# Patient Record
Sex: Male | Born: 1955 | Race: White | Hispanic: No
Health system: Southern US, Community
[De-identification: ages and names within clinical notes are randomized; demographics above are authoritative.]

## PROBLEM LIST (undated history)

## (undated) DIAGNOSIS — N189 Chronic kidney disease, unspecified: Secondary | ICD-10-CM

## (undated) DIAGNOSIS — I10 Essential (primary) hypertension: Secondary | ICD-10-CM

## (undated) DIAGNOSIS — I8390 Asymptomatic varicose veins of unspecified lower extremity: Secondary | ICD-10-CM

## (undated) DIAGNOSIS — N529 Male erectile dysfunction, unspecified: Secondary | ICD-10-CM

## (undated) DIAGNOSIS — I2510 Atherosclerotic heart disease of native coronary artery without angina pectoris: Secondary | ICD-10-CM

## (undated) DIAGNOSIS — I219 Acute myocardial infarction, unspecified: Secondary | ICD-10-CM

## (undated) DIAGNOSIS — I214 Non-ST elevation (NSTEMI) myocardial infarction: Secondary | ICD-10-CM

## (undated) DIAGNOSIS — I5022 Chronic systolic (congestive) heart failure: Secondary | ICD-10-CM

## (undated) DIAGNOSIS — J40 Bronchitis, not specified as acute or chronic: Secondary | ICD-10-CM

## (undated) DIAGNOSIS — H9190 Unspecified hearing loss, unspecified ear: Secondary | ICD-10-CM

## (undated) DIAGNOSIS — I499 Cardiac arrhythmia, unspecified: Secondary | ICD-10-CM

## (undated) DIAGNOSIS — E7800 Pure hypercholesterolemia, unspecified: Secondary | ICD-10-CM

## (undated) DIAGNOSIS — Z87442 Personal history of urinary calculi: Secondary | ICD-10-CM

## (undated) DIAGNOSIS — Z720 Tobacco use: Secondary | ICD-10-CM

## (undated) DIAGNOSIS — G4730 Sleep apnea, unspecified: Secondary | ICD-10-CM

## (undated) DIAGNOSIS — I255 Ischemic cardiomyopathy: Secondary | ICD-10-CM

## (undated) DIAGNOSIS — N200 Calculus of kidney: Secondary | ICD-10-CM

## (undated) HISTORY — PX: CORONARY STENT PLACEMENT: SHX1402

## (undated) HISTORY — PX: CORONARY ANGIOPLASTY: SHX604

---

## 2004-05-15 ENCOUNTER — Emergency Department: Payer: Self-pay | Admitting: Emergency Medicine

## 2004-05-16 ENCOUNTER — Emergency Department: Payer: Self-pay | Admitting: Emergency Medicine

## 2004-12-04 ENCOUNTER — Ambulatory Visit: Payer: Self-pay

## 2007-03-01 ENCOUNTER — Ambulatory Visit: Payer: Self-pay | Admitting: Internal Medicine

## 2007-03-02 ENCOUNTER — Emergency Department: Payer: Self-pay | Admitting: Emergency Medicine

## 2007-07-03 ENCOUNTER — Ambulatory Visit: Payer: Self-pay | Admitting: Family Medicine

## 2008-06-12 ENCOUNTER — Ambulatory Visit: Payer: Self-pay | Admitting: Family Medicine

## 2009-08-05 ENCOUNTER — Ambulatory Visit: Payer: Self-pay | Admitting: Internal Medicine

## 2011-07-02 DIAGNOSIS — I2510 Atherosclerotic heart disease of native coronary artery without angina pectoris: Secondary | ICD-10-CM

## 2011-07-02 HISTORY — DX: Atherosclerotic heart disease of native coronary artery without angina pectoris: I25.10

## 2012-06-19 ENCOUNTER — Encounter (HOSPITAL_COMMUNITY): Payer: Self-pay | Admitting: Pulmonary Disease

## 2012-06-19 ENCOUNTER — Emergency Department (HOSPITAL_COMMUNITY): Payer: BC Managed Care – PPO

## 2012-06-19 ENCOUNTER — Encounter (HOSPITAL_COMMUNITY)
Admission: EM | Disposition: A | Discharge status: Home / Self Care | Payer: Self-pay | Source: Home / Self Care | Attending: Pulmonary Disease

## 2012-06-19 ENCOUNTER — Inpatient Hospital Stay (HOSPITAL_COMMUNITY)
Admission: EM | Admit: 2012-06-19 | Discharge: 2012-06-28 | DRG: 549 | Disposition: A | Discharge status: Home / Self Care | Payer: BC Managed Care – PPO | Attending: Interventional Cardiology | Admitting: Interventional Cardiology

## 2012-06-19 DIAGNOSIS — I8390 Asymptomatic varicose veins of unspecified lower extremity: Secondary | ICD-10-CM | POA: Diagnosis present

## 2012-06-19 DIAGNOSIS — Z720 Tobacco use: Secondary | ICD-10-CM

## 2012-06-19 DIAGNOSIS — F17200 Nicotine dependence, unspecified, uncomplicated: Secondary | ICD-10-CM

## 2012-06-19 DIAGNOSIS — I469 Cardiac arrest, cause unspecified: Secondary | ICD-10-CM | POA: Diagnosis present

## 2012-06-19 DIAGNOSIS — I252 Old myocardial infarction: Secondary | ICD-10-CM | POA: Diagnosis present

## 2012-06-19 DIAGNOSIS — I5021 Acute systolic (congestive) heart failure: Secondary | ICD-10-CM

## 2012-06-19 DIAGNOSIS — I255 Ischemic cardiomyopathy: Secondary | ICD-10-CM

## 2012-06-19 DIAGNOSIS — J9600 Acute respiratory failure, unspecified whether with hypoxia or hypercapnia: Secondary | ICD-10-CM | POA: Diagnosis present

## 2012-06-19 DIAGNOSIS — Z955 Presence of coronary angioplasty implant and graft: Secondary | ICD-10-CM

## 2012-06-19 DIAGNOSIS — I5022 Chronic systolic (congestive) heart failure: Secondary | ICD-10-CM | POA: Diagnosis present

## 2012-06-19 DIAGNOSIS — I219 Acute myocardial infarction, unspecified: Secondary | ICD-10-CM

## 2012-06-19 DIAGNOSIS — R570 Cardiogenic shock: Secondary | ICD-10-CM

## 2012-06-19 DIAGNOSIS — I4901 Ventricular fibrillation: Secondary | ICD-10-CM

## 2012-06-19 DIAGNOSIS — D72829 Elevated white blood cell count, unspecified: Secondary | ICD-10-CM

## 2012-06-19 DIAGNOSIS — I2589 Other forms of chronic ischemic heart disease: Secondary | ICD-10-CM | POA: Diagnosis present

## 2012-06-19 DIAGNOSIS — G931 Anoxic brain damage, not elsewhere classified: Secondary | ICD-10-CM | POA: Diagnosis present

## 2012-06-19 DIAGNOSIS — I420 Dilated cardiomyopathy: Secondary | ICD-10-CM

## 2012-06-19 DIAGNOSIS — I213 ST elevation (STEMI) myocardial infarction of unspecified site: Secondary | ICD-10-CM

## 2012-06-19 DIAGNOSIS — G9340 Encephalopathy, unspecified: Secondary | ICD-10-CM

## 2012-06-19 DIAGNOSIS — J209 Acute bronchitis, unspecified: Secondary | ICD-10-CM | POA: Diagnosis present

## 2012-06-19 DIAGNOSIS — D696 Thrombocytopenia, unspecified: Secondary | ICD-10-CM | POA: Diagnosis present

## 2012-06-19 DIAGNOSIS — E876 Hypokalemia: Secondary | ICD-10-CM | POA: Diagnosis not present

## 2012-06-19 DIAGNOSIS — I2109 ST elevation (STEMI) myocardial infarction involving other coronary artery of anterior wall: Secondary | ICD-10-CM

## 2012-06-19 HISTORY — DX: Acute myocardial infarction, unspecified: I21.9

## 2012-06-19 HISTORY — PX: LEFT HEART CATHETERIZATION WITH CORONARY ANGIOGRAM: SHX5451

## 2012-06-19 HISTORY — PX: PERCUTANEOUS CORONARY STENT INTERVENTION (PCI-S): SHX5485

## 2012-06-19 HISTORY — DX: Asymptomatic varicose veins of unspecified lower extremity: I83.90

## 2012-06-19 LAB — BASIC METABOLIC PANEL
BUN: 18 mg/dL (ref 6–23)
BUN: 16 mg/dL (ref 6–23)
BUN: 16 mg/dL (ref 6–23)
BUN: 17 mg/dL (ref 6–23)
BUN: 17 mg/dL (ref 6–23)
BUN: 19 mg/dL (ref 6–23)
BUN: 19 mg/dL (ref 6–23)
CO2: 22 mEq/L (ref 19–32)
CO2: 22 mEq/L (ref 19–32)
CO2: 22 mEq/L (ref 19–32)
CO2: 19 mEq/L (ref 19–32)
CO2: 21 mEq/L (ref 19–32)
CO2: 22 mEq/L (ref 19–32)
CO2: 22 mEq/L (ref 19–32)
Calcium: 8.4 mg/dL (ref 8.4–10.5)
Calcium: 8.4 mg/dL (ref 8.4–10.5)
Calcium: 8.5 mg/dL (ref 8.4–10.5)
Calcium: 8.4 mg/dL (ref 8.4–10.5)
Calcium: 8.2 mg/dL — ABNORMAL LOW (ref 8.4–10.5)
Calcium: 8.8 mg/dL (ref 8.4–10.5)
Calcium: 8.6 mg/dL (ref 8.4–10.5)
Chloride: 104 mEq/L (ref 96–112)
Chloride: 106 mEq/L (ref 96–112)
Chloride: 107 mEq/L (ref 96–112)
Chloride: 106 mEq/L (ref 96–112)
Chloride: 105 mEq/L (ref 96–112)
Chloride: 106 mEq/L (ref 96–112)
Chloride: 108 mEq/L (ref 96–112)
Creatinine, Ser: 1.01 mg/dL (ref 0.50–1.35)
Creatinine, Ser: 0.85 mg/dL (ref 0.50–1.35)
Creatinine, Ser: 0.81 mg/dL (ref 0.50–1.35)
Creatinine, Ser: 0.87 mg/dL (ref 0.50–1.35)
Creatinine, Ser: 0.89 mg/dL (ref 0.50–1.35)
Creatinine, Ser: 0.99 mg/dL (ref 0.50–1.35)
Creatinine, Ser: 0.97 mg/dL (ref 0.50–1.35)
GFR calc Af Amer: >90 mL/min (ref >90)
GFR calc Af Amer: >90 mL/min (ref >90)
GFR calc Af Amer: >90 mL/min (ref >90)
GFR calc Af Amer: >90 mL/min (ref >90)
GFR calc Af Amer: >90 mL/min (ref >90)
GFR calc Af Amer: >90 mL/min (ref >90)
GFR calc Af Amer: >90 mL/min (ref >90)
GFR calc non Af Amer: 82 mL/min — ABNORMAL LOW (ref >90)
GFR calc non Af Amer: >90 mL/min (ref >90)
GFR calc non Af Amer: >90 mL/min (ref >90)
GFR calc non Af Amer: >90 mL/min (ref >90)
GFR calc non Af Amer: >90 mL/min (ref >90)
GFR calc non Af Amer: >90 mL/min (ref >90)
GFR calc non Af Amer: >90 mL/min (ref >90)
Glucose, Bld: 221 mg/dL — ABNORMAL HIGH (ref 70–99)
Glucose, Bld: 169 mg/dL — ABNORMAL HIGH (ref 70–99)
Glucose, Bld: 165 mg/dL — ABNORMAL HIGH (ref 70–99)
Glucose, Bld: 163 mg/dL — ABNORMAL HIGH (ref 70–99)
Glucose, Bld: 203 mg/dL — ABNORMAL HIGH (ref 70–99)
Glucose, Bld: 193 mg/dL — ABNORMAL HIGH (ref 70–99)
Glucose, Bld: 168 mg/dL — ABNORMAL HIGH (ref 70–99)
Potassium: 3.3 mEq/L — ABNORMAL LOW (ref 3.5–5.1)
Potassium: 3.7 mEq/L (ref 3.5–5.1)
Potassium: 4.1 mEq/L (ref 3.5–5.1)
Potassium: 5.1 mEq/L (ref 3.5–5.1)
Potassium: 3.7 mEq/L (ref 3.5–5.1)
Potassium: 3.5 mEq/L (ref 3.5–5.1)
Potassium: 3.4 mEq/L — ABNORMAL LOW (ref 3.5–5.1)
Sodium: 141 mEq/L (ref 135–145)
Sodium: 142 mEq/L (ref 135–145)
Sodium: 141 mEq/L (ref 135–145)
Sodium: 139 mEq/L (ref 135–145)
Sodium: 141 mEq/L (ref 135–145)
Sodium: 140 mEq/L (ref 135–145)
Sodium: 142 mEq/L (ref 135–145)

## 2012-06-19 LAB — COMPREHENSIVE METABOLIC PANEL
ALT: 81 U/L — ABNORMAL HIGH (ref 0–53)
AST: 94 U/L — ABNORMAL HIGH (ref 0–37)
Albumin: 3.9 g/dL (ref 3.5–5.2)
Alkaline Phosphatase: 82 U/L (ref 39–117)
BUN: 18 mg/dL (ref 6–23)
CO2: 18 mEq/L — ABNORMAL LOW (ref 19–32)
Calcium: 9.6 mg/dL (ref 8.4–10.5)
Chloride: 99 mEq/L (ref 96–112)
Creatinine, Ser: 1.09 mg/dL (ref 0.50–1.35)
GFR calc Af Amer: 87 mL/min — ABNORMAL LOW (ref >90)
GFR calc non Af Amer: 75 mL/min — ABNORMAL LOW (ref >90)
Glucose, Bld: 225 mg/dL — ABNORMAL HIGH (ref 70–99)
Potassium: 2.1 mEq/L — CL (ref 3.5–5.1)
Sodium: 141 mEq/L (ref 135–145)
Total Bilirubin: 0.3 mg/dL (ref 0.3–1.2)
Total Protein: 6.8 g/dL (ref 6.0–8.3)

## 2012-06-19 LAB — POCT I-STAT 3, ART BLOOD GAS (G3+)
Acid-base deficit: 12 mmol/L — ABNORMAL HIGH (ref 0.0–2.0)
Bicarbonate: 18 mEq/L — ABNORMAL LOW (ref 20.0–24.0)
O2 Saturation: 97 %
TCO2: 20 mmol/L (ref 0–100)
pCO2 arterial: 53.5 mmHg — ABNORMAL HIGH (ref 35.0–45.0)
pH, Arterial: 7.134 — CL (ref 7.350–7.450)
pO2, Arterial: 117 mmHg — ABNORMAL HIGH (ref 80.0–100.0)

## 2012-06-19 LAB — GLUCOSE, CAPILLARY
Glucose-Capillary: 155 mg/dL — ABNORMAL HIGH (ref 70–99)
Glucose-Capillary: 157 mg/dL — ABNORMAL HIGH (ref 70–99)
Glucose-Capillary: 158 mg/dL — ABNORMAL HIGH (ref 70–99)
Glucose-Capillary: 159 mg/dL — ABNORMAL HIGH (ref 70–99)
Glucose-Capillary: 163 mg/dL — ABNORMAL HIGH (ref 70–99)
Glucose-Capillary: 165 mg/dL — ABNORMAL HIGH (ref 70–99)
Glucose-Capillary: 169 mg/dL — ABNORMAL HIGH (ref 70–99)
Glucose-Capillary: 177 mg/dL — ABNORMAL HIGH (ref 70–99)
Glucose-Capillary: 179 mg/dL — ABNORMAL HIGH (ref 70–99)
Glucose-Capillary: 184 mg/dL — ABNORMAL HIGH (ref 70–99)
Glucose-Capillary: 189 mg/dL — ABNORMAL HIGH (ref 70–99)
Glucose-Capillary: 201 mg/dL — ABNORMAL HIGH (ref 70–99)
Glucose-Capillary: 204 mg/dL — ABNORMAL HIGH (ref 70–99)

## 2012-06-19 LAB — CBC WITH DIFFERENTIAL/PLATELET
Basophils Absolute: 0.2 10*3/uL — ABNORMAL HIGH (ref 0.0–0.1)
Basophils Relative: 1 % (ref 0–1)
Eosinophils Absolute: 0.2 10*3/uL (ref 0.0–0.7)
Eosinophils Relative: 1 % (ref 0–5)
HCT: 44.5 % (ref 39.0–52.0)
Hemoglobin: 15.2 g/dL (ref 13.0–17.0)
Lymphocytes Relative: 28 % (ref 12–46)
Lymphs Abs: 6.7 10*3/uL — ABNORMAL HIGH (ref 0.7–4.0)
MCH: 32.1 pg (ref 26.0–34.0)
MCHC: 34.2 g/dL (ref 30.0–36.0)
MCV: 93.9 fL (ref 78.0–100.0)
Monocytes Absolute: 1 10*3/uL (ref 0.1–1.0)
Monocytes Relative: 4 % (ref 3–12)
Neutro Abs: 15.9 10*3/uL — ABNORMAL HIGH (ref 1.7–7.7)
Neutrophils Relative %: 66 % (ref 43–77)
Platelets: 231 10*3/uL (ref 150–400)
RBC: 4.74 MIL/uL (ref 4.22–5.81)
RDW: 13.3 % (ref 11.5–15.5)
WBC: 24 10*3/uL — ABNORMAL HIGH (ref 4.0–10.5)

## 2012-06-19 LAB — CBC
HCT: 45.6 % (ref 39.0–52.0)
Hemoglobin: 16 g/dL (ref 13.0–17.0)
MCH: 32.9 pg (ref 26.0–34.0)
MCHC: 35.1 g/dL (ref 30.0–36.0)
MCV: 93.6 fL (ref 78.0–100.0)
Platelets: 225 10*3/uL (ref 150–400)
RBC: 4.87 MIL/uL (ref 4.22–5.81)
RDW: 13.9 % (ref 11.5–15.5)
WBC: 23.8 10*3/uL — ABNORMAL HIGH (ref 4.0–10.5)

## 2012-06-19 LAB — PROTIME-INR
INR: 0.99 (ref 0.00–1.49)
INR: 1.79 — ABNORMAL HIGH (ref 0.00–1.49)
INR: 1.06 (ref 0.00–1.49)
Prothrombin Time: 13 seconds (ref 11.6–15.2)
Prothrombin Time: 20.2 seconds — ABNORMAL HIGH (ref 11.6–15.2)
Prothrombin Time: 13.7 seconds (ref 11.6–15.2)

## 2012-06-19 LAB — POCT I-STAT, CHEM 8
BUN: 23 mg/dL (ref 6–23)
BUN: 19 mg/dL (ref 6–23)
Calcium, Ion: 1.16 mmol/L (ref 1.12–1.23)
Calcium, Ion: 1.08 mmol/L — ABNORMAL LOW (ref 1.12–1.23)
Chloride: 106 mEq/L (ref 96–112)
Chloride: 90 mEq/L — ABNORMAL LOW (ref 96–112)
Creatinine, Ser: 1.1 mg/dL (ref 0.50–1.35)
Creatinine, Ser: 0.7 mg/dL (ref 0.50–1.35)
Glucose, Bld: 204 mg/dL — ABNORMAL HIGH (ref 70–99)
Glucose, Bld: 194 mg/dL — ABNORMAL HIGH (ref 70–99)
HCT: 47 % (ref 39.0–52.0)
HCT: 38 % — ABNORMAL LOW (ref 39.0–52.0)
Hemoglobin: 16 g/dL (ref 13.0–17.0)
Hemoglobin: 12.9 g/dL — ABNORMAL LOW (ref 13.0–17.0)
Potassium: 2.6 mEq/L — CL (ref 3.5–5.1)
Potassium: 2.3 mEq/L — CL (ref 3.5–5.1)
Sodium: 143 mEq/L (ref 135–145)
Sodium: 126 mEq/L — ABNORMAL LOW (ref 135–145)
TCO2: 21 mmol/L (ref 0–100)
TCO2: 18 mmol/L (ref 0–100)

## 2012-06-19 LAB — HEPARIN LEVEL (UNFRACTIONATED)
Heparin Unfractionated: <0.1 IU/mL — ABNORMAL LOW (ref 0.30–0.70)
Heparin Unfractionated: 0.16 IU/mL — ABNORMAL LOW (ref 0.30–0.70)

## 2012-06-19 LAB — APTT
aPTT: 34 seconds (ref 24–37)
aPTT: 106 seconds — ABNORMAL HIGH (ref 24–37)
aPTT: 58 seconds — ABNORMAL HIGH (ref 24–37)

## 2012-06-19 LAB — CK TOTAL AND CKMB (NOT AT ARMC)
CK, MB: 32.3 ng/mL (ref 0.3–4.0)
Relative Index: 7.8 — ABNORMAL HIGH (ref 0.0–2.5)
Total CK: 416 U/L — ABNORMAL HIGH (ref 7–232)

## 2012-06-19 LAB — INFLUENZA PANEL BY PCR (TYPE A & B)
H1N1 flu by pcr: NOT DETECTED
Influenza A By PCR: NEGATIVE
Influenza B By PCR: NEGATIVE

## 2012-06-19 LAB — POCT I-STAT TROPONIN I: Troponin i, poc: 1.02 ng/mL (ref 0.00–0.08)

## 2012-06-19 LAB — PLATELET INHIBITION P2Y12: Platelet Function  P2Y12: 204 [PRU] (ref 194–418)

## 2012-06-19 LAB — MRSA PCR SCREENING: MRSA by PCR: NEGATIVE

## 2012-06-19 LAB — PATHOLOGIST SMEAR REVIEW

## 2012-06-19 LAB — POCT ACTIVATED CLOTTING TIME: Activated Clotting Time: 540 seconds

## 2012-06-19 LAB — TROPONIN I: Troponin I: 3.15 ng/mL (ref <0.30)

## 2012-06-19 SURGERY — LEFT HEART CATHETERIZATION WITH CORONARY ANGIOGRAM
Anesthesia: LOCAL | Site: Groin | Laterality: Right

## 2012-06-19 MED ORDER — ONDANSETRON HCL 4 MG/2ML IJ SOLN: 4.0000 mg | Freq: Four times a day (QID) | INTRAMUSCULAR | Status: DC | PRN

## 2012-06-19 MED ORDER — LIDOCAINE HCL (PF) 1 % IJ SOLN
INTRAMUSCULAR | Status: AC
  Filled 2012-06-19: qty 30

## 2012-06-19 MED ORDER — HEPARIN (PORCINE) IN NACL 2-0.9 UNIT/ML-% IJ SOLN
INTRAMUSCULAR | Status: AC
  Filled 2012-06-19: qty 500

## 2012-06-19 MED ORDER — FENTANYL BOLUS VIA INFUSION
50.0000 ug | INTRAVENOUS | Status: DC | PRN
  Administered 2012-06-19: 50 ug via INTRAVENOUS
  Administered 2012-06-19: 100 ug via INTRAVENOUS
  Filled 2012-06-19: qty 50

## 2012-06-19 MED ORDER — SODIUM CHLORIDE 0.9 % IV SOLN: 2000.0000 mL | Freq: Once | INTRAVENOUS | Status: DC

## 2012-06-19 MED ORDER — FENTANYL BOLUS VIA INFUSION
50.0000 ug | INTRAVENOUS | Status: DC | PRN
  Filled 2012-06-19: qty 50

## 2012-06-19 MED ORDER — SODIUM BICARBONATE 8.4 % IV SOLN
INTRAVENOUS | Status: AC
  Filled 2012-06-19: qty 50

## 2012-06-19 MED ORDER — INSULIN REGULAR HUMAN 100 UNIT/ML IJ SOLN
INTRAMUSCULAR | Status: DC
  Filled 2012-06-19: qty 1

## 2012-06-19 MED ORDER — ASPIRIN 81 MG PO CHEW
CHEWABLE_TABLET | ORAL | Status: AC
  Filled 2012-06-19: qty 4

## 2012-06-19 MED ORDER — SODIUM CHLORIDE 0.9 % IV SOLN
1.0000 mg/h | INTRAVENOUS | Status: DC
  Administered 2012-06-19: 2 mg/h via INTRAVENOUS
  Filled 2012-06-19: qty 10

## 2012-06-19 MED ORDER — LIDOCAINE HCL (CARDIAC) 20 MG/ML IV SOLN
INTRAVENOUS | Status: AC
  Filled 2012-06-19: qty 5

## 2012-06-19 MED ORDER — FENTANYL CITRATE 0.05 MG/ML IJ SOLN
100.0000 ug | Freq: Once | INTRAMUSCULAR | Status: AC
  Administered 2012-06-19: 100 ug via INTRAVENOUS
  Filled 2012-06-19: qty 2

## 2012-06-19 MED ORDER — HEPARIN SODIUM (PORCINE) 5000 UNIT/ML IJ SOLN
4000.0000 [IU] | Freq: Once | INTRAMUSCULAR | Status: AC
  Administered 2012-06-19: 4000 [IU] via INTRAVENOUS
  Filled 2012-06-19: qty 1

## 2012-06-19 MED ORDER — POTASSIUM CHLORIDE 10 MEQ/50ML IV SOLN
10.0000 meq | INTRAVENOUS | Status: AC
  Administered 2012-06-19 (×2): 10 meq via INTRAVENOUS
  Filled 2012-06-19: qty 100
  Filled 2012-06-19 (×3): qty 50

## 2012-06-19 MED ORDER — SUCCINYLCHOLINE CHLORIDE 20 MG/ML IJ SOLN
INTRAMUSCULAR | Status: AC
  Filled 2012-06-19: qty 1

## 2012-06-19 MED ORDER — NITROGLYCERIN 0.2 MG/ML ON CALL CATH LAB
INTRAVENOUS | Status: AC
  Filled 2012-06-19: qty 1

## 2012-06-19 MED ORDER — AMIODARONE HCL 150 MG/3ML IV SOLN
150.0000 mg | Freq: Once | INTRAVENOUS | Status: AC
  Administered 2012-06-19: 150 mg via INTRAVENOUS
  Filled 2012-06-19: qty 3

## 2012-06-19 MED ORDER — ETOMIDATE 2 MG/ML IV SOLN
INTRAVENOUS | Status: AC | PRN
  Administered 2012-06-19: 20 mg via INTRAVENOUS

## 2012-06-19 MED ORDER — INSULIN ASPART 100 UNIT/ML ~~LOC~~ SOLN
2.0000 [IU] | SUBCUTANEOUS | Status: DC
  Administered 2012-06-19 (×4): 4 [IU] via SUBCUTANEOUS
  Administered 2012-06-20 – 2012-06-22 (×4): 2 [IU] via SUBCUTANEOUS

## 2012-06-19 MED ORDER — BIVALIRUDIN 250 MG IV SOLR
INTRAVENOUS | Status: AC
  Filled 2012-06-19: qty 250

## 2012-06-19 MED ORDER — CISATRACURIUM BOLUS VIA INFUSION
0.0500 mg/kg | Freq: Once | INTRAVENOUS | Status: AC | PRN
  Filled 2012-06-19: qty 4

## 2012-06-19 MED ORDER — ARTIFICIAL TEARS OP OINT
1.0000 "application " | TOPICAL_OINTMENT | Freq: Three times a day (TID) | OPHTHALMIC | Status: DC
  Filled 2012-06-19: qty 3.5

## 2012-06-19 MED ORDER — NOREPINEPHRINE BITARTRATE 1 MG/ML IJ SOLN: 2.0000 ug/min | INTRAVENOUS | Status: DC

## 2012-06-19 MED ORDER — MIDAZOLAM BOLUS VIA INFUSION
2.0000 mg | INTRAVENOUS | Status: DC | PRN
  Filled 2012-06-19: qty 2

## 2012-06-19 MED ORDER — METOPROLOL TARTRATE 12.5 MG HALF TABLET
12.5000 mg | ORAL_TABLET | Freq: Two times a day (BID) | ORAL | Status: DC
  Administered 2012-06-19: 12.5 mg via NASOGASTRIC
  Filled 2012-06-19 (×4): qty 1

## 2012-06-19 MED ORDER — FENTANYL CITRATE 0.05 MG/ML IJ SOLN: 100.0000 ug | INTRAMUSCULAR | Status: DC | PRN

## 2012-06-19 MED ORDER — POTASSIUM CHLORIDE 10 MEQ/50ML IV SOLN
10.0000 meq | INTRAVENOUS | Status: AC
  Administered 2012-06-19 – 2012-06-20 (×3): 10 meq via INTRAVENOUS
  Filled 2012-06-19: qty 100

## 2012-06-19 MED ORDER — EPTIFIBATIDE BOLUS VIA INFUSION
180.0000 ug/kg | Freq: Once | INTRAVENOUS | Status: AC
  Administered 2012-06-19: 16100 ug via INTRAVENOUS
  Filled 2012-06-19: qty 22

## 2012-06-19 MED ORDER — CLOPIDOGREL BISULFATE 75 MG PO TABS
75.0000 mg | ORAL_TABLET | Freq: Every day | ORAL | Status: DC
  Administered 2012-06-20 – 2012-06-28 (×9): 75 mg via ORAL
  Filled 2012-06-19 (×12): qty 1

## 2012-06-19 MED ORDER — CHLORHEXIDINE GLUCONATE 0.12 % MT SOLN
15.0000 mL | Freq: Two times a day (BID) | OROMUCOSAL | Status: DC
  Administered 2012-06-19 – 2012-06-22 (×8): 15 mL via OROMUCOSAL
  Filled 2012-06-19 (×7): qty 15

## 2012-06-19 MED ORDER — MIDAZOLAM HCL 5 MG/ML IJ SOLN: 2.0000 mg | Freq: Once | INTRAMUSCULAR | Status: DC

## 2012-06-19 MED ORDER — SODIUM CHLORIDE 0.9 % IV SOLN
0.2500 mg/kg/h | INTRAVENOUS | Status: AC
  Administered 2012-06-19: 0.25 mg/kg/h via INTRAVENOUS
  Filled 2012-06-19 (×2): qty 250

## 2012-06-19 MED ORDER — FENTANYL CITRATE 0.05 MG/ML IJ SOLN: 100.0000 ug | Freq: Once | INTRAMUSCULAR | Status: DC

## 2012-06-19 MED ORDER — IPRATROPIUM-ALBUTEROL 18-103 MCG/ACT IN AERO
6.0000 | INHALATION_SPRAY | RESPIRATORY_TRACT | Status: DC
  Administered 2012-06-19 – 2012-06-23 (×22): 6 via RESPIRATORY_TRACT
  Filled 2012-06-19: qty 14.7

## 2012-06-19 MED ORDER — EPTIFIBATIDE 75 MG/100ML IV SOLN
2.0000 ug/kg/min | INTRAVENOUS | Status: DC
  Administered 2012-06-19: 2 ug/kg/min via INTRAVENOUS
  Filled 2012-06-19: qty 100

## 2012-06-19 MED ORDER — SODIUM CHLORIDE 0.9 % IV SOLN
INTRAVENOUS | Status: AC
  Administered 2012-06-19: 23:00:00 via INTRAVENOUS

## 2012-06-19 MED ORDER — DOPAMINE-DEXTROSE 3.2-5 MG/ML-% IV SOLN
5.0000 ug/kg/min | INTRAVENOUS | Status: DC
  Administered 2012-06-19: 2 ug/kg/min via INTRAVENOUS
  Administered 2012-06-19: 4 ug/kg/min via INTRAVENOUS
  Administered 2012-06-19 – 2012-06-20 (×3): 2 ug/kg/min via INTRAVENOUS

## 2012-06-19 MED ORDER — SODIUM CHLORIDE 0.9 % IV SOLN
1.0000 ug/kg/min | INTRAVENOUS | Status: DC
  Administered 2012-06-19: 0.5 ug/kg/min via INTRAVENOUS
  Filled 2012-06-19 (×2): qty 20

## 2012-06-19 MED ORDER — ROCURONIUM BROMIDE 50 MG/5ML IV SOLN
INTRAVENOUS | Status: AC
  Filled 2012-06-19: qty 2

## 2012-06-19 MED ORDER — MIDAZOLAM HCL 5 MG/ML IJ SOLN: 2.0000 mg | Freq: Once | INTRAMUSCULAR | Status: DC | PRN

## 2012-06-19 MED ORDER — NOREPINEPHRINE BITARTRATE 1 MG/ML IJ SOLN
0.5000 ug/min | INTRAVENOUS | Status: DC
  Administered 2012-06-19: 30 ug/min via INTRAVENOUS
  Administered 2012-06-19: 26 ug/min via INTRAVENOUS
  Administered 2012-06-20 – 2012-06-21 (×4): 30 ug/min via INTRAVENOUS
  Administered 2012-06-21: 25 ug/min via INTRAVENOUS
  Administered 2012-06-21: 34 ug/min via INTRAVENOUS
  Administered 2012-06-22: 18 ug/min via INTRAVENOUS
  Filled 2012-06-19 (×10): qty 16

## 2012-06-19 MED ORDER — ETOMIDATE 2 MG/ML IV SOLN
INTRAVENOUS | Status: AC
  Filled 2012-06-19: qty 20

## 2012-06-19 MED ORDER — EPINEPHRINE HCL 0.1 MG/ML IJ SOLN
INTRAMUSCULAR | Status: AC | PRN
  Administered 2012-06-19 (×2): 1 via INTRAVENOUS

## 2012-06-19 MED ORDER — CISATRACURIUM BOLUS VIA INFUSION
0.1000 mg/kg | Freq: Once | INTRAVENOUS | Status: DC
  Filled 2012-06-19: qty 8

## 2012-06-19 MED ORDER — CISATRACURIUM BOLUS VIA INFUSION: 0.1000 mg/kg | Freq: Once | INTRAVENOUS | Status: DC

## 2012-06-19 MED ORDER — SODIUM CHLORIDE 0.9 % IV SOLN
25.0000 ug/h | INTRAVENOUS | Status: DC
  Filled 2012-06-19: qty 50

## 2012-06-19 MED ORDER — SODIUM CHLORIDE 0.9 % IV SOLN
1.0000 mg/h | INTRAVENOUS | Status: DC
  Administered 2012-06-19: 6 mg/h via INTRAVENOUS
  Administered 2012-06-19: 4 mg/h via INTRAVENOUS
  Administered 2012-06-19 (×2): 6 mg/h via INTRAVENOUS
  Filled 2012-06-19 (×7): qty 10

## 2012-06-19 MED ORDER — MIDAZOLAM HCL 2 MG/2ML IJ SOLN
2.0000 mg | Freq: Once | INTRAMUSCULAR | Status: AC
  Administered 2012-06-19: 2 mg via INTRAVENOUS

## 2012-06-19 MED ORDER — SUCCINYLCHOLINE CHLORIDE 20 MG/ML IJ SOLN
INTRAMUSCULAR | Status: AC | PRN
  Administered 2012-06-19: 120 mg via INTRAVENOUS

## 2012-06-19 MED ORDER — POTASSIUM CHLORIDE 10 MEQ/50ML IV SOLN
10.0000 meq | INTRAVENOUS | Status: AC
  Administered 2012-06-19: 10 meq via INTRAVENOUS
  Filled 2012-06-19 (×2): qty 50

## 2012-06-19 MED ORDER — DOPAMINE-DEXTROSE 3.2-5 MG/ML-% IV SOLN
INTRAVENOUS | Status: AC
  Filled 2012-06-19: qty 250

## 2012-06-19 MED ORDER — ASPIRIN EC 325 MG PO TBEC
325.0000 mg | DELAYED_RELEASE_TABLET | Freq: Every day | ORAL | Status: DC
  Administered 2012-06-20 – 2012-06-22 (×3): 325 mg via ORAL
  Filled 2012-06-19 (×4): qty 1

## 2012-06-19 MED ORDER — SODIUM CHLORIDE 0.9 % IV SOLN
2000.0000 mL | Freq: Once | INTRAVENOUS | Status: AC
  Administered 2012-06-19: 2000 mL via INTRAVENOUS

## 2012-06-19 MED ORDER — ASPIRIN 300 MG RE SUPP: 300.0000 mg | RECTAL | Status: AC

## 2012-06-19 MED ORDER — ACETAMINOPHEN 325 MG PO TABS
650.0000 mg | ORAL_TABLET | ORAL | Status: DC | PRN
  Administered 2012-06-21 (×2): 650 mg via ORAL
  Filled 2012-06-19 (×2): qty 2

## 2012-06-19 MED ORDER — BIOTENE DRY MOUTH MT LIQD
15.0000 mL | Freq: Four times a day (QID) | OROMUCOSAL | Status: DC
  Administered 2012-06-19 – 2012-06-24 (×19): 15 mL via OROMUCOSAL

## 2012-06-19 MED ORDER — MIDAZOLAM BOLUS VIA INFUSION
2.0000 mg | INTRAVENOUS | Status: DC | PRN
  Administered 2012-06-19 (×3): 2 mg via INTRAVENOUS
  Filled 2012-06-19 (×2): qty 2

## 2012-06-19 MED ORDER — ASPIRIN 300 MG RE SUPP: 300.0000 mg | RECTAL | Status: DC

## 2012-06-19 MED ORDER — FAMOTIDINE IN NACL 20-0.9 MG/50ML-% IV SOLN
20.0000 mg | Freq: Two times a day (BID) | INTRAVENOUS | Status: DC
  Administered 2012-06-19 – 2012-06-24 (×11): 20 mg via INTRAVENOUS
  Filled 2012-06-19 (×13): qty 50

## 2012-06-19 MED ORDER — HEPARIN (PORCINE) IN NACL 2-0.9 UNIT/ML-% IJ SOLN
INTRAMUSCULAR | Status: AC
  Filled 2012-06-19: qty 1000

## 2012-06-19 MED ORDER — AMIODARONE HCL 150 MG/3ML IV SOLN
INTRAVENOUS | Status: AC
  Filled 2012-06-19: qty 3

## 2012-06-19 MED ORDER — IPRATROPIUM-ALBUTEROL 18-103 MCG/ACT IN AERO: 6.0000 | INHALATION_SPRAY | RESPIRATORY_TRACT | Status: DC | PRN

## 2012-06-19 MED ORDER — CLOPIDOGREL BISULFATE 300 MG PO TABS
ORAL_TABLET | ORAL | Status: AC
  Filled 2012-06-19: qty 2

## 2012-06-19 MED ORDER — CISATRACURIUM BOLUS VIA INFUSION: 0.0500 mg/kg | Freq: Once | INTRAVENOUS | Status: DC | PRN

## 2012-06-19 MED ORDER — MIDAZOLAM HCL 2 MG/2ML IJ SOLN
INTRAMUSCULAR | Status: AC
  Filled 2012-06-19: qty 2

## 2012-06-19 MED ORDER — DEXTROSE 10 % IV SOLN: INTRAVENOUS | Status: DC | PRN

## 2012-06-19 MED ORDER — FENTANYL CITRATE 0.05 MG/ML IJ SOLN: 100.0000 ug | Freq: Once | INTRAMUSCULAR | Status: DC | PRN

## 2012-06-19 MED ORDER — SODIUM CHLORIDE 0.9 % IV SOLN
25.0000 ug/h | INTRAVENOUS | Status: DC
  Administered 2012-06-19: 50 ug/h via INTRAVENOUS
  Administered 2012-06-20 (×2): 200 ug/h via INTRAVENOUS
  Filled 2012-06-19 (×4): qty 50

## 2012-06-19 MED ORDER — ARTIFICIAL TEARS OP OINT
TOPICAL_OINTMENT | Freq: Three times a day (TID) | OPHTHALMIC | Status: DC
  Administered 2012-06-19: 13:00:00 via OPHTHALMIC
  Administered 2012-06-19: 1 via OPHTHALMIC
  Administered 2012-06-20 (×3): via OPHTHALMIC
  Filled 2012-06-19: qty 3.5

## 2012-06-19 MED ORDER — MIDAZOLAM HCL 2 MG/2ML IJ SOLN
INTRAMUSCULAR | Status: AC
  Administered 2012-06-19: 2 mg via INTRAVENOUS
  Filled 2012-06-19: qty 4

## 2012-06-19 MED ORDER — HEPARIN (PORCINE) IN NACL 100-0.45 UNIT/ML-% IJ SOLN
1100.0000 [IU]/h | INTRAMUSCULAR | Status: DC
  Administered 2012-06-19: 1000 [IU]/h via INTRAVENOUS
  Administered 2012-06-19: 800 [IU]/h via INTRAVENOUS
  Administered 2012-06-20: 1100 [IU]/h via INTRAVENOUS
  Filled 2012-06-19 (×4): qty 250

## 2012-06-19 MED ORDER — MIDAZOLAM HCL 2 MG/2ML IJ SOLN
2.0000 mg | Freq: Once | INTRAMUSCULAR | Status: AC | PRN
  Administered 2012-06-19: 2 mg via INTRAVENOUS

## 2012-06-19 MED ORDER — POTASSIUM CHLORIDE 20 MEQ/15ML (10%) PO LIQD
40.0000 meq | Freq: Once | ORAL | Status: AC
  Administered 2012-06-19: 40 meq
  Filled 2012-06-19: qty 30

## 2012-06-19 MED ORDER — SODIUM CHLORIDE 0.9 % IV SOLN: 1.0000 ug/kg/min | INTRAVENOUS | Status: DC

## 2012-06-19 NOTE — Progress Notes (Signed)
Pt unresponsive, no family available to obtain medical history at this time

## 2012-06-19 NOTE — Progress Notes (Signed)
ANTICOAGULATION CONSULT NOTE - Follow Up Consult  Pharmacy Consult for heparin Indication: STEMI/IABP  Labs:  Basename 06/19/12 2159 06/19/12 1825 06/19/12 1425 06/19/12 1200 06/19/12 0645 06/19/12 0505 06/19/12 0345 06/19/12 0339 06/19/12 0338  HGB 16.0 -- -- -- -- 12.9* -- -- --  HCT 45.6 -- -- -- -- 38.0* 47.0 -- --  PLT 225 -- -- -- -- -- -- -- 231  APTT -- -- -- 58* 106* -- -- -- 34  LABPROT -- -- -- 13.7 20.2* -- -- -- 13.0  INR -- -- -- 1.06 1.79* -- -- -- 0.99  HEPARINUNFRC 0.16* -- <0.10* -- -- -- -- -- --  CREATININE -- 0.99 0.89 0.87 -- -- -- -- --  CKTOTAL -- -- -- -- -- -- -- 416* --  CKMB -- -- -- -- -- -- -- 32.3* --  TROPONINI -- -- -- -- -- -- -- 3.15* --     Assessment: 56yo male remains subtherapeutic on heparin after rate increase with low goal due to IABP; noted that pt was bleeding steadily from groin which has resolved since Integrilin has been off; Hgb back up to baseline 16.  Goal of Therapy:  Heparin level 0.2-0.5 units/ml   Plan:  Will increase heparin gtt by 1 unit/kg/hr to 1100 units/hr and check level with am labs.  Colleen Can PharmD BCPS 06/19/2012,10:56 PM

## 2012-06-19 NOTE — Progress Notes (Signed)
ANTICOAGULATION CONSULT NOTE - Initial Consult  Pharmacy Consult for heparin Indication: STEMI s/p balloon pump insertion  No Known Allergies  Patient Measurements: Weight: 209 lb 7 oz (95 kg)  Vital Signs: Temp: 97.7 F (36.5 C) (12/20 0415) BP: 88/63 mmHg (12/20 0415) Pulse Rate: 104  (12/20 0415)  Labs:  Basename 06/19/12 0345 06/19/12 0339 06/19/12 0338  HGB 16.0 -- 15.2  HCT 47.0 -- 44.5  PLT -- -- 231  APTT -- -- 34  LABPROT -- -- 13.0  INR -- -- 0.99  HEPARINUNFRC -- -- --  CREATININE 1.10 -- 1.09  CKTOTAL -- 416* --  CKMB -- 32.3* --  TROPONINI -- 3.15* --    Medical History: Past Medical History  Diagnosis Date  . Varicose veins     Assessment: 56yo male c/o CP x1d, called EMS overnight, then had witnessed Vfib arrest with EMS present requiring shocks and CPR, STEMI seen prior to arrest, sent to cath lab after stabilized and started on hypothermia protocol in ED, balloon pump placed, to begin heparin after Angiomax gtt completed.  Goal of Therapy:  Heparin level 0.2-0.5 units/ml Monitor platelets by anticoagulation protocol: Yes   Plan:  Will begin heparin gtt at 800 units/hr and monitor heparin levels and CBC.  Colleen Can PharmD BCPS 06/19/2012,6:17 AM

## 2012-06-19 NOTE — Progress Notes (Signed)
ANTICOAGULATION CONSULT NOTE - Follow Up Consult  Pharmacy Consult for Heparin / Integrilin  Indication: STEMI / hypothermia / balloon pump  No Known Allergies  Patient Measurements: Height: 5\' 9"  (175.3 cm) Weight: 196 lb 13.9 oz (89.3 kg) IBW/kg (Calculated) : 70.7    Vital Signs: Temp: 92.3 F (33.5 C) (12/20 1500) BP: 97/72 mmHg (12/20 1500) Pulse Rate: 69  (12/20 1500)  Labs:  Basename 06/19/12 1200 06/19/12 1019 06/19/12 0841 06/19/12 0645 06/19/12 0345 06/19/12 0339 06/19/12 0338  HGB -- -- -- -- 16.0 -- 15.2  HCT -- -- -- -- 47.0 -- 44.5  PLT -- -- -- -- -- -- 231  APTT 58* -- -- 106* -- -- 34  LABPROT 13.7 -- -- 20.2* -- -- 13.0  INR 1.06 -- -- 1.79* -- -- 0.99  HEPARINUNFRC -- -- -- -- -- -- --  CREATININE 0.87 0.81 0.85 -- -- -- --  CKTOTAL -- -- -- -- -- 416* --  CKMB -- -- -- -- -- 32.3* --  TROPONINI -- -- -- -- -- 3.15* --    Estimated Creatinine Clearance: 106 ml/min (by C-G formula based on Cr of 0.87).   Assessment: 56yom with 1 day hx CP EMS called then had witnessed VT arrest - now hypothermia protocol - to begin rewarming 12/21 at 1100.  STEMI had thrombus extraction and  BMS in LAD. Concern for clot burden so will begin Integrilin bolus and drip.   Heparin drip to prevent clotting with balloon pump placed in cath lab.   Heparin drip 800 uts/hr, heparin level < 0.1 less than goal, no bleeding noted, last cbc stable. Goal of Therapy:  Heparin level 0.3-0.5 units/ml while on Integrilin Monitor platelets by anticoagulation protocol: Yes   Plan:  Increase heparin drip 1000 uts/hr  check 6hr heparin level and daily Begin Integrilin single bolus 117mcg/kg iv x1 then  48mcg/kg/min  Check cbc 6hr after start and daily  Marcelino Scot 06/19/2012,3:42 PM

## 2012-06-19 NOTE — ED Provider Notes (Signed)
History     CSN: [id number]  Arrival date & time 06/19/12  6213   First MD Initiated Contact with Patient 06/19/12 0407      Chief Complaint  Patient presents with  . Code STEMI  . Cardiac Arrest    (Consider location/radiation/quality/duration/timing/severity/associated sxs/prior treatment) HPI Comments: Mr. Tomasello presents via EMS for evaluation after a witnessed cardiac arrest.  At about 0230 he walked into the bedroom appearing pale and diaphoretic.  He complained of chest pain and then collapsed on the bed.  He was awake and still able to speak but appeared distressed.  EMS arrived he was able to speak to them initially.  They obtained an EKG that demonstrated anterior ST elevations with reciprocal inferior changes.  Prior to loading him into the ambulance, he collapsed and became pulseless.  CPR was initiated.  He is reported to have alternated between an irregular sinus rhythm with pulses, PEA, and ventricular fibrillation.  He was defibrillated 4 times and a king airway was placed.  He has no history of cardiac issues per his fiancee and family.  The history is provided by a relative and the EMS personnel. The history is limited by the condition of the patient (pt obtunded initially).    No past medical history on file.  No past surgical history on file.  No family history on file.  History  Substance Use Topics  . Smoking status: Not on file  . Smokeless tobacco: Not on file  . Alcohol Use: Not on file      Review of Systems  Unable to perform ROS: Intubated    Allergies  Review of patient's allergies indicates no known allergies.  Home Medications  No current outpatient prescriptions on file.  Wt 170 lb (77.111 kg)  Physical Exam  Nursing note and vitals reviewed. Constitutional: He appears well-developed and well-nourished. He appears toxic. He appears distressed. He is intubated.       King airway in place.  Breast delivered via bagging.  HENT:  Head:  Normocephalic and atraumatic.  Right Ear: External ear normal.  Left Ear: External ear normal.  Nose: Nose normal.  Mouth/Throat: Oropharynx is clear and moist. No oropharyngeal exudate.  Eyes: Conjunctivae normal are normal. Right eye exhibits no discharge. Left eye exhibits no discharge. No scleral icterus.       pupils unreactive.  right pupil dilated to 4 mm, left pupil 2mm  Neck: Normal range of motion. No JVD present. No tracheal deviation present. No thyromegaly present.  Cardiovascular:       Initially pt is pulseless.  Note good pulse with chest compressions via the automatic EMS device.  None noted when device is paused.  After return of pulses note irregular tachycardia with distant hear sounds.  Pulmonary/Chest: No accessory muscle usage or stridor. Apnea noted. Not tachypneic. He is intubated. No respiratory distress. He has decreased breath sounds in the right lower field and the left lower field. He has no wheezes. He has rhonchi. He has no rales. He exhibits no mass, no tenderness, no laceration, no crepitus, no edema, no deformity and no retraction.       No noted spontaneous respiratory effort initially.  After return of pulses, noted agonal respirations.  Abdominal: Soft. He exhibits distension (mild). He exhibits no pulsatile liver, no fluid wave, no abdominal bruit, no ascites, no pulsatile midline mass and no mass. Bowel sounds are decreased. There is no tenderness. There is no rigidity, no rebound, no guarding, no CVA tenderness,  no tenderness at McBurney's point and negative Murphy's sign. No hernia.  Musculoskeletal: Normal range of motion. He exhibits no edema and no tenderness.  Lymphadenopathy:    He has no cervical adenopathy.  Neurological: He is unresponsive.  Skin: Skin is warm and dry. No rash noted. No erythema. There is pallor.    ED Course  INTUBATION Date/Time: 06/19/2012 4:26 AM Performed by: Dana Allan T Authorized by: Dana Allan T Consent:  Verbal consent not obtained. The procedure was performed in an emergent situation. Indications: respiratory failure and airway protection Intubation method: direct Patient status: paralyzed (RSI) Preoxygenation: BVM Pretreatment medications: none Sedatives: etomidate Paralytic: succinylcholine Laryngoscope size: Mac 4 Tube size: 8.0 mm Tube type: cuffed Number of attempts: 1 Cricoid pressure: yes Cords visualized: yes Post-procedure assessment: chest rise and ETCO2 monitor Breath sounds: equal Cuff inflated: yes ETT to lip: 22 cm ETT to teeth: 21 cm Tube secured with: ETT holder Chest x-ray interpreted by me and radiologist. Chest x-ray findings: endotracheal tube in appropriate position Patient tolerance: Patient tolerated the procedure well with no immediate complications.  OG placement Date/Time: 06/19/2012 4:27 AM Performed by: Dana Allan T Authorized by: Dana Allan T Consent: The procedure was performed in an emergent situation. Required items: required blood products, implants, devices, and special equipment available Local anesthesia used: no Patient sedated: no Patient tolerance: Patient tolerated the procedure well with no immediate complications. Comments: Tube placed immediately after placement of the ETT.  Placement confirmed by return of gastric contents and by ascultation over the LUQ with delivered air.   (including critical care time)  Labs Reviewed  CBC WITH DIFFERENTIAL - Abnormal; Notable for the following:    WBC 24.0 (*)     All other components within normal limits  POCT I-STAT, CHEM 8 - Abnormal; Notable for the following:    Potassium 2.6 (*)     Glucose, Bld 204 (*)     All other components within normal limits  COMPREHENSIVE METABOLIC PANEL  TROPONIN I  CK TOTAL AND CKMB  PROTIME-INR  APTT  BLOOD GAS, ARTERIAL   No results found.   No diagnosis found.   Date: 06/19/2012 @0341   Rate: 140 bpm  Rhythm: atrial fibrillation  QRS  Axis: indeterminate  Intervals: QT prolonged  ST/T Wave abnormalities: nonspecific ST changes  Conduction Disutrbances:right bundle branch block  Narrative Interpretation:   Old EKG Reviewed: changes noted (p[rehospital EKG demonstrated a sinus rhythm with ST elevations in the anterior leads and inferior depressions.   Date: 06/19/2012 @03463   Rate: 137 bpm  Rhythm: atrial fibrillation  QRS Axis: indeterminate  Intervals:    ST/T Wave abnormalities: ST elevations anteriorly,  Inferior depression  Conduction Disutrbances:nonspecific intraventricular conduction delay  Narrative Interpretation:   Old EKG Reviewed: changes noted        MDM  Pt presents via EMS after a witnessed cardiac arrest.  CPR was continued on arrival and he was defibrillated once. The [facility] airway was removed and a ETT was placed.  After about 10 minutes of CPR and 2 doses of epi, note return of pulses.  He is noted to breath over the ventilator and grimace on placement of the OG tube.  He also grimaced during placement of the central line.  Both the prehospital and EKG obtained after return of pulses demonstrate changes consitent with a STEMI.   He has been intubated and saline, amiodarone, and heparin were administered.  Consult placed with the on-call interventional cardiologist and intensivist.  The intensivist  placed a left IJ TLC.  The cooling (hypothermia) protocol has been initiated.  I discussed his evaluation and condition at length with his family and the intensivist and cardiologist also performed introductions prior to him being taken emergently to the cath lab.   CRITICAL CARE Performed by: Dana Allan T   Total critical care time: 40  Critical care time was exclusive of separately billable procedures and treating other patients.  Critical care was necessary to treat or prevent imminent or life-threatening deterioration.  Critical care was time spent personally by me on the following activities:  development of treatment plan with patient and/or surrogate as well as nursing, discussions with consultants, evaluation of patient's response to treatment, examination of patient, obtaining history from patient or surrogate, ordering and performing treatments and interventions, ordering and review of laboratory studies, ordering and review of radiographic studies, pulse oximetry and re-evaluation of patient's condition.        Tobin Chad, MD 06/19/12 [phone]

## 2012-06-19 NOTE — Progress Notes (Signed)
Left groin with steady blood trickle and saturated dressing noted. Dressing changed. Blood tinged urine noted in foley tubing. Dr. Mayford Knife notified. Order obtained. Integralin off. Will monitor.

## 2012-06-19 NOTE — Procedures (Signed)
Central Venous Catheter Insertion Procedure Note [name] [name] [id number] 1955/08/30  Procedure: Insertion of Central Venous Catheter Indications: Assessment of intravascular volume and Drug and/or fluid administration  Procedure Details Consent: Unable to obtain consent because of emergent medical necessity. Time Out: Verified patient identification, verified procedure, site/side was marked, verified correct patient position, special equipment/implants available, medications/allergies/relevent history reviewed, required imaging and test results available.  Performed  Maximum sterile technique was used including antiseptics, cap, gloves, gown, hand hygiene, mask and sheet. Skin prep: Chlorhexidine; local anesthetic administered A antimicrobial bonded/coated triple lumen catheter was placed in the left internal jugular vein using the Seldinger technique. (Attemped L subclavian but could not cannulate vein.)  Ultrasound was used to verify the patency of the vein and for real time needle guidance.  Evaluation Blood flow good Complications: No apparent complications Patient did tolerate procedure well. Chest X-ray ordered to verify placement.  CXR: normal.  MCQUAID, DOUGLAS 06/19/2012, 4:31 AM

## 2012-06-19 NOTE — H&P (Addendum)
The patient is a 56 year old gentleman who began having chest pain early this morning. EMS was called and upon their arrival the patient had a witnessed ventricular fibrillation arrest. He has been previously healthy. He smokes cigarettes. There are no chronic medical illnesses. He underwent 30 minutes or greater CPR. The initial EKGs revealed hyperacute T wave changes in the precordial leads with reciprocal changes. I spoke to his family and we obtained emergency consent to proceed.  We just found that his troponins are elevated, his potassium is 2.1, hemoglobin is normal.    Drakkar Medeiros is a 56 y.o. male  Admit date: 06/19/2012 Referring Physician: [facility] ED Primary Cardiologist:: HWBSmith, III, MD Chief complaint / reason for admission:  Cardiac arrest in setting of anterior STEMI and post arrest Shock  HPI: The patient is 56 years of age and has had an upper respiratory illness. He developed chest pain at some point last evening or early this morning. According to his girlfriend he came into the bedroom complaining and then collapsed on the bed. She said that he was sweaty and appeared pale. They called 911 and upon arrival EKGs demonstrated an anterior MI. In attempting to transport the patient to the vehicle he suffered a ventricular fibrillation cardiac arrest and CPR was started. In the emergency room he had additional CPR with total CPR time approximately 30 minutes. After obtaining a pulse and stable rhythm the patient was "shocky" with systolic blood pressures less than 80 mmHg. He was intubated and we felt it appropriate to bring the patient to the catheterization laboratory for revascularization and place him on the hypothermia protocol.    PMH:    Past Medical History  Diagnosis Date  . Varicose veins     PSH:   No past surgical history on file.  ALLERGIES:   Review of patient's allergies indicates no known allergies.  Prior to Admit Meds:   No prescriptions prior to admission    Family HX:   No family history on file. Social HX:    History   Social History  . Marital Status: Single    Spouse Name: N/A    Number of Children: N/A  . Years of Education: N/A   Occupational History  . Not on file.   Social History Main Topics  . Smoking status: Current Every Day Smoker    Types: Cigarettes  . Smokeless tobacco: Not on file  . Alcohol Use: No  . Drug Use: No  . Sexually Active: Not on file   Other Topics Concern  . Not on file   Social History Narrative  . No narrative on file     ROS: Upper respiratory illness  Physical Exam: Blood pressure 88/63, pulse 104, temperature 97.7 F (36.5 C), resp. rate 21, weight 95 kg (209 lb 7 oz), SpO2 98.00%.    The patient is intubated. Skin color is relatively good under the circumstances. No obvious limb ischemia.  Chest exam reveals rhonchi and there is obvious pulmonary edema fluid from the endotracheal tube.  Cardiac exam reveals a gallop rhythm. No obvious murmur.  Abdomen is soft without obvious organomegaly.  Extremities reveal no edema and the femorals are 2+ and symmetric. Posterior tibial pulses are trace to 1+ bilaterally.  The neurological exam reveals a patient who is comatose following cardiac arrest and receiving sedation Labs:   Lab Results  Component Value Date   WBC 24.0* 06/19/2012   HGB 16.0 06/19/2012   HCT 47.0 06/19/2012   MCV 93.9  06/19/2012   PLT 231 06/19/2012    Lab 06/19/12 0345 06/19/12 0338  NA 143 --  K 2.6* --  CL 106 --  CO2 -- 18*  BUN 23 --  CREATININE 1.10 --  CALCIUM -- 9.6  PROT -- 6.8  BILITOT -- 0.3  ALKPHOS -- 82  ALT -- 81*  AST -- 94*  GLUCOSE 204* --   Lab Results  Component Value Date   CKTOTAL 416* 06/19/2012   CKMB 32.3* 06/19/2012   TROPONINI 3.15* 06/19/2012      Radiology: PORTABLE CHEST - 1 VIEW  Comparison: None.  Findings: Endotracheal tube tip projects 4.5 cm proximal to the  carina. Left IJ catheter tip projects over the  left  brachiocephalic vein in the SVC confluence. Heart size upper  normal to mildly enlarged. Interstitial prompts / peribronchial  thickening. NG tube descends into the abdomen, tip just below the  GE junction. Side port is just above the GE junction. No pleural  effusion or pneumothorax. No acute osseous finding. Nondisplaced  rib fractures related to resuscitation not excluded.  IMPRESSION:  Endotracheal tube tip 4.5 cm proximal to the carina.  NG tube side port is above the GE junction, therefore advancement  recommend.  Left IJ catheter tip projects over the left brachiocephalic vein  near the SVC confluence. No pneumothorax.  Interstitial prominence and peribronchial thickening may reflect  acute edema or bronchitis versus chronic bronchitic change.  Original Report Authenticated By: Jearld Lesch, M.D.    EKG:  Initial EKG revealed hyperacute T waves V2 through V6 with reciprocal change. Subsequent EKG post arrest demonstrated a right bundle branch block with sinus rhythm and PVCs.  ASSESSMENT:   1. Acute anterior myocardial infarction complicated by ventricular fibrillation cardiac arrest  2. Cardiogenic shock post cardiac arrest  3. Cardiac arrest do to ventricular fibrillation requiring CPR, total time approximately 30 minutes  Plan:  1. Emergency catheterization for the purpose of intra-aortic balloon, pulse sedation and recanalization of the infarct related vessel. Prognosis is poor but the patient is an and potentially salvageable. The high risk of the procedure and his poor prognosis was discussed with the patient's daughter and his fiance. Emergency consent was obtained. Lesleigh Noe 06/19/2012 6:10 AM

## 2012-06-19 NOTE — Progress Notes (Signed)
PULMONARY  / CRITICAL CARE MEDICINE  Name: [name] [name] MRN: [id number] DOB: 1956-06-22    LOS: 0  REFERRING MD :  [facility]  CHIEF COMPLAINT:  Cardiac arrest  BRIEF PATIENT DESCRIPTION: 56 y/o male with no known past medical history presented on 06/19/2012 to the [facility] ED in cardiac arrest in the setting of a STEMI.  Underwent 30 minutes of CPR and taken to the cath lab.  LINES / TUBES: 12/20 ETT >> 12/20 L IJ CVL >>  CULTURES: 12/20 rapid flu >>  ANTIBIOTICS:  SIGNIFICANT EVENTS:   LEVEL OF CARE:  ICU PRIMARY SERVICE:  Cardiology CONSULTANTS:  PCCM CODE STATUS DIET:  NPO DVT Px:  heparin GI Px:  pepcid  HISTORY OF PRESENT ILLNESS:  56 y/o male with no known past medical history presented on 06/19/2012 to the [facility] ED in cardiac arrest in the setting of a STEMI.  Underwent 30 minutes of CPR and taken to the cath lab.  His girlfriend says that he was in his usual state of health on 12/19 and came home from work around 0230 on 12/20 (normal time) but was short of breath and complaining of chest pain.  She called 911 not long after he came home.  He was still conscious when EMS arrived but lost consciousness on the way out of his house so CPR was initiated.  Apparently he stopped on the way home to by Nyquil.    VITAL SIGNS: Temp:  [95.9 F (35.5 C)-97.7 F (36.5 C)] 95.9 F (35.5 C) (12/20 0640) Pulse Rate:  [73-118] 73  (12/20 1000) Resp:  [0-22] 16  (12/20 1000) BP: (84-163)/(63-97) 131/95 mmHg (12/20 0900) SpO2:  [92 %-100 %] 97 % (12/20 1000) Arterial Line BP: (119-150)/(77-84) 119/78 mmHg (12/20 1000) FiO2 (%):  [40 %-100 %] 40 % (12/20 0640) Weight:  [77.111 kg (170 lb)-95 kg (209 lb 7 oz)] 89.3 kg (196 lb 13.9 oz) (12/20 0900) HEMODYNAMICS: CVP:  [8 mmHg] 8 mmHg VENTILATOR SETTINGS: Vent Mode:  [-] PRVC FiO2 (%):  [40 %-100 %] 40 % Set Rate:  [16 bmp-18 bmp] 16 bmp Vt Set:  [450 mL-500 mL] 450 mL PEEP:  [5 cmH20] 5 cmH20 Plateau Pressure:  [14 cmH20-15 cmH20] 15  cmH20 INTAKE / OUTPUT: Intake/Output      12/19 0701 - 12/20 0700 12/20 0701 - 12/21 0700   I.V. (mL/kg) 61.8 (0.7) 118.2 (1.3)   NG/GT 75    IV Piggyback 50    Total Intake(mL/kg) 186.8 (2) 118.2 (1.3)   Urine (mL/kg/hr) 1550 (0.7) 1700   Total Output 1550 1700   Net -1363.3 -1581.8         PHYSICAL EXAMINATION: Gen: cyanotic, on vent HEENT: NCAT, ETT in place PULM: Rhonchi bilaterally CV: Tachy, regular, no mgr AB: BS infrequent, soft, nontender Ext: cool, no edema Neuro: Vigorous cough, no purposeful movements, extensor response to pain; GCS 4  LABS: Cbc  Lab 06/19/12 0345 06/19/12 0338  WBC -- 24.0*  HGB 16.0 15.2  HCT 47.0 44.5  PLT -- 231   Chemistry  Lab 06/19/12 0841 06/19/12 0645 06/19/12 0345 06/19/12 0338  NA 142 141 143 --  K 3.7 3.3* 2.6* --  CL 106 104 106 --  CO2 22 22 -- 18*  BUN 16 18 23  --  CREATININE 0.85 1.01 1.10 --  CALCIUM 8.4 8.4 -- 9.6  MG -- -- -- --  PHOS -- -- -- --  GLUCOSE 169* 221* 204* --   Liver fxn  Lab 06/19/12 0338  AST 94*  ALT 81*  ALKPHOS 82  BILITOT 0.3  PROT 6.8  ALBUMIN 3.9   coags  Lab 06/19/12 0645 06/19/12 0338  APTT 106* 34  INR 1.79* 0.99   Sepsis markers No results found for this basename: LATICACIDVEN:3,PROCALCITON:3 in the last 168 hours Cardiac markers  Lab 06/19/12 0339  CKTOTAL 416*  CKMB 32.3*  TROPONINI 3.15*   BNP No results found for this basename: PROBNP:3 in the last 168 hours ABG  Lab 06/19/12 0345  PHART --  PCO2ART --  PO2ART --  HCO3 --  TCO2 21   CBG trend  Lab 06/19/12 0647  GLUCAP 204*   IMAGING: 12/20 ETT 4.5 cm above carina, CVL in place, possible bronchitis  ECG: Sinus tach, ST elevation in Lateral precordial leads  DIAGNOSES: Principal Problem:  *STEMI (ST elevation myocardial infarction) Active Problems:  Cardiac arrest  Acute respiratory failure  Ventricular fibrillation  Acute encephalopathy  Leukocytosis  Tobacco abuse  Cardiogenic  shock  ASSESSMENT / PLAN:  PULMONARY  ASSESSMENT: 1) Acute respiratory failure due to cardiac arrest 2) Likely bronchitis, secretions from tube, apparently purchased OTC cold drugs just prior to cardiac arrest;  PLAN:   -Full vent support. -ABG/CXR daily. -SBT/WUA after rewarming. -Check rapid flu given bronchitis, pending.  CARDIOVASCULAR  ASSESSMENT:  1) STEMI; major known risk factor for CAD is tobacco abuse 2) Cardiac arrest PLAN:  -Cath lab now. -Induced hypothermia. -If survives, counsel to quit tobacco.  RENAL  ASSESSMENT:   1) Hypokalemia PLAN:   -Replete IV and PO now. -Repeat BMET.  GASTROINTESTINAL  ASSESSMENT:   1) No acute issues PLAN:   -Stress ulcer prophylaxis. -OG tube. -Start TF once warmed.  HEMATOLOGIC  ASSESSMENT:   1) No acute issues PLAN:  -Monitor for bleeding.  INFECTIOUS  ASSESSMENT:   1) Acute bronchitis PLAN:   -Check rapid flu.  ENDOCRINE  ASSESSMENT:   1) no acute issues  PLAN:   -Monitor glucose  NEUROLOGIC  ASSESSMENT:   1) Acute encephalopathy/comatose from cardiac arrest, high risk for anoxic brain injury PLAN:   -Induced hypothermia protocol.  CLINICAL SUMMARY:   I have personally obtained a history, examined the patient, evaluated laboratory and imaging results, formulated the assessment and plan and placed orders.  CRITICAL CARE: The patient is critically ill with multiple organ systems failure and requires high complexity decision making for assessment and support, frequent evaluation and titration of therapies, application of advanced monitoring technologies and extensive interpretation of multiple databases. Critical Care Time devoted to patient care services described in this note is 45 minutes.   Koren Bound, MD Pulmonary and Critical Care Medicine [facility] Pager: [phone]  06/19/2012, 10:37 AM

## 2012-06-19 NOTE — Progress Notes (Signed)
  Echocardiogram 2D Echocardiogram has been performed.  [place]ian Co 06/19/2012, 3:28 PM

## 2012-06-19 NOTE — Care Management Note (Addendum)
    Page 1 of 2   06/26/2012     5:27:24 PM   CARE MANAGEMENT NOTE 06/26/2012  Patient:  [name],[name]   Account Number:  [id number]  Date Initiated:  06/19/2012  Documentation initiated by:  [name] [name]  Subjective/Objective Assessment:   adm w mi     Action/Plan:   lives alone   Anticipated DC Date:  07-22-2012   Anticipated DC Plan:  HOME/SELF CARE      DC Planning Services  CM consult      Choice offered to / List presented to:             Status of service:  In process, will continue to follow Medicare Important Message given?   (If response is "NO", the following Medicare IM given date fields will be blank) Date Medicare IM given:   Date Additional Medicare IM given:    Discharge Disposition:    Per UR Regulation:  Reviewed for med. necessity/level of care/duration of stay  If discussed at Long Length of Stay Meetings, dates discussed:    Comments:  06/26/12 [name] [name] [phone] PT S/P CARDIAC  ARREST WITH ANOXIC BRAIN INJURY; ADM ON 06/19/12.  PTA, PT INDEPENDENT, LIVES WITH GIRLFRIEND, [name] [name].  MET WITH [name], OT [name] [name], AND DR [name] [name] TO DISCUSS PT'S PROGRESS AND CONFIRM SUPPORT AT HOME.  GF IS DISABLED AND STATES CAN BE WITH PT 24/7 IF NEED BE.  SHE ALSO STATES PT'S SON, [name] [name] WILL BE MOVING IN TO ASSIST PT AT DISCHARGE.  RECOMMENDATION IS FOR OUTPT OT, PT, ST, AND NEUROPSYCH EVAL, AS PT STILL HAS EXTENSIVE COGNITIVE DEFICITS.  GF STATES SHE CAN TAKE PT TO APPTS. NEURO REHAB REFERRAL FORM SIGNED BY MD, COMPLETED AND FAXED TO [facility].  [name] SEEMS CAPABLE, YET SOMEWHAT OVERWHELMED REGARDING PT'S CURRENT STATE.  WILL CONT TO FOLLOW PROGRESS/ASSESS FOR NEEDS.  12/23 [name] dowell rn,bsn remains on vent  12/20 1023 [name] dowell rn,bsn [phone]

## 2012-06-19 NOTE — Code Documentation (Signed)
No pulse, chest compressions resumed.

## 2012-06-19 NOTE — Progress Notes (Signed)
The patient is critically ill post cardiac arrest, prolonged CPR, and s/p LAD BMS. He has IABP and is on Levo. His is post Bivalirudin and currently on IV heparin/plavix/IV Integrilin.  Plan: 1. IV integrilin until hypothermia D/C.           2. Wean and D/C IABP Sunday after hypothermia D/C.           3. D/C IV heparin 1-2 hours prior to balloon pull on Sunday           4. Continue NGT ASA and Plavix

## 2012-06-19 NOTE — ED Notes (Signed)
Cardiology, Garnette Scheuermann, at bedside

## 2012-06-19 NOTE — ED Notes (Signed)
Dr. Kendrick Fries at bedside, to insert central line

## 2012-06-19 NOTE — Consult Note (Signed)
PULMONARY  / CRITICAL CARE MEDICINE  Name: [name] [name] MRN: [id number] DOB: 02/14/1956    LOS: 0  REFERRING MD :  [facility]  CHIEF COMPLAINT:  Cardiac arrest  BRIEF PATIENT DESCRIPTION: 56 y/o male with no known past medical history presented on 06/19/2012 to the [facility] ED in cardiac arrest in the setting of a STEMI.  Underwent 30 minutes of CPR and taken to the cath lab.  LINES / TUBES: 12/20 ETT >> 12/20 L IJ CVL >>  CULTURES: 12/20 rapid flu >>  ANTIBIOTICS:   SIGNIFICANT EVENTS:    LEVEL OF CARE:  ICU PRIMARY SERVICE:  Cardiology CONSULTANTS:  PCCM CODE STATUS DIET:  NPO DVT Px:  heparin GI Px:  pepcid  HISTORY OF PRESENT ILLNESS:  56 y/o male with no known past medical history presented on 06/19/2012 to the [facility] ED in cardiac arrest in the setting of a STEMI.  Underwent 30 minutes of CPR and taken to the cath lab.  His girlfriend says that he was in his usual state of health on 12/19 and came home from work around 0230 on 12/20 (normal time) but was short of breath and complaining of chest pain.  She called 911 not long after he came home.  He was still conscious when EMS arrived but lost consciousness on the way out of his house so CPR was initiated.  Apparently he stopped on the way home to by Nyquil.    PAST MEDICAL HISTORY :  Past Medical History  Diagnosis Date  . Varicose veins    No past surgical history on file. Prior to Admission medications   Not on File   No Known Allergies  FAMILY HISTORY:  No family history on file. SOCIAL HISTORY:  reports that he has been smoking Cigarettes.  He does not have any smokeless tobacco history on file. He reports that he does not drink alcohol or use illicit drugs.  REVIEW OF SYSTEMS:  Cannot obtain due to intubation   INTERVAL HISTORY:   VITAL SIGNS: Temp:  [97.7 F (36.5 C)] 97.7 F (36.5 C) (12/20 0415) Pulse Rate:  [100-118] 104  (12/20 0415) Resp:  [18-22] 21  (12/20 0415) BP: (84-145)/(63-97) 88/63 mmHg  (12/20 0415) SpO2:  [95 %-100 %] 98 % (12/20 0415) FiO2 (%):  [100 %] 100 % (12/20 0400) Weight:  [77.111 kg (170 lb)] 77.111 kg (170 lb) (12/20 0300) HEMODYNAMICS:   VENTILATOR SETTINGS: Vent Mode:  [-] PRVC FiO2 (%):  [100 %] 100 % Set Rate:  [18 bmp] 18 bmp Vt Set:  [500 mL] 500 mL PEEP:  [5 cmH20] 5 cmH20 Plateau Pressure:  [14 cmH20] 14 cmH20 INTAKE / OUTPUT: Intake/Output    None     PHYSICAL EXAMINATION: Gen: cyanotic, on vent HEENT: NCAT, ETT in place PULM: Rhonchi bilaterally CV: Tachy, regular, no mgr AB: BS infrequent, soft, nontender Ext: cool, no edema Neuro: Vigorous cough, no purposeful movements, extensor response to pain; GCS 4   LABS: Cbc  Lab 06/19/12 0345 06/19/12 0338  WBC -- 24.0*  HGB 16.0 15.2  HCT 47.0 44.5  PLT -- 231    Chemistry   Lab 06/19/12 0345 06/19/12 0338  NA 143 141  K 2.6* 2.1*  CL 106 99  CO2 -- 18*  BUN 23 18  CREATININE 1.10 1.09  CALCIUM -- 9.6  MG -- --  PHOS -- --  GLUCOSE 204* 225*    Liver fxn  Lab 06/19/12 0338  AST 94*  ALT 81*  ALKPHOS 82  BILITOT 0.3  PROT 6.8  ALBUMIN 3.9   coags  Lab 06/19/12 0338  APTT 34  INR 0.99   Sepsis markers No results found for this basename: LATICACIDVEN:3,PROCALCITON:3 in the last 168 hours Cardiac markers  Lab 06/19/12 0339  CKTOTAL 416*  CKMB 32.3*  TROPONINI 3.15*   BNP No results found for this basename: PROBNP:3 in the last 168 hours ABG  Lab 06/19/12 0345  PHART --  PCO2ART --  PO2ART --  HCO3 --  TCO2 21    CBG trend No results found for this basename: GLUCAP:5 in the last 168 hours  IMAGING: 12/20 ETT 4.5 cm above carina, CVL in place, possible bronchitis  ECG: Sinus tach, ST elevation in Lateral precordial leads  DIAGNOSES: Principal Problem:  *Cardiac arrest Active Problems:  Acute respiratory failure  STEMI (ST elevation myocardial infarction)  Ventricular fibrillation  Acute encephalopathy  Leukocytosis   ASSESSMENT /  PLAN:  PULMONARY  ASSESSMENT: 1) Acute respiratory failure due to cardiac arrest 2) Likely bronchitis, secretions from tube, apparently purchased OTC cold drugs just prior to cardiac arrest;  PLAN:   -Full vent support -abg/cxr daily -SBT/WUA after rewarming -check rapid flu given bronchitis  CARDIOVASCULAR  ASSESSMENT:  1) STEMI; major known risk factor for CAD is tobacco abuse 2) Cardiac arrest PLAN:  -Cath lab now -induced hypothermia -if survives, counsel to quit tobacco  RENAL  ASSESSMENT:   1) Hypokalemia PLAN:   -replete IV and PO now -repeat BMET  GASTROINTESTINAL  ASSESSMENT:   1) No acute issues PLAN:   -stress ulcer prophylaxis -OG tube  HEMATOLOGIC  ASSESSMENT:   1) No acute issues PLAN:  -monitor for bleeding  INFECTIOUS  ASSESSMENT:   1) Acute bronchitis PLAN:   -check rapid flu  ENDOCRINE  ASSESSMENT:   1) no acute issues  PLAN:   -monitor glucose  NEUROLOGIC  ASSESSMENT:   1) Acute encephalopathy/comatose from cardiac arrest, high risk for anoxic brain injury PLAN:   -induced hypothermia protocol  CLINICAL SUMMARY:   I have personally obtained a history, examined the patient, evaluated laboratory and imaging results, formulated the assessment and plan and placed orders. CRITICAL CARE: The patient is critically ill with multiple organ systems failure and requires high complexity decision making for assessment and support, frequent evaluation and titration of therapies, application of advanced monitoring technologies and extensive interpretation of multiple databases. Critical Care Time devoted to patient care services described in this note is 45 minutes.   Max Fickle, MD Pulmonary and Critical Care Medicine [facility] Pager: [phone]  06/19/2012, 4:32 AM

## 2012-06-19 NOTE — Code Documentation (Signed)
Pt has a pulse, sinus tachycardia on the monitor.

## 2012-06-19 NOTE — ED Notes (Signed)
Pt arrived to cath lab, report given to cath lab staff.  [employer] transported with pt, pads were not opened or placed on patient.

## 2012-06-19 NOTE — Procedures (Signed)
Arterial Catheter Insertion Procedure Note [name] [name] [id number] 12/18/1955  Procedure: Insertion of Arterial Catheter  Indications: Blood pressure monitoring  Procedure Details Consent: Unable to obtain consent because of emergent medical necessity. Time Out: Verified patient identification, verified procedure, site/side was marked, verified correct patient position, special equipment/implants available, medications/allergies/relevent history reviewed, required imaging and test results available.  Performed  Maximum sterile technique was used including antiseptics, cap, gloves, gown, hand hygiene, mask and sheet. Skin prep: Chlorhexidine; local anesthetic administered 20 gauge catheter was inserted into right radial artery using the Seldinger technique.  Evaluation Blood flow good; BP tracing good. Complications: No apparent complications.   Joylene John 06/19/2012

## 2012-06-19 NOTE — Progress Notes (Addendum)
INITIAL NUTRITION ASSESSMENT  DOCUMENTATION CODES Per approved criteria  -Not Applicable   INTERVENTION: 1. Once rewarmed, recommend initiation of enteral nutrition if pt to remain intubated and within goals of care. If enteral nutrition desired, recommend initiation of Osmolite 1.2 at 25 ml/hr, advance by 10 ml/hr every 4 hours or as tolerated, to goal of 55 ml/hr. 30 ml Prostat via tube TID. Goal rate will provide: 1884 kcal, 119 grams protein, 1082 ml free water.  2. RD to continue to follow nutrition care plan  NUTRITION DIAGNOSIS: Inadequate oral intake related to inability to eat as evidenced by NPO status.   Goal: Initiate nutrition support if pt to remain intubated >24-48 hours and s/p rewarmed. Intake to meet at least 90% of estimated nutrition needs.  Monitor:  weight trends, lab trends, I/O's, extubation, vent settings and temperature, initiation of nutrition support  Reason for Assessment: VDRF  56 y.o. male  Admitting Dx: STEMI (ST elevation myocardial infarction)  ASSESSMENT: Per chart, pt with chest pain earlier this morning. No past medical hx, does smoke cigarettes. S/p cardiac arrest. Underwent emergency catheterization and placed on hypothermia protocol. Noted pt with poor prognosis per cardiology. Plan to start TF once re-warmed per CCM.  Patient is currently intubated on ventilator support.  MV: 7.2 Temp:Temp (24hrs), Avg:94.5 F (34.7 C), Min:90.7 F (32.6 C), Max:99.3 F (37.4 C)  Height: Ht Readings from Last 1 Encounters:  06/19/12 5\' 9"  (1.753 m)   Weight: Wt Readings from Last 1 Encounters:  06/19/12 196 lb 13.9 oz (89.3 kg)   Ideal Body Weight: 160 lb/72.7 kg  % Ideal Body Weight: 123%  Wt Readings from Last 10 Encounters:  06/19/12 196 lb 13.9 oz (89.3 kg)  06/19/12 196 lb 13.9 oz (89.3 kg)   Usual Body Weight: unable to obtain  % Usual Body Weight: n/a  BMI:  Body mass index is 29.07 kg/(m^2). Pt is overweight.  Estimated  Nutritional Needs: Kcal: 1850 kcal Protein: 110 - 140 grams Fluid: 1.9 - 2.2 liters daily  Skin: intact  Diet Order:    EDUCATION NEEDS: -No education needs identified at this time   Intake/Output Summary (Last 24 hours) at 06/19/12 1323 Last data filed at 06/19/12 1300  Gross per 24 hour  Intake 633.15 ml  Output   3326 ml  Net -2692.85 ml    Last BM: PTA  Labs:   Lab 06/19/12 1200 06/19/12 1019 06/19/12 0841  NA 139 141 142  K 5.1 4.1 3.7  CL 106 107 106  CO2 19 22 22   BUN 17 16 16   CREATININE 0.87 0.81 0.85  CALCIUM 8.4 8.5 8.4  MG -- -- --  PHOS -- -- --  GLUCOSE 163* 165* 169*    CBG (last 3)   Basename 06/19/12 1108 06/19/12 1012 06/19/12 0905  GLUCAP 157* 158* 165*    Scheduled Meds:    . sodium chloride  2,000 mL Intravenous Once  . albuterol-ipratropium  6 puff Inhalation Q4H  . antiseptic oral rinse  15 mL Mouth Rinse QID  . artificial tears   Both Eyes Q8H  . aspirin  300 mg Rectal NOW  . chlorhexidine  15 mL Mouth/Throat BID  . cisatracurium  0.1 mg/kg Intravenous Once  . famotidine (PEPCID) IV  20 mg Intravenous Q12H  . fentaNYL  100 mcg Intravenous Once  . insulin aspart  2-6 Units Subcutaneous Q4H  . lidocaine (cardiac) 100 mg/44ml      . metoprolol tartrate  12.5 mg Per  NG tube BID  . midazolam      . midazolam  2 mg Intravenous Once  . rocuronium      . succinylcholine        Continuous Infusions:    . cisatracurium (NIMBEX) infusion 1 mcg/kg/min (06/19/12 0700)  . dextrose    . DOPamine 4 mcg/kg/min (06/19/12 1313)  . fentaNYL infusion INTRAVENOUS 200 mcg/hr (06/19/12 0734)  . heparin 800 Units/hr (06/19/12 0845)  . insulin (NOVOLIN-R) infusion    . midazolam (VERSED) infusion 6 mg/hr (06/19/12 1315)  . norepinephrine (LEVOPHED) Adult infusion 30 mcg/min (06/19/12 1222)    Past Medical History  Diagnosis Date  . Varicose veins     No past surgical history on file.  Jarold Motto MS, RD, LDN Pager:  [phone] After-hours pager: [phone]

## 2012-06-19 NOTE — ED Notes (Signed)
Per EMS: were called out for CP, diaphoresis, left shoulder pain, and nausea.  Pt does not have any medical history, does not take any medications.  Pt took ASA PTA, STEMI seen before arrest.  As they were taking the pt down the stairs, pt had witnessed arrest.  Slumped over and stopped talking.  V-fib on the monitor.  Began chest compressions and inserted King airway.  Delivered 4 shocks PTA.  Pt was flipping between v-fib and sinus arrythmia for EMS.   Upon arrival to ED: pt was receiving chest compressions, compressions were held and pt was found to have a pulse.

## 2012-06-19 NOTE — CV Procedure (Signed)
     Diagnostic Cardiac Catheterization Report  [name] [name]  56 y.o.  male Jan 19, 1956  Procedure Date:06/19/2012 Referring Physician: Tressie Ellis ED Primary Cardiologist: HWBSmith, III   PROCEDURE:  Left heart catheterization with selective coronary angiography, left ventriculogram.  INDICATIONS:  Cardiogenic shock following prolonged CPR in the setting of anterior ST elevation myocardial infarction.  The risks, benefits, and details of the procedure were explained to the patients family.  Marland Kitchen emergency consent was obtained.  PROCEDURE TECHNIQUE:  After Xylocaine anesthesia a 7.5 French sheath was placed in the  left femoral artery and a 6 French sheath was placed in the right femoral both with a single anterior needle wall sticks.  An IV bolus followed by an infusion of bivalirudin was started. We then advanced an intra-aortic balloon pump into the descending aorta. We positioned the pump fluoroscopically. One-to-one pumping was begun. Coronary angiography was done using a 6 [place] A2 multipurpose catheter.  Left ventriculography was not performed.   An XB LAD 6 [place] guide catheter was then positioned in the left main and a BMW wire was advanced across the total occlusion in the proximal LAD. We use an expressway thrombectomy catheter which reestablished flow in the LAD. The patient had a segmental 95% stenosis after thrombus extraction. A 20 mm long by 3.5 mm diameter Veriflex bare-metal stent was positioned and deployed to 12 atmospheres. Post dilatation with a 15 mm long by 3.75 [place] balloon was performed to 14 atmospheres.  600 mg of Plavix was crushed and administered per NG tube. 324 mgs of aspirin was crushed and administered per NG tube.  Angio-Seal was performed with good hemostasis in the right femoral cath site.   CONTRAST:  Total of 200 cc.  COMPLICATIONS:  None.    HEMODYNAMICS:  Aortic pressure was 75/60 mmHg with an augmented pressure of 90 mm mercury; LV pressure was not  recorded.  ANGIOGRAPHIC DATA:   The left main coronary artery is widely patent..  The left anterior descending artery is totally occluded after the origin of the first diagonal in the proximal segment..  The left circumflex artery is dominant giving origin to 3 obtuse marginal branches and the PDA. Luminal irregularities are noted but no high-grade obstruction is seen..  The right coronary artery is nondominant.  LEFT VENTRICULOGRAM:   not performed  PCI RESULTS:   As noted above the LAD was totally occluded proximally. After bare-metal stenting 0% stenosis was noted in the treatment area post dilated to 3.75 mm in diameter. The segment distal to the stent contains eccentric 40-50% narrowing that I did not believe was flow limiting.  IMPRESSIONS:  1. Acute anterior wall STEMI resulting in ventricular fibrillation cardiac arrest and prolonged CPR.  2. Successful recanalization of the totally occluded proximal LAD 0% stenosis with TIMI grade 3 flow using a bare-metal stent.  3. Widely patent dominant circumflex and nondominant right coronary.  4. Left ventriculography was not performed.  5. Intra-aortic balloon counterpulsation   RECOMMENDATION:  1. Decreased bivalirudin to .25 mg per kilogram per minute.  2. Daily aspirin and Plavix per NG tube  3. Check P2 Y12 level  4. Continue one-to-one intra-aortic balloon pumping for 24 hours.  5. 2-D echocardiogram to assess LV function  6. Hypothermia protocol

## 2012-06-19 NOTE — Progress Notes (Signed)
Chaplain Note:  Chaplain responded immediately to Code STEMI page received at 03:44.  When chaplain arrived in ED, pt was being treated in trauma bay.  Chaplain met with pt's family in family conference room, providing spiritual comfort, support, and prayer for pt and family while pt was in ED and during the cath procedure.  Chaplain supported Cath Lab staff by acting as liaison between pt's family and lab.  After the pt's cath procedure, chaplain escorted family to CICU waiting area and informed CICU staff of family's whereabouts and cell phone numbers.  Pt's family expressed appreciation for chaplain support.  Chaplain will refer this case to chaplain normally assigned to CICU so that spiritual support may be continued.  06/19/12 0600  Clinical Encounter Type  Visited With Patient;Family  Visit Type Spiritual support  Referral From Other (Comment) (Code STEMI page)  Consult/Referral To Chaplain  Recommendations Continue spiritual support  Spiritual Encounters  Spiritual Needs Emotional;Prayer  Stress Factors  Patient Stress Factors Major life changes;Loss of control;Health changes  Family Stress Factors Major life changes;Lack of knowledge   Verdie Shire, Chaplain [phone]

## 2012-06-20 ENCOUNTER — Inpatient Hospital Stay (HOSPITAL_COMMUNITY): Payer: BC Managed Care – PPO

## 2012-06-20 LAB — POCT I-STAT, CHEM 8
BUN: 32 mg/dL — ABNORMAL HIGH (ref 6–23)
BUN: 20 mg/dL (ref 6–23)
Calcium, Ion: 1.2 mmol/L (ref 1.12–1.23)
Calcium, Ion: 1.21 mmol/L (ref 1.12–1.23)
Chloride: 108 mEq/L (ref 96–112)
Chloride: 110 mEq/L (ref 96–112)
Creatinine, Ser: 1 mg/dL (ref 0.50–1.35)
Creatinine, Ser: 0.9 mg/dL (ref 0.50–1.35)
Glucose, Bld: 134 mg/dL — ABNORMAL HIGH (ref 70–99)
Glucose, Bld: 120 mg/dL — ABNORMAL HIGH (ref 70–99)
HCT: 48 % (ref 39.0–52.0)
HCT: 46 % (ref 39.0–52.0)
Hemoglobin: 16.3 g/dL (ref 13.0–17.0)
Hemoglobin: 15.6 g/dL (ref 13.0–17.0)
Potassium: 5.7 mEq/L — ABNORMAL HIGH (ref 3.5–5.1)
Potassium: 4.2 mEq/L (ref 3.5–5.1)
Sodium: 143 mEq/L (ref 135–145)
Sodium: 143 mEq/L (ref 135–145)
TCO2: 24 mmol/L (ref 0–100)
TCO2: 22 mmol/L (ref 0–100)

## 2012-06-20 LAB — HEPARIN LEVEL (UNFRACTIONATED)
Heparin Unfractionated: 0.22 IU/mL — ABNORMAL LOW (ref 0.30–0.70)
Heparin Unfractionated: 0.25 [IU]/mL — ABNORMAL LOW (ref 0.30–0.70)
Heparin Unfractionated: 0.15 IU/mL — ABNORMAL LOW (ref 0.30–0.70)
Heparin Unfractionated: 0.14 IU/mL — ABNORMAL LOW (ref 0.30–0.70)

## 2012-06-20 LAB — CBC
HCT: 46.4 % (ref 39.0–52.0)
Hemoglobin: 15.9 g/dL (ref 13.0–17.0)
MCH: 32.4 pg (ref 26.0–34.0)
MCHC: 34.3 g/dL (ref 30.0–36.0)
MCV: 94.7 fL (ref 78.0–100.0)
Platelets: 222 10*3/uL (ref 150–400)
RBC: 4.9 MIL/uL (ref 4.22–5.81)
RDW: 13.9 % (ref 11.5–15.5)
WBC: 24.5 10*3/uL — ABNORMAL HIGH (ref 4.0–10.5)

## 2012-06-20 LAB — BASIC METABOLIC PANEL
BUN: 20 mg/dL (ref 6–23)
BUN: 20 mg/dL (ref 6–23)
BUN: 20 mg/dL (ref 6–23)
BUN: 18 mg/dL (ref 6–23)
CO2: 22 mEq/L (ref 19–32)
CO2: 21 mEq/L (ref 19–32)
CO2: 21 mEq/L (ref 19–32)
CO2: 20 mEq/L (ref 19–32)
Calcium: 8.4 mg/dL (ref 8.4–10.5)
Calcium: 8.4 mg/dL (ref 8.4–10.5)
Calcium: 8 mg/dL — ABNORMAL LOW (ref 8.4–10.5)
Calcium: 8.4 mg/dL (ref 8.4–10.5)
Chloride: 107 mEq/L (ref 96–112)
Chloride: 106 mEq/L (ref 96–112)
Chloride: 106 mEq/L (ref 96–112)
Chloride: 108 mEq/L (ref 96–112)
Creatinine, Ser: 0.97 mg/dL (ref 0.50–1.35)
Creatinine, Ser: 0.97 mg/dL (ref 0.50–1.35)
Creatinine, Ser: 0.87 mg/dL (ref 0.50–1.35)
Creatinine, Ser: 0.83 mg/dL (ref 0.50–1.35)
GFR calc Af Amer: >90 mL/min (ref >90)
GFR calc Af Amer: >90 mL/min (ref >90)
GFR calc Af Amer: >90 mL/min (ref >90)
GFR calc Af Amer: >90 mL/min (ref >90)
GFR calc non Af Amer: >90 mL/min (ref >90)
GFR calc non Af Amer: >90 mL/min (ref >90)
GFR calc non Af Amer: >90 mL/min (ref >90)
GFR calc non Af Amer: >90 mL/min (ref >90)
Glucose, Bld: 150 mg/dL — ABNORMAL HIGH (ref 70–99)
Glucose, Bld: 138 mg/dL — ABNORMAL HIGH (ref 70–99)
Glucose, Bld: 134 mg/dL — ABNORMAL HIGH (ref 70–99)
Glucose, Bld: 121 mg/dL — ABNORMAL HIGH (ref 70–99)
Potassium: 4.3 mEq/L (ref 3.5–5.1)
Potassium: 4.5 mEq/L (ref 3.5–5.1)
Potassium: >7.5 mEq/L (ref 3.5–5.1)
Potassium: 4 mEq/L (ref 3.5–5.1)
Sodium: 141 mEq/L (ref 135–145)
Sodium: 138 mEq/L (ref 135–145)
Sodium: 136 mEq/L (ref 135–145)
Sodium: 140 mEq/L (ref 135–145)

## 2012-06-20 LAB — POCT I-STAT 3, ART BLOOD GAS (G3+)
Acid-base deficit: 6 mmol/L — ABNORMAL HIGH (ref 0.0–2.0)
Acid-base deficit: 6 mmol/L — ABNORMAL HIGH (ref 0.0–2.0)
Acid-base deficit: 6 mmol/L — ABNORMAL HIGH (ref 0.0–2.0)
Bicarbonate: 23.3 mEq/L (ref 20.0–24.0)
Bicarbonate: 23.1 mEq/L (ref 20.0–24.0)
Bicarbonate: 21.5 mEq/L (ref 20.0–24.0)
O2 Saturation: 94 %
O2 Saturation: 92 %
O2 Saturation: 87 %
Patient temperature: 91.8
Patient temperature: 34
Patient temperature: 34.6
TCO2: 25 mmol/L (ref 0–100)
TCO2: 25 mmol/L (ref 0–100)
TCO2: 23 mmol/L (ref 0–100)
pCO2 arterial: 51.7 mmHg — ABNORMAL HIGH (ref 35.0–45.0)
pCO2 arterial: 51.1 mmHg — ABNORMAL HIGH (ref 35.0–45.0)
pCO2 arterial: 43.9 mmHg (ref 35.0–45.0)
pH, Arterial: 7.24 — ABNORMAL LOW (ref 7.350–7.450)
pH, Arterial: 7.246 — ABNORMAL LOW (ref 7.350–7.450)
pH, Arterial: 7.286 — ABNORMAL LOW (ref 7.350–7.450)
pO2, Arterial: 70 mmHg — ABNORMAL LOW (ref 80.0–100.0)
pO2, Arterial: 65 mmHg — ABNORMAL LOW (ref 80.0–100.0)
pO2, Arterial: 52 mmHg — ABNORMAL LOW (ref 80.0–100.0)

## 2012-06-20 LAB — BASIC METABOLIC PANEL WITH GFR
BUN: 20 mg/dL (ref 6–23)
CO2: 21 meq/L (ref 19–32)
Calcium: 8.2 mg/dL — ABNORMAL LOW (ref 8.4–10.5)
Chloride: 108 meq/L (ref 96–112)
Creatinine, Ser: 0.97 mg/dL (ref 0.50–1.35)
GFR calc Af Amer: >90 mL/min
GFR calc non Af Amer: >90 mL/min
Glucose, Bld: 141 mg/dL — ABNORMAL HIGH (ref 70–99)
Potassium: 4.6 meq/L (ref 3.5–5.1)
Sodium: 140 meq/L (ref 135–145)

## 2012-06-20 LAB — GLUCOSE, CAPILLARY
Glucose-Capillary: 106 mg/dL — ABNORMAL HIGH (ref 70–99)
Glucose-Capillary: 117 mg/dL — ABNORMAL HIGH (ref 70–99)
Glucose-Capillary: 118 mg/dL — ABNORMAL HIGH (ref 70–99)
Glucose-Capillary: 130 mg/dL — ABNORMAL HIGH (ref 70–99)
Glucose-Capillary: 138 mg/dL — ABNORMAL HIGH (ref 70–99)
Glucose-Capillary: 143 mg/dL — ABNORMAL HIGH (ref 70–99)
Glucose-Capillary: 94 mg/dL (ref 70–99)

## 2012-06-20 MED ORDER — HEPARIN (PORCINE) IN NACL 100-0.45 UNIT/ML-% IJ SOLN
1300.0000 [IU]/h | INTRAMUSCULAR | Status: DC
  Filled 2012-06-20: qty 250

## 2012-06-20 MED ORDER — HEPARIN (PORCINE) IN NACL 100-0.45 UNIT/ML-% IJ SOLN
1800.0000 [IU]/h | INTRAMUSCULAR | Status: DC
  Administered 2012-06-21: 1800 [IU]/h via INTRAVENOUS
  Filled 2012-06-20 (×3): qty 250

## 2012-06-20 MED ORDER — SODIUM CHLORIDE 0.9 % IV SOLN: INTRAVENOUS | Status: DC

## 2012-06-20 MED ORDER — SODIUM CHLORIDE 0.9 % IV SOLN
1.0000 ug/kg/min | INTRAVENOUS | Status: DC
  Filled 2012-06-20: qty 20

## 2012-06-20 MED ORDER — SODIUM CHLORIDE 0.9 % IV SOLN
3.0000 g | Freq: Four times a day (QID) | INTRAVENOUS | Status: DC
  Administered 2012-06-20 – 2012-06-21 (×4): 3 g via INTRAVENOUS
  Filled 2012-06-20 (×6): qty 3

## 2012-06-20 NOTE — Progress Notes (Signed)
COAGULATION - Follow Up CONSULT NOTE  Pharmacy Consult for Heparin Indication:  Anticoagulation with IABP in place  No Known Allergies  Patient Measurements: Height: 5\' 9"  (175.3 cm) Weight: 196 lb 13.9 oz (89.3 kg) IBW/kg (Calculated) : 70.7  Heparin Dosing Weight: 88.6 kg  Vital Signs: Temp: 96.3 F (35.7 C) (12/21 2100) Temp src: Temporal (12/21 2100) BP: 109/71 mmHg (12/21 1720) Pulse Rate: 95  (12/21 2100)  Labs:  Basename 06/20/12 2100 06/20/12 1815 06/20/12 1500 06/20/12 1240 06/20/12 1215 06/20/12 1000 06/20/12 0506 06/19/12 2159 06/19/12 1200 06/19/12 0645 06/19/12 0339 06/19/12 0338  HGB -- 15.6 -- -- 16.3 -- -- -- -- -- -- --  HCT -- 46.0 -- -- 48.0 -- 46.4 -- -- -- -- --  PLT -- -- -- -- -- -- 222 225 -- -- -- 231  APTT -- -- -- -- -- -- -- -- 58* 106* -- 34  LABPROT -- -- -- -- -- -- -- -- 13.7 20.2* -- 13.0  INR -- -- -- -- -- -- -- -- 1.06 1.79* -- 0.99  HEPARINUNFRC 0.14* -- 0.15* -- -- 0.25* -- -- -- -- -- --  CREATININE -- 0.90 -- 0.97 1.00 -- -- -- -- -- -- --  CKTOTAL -- -- -- -- -- -- -- -- -- -- 416* --  CKMB -- -- -- -- -- -- -- -- -- -- 32.3* --  TROPONINI -- -- -- -- -- -- -- -- -- -- 3.15* --    Estimated Creatinine Clearance: 102.4 ml/min (by C-G formula based on Cr of 0.9).   Medications:  Infusions:     . [EXPIRED] sodium chloride Stopped (06/20/12 0643)  . sodium chloride 20 mL/hr at 06/20/12 2000  . cisatracurium (NIMBEX) infusion 1.2 mcg/kg/min (06/20/12 2100)  . DOPamine 5 mcg/kg/min (06/20/12 2032)  . fentaNYL infusion INTRAVENOUS 200 mcg/hr (06/20/12 1549)  . heparin 1,300 Units/hr (06/20/12 1650)  . midazolam (VERSED) infusion 6 mg/hr (06/19/12 2143)  . norepinephrine (LEVOPHED) Adult infusion 30 mcg/min (06/20/12 1213)  . [DISCONTINUED] cisatracurium (NIMBEX) infusion 1.383 mcg/kg/min (06/20/12 2004)  . [DISCONTINUED] heparin 1,100 Units/hr (06/20/12 1059)    Assessment: 55 y.o. M who presented to the [facility] on 12/20, had  witness VFib arrest, STEMI, emergent cath with BMS placed in the LAD along with IABP and stabilized on the hypothermia protocol.  The patient also continues on heparin for anticoagulation with the IABP still in place. Heparin level is below goal after an increase to 1300 units/hr during rewarming phase (HL 0.14, goal of 0.2-0.5).   Goal of Therapy:  Heparin Level 0.2-0.5 units/ml (with IABP in place) Monitor platelets by anticoagulation protocol: Yes   Plan: HL=0.14 -Increase heparin to 1500 units/hr -Heparin level in 4 hours (with next BMET)  Harland German, Pharm D 06/20/2012 9:43 PM

## 2012-06-20 NOTE — Progress Notes (Addendum)
PULMONARY  / CRITICAL CARE MEDICINE  Name: [name] Walker MRN: [id number] DOB: 10/12/1955    LOS: 1  REFERRING MD :  [facility] ED[facility]  CHIEF COMPLAINT:  Cardiac arrest  BRIEF PATIENT DESCRIPTION: 56 y/o male smoker with no known past medical history presented on 06/19/2012 to the [facility] ED in cardiac arrest in the setting of AW- STEMI.  Underwent 30 minutes of CPR and s/p  successful PCI of proximal occluded LAD.  Cardiogenic shock requiring pressors and IABP    LINES / TUBES: 12/20 ETT >> 12/20 L IJ CVL >>  CULTURES: 12/20 rapid flu >>neg  ANTIBIOTICS:  SIGNIFICANT EVENTS:   LEVEL OF CARE:  ICU PRIMARY SERVICE:  Cardiology CONSULTANTS:  PCCM CODE STATUS DIET:  NPO DVT Px:  heparin GI Px:  pepcid   VITAL SIGNS: Temp:  [90.5 F (32.5 C)-93 F (33.9 C)] 91.8 F (33.2 C) (12/21 0900) Pulse Rate:  [53-85] 70  (12/21 0903) Resp:  [0-16] 16  (12/21 0903) BP: (78-138)/(50-101) 138/83 mmHg (12/21 0700) SpO2:  [94 %-100 %] 100 % (12/21 0903) Arterial Line BP: (84-138)/(54-92) 109/67 mmHg (12/21 0900) FiO2 (%):  [40 %] 40 % (12/21 0903) Weight:  [89.3 kg (196 lb 13.9 oz)] 89.3 kg (196 lb 13.9 oz) (12/20 2000) HEMODYNAMICS: CVP:  [10 mmHg-15 mmHg] 14 mmHg VENTILATOR SETTINGS: Vent Mode:  [-] PRVC FiO2 (%):  [40 %] 40 % Set Rate:  [16 bmp] 16 bmp Vt Set:  [450 mL] 450 mL PEEP:  [5 cmH20] 5 cmH20 Plateau Pressure:  [15 cmH20-16 cmH20] 16 cmH20 INTAKE / OUTPUT: Intake/Output      12/20 0701 - 12/21 0700 12/21 0701 - 12/22 0700   I.V. (mL/kg) 2844.3 (31.9) 178.9 (2)   NG/GT  15   IV Piggyback 250    Total Intake(mL/kg) 3094.3 (34.7) 193.9 (2.2)   Urine (mL/kg/hr) 2269 (1.1) 39   Emesis/NG output 250 0   Total Output 2519 39   Net +575.3 +154.9         PHYSICAL EXAMINATION: Gen: sedated , paralysed, appears older than age, , on vent HEENT: NCAT, ETT in place PULM: BS bilaterally equal CV: Tachy, regular, no mgr AB: BS infrequent, soft, nontender Ext: cool, no  edema Neuro: sedated , paralysed,  LABS: Cbc  Lab 06/20/12 0506 06/19/12 2159 06/19/12 0505 06/19/12 0338  WBC 24.5* -- -- --  HGB 15.9 16.0 12.9* --  HCT 46.4 45.6 38.0* --  PLT 222 225 -- 231   Chemistry  Lab 06/20/12 0506 06/20/12 0210 06/19/12 2159  NA 138 141 142  K 4.5 4.3 3.4*  CL 106 107 108  CO2 21 22 22   BUN 20 20 19   CREATININE 0.97 0.97 0.97  CALCIUM 8.4 8.4 8.6  MG -- -- --  PHOS -- -- --  GLUCOSE 138* 150* 168*   Liver fxn  Lab 06/19/12 0338  AST 94*  ALT 81*  ALKPHOS 82  BILITOT 0.3  PROT 6.8  ALBUMIN 3.9   coags  Lab 06/19/12 1200 06/19/12 0645 06/19/12 0338  APTT 58* 106* 34  INR 1.06 1.79* 0.99   Sepsis markers No results found for this basename: LATICACIDVEN:3,PROCALCITON:3 in the last 168 hours Cardiac markers  Lab 06/19/12 0339  CKTOTAL 416*  CKMB 32.3*  TROPONINI 3.15*   BNP No results found for this basename: PROBNP:3 in the last 168 hours ABG  Lab 06/19/12 0516 06/19/12 0505 06/19/12 0345  PHART 7.134* -- --  PCO2ART 53.5* -- --  PO2ART 117.0* -- --  HCO3 18.0* -- --  TCO2 20 18 21    CBG trend  Lab 06/20/12 0755 06/20/12 0601 06/20/12 0357 06/20/12 0149 06/19/12 2348  GLUCAP 130* 118* 117* 138* 155*   IMAGING: 12/20 ETT 4.5 cm above carina, CVL in place, possible bronchitis  ECG: Sinus tach, ST elevation in Lateral precordial leads  DIAGNOSES: Principal Problem:  *STEMI (ST elevation myocardial infarction) Active Problems:  Cardiac arrest  Acute respiratory failure  Ventricular fibrillation  Acute encephalopathy  Leukocytosis  Tobacco abuse  Cardiogenic shock  ASSESSMENT / PLAN:  PULMONARY  ASSESSMENT: 1) Acute respiratory failure due to cardiac arrest 2) Likely bronchitis, secretions from tube, apparently purchased OTC cold drugs just prior to cardiac arrest;  PLAN:   -Full vent support. -ABG/CXR daily. -SBT/WUA after rewarming.   CARDIOVASCULAR  ASSESSMENT:  1) STEMI; major known risk factor  for CAD is tobacco abuse 2) Cardiac arrest 3) cardiogenic shock on IABP & pressors PLAN:  -Induced hypothermia. -wean IABP once on lower pressors - await echo   RENAL  ASSESSMENT:  Oliguria 1) Hypokalemia PLAN:   -Repeat BMET.  GASTROINTESTINAL  ASSESSMENT:   1) No acute issues PLAN:   -Stress ulcer prophylaxis. -OG tube. -Start TF once warmed.  HEMATOLOGIC  ASSESSMENT:   1) No acute issues PLAN:  -Monitor for bleeding.  INFECTIOUS  ASSESSMENT:   1) Acute bronchitis PLAN:   Empiric unasyn - leucocytosis, low threshold to stop  ENDOCRINE  ASSESSMENT:   1) no acute issues  PLAN:   -Monitor glucose  NEUROLOGIC  ASSESSMENT:   1) Acute encephalopathy/comatose from cardiac arrest, high risk for anoxic brain injury PLAN:   -Induced hypothermia protocol - rewarm by 11 pm tonight  CLINICAL SUMMARY:   I have personally obtained a history, examined the patient, evaluated laboratory and imaging results, formulated the assessment and plan and placed orders.  CRITICAL CARE: The patient is critically ill with multiple organ systems failure and requires high complexity decision making for assessment and support, frequent evaluation and titration of therapies, application of advanced monitoring technologies and extensive interpretation of multiple databases. Critical Care Time devoted to patient care services described in this note is 40 minutes.   Oretha Milch., MD Pulmonary and Critical Care Medicine [facility] Pager: [phone]  06/20/2012, 9:09 AM

## 2012-06-20 NOTE — Progress Notes (Signed)
COAGULATION - Follow Up CONSULT NOTE  Pharmacy Consult for Heparin Indication:  Anticoagulation with IABP in place  No Known Allergies  Patient Measurements: Height: 5\' 9"  (175.3 cm) Weight: 196 lb 13.9 oz (89.3 kg) IBW/kg (Calculated) : 70.7  Heparin Dosing Weight: 88.6 kg  Vital Signs: Temp: 93.2 F (34 C) (12/21 1604) Temp src: Core (Comment) (12/21 0600) BP: 111/67 mmHg (12/21 1252) Pulse Rate: 86  (12/21 1552)  Labs:  Basename 06/20/12 1500 06/20/12 1240 06/20/12 1215 06/20/12 1105 06/20/12 1000 06/20/12 0506 06/19/12 2159 06/19/12 1200 06/19/12 0645 06/19/12 0339 06/19/12 0338  HGB -- -- 16.3 -- -- 15.9 -- -- -- -- --  HCT -- -- 48.0 -- -- 46.4 45.6 -- -- -- --  PLT -- -- -- -- -- 222 225 -- -- -- 231  APTT -- -- -- -- -- -- -- 58* 106* -- 34  LABPROT -- -- -- -- -- -- -- 13.7 20.2* -- 13.0  INR -- -- -- -- -- -- -- 1.06 1.79* -- 0.99  HEPARINUNFRC 0.15* -- -- -- 0.25* 0.22* -- -- -- -- --  CREATININE -- 0.97 1.00 0.87 -- -- -- -- -- -- --  CKTOTAL -- -- -- -- -- -- -- -- -- 416* --  CKMB -- -- -- -- -- -- -- -- -- 32.3* --  TROPONINI -- -- -- -- -- -- -- -- -- 3.15* --    Estimated Creatinine Clearance: 95.1 ml/min (by C-G formula based on Cr of 0.97).   Medications:  Infusions:     . [EXPIRED] sodium chloride Stopped (06/20/12 0643)  . sodium chloride 30 mL/hr at 06/20/12 0700  . cisatracurium (NIMBEX) infusion 1.167 mcg/kg/min (06/20/12 0800)  . DOPamine 3 mcg/kg/min (06/20/12 1525)  . fentaNYL infusion INTRAVENOUS 200 mcg/hr (06/20/12 1549)  . heparin 1,100 Units/hr (06/20/12 1059)  . midazolam (VERSED) infusion 6 mg/hr (06/19/12 2143)  . norepinephrine (LEVOPHED) Adult infusion 30 mcg/min (06/20/12 1213)  . [DISCONTINUED] eptifibatide Stopped (06/19/12 1943)    Assessment: 56 y.o. M who presented to the [facility] on 12/20, had witness VFib arrest, STEMI, emergent cath with BMS placed in the LAD along with IABP and stabilized on the hypothermia  protocol.  The patient also continues on heparin for anticoagulation with the IABP still in place. Heparin level is below goal during rewarming phase (HL 0.15, goal of 0.2-0.5).   Goal of Therapy:  Heparin Level 0.2-0.5 units/ml (with IABP in place) Monitor platelets by anticoagulation protocol: Yes   Plan:  -Increase heparin to 1300 units/hr -Heparin level in 4 hours  Harland German, Pharm D 06/20/2012 4:44 PM

## 2012-06-20 NOTE — Progress Notes (Signed)
SUBJECTIVE:  Intubated and sedated on hypothermia protocol  OBJECTIVE:   Vitals:   Filed Vitals:   06/20/12 0500 06/20/12 0600 06/20/12 0643 06/20/12 0700  BP: 138/97   138/83  Pulse: 71 63  72  Temp: 90.9 F (32.7 C) 90.5 F (32.5 C)  91.2 F (32.9 C)  TempSrc: Core (Comment) Core (Comment)    Resp: 11 0 6 10  Height:      Weight:      SpO2: 100% 100%  100%   I&O's:   Intake/Output Summary (Last 24 hours) at 06/20/12 0802 Last data filed at 06/20/12 0700  Gross per 24 hour  Intake 3027.42 ml  Output   1269 ml  Net 1758.42 ml   TELEMETRY: Reviewed telemetry pt in NSR:  PHYSICAL EXAM General: Well developed, well nourished, in no acute distress Head: Eyes PERRLA, No xanthomas.   Normal cephalic and atramatic  Lungs:   Clear bilaterally to auscultation and percussion. Heart:   HRRR S1 S2 Pulses are 2+ & equal. Abdomen: Bowel sounds are positive, abdomen soft and non-tender without masses Extremities:   No clubbing, cyanosis or edema.  DP +1   LABS: Basic Metabolic Panel:  Basename 06/20/12 0506 06/20/12 0210  NA 138 141  K 4.5 4.3  CL 106 107  CO2 21 22  GLUCOSE 138* 150*  BUN 20 20  CREATININE 0.97 0.97  CALCIUM 8.4 8.4  MG -- --  PHOS -- --   Liver Function Tests:  [facility] 06/19/12 0338  AST 94*  ALT 81*  ALKPHOS 82  BILITOT 0.3  PROT 6.8  ALBUMIN 3.9   No results found for this basename: LIPASE:2,AMYLASE:2 in the last 72 hours CBC:  Basename 06/20/12 0506 06/19/12 2159 06/19/12 0338  WBC 24.5* 23.8* --  NEUTROABS -- -- 15.9*  HGB 15.9 16.0 --  HCT 46.4 45.6 --  MCV 94.7 93.6 --  PLT 222 225 --   Cardiac Enzymes:  Basename 06/19/12 0339  CKTOTAL 416*  CKMB 32.3*  CKMBINDEX --  TROPONINI 3.15*    Coag Panel:   Lab Results  Component Value Date   INR 1.06 06/19/2012   INR 1.79* 06/19/2012   INR 0.99 06/19/2012    RADIOLOGY: Dg Chest Portable 1 View  06/19/2012  *RADIOLOGY REPORT*  Clinical Data: Post CPR, evaluate support  devices  PORTABLE CHEST - 1 VIEW  Comparison: None.  Findings: Endotracheal tube tip projects 4.5 cm proximal to the carina.  Left IJ catheter tip projects over the left brachiocephalic vein in the SVC confluence.  Heart size upper normal to mildly enlarged.  Interstitial prompts / peribronchial thickening.  NG tube descends into the abdomen, tip just below the GE junction.  Side port is just above the GE junction.  No pleural effusion or pneumothorax.  No acute osseous finding. Nondisplaced rib fractures related to resuscitation not excluded.  IMPRESSION: Endotracheal tube tip 4.5 cm proximal to the carina.  NG tube side port is above the GE junction, therefore advancement recommend.  Left IJ catheter tip projects over the left brachiocephalic vein near the SVC confluence.  No pneumothorax.  Interstitial prominence and peribronchial thickening may reflect acute edema or bronchitis versus chronic bronchitic change.   Original Report Authenticated By: Jearld Lesch, M.D.       ASSESSMENT:  1.  Anterior STEMI s/p cath with successful PCI of proximal occluded LAD 2.  Cardiogenic shock currently on pressors and IABP 3.  Cardiac arrest secondary to #1 now on hypothermia  protocol 4.  Tobacco abuse  PLAN:   1.  Rewarming to begin today 2.  Wean pressors as BP tolerates 3.  Wean IABP to off tomorrow after rewarming complete 4.  Continue IV Heparin gtt/ASA/Plavix and beta blocker  Quintella Reichert, MD  06/20/2012  8:02 AM

## 2012-06-20 NOTE — Progress Notes (Signed)
Name: Juanita Devincent MRN: [id number] DOB: 07/05/1955  ELECTRONIC ICU PHYSICIAN NOTE  Problem:  Abn abgs  Pt hypercarbic but not air trapping and no ards   Intervention:  Increase rate to 22 and VT to 550 and recheck abg's one hour later  Sandrea Hughs 06/20/2012, 3:29 PM

## 2012-06-20 NOTE — Progress Notes (Signed)
ANTIBIOTIC - Initial & COAGULATION - Follow Up CONSULT NOTE  Pharmacy Consult for Unasyn + Heparin Indication: Empiric bronchitis/asp PNA coverage + Anticoagulation with IABP in place  No Known Allergies  Patient Measurements: Height: 5\' 9"  (175.3 cm) Weight: 196 lb 13.9 oz (89.3 kg) IBW/kg (Calculated) : 70.7  Heparin Dosing Weight: 88.6 kg  Vital Signs: Temp: 91.8 F (33.2 C) (12/21 0900) Temp src: Core (Comment) (12/21 0600) BP: 138/83 mmHg (12/21 0700) Pulse Rate: 70  (12/21 0903)  Labs:  Basename 06/20/12 0506 06/20/12 0210 06/19/12 2159 06/19/12 1425 06/19/12 1200 06/19/12 0645 06/19/12 0505 06/19/12 0339 06/19/12 0338  HGB 15.9 -- 16.0 -- -- -- -- -- --  HCT 46.4 -- 45.6 -- -- -- 38.0* -- --  PLT 222 -- 225 -- -- -- -- -- 231  APTT -- -- -- -- 58* 106* -- -- 34  LABPROT -- -- -- -- 13.7 20.2* -- -- 13.0  INR -- -- -- -- 1.06 1.79* -- -- 0.99  HEPARINUNFRC 0.22* -- 0.16* <0.10* -- -- -- -- --  CREATININE 0.97 0.97 0.97 -- -- -- -- -- --  CKTOTAL -- -- -- -- -- -- -- 416* --  CKMB -- -- -- -- -- -- -- 32.3* --  TROPONINI -- -- -- -- -- -- -- 3.15* --    Estimated Creatinine Clearance: 95.1 ml/min (by C-G formula based on Cr of 0.97).   Medications:  Infusions:    . [EXPIRED] sodium chloride Stopped (06/20/12 0643)  . sodium chloride 30 mL/hr at 06/20/12 0700  . cisatracurium (NIMBEX) infusion 1.167 mcg/kg/min (06/20/12 0800)  . DOPamine 2 mcg/kg/min (06/20/12 0845)  . fentaNYL infusion INTRAVENOUS 200 mcg/hr (06/20/12 0306)  . heparin 1,100 Units/hr (06/19/12 2256)  . midazolam (VERSED) infusion 6 mg/hr (06/19/12 2143)  . norepinephrine (LEVOPHED) Adult infusion 30 mcg/min (06/20/12 0219)  . [DISCONTINUED] dextrose    . [DISCONTINUED] eptifibatide Stopped (06/19/12 1943)  . [DISCONTINUED] insulin (NOVOLIN-R) infusion    . [DISCONTINUED] norepinephrine (LEVOPHED) Adult infusion 5 mcg/min (06/19/12 7846)   Anti-infectives     Start     Dose/Rate Route  Frequency Ordered Stop   06/20/12 1100   Ampicillin-Sulbactam (UNASYN) 3 g in sodium chloride 0.9 % 100 mL IVPB        3 g 100 mL/hr over 60 Minutes Intravenous 4 times per day 06/20/12 9629            Assessment: 56 y.o. M who presented to the [facility] on 12/20, had witness VFib arrest, STEMI, emergent cath with BMS placed in the LAD along with IABP and stabilized on the hypothermia protocol.  Pharmacy was consulted to start Unasyn for empiric bronchitis/aspiration PNA coverage this morning in the setting of leukocytosis. SCr 0.97, CrCl~85-95 ml/min  The patient also continues on heparin for anticoagulation with the IABP still in place. Integrilin was started yesterday afternoon due to high clot burden however stopped in the evening due to blood saturating the L-groin dressing and blood-tinged urine in foley. Heparin level this morning prior to rewarming is therapeutic (HL 0.25, goal of 0.2-0.5). Hgb/Hct/Plt stable. Will check another HL around ~4 hours after rewarming initiated   Goal of Therapy:  Heparin Level 0.2-0.5 units/ml (with IABP in place) Monitor platelets by anticoagulation protocol: Yes Proper antibiotics for infection/cultures adjusted for renal/hepatic function    Plan:  1. Start Unasyn 3g IV every 6 hours 2. Continue heparin at current rate of 1100 units/hr (11 ml/hr) 3. Will recheck another heparin level  at 1500 today (~4 hours after rewarming initiated) 4. Will continue to follow renal function, culture results, LOT, and antibiotic de-escalation plans   Georgina Pillion, PharmD, BCPS Clinical Pharmacist Pager: [phone] 06/20/2012 9:46 AM

## 2012-06-20 NOTE — Progress Notes (Signed)
ANTICOAGULATION CONSULT NOTE - Follow Up Consult  Pharmacy Consult for heparin Indication: STEMI/IABP  Labs:  Basename 06/20/12 0506 06/20/12 0210 06/19/12 2159 06/19/12 1425 06/19/12 1200 06/19/12 0645 06/19/12 0505 06/19/12 0339 06/19/12 0338  HGB 15.9 -- 16.0 -- -- -- -- -- --  HCT 46.4 -- 45.6 -- -- -- 38.0* -- --  PLT 222 -- 225 -- -- -- -- -- 231  APTT -- -- -- -- 58* 106* -- -- 34  LABPROT -- -- -- -- 13.7 20.2* -- -- 13.0  INR -- -- -- -- 1.06 1.79* -- -- 0.99  HEPARINUNFRC 0.22* -- 0.16* <0.10* -- -- -- -- --  CREATININE 0.97 0.97 0.97 -- -- -- -- -- --  CKTOTAL -- -- -- -- -- -- -- 416* --  CKMB -- -- -- -- -- -- -- 32.3* --  TROPONINI -- -- -- -- -- -- -- 3.15* --     Assessment/Plan: 56yo male now therapeutic on heparin after rate increases with low goal due to IABP; noted that pt was bleeding steadily from groin which has resolved since Integrilin has been off.  Scheduled to begin rewarming this am at 1100.  Will continue gtt at current rate for now and obtain additional level prior ro rewarm to assess whether to begin adjusting more aggressively.   Colleen Can PharmD BCPS 06/20/2012,7:02 AM

## 2012-06-21 ENCOUNTER — Inpatient Hospital Stay (HOSPITAL_COMMUNITY): Payer: BC Managed Care – PPO

## 2012-06-21 LAB — URINE MICROSCOPIC-ADD ON

## 2012-06-21 LAB — BASIC METABOLIC PANEL
BUN: 18 mg/dL (ref 6–23)
BUN: 19 mg/dL (ref 6–23)
CO2: 20 mEq/L (ref 19–32)
CO2: 21 mEq/L (ref 19–32)
Calcium: 8.3 mg/dL — ABNORMAL LOW (ref 8.4–10.5)
Calcium: 8.3 mg/dL — ABNORMAL LOW (ref 8.4–10.5)
Chloride: 107 mEq/L (ref 96–112)
Chloride: 106 mEq/L (ref 96–112)
Creatinine, Ser: 0.9 mg/dL (ref 0.50–1.35)
Creatinine, Ser: 1.14 mg/dL (ref 0.50–1.35)
GFR calc Af Amer: >90 mL/min (ref >90)
GFR calc Af Amer: 82 mL/min — ABNORMAL LOW (ref >90)
GFR calc non Af Amer: >90 mL/min (ref >90)
GFR calc non Af Amer: 71 mL/min — ABNORMAL LOW (ref >90)
Glucose, Bld: 91 mg/dL (ref 70–99)
Glucose, Bld: 123 mg/dL — ABNORMAL HIGH (ref 70–99)
Potassium: 5.1 mEq/L (ref 3.5–5.1)
Potassium: 4 mEq/L (ref 3.5–5.1)
Sodium: 139 mEq/L (ref 135–145)
Sodium: 139 mEq/L (ref 135–145)

## 2012-06-21 LAB — BLOOD GAS, ARTERIAL
Acid-base deficit: 3.3 mmol/L — ABNORMAL HIGH (ref 0.0–2.0)
Bicarbonate: 21.2 mEq/L (ref 20.0–24.0)
Drawn by: 100061
FIO2: 0.4 %
MECHVT: 550 mL
O2 Saturation: 98.4 %
PEEP: 5 cmH2O
Patient temperature: 102.3
RATE: 22 resp/min
TCO2: 22.4 mmol/L (ref 0–100)
pCO2 arterial: 42.8 mmHg (ref 35.0–45.0)
pH, Arterial: 7.328 — ABNORMAL LOW (ref 7.350–7.450)
pO2, Arterial: 110 mmHg — ABNORMAL HIGH (ref 80.0–100.0)

## 2012-06-21 LAB — URINALYSIS, ROUTINE W REFLEX MICROSCOPIC
Glucose, UA: NEGATIVE mg/dL
Ketones, ur: 15 mg/dL — AB
Leukocytes, UA: NEGATIVE
Nitrite: NEGATIVE
Protein, ur: 100 mg/dL — AB
Specific Gravity, Urine: 1.028 (ref 1.005–1.030)
Urobilinogen, UA: 1 mg/dL (ref 0.0–1.0)
pH: 5.5 (ref 5.0–8.0)

## 2012-06-21 LAB — HEPARIN LEVEL (UNFRACTIONATED)
Heparin Unfractionated: 0.14 IU/mL — ABNORMAL LOW (ref 0.30–0.70)
Heparin Unfractionated: 0.27 IU/mL — ABNORMAL LOW (ref 0.30–0.70)
Heparin Unfractionated: 0.49 IU/mL (ref 0.30–0.70)

## 2012-06-21 LAB — PROCALCITONIN: Procalcitonin: 2.69 ng/mL

## 2012-06-21 LAB — GLUCOSE, CAPILLARY
Glucose-Capillary: 112 mg/dL — ABNORMAL HIGH (ref 70–99)
Glucose-Capillary: 115 mg/dL — ABNORMAL HIGH (ref 70–99)
Glucose-Capillary: 118 mg/dL — ABNORMAL HIGH (ref 70–99)
Glucose-Capillary: 127 mg/dL — ABNORMAL HIGH (ref 70–99)
Glucose-Capillary: 85 mg/dL (ref 70–99)

## 2012-06-21 LAB — CBC
HCT: 43.4 % (ref 39.0–52.0)
Hemoglobin: 15 g/dL (ref 13.0–17.0)
MCH: 32.4 pg (ref 26.0–34.0)
MCHC: 34.6 g/dL (ref 30.0–36.0)
MCV: 93.7 fL (ref 78.0–100.0)
Platelets: 160 10*3/uL (ref 150–400)
RBC: 4.63 MIL/uL (ref 4.22–5.81)
RDW: 14.1 % (ref 11.5–15.5)
WBC: 19.4 10*3/uL — ABNORMAL HIGH (ref 4.0–10.5)

## 2012-06-21 MED ORDER — JEVITY 1.2 CAL PO LIQD
1000.0000 mL | Freq: Every day | ORAL | Status: DC
Start: 2012-06-21 — End: 2012-06-23
  Administered 2012-06-21: 20 mL/h
  Administered 2012-06-22: 1000 mL
  Filled 2012-06-21 (×5): qty 1000

## 2012-06-21 MED ORDER — ACETAMINOPHEN 325 MG PO TABS: 650.0000 mg | ORAL_TABLET | ORAL | Status: DC | PRN

## 2012-06-21 MED ORDER — FENTANYL CITRATE 0.05 MG/ML IJ SOLN
25.0000 ug | INTRAMUSCULAR | Status: DC | PRN
  Administered 2012-06-22 – 2012-06-23 (×7): 50 ug via INTRAVENOUS
  Filled 2012-06-21 (×7): qty 2

## 2012-06-21 MED ORDER — MIDAZOLAM HCL 2 MG/2ML IJ SOLN
2.0000 mg | INTRAMUSCULAR | Status: DC | PRN
  Administered 2012-06-21 – 2012-06-22 (×4): 2 mg via INTRAVENOUS
  Administered 2012-06-22 (×2): 4 mg via INTRAVENOUS
  Administered 2012-06-22 – 2012-06-23 (×4): 2 mg via INTRAVENOUS
  Filled 2012-06-21 (×7): qty 2
  Filled 2012-06-21: qty 4
  Filled 2012-06-21: qty 2
  Filled 2012-06-21: qty 4

## 2012-06-21 MED ORDER — ACETAMINOPHEN 160 MG/5ML PO SOLN
650.0000 mg | ORAL | Status: DC | PRN
  Administered 2012-06-21: 650 mg via ORAL
  Filled 2012-06-21: qty 20.3

## 2012-06-21 MED ORDER — PIPERACILLIN-TAZOBACTAM 3.375 G IVPB
3.3750 g | Freq: Three times a day (TID) | INTRAVENOUS | Status: DC
  Administered 2012-06-21 – 2012-06-24 (×10): 3.375 g via INTRAVENOUS
  Filled 2012-06-21 (×12): qty 50

## 2012-06-21 MED ORDER — HEPARIN (PORCINE) IN NACL 100-0.45 UNIT/ML-% IJ SOLN
1700.0000 [IU]/h | INTRAMUSCULAR | Status: DC
  Administered 2012-06-21 – 2012-06-22 (×2): 1700 [IU]/h via INTRAVENOUS
  Filled 2012-06-21 (×3): qty 250

## 2012-06-21 MED ORDER — VANCOMYCIN HCL 10 G IV SOLR
1250.0000 mg | Freq: Two times a day (BID) | INTRAVENOUS | Status: DC
  Administered 2012-06-21 – 2012-06-23 (×4): 1250 mg via INTRAVENOUS
  Filled 2012-06-21 (×6): qty 1250

## 2012-06-21 MED ORDER — VANCOMYCIN HCL IN DEXTROSE 1-5 GM/200ML-% IV SOLN
1000.0000 mg | Freq: Three times a day (TID) | INTRAVENOUS | Status: DC
  Administered 2012-06-21: 1000 mg via INTRAVENOUS
  Filled 2012-06-21 (×3): qty 200

## 2012-06-21 NOTE — Progress Notes (Signed)
PULMONARY  / CRITICAL CARE MEDICINE  Name: [name] Walker MRN: [id number] DOB: [birth year]    LOS: 2  REFERRING MD :  [facility]  CHIEF COMPLAINT:  Cardiac arrest  BRIEF PATIENT DESCRIPTION: 56 y/o male smoker with no known past medical history presented on 06/19/2012 to the [facility] ED in cardiac arrest in the setting of AW- STEMI.  Underwent 30 minutes of CPR and s/p  successful PCI of proximal occluded LAD.  Cardiogenic shock requiring pressors and IABP    LINES / TUBES: 12/20 ETT >> 12/20 L IJ CVL >>  CULTURES: 12/20 rapid flu >>neg  ANTIBIOTICS: Zosyn 12/21 (hcap) vanco 12/21 (hcap)  SIGNIFICANT EVENTS:  Remains on vent.  Not weanable. On vasopressors.  LEVEL OF CARE:  ICU PRIMARY SERVICE:  Cardiology CONSULTANTS:  PCCM CODE STATUS DIET:  NPO DVT Px:  heparin GI Px:  pepcid   VITAL SIGNS: Temp:  [90.9 F (32.7 C)-102.4 F (39.1 C)] 101.3 F (38.5 C) (12/22 1000) Pulse Rate:  [28-126] 113  (12/22 1000) Resp:  [0-27] 21  (12/22 1000) BP: (109-111)/(67-71) 109/71 mmHg (12/21 1720) SpO2:  [96 %-100 %] 98 % (12/22 1000) Arterial Line BP: (79-140)/(51-101) 119/68 mmHg (12/22 1000) FiO2 (%):  [40 %-98 %] 40 % (12/22 0731) Weight:  [92.6 kg (204 lb 2.3 oz)] 92.6 kg (204 lb 2.3 oz) (12/22 0500) HEMODYNAMICS: CVP:  [11 mmHg-14 mmHg] 14 mmHg VENTILATOR SETTINGS: Vent Mode:  [-] PRVC FiO2 (%):  [40 %-98 %] 40 % Set Rate:  [20 bmp-22 bmp] 22 bmp Vt Set:  [450 mL-550 mL] 550 mL PEEP:  [5 cmH20] 5 cmH20 Plateau Pressure:  [15 cmH20-24 cmH20] 16 cmH20 Trapping air on vent.  Itime reduced from 0.9 to 0.75   RR reduced to 14 from 22 and Vee then reduces from 8 to 0-1 INTAKE / OUTPUT: Intake/Output      12/21 0701 - 12/22 0700 12/22 0701 - 12/23 0700   I.V. (mL/kg) 2006.4 (21.7) 158.2 (1.7)   NG/GT 20    IV Piggyback 725 12.5   Total Intake(mL/kg) 2751.4 (29.7) 170.7 (1.8)   Urine (mL/kg/hr) 1058 (0.5) 200   Emesis/NG output 65    Total Output 1123 200   Net +1628.4  -29.3         PHYSICAL EXAMINATION: Gen:off NMBs, appears older than age,  on vent HEENT: NCAT, ETT in place PULM: BS bilaterally equal CV: Tachy, regular, no mgr AB: BS infrequent, soft, nontender Ext: cool, no edema Neuro: sedated , paralysed,  LABS: Cbc  Lab 06/21/12 0211 06/20/12 1815 06/20/12 1215 06/20/12 0506 06/19/12 2159  WBC 19.4* -- -- -- --  HGB 15.0 15.6 16.3 -- --  HCT 43.4 46.0 48.0 -- --  PLT 160 -- -- 222 225   Chemistry  Lab 06/21/12 0920 06/21/12 0147 06/20/12 2100  NA 139 139 140  K 4.0 5.1 4.0  CL 106 107 108  CO2 21 20 20   BUN 19 18 18   CREATININE 1.14 0.90 0.83  CALCIUM 8.3* 8.3* 8.4  MG -- -- --  PHOS -- -- --  GLUCOSE 123* 91 121*   Liver fxn  Lab 06/19/12 0338  AST 94*  ALT 81*  ALKPHOS 82  BILITOT 0.3  PROT 6.8  ALBUMIN 3.9   coags  Lab 06/19/12 1200 06/19/12 0645 06/19/12 0338  APTT 58* 106* 34  INR 1.06 1.79* 0.99   Sepsis markers  Lab 06/21/12 0342  LATICACIDVEN --  PROCALCITON 2.69   Cardiac markers  Lab 06/19/12 0339  CKTOTAL 416*  CKMB 32.3*  TROPONINI 3.15*   BNP No results found for this basename: PROBNP:3 in the last 168 hours ABG  Lab 06/21/12 0405 06/20/12 1815 06/20/12 1718 06/20/12 1511  PHART 7.328* -- 7.286* 7.246*  PCO2ART 42.8 -- 43.9 51.1*  PO2ART 110.0* -- 52.0* 65.0*  HCO3 21.2 -- 21.5 23.1  TCO2 22.4 22 23  --   CBG trend  Lab 06/21/12 0752 06/21/12 0358 06/20/12 2340 06/20/12 1940 06/20/12 1602  GLUCAP 115* 85 94 106* 112*   IMAGING: 12/20 ETT 4.5 cm above carina, CVL in place, possible bronchitis  ECG: Sinus tach, ST elevation in Lateral precordial leads  DIAGNOSES: Principal Problem:  *STEMI (ST elevation myocardial infarction) Active Problems:  Cardiac arrest  Acute respiratory failure  Ventricular fibrillation  Acute encephalopathy  Leukocytosis  Tobacco abuse  Cardiogenic shock  ASSESSMENT / PLAN:  PULMONARY  ASSESSMENT: 1) Acute respiratory failure due to cardiac  arrest 2) Likely bronchitis, secretions from tube, apparently purchased OTC cold drugs just prior to cardiac arrest;  3)airtrapping on vent with subsequent autopeep contributing to shock state  PLAN:   -Full vent support. -reduce rate and Itime and change to - trigger sensitivity to reduce autopeep effect -ABG/CXR daily. -SBT/WUA daily   CARDIOVASCULAR  ASSESSMENT:  1) STEMI; major known risk factor for CAD is tobacco abuse 2) Cardiac arrest 3) cardiogenic shock on IABP & pressors Now off hypothermia PLAN:   -per cardiology -echo not read yet from 11/21   RENAL  ASSESSMENT:  Oliguria improved 1) Hypokalemia resolved  PLAN:   .bmet daily  GASTROINTESTINAL  ASSESSMENT:   1) No acute issues PLAN:   -Stress ulcer prophylaxis. -OG tube. -Start TF 12/22 today  HEMATOLOGIC  ASSESSMENT:   1) No acute issues PLAN:  -Monitor for bleeding.  INFECTIOUS  ASSESSMENT:   1) Acute bronchitis 2) fever  PLAN:   Expanded to zosyn/vanco and unasyn d/c d/t fever after hypothermia resolved Give IV tylenol   ENDOCRINE  ASSESSMENT:   1) no acute issues  PLAN:   -Monitor glucose  NEUROLOGIC  ASSESSMENT:   1) Acute encephalopathy/comatose from cardiac arrest, high risk for anoxic brain injury -had down time.  Still unresponsive after rewarming. Anoxic encephalopathy PLAN:   -monitor neuro -prn sedation   CLINICAL SUMMARY:   I have personally obtained a history, examined the patient, evaluated laboratory and imaging results, formulated the assessment and plan and placed orders.  CRITICAL CARE: The patient is critically ill with multiple organ systems failure and requires high complexity decision making for assessment and support, frequent evaluation and titration of therapies, application of advanced monitoring technologies and extensive interpretation of multiple databases. Critical Care Time devoted to patient care services described in this note is 40  minutes.   Shan Levans, MD Pulmonary and Critical Care Medicine [facility]  [phone]  Cell  [phone]  If no response or cell goes to voicemail, call beeper [phone]    06/21/2012, 10:31 AM

## 2012-06-21 NOTE — Progress Notes (Addendum)
ANTIBIOTIC - Initial & COAGULATION - Follow Up CONSULT NOTE  Pharmacy Consult for Heparin  Indication: Anticoagulation with IABP in place  No Known Allergies  Patient Measurements: Height: 5\' 9"  (175.3 cm) Weight: 204 lb 2.3 oz (92.6 kg) IBW/kg (Calculated) : 70.7  Heparin Dosing Weight: 88.6 kg  Vital Signs: Temp: 99.9 F (37.7 C) (12/22 1629) Temp src: Core (Comment) (12/22 0700) Pulse Rate: 83  (12/22 1629)  Labs:  Basename 06/21/12 1540 06/21/12 0920 06/21/12 0211 06/21/12 0147 06/21/12 0145 06/20/12 2100 06/20/12 1815 06/20/12 1215 06/20/12 0506 06/19/12 2159 06/19/12 1200 06/19/12 0645 06/19/12 0339 06/19/12 0338  HGB -- -- 15.0 -- -- -- 15.6 -- -- -- -- -- -- --  HCT -- -- 43.4 -- -- -- 46.0 48.0 -- -- -- -- -- --  PLT -- -- 160 -- -- -- -- -- 222 225 -- -- -- --  APTT -- -- -- -- -- -- -- -- -- -- 58* 106* -- 34  LABPROT -- -- -- -- -- -- -- -- -- -- 13.7 20.2* -- 13.0  INR -- -- -- -- -- -- -- -- -- -- 1.06 1.79* -- 0.99  HEPARINUNFRC 0.49 0.27* -- -- 0.14* -- -- -- -- -- -- -- -- --  CREATININE -- 1.14 -- 0.90 -- 0.83 -- -- -- -- -- -- -- --  CKTOTAL -- -- -- -- -- -- -- -- -- -- -- -- 416* --  CKMB -- -- -- -- -- -- -- -- -- -- -- -- 32.3* --  TROPONINI -- -- -- -- -- -- -- -- -- -- -- -- 3.15* --    Estimated Creatinine Clearance: 82.3 ml/min (by C-G formula based on Cr of 1.14).   Medications:  Infusions:     . sodium chloride 20 mL/hr at 06/20/12 2000  . DOPamine Stopped (06/21/12 0000)  . heparin 1,800 Units/hr (06/21/12 0513)  . norepinephrine (LEVOPHED) Adult infusion 34 mcg/min (06/21/12 1412)  . [DISCONTINUED] cisatracurium (NIMBEX) infusion 1.383 mcg/kg/min (06/20/12 2004)  . [DISCONTINUED] cisatracurium (NIMBEX) infusion Stopped (06/20/12 2200)  . [DISCONTINUED] fentaNYL infusion INTRAVENOUS Stopped (06/21/12 0000)  . [DISCONTINUED] heparin 1,300 Units/hr (06/20/12 1650)  . [DISCONTINUED] midazolam (VERSED) infusion Stopped (06/21/12 0000)      Assessment: 56 y.o. M who presented to the [facility] on 12/20, had witness VFib arrest, STEMI, emergent cath with BMS placed in the LAD along with IABP and stabilized on the hypothermia protocol.  The patient continues on heparin for anticoagulation with the IABP still in place. The patient is noted to have completed rewarming around 2300 on 12/22. Heparin level this morning is therapeutic (HL 0.49, goal of 0.2-0.5).    Goal of Therapy:  Heparin Level 0.2-0.5 units/ml (with IABP in place) Monitor platelets by anticoagulation protocol: Yes   Plan:   -Decrease heparin slightly to 1700 units/hr (17 ml/hr) -Heparin level in am  Harland German, Pharm D 06/21/2012 4:52 PM

## 2012-06-21 NOTE — Progress Notes (Signed)
ANTIBIOTIC CONSULT NOTE - INITIAL  Pharmacy Consult for vancomycin and zosyn  Indication:   No Known Allergies  Patient Measurements: Height: 5\' 9"  (175.3 cm) Weight: 196 lb 13.9 oz (89.3 kg) IBW/kg (Calculated) : 70.7  Adjusted Body Weight:   Vital Signs: Temp: 102.2 F (39 C) (12/22 0300) Temp src: Core (Comment) (12/22 0300) BP: 109/71 mmHg (12/21 1720) Pulse Rate: 28  (12/22 0200) Intake/Output from previous day: 12/21 0701 - 12/22 0700 In: 2142.4 [I.V.:1722.4; NG/GT:20; IV Piggyback:400] Out: 1063 [Urine:998; Emesis/NG output:65] Intake/Output from this shift: Total I/O In: 899.2 [I.V.:699.2; IV Piggyback:200] Out: 535 [Urine:535]  Labs:  [facility] 06/21/12 0211 06/21/12 0147 06/20/12 2100 06/20/12 1815 06/20/12 1215 06/20/12 0506 06/19/12 2159  WBC 19.4* -- -- -- -- 24.5* 23.8*  HGB 15.0 -- -- 15.6 16.3 -- --  PLT 160 -- -- -- -- 222 225  LABCREA -- -- -- -- -- -- --  CREATININE -- 0.90 0.83 0.90 -- -- --   Estimated Creatinine Clearance: 102.4 ml/min (by C-G formula based on Cr of 0.9). No results found for this basename: VANCOTROUGH:2,VANCOPEAK:2,VANCORANDOM:2,GENTTROUGH:2,GENTPEAK:2,GENTRANDOM:2,TOBRATROUGH:2,TOBRAPEAK:2,TOBRARND:2,AMIKACINPEAK:2,AMIKACINTROU:2,AMIKACIN:2, in the last 72 hours   Microbiology: Recent Results (from the past 720 hour(s))  MRSA PCR SCREENING     Status: Normal   Collection Time   06/19/12  6:50 AM      Component Value Range Status Comment   MRSA by PCR NEGATIVE  NEGATIVE Final     Medical History: Past Medical History  Diagnosis Date  . Varicose veins   . No pertinent past medical history     Medications:  Prescriptions prior to admission  Medication Sig Dispense Refill  . caffeine (NO DOZ MAXIMUM STRENGTH) 200 MG TABS Take 200 mg by mouth every 4 (four) hours as needed. To stay awake and alert.      Marland Kitchen dextromethorphan-guaiFENesin (MUCINEX DM) 30-600 MG per 12 hr tablet Take 1 tablet by mouth every 12 (twelve) hours.  For congestion.       Assessment: Elevated WBC and fever. resp and blood cx's pending . vanc and zosyn for empiric coverage.   Goal of Therapy:  Vancomycin trough level 15-20 mcg/ml  Plan:  Zosyn 3.375gm iv q8h  Vancomycin 1gm q8h  Trough before 4th dose  Janice Coffin 06/21/2012,3:43 AM

## 2012-06-21 NOTE — Progress Notes (Signed)
ANTICOAGULATION CONSULT NOTE - Follow Up Consult  Pharmacy Consult for heparin Indication: STEMI/IABP  Labs:  Basename 06/21/12 0211 06/21/12 0145 06/20/12 2100 06/20/12 1815 06/20/12 1500 06/20/12 1240 06/20/12 1215 06/20/12 0506 06/19/12 2159 06/19/12 1200 06/19/12 0645 06/19/12 0339 06/19/12 0338  HGB 15.0 -- -- 15.6 -- -- -- -- -- -- -- -- --  HCT 43.4 -- -- 46.0 -- -- 48.0 -- -- -- -- -- --  PLT 160 -- -- -- -- -- -- 222 225 -- -- -- --  APTT -- -- -- -- -- -- -- -- -- 58* 106* -- 34  LABPROT -- -- -- -- -- -- -- -- -- 13.7 20.2* -- 13.0  INR -- -- -- -- -- -- -- -- -- 1.06 1.79* -- 0.99  HEPARINUNFRC -- 0.14* 0.14* -- 0.15* -- -- -- -- -- -- -- --  CREATININE -- -- 0.83 0.90 -- 0.97 -- -- -- -- -- -- --  CKTOTAL -- -- -- -- -- -- -- -- -- -- -- 416* --  CKMB -- -- -- -- -- -- -- -- -- -- -- 32.3* --  TROPONINI -- -- -- -- -- -- -- -- -- -- -- 3.15* --     Assessment: 57yo male remains subtherapeutic on heparin after rate increase with low goal due to IABP, pt now fully rewarmed and requiring higher heparin rate.  Goal of Therapy:  Heparin level 0.2-0.5 units/ml   Plan:  Will increase heparin gtt by 3-4 unit/kg/hr to 1800 units/hr and check level in 6hr.  Colleen Can PharmD BCPS 06/21/2012,2:31 AM

## 2012-06-21 NOTE — Progress Notes (Signed)
50cc of versed wasted from 50ccbag of versed. 125cc wasted from a 250cc bag of fentanyl. Witnessed by two RN's Herminio Heads, RN Clancy Gourd, RN

## 2012-06-21 NOTE — Progress Notes (Signed)
eLink Physician-Brief Progress Note Patient Name: [name] Walker DOB: October 08, 1955 MRN: [id number]  Date of Service  06/21/2012   HPI/Events of Note   Fever, leukocytosis, airspace disease (? aspiration)  eICU Interventions   Cultures, UA, PCT, Vanc / Zosyn per pharm   Intervention Category Intermediate Interventions: Other:  Lonia Farber 06/21/2012, 3:42 AM

## 2012-06-21 NOTE — Progress Notes (Addendum)
SUBJECTIVE:  Still intubated.  Remains on the IABP on pressors.  Spiked temp ? Aspiration PNA  OBJECTIVE:   Vitals:   Filed Vitals:   06/21/12 0614 06/21/12 0700 06/21/12 0726 06/21/12 0727  BP:      Pulse:  102  102  Temp: 101.7 F (38.7 C) 101.8 F (38.8 C)  101.8 F (38.8 C)  TempSrc:  Core (Comment)    Resp: 22 22  22   Height:      Weight:      SpO2:  99% 99% 98%   I&O's:   Intake/Output Summary (Last 24 hours) at 06/21/12 0805 Last data filed at 06/21/12 0700  Gross per 24 hour  Intake 2645.89 ml  Output   1098 ml  Net 1547.89 ml   TELEMETRY: Reviewed telemetry pt in NSR:     PHYSICAL EXAM  Lungs:   Clear bilaterally to auscultation and percussion. Heart:   HRRR S1 S2 Pulses are 2+ & equal. Abdomen: Bowel sounds are positive, abdomen soft and non-tender without masses Extremities:   No clubbing, cyanosis or edema.  DP +1 Neuro: sedated  LABS: Basic Metabolic Panel:  Basename 06/21/12 0147 06/20/12 2100  NA 139 140  K 5.1 4.0  CL 107 108  CO2 20 20  GLUCOSE 91 121*  BUN 18 18  CREATININE 0.90 0.83  CALCIUM 8.3* 8.4  MG -- --  PHOS -- --   Liver Function Tests:  [facility] 06/19/12 0338  AST 94*  ALT 81*  ALKPHOS 82  BILITOT 0.3  PROT 6.8  ALBUMIN 3.9   No results found for this basename: LIPASE:2,AMYLASE:2 in the last 72 hours CBC:  Basename 06/21/12 0211 06/20/12 1815 06/20/12 0506 06/19/12 0338  WBC 19.4* -- 24.5* --  NEUTROABS -- -- -- 15.9*  HGB 15.0 15.6 -- --  HCT 43.4 46.0 -- --  MCV 93.7 -- 94.7 --  PLT 160 -- 222 --   Cardiac Enzymes:  Basename 06/19/12 0339  CKTOTAL 416*  CKMB 32.3*  CKMBINDEX --  TROPONINI 3.15*   Coag Panel:   Lab Results  Component Value Date   INR 1.06 06/19/2012   INR 1.79* 06/19/2012   INR 0.99 06/19/2012    RADIOLOGY: Dg Chest Port 1 View  06/21/2012  *RADIOLOGY REPORT*  Clinical Data: Endotracheal tube positioning  PORTABLE CHEST - 1 VIEW  Comparison: 06/20/2012; 06/19/2012  Findings:   Grossly unchanged cardiac silhouette and mediastinal contours. Stable positioning of support apparatus including tip of intra- aortic balloon pump approximate 1.5 cm from the superior aspect of the aortic arch.  External pacing pads overlie the left heart border.  No pneumothorax.  Pulmonary vasculature remains indistinct.  Grossly unchanged perihilar and medial basilar heterogeneous opacities do not represent atelectasis.  No definite pleural effusion.  Unchanged bones.  IMPRESSION: 1.  Stable positioning of support apparatus.  No pneumothorax. 2.  Grossly unchanged findings of pulmonary edema and bibasilar atelectasis.   Original Report Authenticated By: Tacey Ruiz, MD    Dg Chest Port 1 View  06/20/2012  *RADIOLOGY REPORT*  Clinical Data: New endotracheal tube placement  PORTABLE CHEST - 1 VIEW  Comparison: Portable chest x-ray of 06/19/2012  Findings: The tip of the endotracheal tube is approximately 3.3 cm above the carina.  Aeration has improved.  However, there is new opacity medially at the left lung base suspicious for atelectasis and/or pneumonia and there may be a small left effusion present. Cardiomegaly is stable.  An NG tube is noted into  the stomach.  IMPRESSION:  1.  Tip of endotracheal tube approximately 3.3 cm above the carina. 2.  Increased opacity medially at the left lung base suspicious for atelectasis, pneumonia, and possibly left effusion.   Original Report Authenticated By: Dwyane Dee, M.D.    Dg Chest Portable 1 View  06/19/2012  *RADIOLOGY REPORT*  Clinical Data: Post CPR, evaluate support devices  PORTABLE CHEST - 1 VIEW  Comparison: None.  Findings: Endotracheal tube tip projects 4.5 cm proximal to the carina.  Left IJ catheter tip projects over the left brachiocephalic vein in the SVC confluence.  Heart size upper normal to mildly enlarged.  Interstitial prompts / peribronchial thickening.  NG tube descends into the abdomen, tip just below the GE junction.  Side port is just  above the GE junction.  No pleural effusion or pneumothorax.  No acute osseous finding. Nondisplaced rib fractures related to resuscitation not excluded.  IMPRESSION: Endotracheal tube tip 4.5 cm proximal to the carina.  NG tube side port is above the GE junction, therefore advancement recommend.  Left IJ catheter tip projects over the left brachiocephalic vein near the SVC confluence.  No pneumothorax.  Interstitial prominence and peribronchial thickening may reflect acute edema or bronchitis versus chronic bronchitic change.   Original Report Authenticated By: Jearld Lesch, M.D.    ASSESSMENT:  1. Anterior STEMI s/p cath with successful PCI of proximal occluded LAD  2. Cardiogenic shock still requiring significant pressors and IABP to maintain pressure 3. Cardiac arrest secondary to #1  - off hypothermia protocol 4. Tobacco abuse  PLAN:  1. Wean pressors as BP tolerates  2. Wean IABP off as pressure tolerates  3. Continue IV Heparin gtt/ASA/Plavix  4. Beta blocker stopped due to low BP     Quintella Reichert, MD  06/21/2012  8:05 AM

## 2012-06-21 NOTE — Progress Notes (Signed)
ANTIBIOTIC - Initial & COAGULATION - Follow Up CONSULT NOTE  Pharmacy Consult for Heparin & Vanc/Zosyn Indication: Anticoagulation with IABP in place & Empiric coverage in setting of fevers after rewarming completed  No Known Allergies  Patient Measurements: Height: 5\' 9"  (175.3 cm) Weight: 204 lb 2.3 oz (92.6 kg) IBW/kg (Calculated) : 70.7  Heparin Dosing Weight: 88.6 kg  Vital Signs: Temp: 101.3 F (38.5 C) (12/22 1000) Temp src: Core (Comment) (12/22 0700) Pulse Rate: 113  (12/22 1000)  Labs:  Basename 06/21/12 0920 06/21/12 0211 06/21/12 0147 06/21/12 0145 06/20/12 2100 06/20/12 1815 06/20/12 1215 06/20/12 0506 06/19/12 2159 06/19/12 1200 06/19/12 0645 06/19/12 0339 06/19/12 0338  HGB -- 15.0 -- -- -- 15.6 -- -- -- -- -- -- --  HCT -- 43.4 -- -- -- 46.0 48.0 -- -- -- -- -- --  PLT -- 160 -- -- -- -- -- 222 225 -- -- -- --  APTT -- -- -- -- -- -- -- -- -- 58* 106* -- 34  LABPROT -- -- -- -- -- -- -- -- -- 13.7 20.2* -- 13.0  INR -- -- -- -- -- -- -- -- -- 1.06 1.79* -- 0.99  HEPARINUNFRC 0.27* -- -- 0.14* 0.14* -- -- -- -- -- -- -- --  CREATININE 1.14 -- 0.90 -- 0.83 -- -- -- -- -- -- -- --  CKTOTAL -- -- -- -- -- -- -- -- -- -- -- 416* --  CKMB -- -- -- -- -- -- -- -- -- -- -- 32.3* --  TROPONINI -- -- -- -- -- -- -- -- -- -- -- 3.15* --    Estimated Creatinine Clearance: 82.3 ml/min (by C-G formula based on Cr of 1.14).   Medications:  Infusions:     . sodium chloride 20 mL/hr at 06/20/12 2000  . cisatracurium (NIMBEX) infusion Stopped (06/20/12 2200)  . DOPamine Stopped (06/21/12 0000)  . fentaNYL infusion INTRAVENOUS Stopped (06/21/12 0000)  . heparin 1,800 Units/hr (06/21/12 0513)  . midazolam (VERSED) infusion Stopped (06/21/12 0000)  . norepinephrine (LEVOPHED) Adult infusion 38 mcg/min (06/21/12 1033)  . [DISCONTINUED] cisatracurium (NIMBEX) infusion 1.383 mcg/kg/min (06/20/12 2004)  . [DISCONTINUED] heparin 1,100 Units/hr (06/20/12 1059)  .  [DISCONTINUED] heparin 1,300 Units/hr (06/20/12 1650)   Anti-infectives     Start     Dose/Rate Route Frequency Ordered Stop   06/21/12 0400   vancomycin (VANCOCIN) IVPB 1000 mg/200 mL premix        1,000 mg 200 mL/hr over 60 Minutes Intravenous Every 8 hours 06/21/12 0346     06/21/12 0400  piperacillin-tazobactam (ZOSYN) IVPB 3.375 g       3.375 g 12.5 mL/hr over 240 Minutes Intravenous 3 times per day 06/21/12 0347     06/20/12 1100   Ampicillin-Sulbactam (UNASYN) 3 g in sodium chloride 0.9 % 100 mL IVPB  Status:  Discontinued        3 g 100 mL/hr over 60 Minutes Intravenous 4 times per day 06/20/12 0937 06/21/12 1031          Assessment: 56 y.o. M who presented to the [facility] on 12/20, had witness VFib arrest, STEMI, emergent cath with BMS placed in the LAD along with IABP and stabilized on the hypothermia protocol.  The patient continues on heparin for anticoagulation with the IABP still in place. The patient is noted to have completed rewarming around 2300 on 12/22. Heparin level this morning is therapeutic (HL 0.27, goal of 0.2-0.5).  Hgb/Hct/Plt stable. No s/sx of  bleeding noted.   The patient was also started on Vancomycin + Zosyn this morning for empiric coverage of fevers s/p rewarming from hypothermia protocol. Blood, respiratory, and urine cultures have been drawn. Renal function has worsened slightly this morning, SCr 1.14, CrCl~75-80 ml/min -- will adjust Vancomycin dose. Zosyn dose remains stable.   Goal of Therapy:  Heparin Level 0.2-0.5 units/ml (with IABP in place) Monitor platelets by anticoagulation protocol: Yes Vancomycin trough of 15-20 mcg/ml Proper antibiotics for infection/cultures adjusted for renal/hepatic function    Plan:  1. Continue heparin at current rate of 1800 units/hr (18 ml/hr) 2. Adjust Vancomycin to 1250 mg IV every 12 hours 3. Continue Zosyn 3.375g IV every 8 hours 4. Will continue to monitor for any signs/symptoms of bleeding and will  follow up with heparin level in 6 hours to confirm therapeutic 5. Will continue to follow renal function, culture results, LOT, and antibiotic de-escalation plans   Georgina Pillion, PharmD, BCPS Clinical Pharmacist Pager: [phone] 06/21/2012 10:33 AM

## 2012-06-22 ENCOUNTER — Inpatient Hospital Stay (HOSPITAL_COMMUNITY): Payer: BC Managed Care – PPO

## 2012-06-22 LAB — BLOOD GAS, ARTERIAL
Acid-Base Excess: 0.6 mmol/L (ref 0.0–2.0)
Bicarbonate: 24.4 mEq/L — ABNORMAL HIGH (ref 20.0–24.0)
Drawn by: 10006
FIO2: 0.4 %
MECHVT: 550 mL
O2 Saturation: 98.6 %
PEEP: 5 cmH2O
Patient temperature: 100.3
RATE: 14 resp/min
TCO2: 25.5 mmol/L (ref 0–100)
pCO2 arterial: 38.5 mmHg (ref 35.0–45.0)
pH, Arterial: 7.423 (ref 7.350–7.450)
pO2, Arterial: 97.5 mmHg (ref 80.0–100.0)

## 2012-06-22 LAB — GLUCOSE, CAPILLARY
Glucose-Capillary: 100 mg/dL — ABNORMAL HIGH (ref 70–99)
Glucose-Capillary: 120 mg/dL — ABNORMAL HIGH (ref 70–99)
Glucose-Capillary: 114 mg/dL — ABNORMAL HIGH (ref 70–99)
Glucose-Capillary: 111 mg/dL — ABNORMAL HIGH (ref 70–99)
Glucose-Capillary: 123 mg/dL — ABNORMAL HIGH (ref 70–99)
Glucose-Capillary: 119 mg/dL — ABNORMAL HIGH (ref 70–99)
Glucose-Capillary: 99 mg/dL (ref 70–99)

## 2012-06-22 LAB — CBC
HCT: 33.5 % — ABNORMAL LOW (ref 39.0–52.0)
Hemoglobin: 11.5 g/dL — ABNORMAL LOW (ref 13.0–17.0)
MCH: 31.9 pg (ref 26.0–34.0)
MCHC: 34.3 g/dL (ref 30.0–36.0)
MCV: 93.1 fL (ref 78.0–100.0)
Platelets: 123 10*3/uL — ABNORMAL LOW (ref 150–400)
RBC: 3.6 MIL/uL — ABNORMAL LOW (ref 4.22–5.81)
RDW: 14.2 % (ref 11.5–15.5)
WBC: 14.8 10*3/uL — ABNORMAL HIGH (ref 4.0–10.5)

## 2012-06-22 LAB — BASIC METABOLIC PANEL
BUN: 17 mg/dL (ref 6–23)
CO2: 23 mEq/L (ref 19–32)
Calcium: 8.5 mg/dL (ref 8.4–10.5)
Chloride: 105 mEq/L (ref 96–112)
Creatinine, Ser: 0.95 mg/dL (ref 0.50–1.35)
GFR calc Af Amer: >90 mL/min (ref >90)
GFR calc non Af Amer: >90 mL/min (ref >90)
Glucose, Bld: 124 mg/dL — ABNORMAL HIGH (ref 70–99)
Potassium: 3.4 mEq/L — ABNORMAL LOW (ref 3.5–5.1)
Sodium: 138 mEq/L (ref 135–145)

## 2012-06-22 LAB — POCT ACTIVATED CLOTTING TIME
Activated Clotting Time: 176 seconds
Activated Clotting Time: 165 seconds

## 2012-06-22 LAB — URINE CULTURE
Colony Count: NO GROWTH
Culture: NO GROWTH

## 2012-06-22 LAB — HEPARIN LEVEL (UNFRACTIONATED): Heparin Unfractionated: 0.27 IU/mL — ABNORMAL LOW (ref 0.30–0.70)

## 2012-06-22 LAB — PROCALCITONIN: Procalcitonin: 1.3 ng/mL

## 2012-06-22 MED ORDER — ATROPINE SULFATE 1 MG/ML IJ SOLN
INTRAMUSCULAR | Status: AC
  Filled 2012-06-22: qty 1

## 2012-06-22 MED ORDER — ADULT MULTIVITAMIN LIQUID CH
5.0000 mL | Freq: Every day | ORAL | Status: DC
  Administered 2012-06-22 – 2012-06-23 (×2): 5 mL
  Filled 2012-06-22 (×3): qty 5

## 2012-06-22 MED ORDER — POTASSIUM CHLORIDE CRYS ER 20 MEQ PO TBCR: 20.0000 meq | EXTENDED_RELEASE_TABLET | Freq: Once | ORAL | Status: DC

## 2012-06-22 MED ORDER — POTASSIUM CHLORIDE 20 MEQ/15ML (10%) PO LIQD
20.0000 meq | Freq: Once | ORAL | Status: AC
  Administered 2012-06-22: 20 meq via ORAL
  Filled 2012-06-22: qty 15

## 2012-06-22 MED ORDER — POTASSIUM CHLORIDE 20 MEQ/15ML (10%) PO LIQD
40.0000 meq | Freq: Three times a day (TID) | ORAL | Status: AC
  Administered 2012-06-22 (×2): 40 meq
  Filled 2012-06-22 (×2): qty 30

## 2012-06-22 MED ORDER — PRO-STAT SUGAR FREE PO LIQD
60.0000 mL | Freq: Three times a day (TID) | ORAL | Status: DC
  Administered 2012-06-22 – 2012-06-23 (×3): 60 mL
  Filled 2012-06-22 (×5): qty 60

## 2012-06-22 MED FILL — Dextrose Inj 5%: INTRAVENOUS | Qty: 50 | Status: AC

## 2012-06-22 NOTE — Progress Notes (Signed)
NUTRITION FOLLOW UP  Intervention:   Continuous Jevity 1.2 @ 40 ml/hr, and add ProStat 60 ml TID via OG tube to meet protein goal.  Nutrition Support will provide: 1752 kcal, 143 gr protein and 775 ml water per day which will meet 100% estimated energy and protein needs.  Nutrition Dx: Inadequate oral intake r/t inability to eat AEB NPO status  Goal:   Enteral nutrition to provide 60-70% of estimated calorie needs (22-25 kcals/kg ideal body weight) and >/= 90% of estimated protein needs, based on ASPEN guidelines for permissive underfeeding in critically ill obese individuals.   Monitor:   Monitor tube feeding advancement and tolerance, labs and wt trends  Assessment:   Patient is currently intubated on ventilator support.  MV: 9.6 Temp:Temp (24hrs), Avg:99.7 F (37.6 C), Min:99 F (37.2 C), Max:100.4 F (38 C)   Height: Ht Readings from Last 1 Encounters:  06/19/12 5\' 9"  (1.753 m)    Weight Status:   Wt Readings from Last 1 Encounters:  06/22/12 206 lb 12.7 oz (93.8 kg)    Re-estimated needs:  Kcal: 4782-9562 kcal/day Protein: 145 gr Fluid: 1.6-1.8 L/day  Skin: no issues noted  Diet Order:     Intake/Output Summary (Last 24 hours) at 06/22/12 1211 Last data filed at 06/22/12 1200  Gross per 24 hour  Intake 2530.37 ml  Output   1694 ml  Net 836.37 ml    Last BM: PTA   Labs:   Lab 06/22/12 0450 06/21/12 0920 06/21/12 0147  NA 138 139 139  K 3.4* 4.0 5.1  CL 105 106 107  CO2 23 21 20   BUN 17 19 18   CREATININE 0.95 1.14 0.90  CALCIUM 8.5 8.3* 8.3*  MG -- -- --  PHOS -- -- --  GLUCOSE 124* 123* 91    CBG (last 3)   Basename 06/22/12 0739 06/22/12 0418 06/21/12 2349  GLUCAP 111* 114* 118*    Scheduled Meds:   . albuterol-ipratropium  6 puff Inhalation Q4H  . antiseptic oral rinse  15 mL Mouth Rinse QID  . aspirin EC  325 mg Oral Daily  . chlorhexidine  15 mL Mouth/Throat BID  . clopidogrel  75 mg Oral Q breakfast  . famotidine (PEPCID) IV   20 mg Intravenous Q12H  . feeding supplement (JEVITY 1.2 CAL)  1,000 mL Per Tube Daily  . insulin aspart  2-6 Units Subcutaneous Q4H  . piperacillin-tazobactam (ZOSYN)  IV  3.375 g Intravenous Q8H  . vancomycin  1,250 mg Intravenous Q12H    Continuous Infusions:   . sodium chloride 20 mL/hr at 06/20/12 2000  . DOPamine Stopped (06/21/12 0000)  . heparin 1,700 Units/hr (06/22/12 0922)  . norepinephrine (LEVOPHED) Adult infusion Stopped (06/22/12 1200)    [phone]

## 2012-06-22 NOTE — Progress Notes (Addendum)
SUBJECTIVE:  Waking up some.  Remains intubated on the IABP  OBJECTIVE:   Vitals:   Filed Vitals:   06/22/12 0600 06/22/12 0615 06/22/12 0700 06/22/12 0800  BP:    103/55  Pulse: 96 78 87 93  Temp: 100.4 F (38 C) 100.2 F (37.9 C) 100 F (37.8 C) 99.9 F (37.7 C)  TempSrc:      Resp: 13 14 13 14   Height:      Weight: 93.8 kg (206 lb 12.7 oz)     SpO2: 100% 100% 100% 100%   I&O's:   Intake/Output Summary (Last 24 hours) at 06/22/12 6213 Last data filed at 06/22/12 0800  Gross per 24 hour  Intake 2471.77 ml  Output   1714 ml  Net 757.77 ml   TELEMETRY: Reviewed telemetry pt in NSR:     PHYSICAL EXAM General: Intubated and sedated Lungs:   Rhonchi anteriorly Heart:   HRRR S1 S2 Pulses are 2+ & equal.bruits. Abdomen: Bowel sounds are positive, abdomen soft and non-tender without masses  Extremities:   No clubbing, cyanosis or edema.  DP +1 Neuro: Alert and oriented X 3. Psych:  Good affect, responds appropriately   LABS: Basic Metabolic Panel:  Basename 06/22/12 0450 06/21/12 0920  NA 138 139  K 3.4* 4.0  CL 105 106  CO2 23 21  GLUCOSE 124* 123*  BUN 17 19  CREATININE 0.95 1.14  CALCIUM 8.5 8.3*  MG -- --  PHOS -- --   CBC:  Basename 06/22/12 0450 06/21/12 0211  WBC 14.8* 19.4*  NEUTROABS -- --  HGB 11.5* 15.0  HCT 33.5* 43.4  MCV 93.1 93.7  PLT 123* 160   Coag Panel:   Lab Results  Component Value Date   INR 1.06 06/19/2012   INR 1.79* 06/19/2012   INR 0.99 06/19/2012    RADIOLOGY: Dg Chest Port 1 View  06/22/2012  *RADIOLOGY REPORT*  Clinical Data: Acute MI.  Respiratory failure.  Ventilator.  PORTABLE CHEST - 1 VIEW  Comparison: 06/21/2012  Findings: Endotracheal tube in good position.  Left jugular central venous catheter tip in the SVC.  Aortic balloon pump below the aortic arch, unchanged.  NG in the stomach.  Mild airspace opacity has developed bilaterally and may represent mild edema.  No significant effusion.  Lung volume is normal.   IMPRESSION: Support lines in good position unchanged.  Development of mild bilateral airspace disease, most consistent with mild edema.   Original Report Authenticated By: Janeece Riggers, M.D.    Dg Chest Port 1 View  06/21/2012  *RADIOLOGY REPORT*  Clinical Data: Endotracheal tube positioning  PORTABLE CHEST - 1 VIEW  Comparison: 06/20/2012; 06/19/2012  Findings:  Grossly unchanged cardiac silhouette and mediastinal contours. Stable positioning of support apparatus including tip of intra- aortic balloon pump approximate 1.5 cm from the superior aspect of the aortic arch.  External pacing pads overlie the left heart border.  No pneumothorax.  Pulmonary vasculature remains indistinct.  Grossly unchanged perihilar and medial basilar heterogeneous opacities do not represent atelectasis.  No definite pleural effusion.  Unchanged bones.  IMPRESSION: 1.  Stable positioning of support apparatus.  No pneumothorax. 2.  Grossly unchanged findings of pulmonary edema and bibasilar atelectasis.   Original Report Authenticated By: Tacey Ruiz, MD    Dg Chest Port 1 View  06/20/2012  *RADIOLOGY REPORT*  Clinical Data: New endotracheal tube placement  PORTABLE CHEST - 1 VIEW  Comparison: Portable chest x-ray of 06/19/2012  Findings: The tip of  the endotracheal tube is approximately 3.3 cm above the carina.  Aeration has improved.  However, there is new opacity medially at the left lung base suspicious for atelectasis and/or pneumonia and there may be a small left effusion present. Cardiomegaly is stable.  An NG tube is noted into the stomach.  IMPRESSION:  1.  Tip of endotracheal tube approximately 3.3 cm above the carina. 2.  Increased opacity medially at the left lung base suspicious for atelectasis, pneumonia, and possibly left effusion.   Original Report Authenticated By: Dwyane Dee, M.D.    Dg Chest Portable 1 View  06/19/2012  *RADIOLOGY REPORT*  Clinical Data: Post CPR, evaluate support devices  PORTABLE CHEST - 1  VIEW  Comparison: None.  Findings: Endotracheal tube tip projects 4.5 cm proximal to the carina.  Left IJ catheter tip projects over the left brachiocephalic vein in the SVC confluence.  Heart size upper normal to mildly enlarged.  Interstitial prompts / peribronchial thickening.  NG tube descends into the abdomen, tip just below the GE junction.  Side port is just above the GE junction.  No pleural effusion or pneumothorax.  No acute osseous finding. Nondisplaced rib fractures related to resuscitation not excluded.  IMPRESSION: Endotracheal tube tip 4.5 cm proximal to the carina.  NG tube side port is above the GE junction, therefore advancement recommend.  Left IJ catheter tip projects over the left brachiocephalic vein near the SVC confluence.  No pneumothorax.  Interstitial prominence and peribronchial thickening may reflect acute edema or bronchitis versus chronic bronchitic change.   Original Report Authenticated By: Jearld Lesch, M.D.    ASSESSMENT:  1. Anterior STEMI s/p cath with successful PCI of proximal occluded LAD  2. Cardiogenic shock still requiring IABP and pressor support but pressors have been weaned to Norepi 3. Cardiac arrest secondary to #1 - off hypothermia protocol  4. Tobacco abuse  5.  VDFR with increased secretions from presumed bronchitis 6.  Hypokalemia 7.  Fever and elevated WBC secondary to bronchitis 8.  Ischemic dilated CM - EF 20% on echo with large area of apical and anteroseptal akinesis  PLAN:  1. IABP placed at 2:1 and will try to wean and remove today 2. Wean pressors as BP tolerates after IABP removed 3. Continue IV Heparin gtt/ASA/Plavix  4. Beta blocker stopped due to low BP 5. Replete potassium 6. Consider Neuro eval - per PCCM 7. Antibiotics per PCCM     Quintella Reichert, MD  06/22/2012  8:22 AM

## 2012-06-22 NOTE — Progress Notes (Signed)
ACT for pt was 175 and 167 one hour after heparin drip d/c'd for an hour. Dr Eldridge Dace ordered IABP to be d'c/d at this time. Cath lab called.

## 2012-06-22 NOTE — Progress Notes (Signed)
EEG completed.

## 2012-06-22 NOTE — Progress Notes (Signed)
ANTIBIOTIC - Initial & COAGULATION - Follow Up CONSULT NOTE  Pharmacy Consult for Heparin  Indication: Anticoagulation with IABP in place  No Known Allergies  Patient Measurements: Height: 5\' 9"  (175.3 cm) Weight: 206 lb 12.7 oz (93.8 kg) IBW/kg (Calculated) : 70.7  Heparin Dosing Weight: 88.6 kg  Vital Signs: Temp: 99.5 F (37.5 C) (12/23 1200) BP: 129/79 mmHg (12/23 1200) Pulse Rate: 72  (12/23 1200)  Labs:  Basename 06/22/12 0450 06/21/12 1540 06/21/12 0920 06/21/12 0211 06/21/12 0147 06/20/12 1815 06/20/12 0506  HGB 11.5* -- -- 15.0 -- -- --  HCT 33.5* -- -- 43.4 -- 46.0 --  PLT 123* -- -- 160 -- -- 222  APTT -- -- -- -- -- -- --  LABPROT -- -- -- -- -- -- --  INR -- -- -- -- -- -- --  HEPARINUNFRC 0.27* 0.49 0.27* -- -- -- --  CREATININE 0.95 -- 1.14 -- 0.90 -- --  CKTOTAL -- -- -- -- -- -- --  CKMB -- -- -- -- -- -- --  TROPONINI -- -- -- -- -- -- --    Estimated Creatinine Clearance: 99.3 ml/min (by C-G formula based on Cr of 0.95).   Medications:  Infusions:     . sodium chloride 20 mL/hr at 06/20/12 2000  . DOPamine Stopped (06/21/12 0000)  . heparin 1,700 Units/hr (06/22/12 0922)  . norepinephrine (LEVOPHED) Adult infusion Stopped (06/22/12 1200)  . [DISCONTINUED] heparin 1,800 Units/hr (06/21/12 1610)     Assessment: 56 y.o. Walker who presented to the [facility] on 12/20, had witness VFib arrest, STEMI, emergent cath with BMS placed in the LAD along with IABP and stabilized on the hypothermia protocol. Pharmacy consulted to dose heparin  Events: IABP being weaned, pressors turned off.  Anticoag: IABP, ACS: heparin @ 1700, level at goal, H/H trend down, no bleeding noted.  Platelet trend also down 231 > 123, follow closely  ID:  empiric asp PNA/bronchitis coverage, pt developed fevers post tx hypotherm. Tmax/24h: 37.8 WBC trend down 14.8,  PCT 2.69   Zosyn 12/21 >> Vanc 12/21 >> Unasyn 12/21 >> 12/22  12/22 RCx >> NGTD 12/22 BCx >> NGTD 12/22 UCx  >>NGf MRSA PCR negative Influenza negative  CV: s/p cardiac arrest STEMI IABP. S/p BMS to LAD: ASA, plavix : HR 100-40s, SBP 90-120s  Endo: No hx. CBgs (at goal of <180). On ICU SSI (4 units/24h)  Gl / Nutrition: TF, Pepcid  Neuro: Hypothermia protocol. Now rewarmed, nimbex off. Changed to intermit fent/versed. GCS 10  Nephro: SCr 0.95, CrCl~100 ml/min. K 3.4  Pulmo: VDRF. hx bronchitis PTA. Also with hx tobacco abuse. 98% on fiO2 at 40%  PTA Med Issues: Still pending  Best Practices: MC, H2A (SUP, vent)  Goal of Therapy:  Heparin Level 0.2-0.5 units/ml (with IABP in place) Monitor platelets by anticoagulation protocol: Yes   Plan:   -[place] heparin at 1700 units/hr -Daily Heparin level & CBC -Follow up plan for IABP wean and removal -Follow up SCr, UOP, cultures, clinical course and adjust antibiotics as clinically indicated.    Thank you for allowing pharmacy to be a part of this patients care team.  Lovenia Kim Pharm.D., BCPS Clinical Pharmacist 06/22/2012 12:16 PM Pager: [phone] Phone: [phone]

## 2012-06-22 NOTE — CV Procedure (Signed)
D/c'd Balloon Pump and left femoral balloon pump sheath per MD orders and unit protocols. Held manual pressure for 25 min. Pt. Intubated with involuntary movements but no signs of distress or pain. Applied dry sterile gauze dressing and no signs of bleeding, hematoma, or discoloration. Pedal pulses palpable in left pedal prior, during, and post sheath pull. Vital signs during sheath pull: b/p - 85-110s/60s; HR 90s-100s; pt. Intubated with 02 sat of 100%. 2900 RN at bedside and aware of groin site post sheath pull, Will continue care.

## 2012-06-22 NOTE — Progress Notes (Signed)
PULMONARY  / CRITICAL CARE MEDICINE  Name: [name] Walker MRN: [id number] DOB: [birth year]    LOS: 3  REFERRING MD :  [facility]  CHIEF COMPLAINT:  Cardiac arrest  BRIEF PATIENT DESCRIPTION: 56 y/o male smoker with no known past medical history presented on 06/19/2012 to the [facility] ED in cardiac arrest in the setting of AW- STEMI.  Underwent 30 minutes of CPR and s/p  successful PCI of proximal occluded LAD.  Cardiogenic shock requiring pressors and IABP  LINES / TUBES: 12/20 ETT >> 12/20 L IJ CVL >>  CULTURES: 12/20 rapid flu >>neg  ANTIBIOTICS: Zosyn 12/21 (hcap) vanco 12/21 (hcap)  SIGNIFICANT EVENTS:  Remains on vent.  Not weanable. On vasopressors.  VITAL SIGNS: Temp:  [99 F (37.2 C)-100.4 F (38 C)] 99 F (37.2 C) (12/23 1100) Pulse Rate:  [40-106] 70  (12/23 1100) Resp:  [6-20] 12  (12/23 1100) BP: (98-133)/(55-73) 133/72 mmHg (12/23 0900) SpO2:  [95 %-100 %] 100 % (12/23 1100) Arterial Line BP: (91-129)/(53-82) 98/82 mmHg (12/23 1100) FiO2 (%):  [40 %] 40 % (12/23 1150) Weight:  [93.8 kg (206 lb 12.7 oz)] 93.8 kg (206 lb 12.7 oz) (12/23 0600) HEMODYNAMICS: CVP:  [9 mmHg-11 mmHg] 10 mmHg VENTILATOR SETTINGS: Vent Mode:  [-] PRVC FiO2 (%):  [40 %] 40 % Set Rate:  [14 bmp] 14 bmp Vt Set:  [55 mL-550 mL] 550 mL PEEP:  [5 cmH20] 5 cmH20 Plateau Pressure:  [11 cmH20-14 cmH20] 14 cmH20 Trapping air on vent.  Itime reduced from 0.9 to 0.75   RR reduced to 14 from 22 and Vee then reduces from 8 to 0-1 INTAKE / OUTPUT: Intake/Output      12/22 0701 - 12/23 0700 12/23 0701 - 12/24 0700   I.V. (mL/kg) 1823.4 (19.4) 246.5 (2.6)   NG/GT 590 180   IV Piggyback 12.5 50   Total Intake(mL/kg) 2425.9 (25.9) 476.5 (5.1)   Urine (mL/kg/hr) 1489 (0.7) 275 (0.6)   Emesis/NG output 250    Total Output 1739 275   Net +686.9 +201.5         PHYSICAL EXAMINATION: Gen:off NMBs, appears older than age,  on vent HEENT: NCAT, ETT in place PULM: BS bilaterally equal CV: Tachy,  regular, no mgr AB: BS infrequent, soft, nontender Ext: cool, no edema Neuro: sedated , paralysed,  LABS: Cbc  Lab 06/22/12 0450 06/21/12 0211 06/20/12 1815 06/20/12 0506  WBC 14.8* -- -- --  HGB 11.5* 15.0 15.6 --  HCT 33.5* 43.4 46.0 --  PLT 123* 160 -- 222   Chemistry  Lab 06/22/12 0450 06/21/12 0920 06/21/12 0147  NA 138 139 139  K 3.4* 4.0 5.1  CL 105 106 107  CO2 23 21 20   BUN 17 19 18   CREATININE 0.95 1.14 0.90  CALCIUM 8.5 8.3* 8.3*  MG -- -- --  PHOS -- -- --  GLUCOSE 124* 123* 91   Liver fxn  Lab 06/19/12 0338  AST 94*  ALT 81*  ALKPHOS 82  BILITOT 0.3  PROT 6.8  ALBUMIN 3.9   coags  Lab 06/19/12 1200 06/19/12 0645 06/19/12 0338  APTT 58* 106* 34  INR 1.06 1.79* 0.99   Sepsis markers  Lab 06/22/12 0450 06/21/12 0342  LATICACIDVEN -- --  PROCALCITON 1.30 2.69   Cardiac markers  Lab 06/19/12 0339  CKTOTAL 416*  CKMB 32.3*  TROPONINI 3.15*   BNP No results found for this basename: PROBNP:3 in the last 168 hours ABG  Lab 06/22/12 0430 06/21/12  0405 06/20/12 1815 06/20/12 1718  PHART 7.423 7.328* -- 7.286*  PCO2ART 38.5 42.8 -- 43.9  PO2ART 97.5 110.0* -- 52.0*  HCO3 24.4* 21.2 -- 21.5  TCO2 25.5 22.4 22 --   CBG trend  Lab 06/22/12 0739 06/22/12 0418 06/21/12 2349 06/21/12 2034 06/21/12 1606  GLUCAP 111* 114* 118* 120* 127*   IMAGING: 12/20 ETT 4.5 cm above carina, CVL in place, possible bronchitis  ECG: Sinus tach, ST elevation in Lateral precordial leads  DIAGNOSES: Principal Problem:  *STEMI (ST elevation myocardial infarction) Active Problems:  Cardiac arrest  Acute respiratory failure  Ventricular fibrillation  Acute encephalopathy  Leukocytosis  Tobacco abuse  Cardiogenic shock  ASSESSMENT / PLAN:  PULMONARY  ASSESSMENT: 1) Acute respiratory failure due to cardiac arrest 2) Likely bronchitis, secretions from tube, apparently purchased OTC cold drugs just prior to cardiac arrest;  3)airtrapping on vent with  subsequent autopeep contributing to shock state  PLAN:   -Full vent support. -Continue current vent support. -ABG/CXR daily. -WUA daily, hold SBT until off IABP and hemodynamics are more stable.  CARDIOVASCULAR  ASSESSMENT:  1) STEMI; major known risk factor for CAD is tobacco abuse 2) Cardiac arrest 3) cardiogenic shock on IABP & pressors Now off hypothermia PLAN:  -Per cardiology -Echo from 12/20 with EF of 20% and hypokineses.  RENAL  ASSESSMENT:  Oliguria improved 1) Hypokalemia resolved  PLAN:   - BMET daily. - Replace K as needed.  GASTROINTESTINAL  ASSESSMENT:   1) No acute issues PLAN:   - Stress ulcer prophylaxis. - OG tube. - Continue TF.  HEMATOLOGIC  ASSESSMENT:   1) No acute issues PLAN:  - Monitor for bleeding.  INFECTIOUS  ASSESSMENT:   1) Acute bronchitis 2) fever  PLAN:   - Expanded to zosyn/vanco and unasyn d/c d/t fever after hypothermia resolved. - Give IV tylenol.  ENDOCRINE  ASSESSMENT:   1) no acute issues  PLAN:   - Monitor glucose  NEUROLOGIC  ASSESSMENT:   1) Acute encephalopathy/comatose from cardiac arrest, high risk for anoxic brain injury -had down time.  Still unresponsive after rewarming. Anoxic encephalopathy PLAN:   - Monitor neuro. - PRN sedation. - Portable head CT once off IABP, unsafe to move now but neuro status will need to be addressed for prognostication, will order EEG.  CLINICAL SUMMARY:   I have personally obtained a history, examined the patient, evaluated laboratory and imaging results, formulated the assessment and plan and placed orders.  CRITICAL CARE: The patient is critically ill with multiple organ systems failure and requires high complexity decision making for assessment and support, frequent evaluation and titration of therapies, application of advanced monitoring technologies and extensive interpretation of multiple databases. Critical Care Time devoted to patient care services  described in this note is 35 minutes.   Alyson Reedy, M.D. [facility] Pulmonary/Critical Care Medicine. Pager: [phone]. After hours pager: [phone].

## 2012-06-22 NOTE — Progress Notes (Signed)
Restrains ordered; the patient moving in bed and interfering with medical treatment.

## 2012-06-23 ENCOUNTER — Inpatient Hospital Stay (HOSPITAL_COMMUNITY): Payer: BC Managed Care – PPO

## 2012-06-23 DIAGNOSIS — I255 Ischemic cardiomyopathy: Secondary | ICD-10-CM | POA: Diagnosis not present

## 2012-06-23 LAB — BLOOD GAS, ARTERIAL
Acid-Base Excess: 1.5 mmol/L (ref 0.0–2.0)
Bicarbonate: 24.8 mEq/L — ABNORMAL HIGH (ref 20.0–24.0)
Drawn by: 36527
FIO2: 0.3 %
MECHVT: 550 mL
O2 Saturation: 94.7 %
PEEP: 5 cmH2O
Patient temperature: 98.6
RATE: 14 resp/min
TCO2: 25.9 mmol/L (ref 0–100)
pCO2 arterial: 34.3 mmHg — ABNORMAL LOW (ref 35.0–45.0)
pH, Arterial: 7.473 — ABNORMAL HIGH (ref 7.350–7.450)
pO2, Arterial: 67.7 mmHg — ABNORMAL LOW (ref 80.0–100.0)

## 2012-06-23 LAB — CBC
HCT: 30 % — ABNORMAL LOW (ref 39.0–52.0)
Hemoglobin: 10.4 g/dL — ABNORMAL LOW (ref 13.0–17.0)
MCH: 32.4 pg (ref 26.0–34.0)
MCHC: 34.7 g/dL (ref 30.0–36.0)
MCV: 93.5 fL (ref 78.0–100.0)
Platelets: 131 10*3/uL — ABNORMAL LOW (ref 150–400)
RBC: 3.21 MIL/uL — ABNORMAL LOW (ref 4.22–5.81)
RDW: 14.2 % (ref 11.5–15.5)
WBC: 11 10*3/uL — ABNORMAL HIGH (ref 4.0–10.5)

## 2012-06-23 LAB — BASIC METABOLIC PANEL
BUN: 19 mg/dL (ref 6–23)
CO2: 25 mEq/L (ref 19–32)
Calcium: 8.7 mg/dL (ref 8.4–10.5)
Chloride: 107 mEq/L (ref 96–112)
Creatinine, Ser: 0.83 mg/dL (ref 0.50–1.35)
GFR calc Af Amer: >90 mL/min (ref >90)
GFR calc non Af Amer: >90 mL/min (ref >90)
Glucose, Bld: 97 mg/dL (ref 70–99)
Potassium: 3.8 mEq/L (ref 3.5–5.1)
Sodium: 141 mEq/L (ref 135–145)

## 2012-06-23 LAB — GLUCOSE, CAPILLARY
Glucose-Capillary: 111 mg/dL — ABNORMAL HIGH (ref 70–99)
Glucose-Capillary: 112 mg/dL — ABNORMAL HIGH (ref 70–99)
Glucose-Capillary: 114 mg/dL — ABNORMAL HIGH (ref 70–99)
Glucose-Capillary: 117 mg/dL — ABNORMAL HIGH (ref 70–99)
Glucose-Capillary: 98 mg/dL (ref 70–99)

## 2012-06-23 LAB — PHOSPHORUS: Phosphorus: 1.8 mg/dL — ABNORMAL LOW (ref 2.3–4.6)

## 2012-06-23 LAB — CULTURE, RESPIRATORY: Culture: NO GROWTH

## 2012-06-23 LAB — PROCALCITONIN: Procalcitonin: 0.7 ng/mL

## 2012-06-23 LAB — CULTURE, RESPIRATORY W GRAM STAIN

## 2012-06-23 LAB — MAGNESIUM: Magnesium: 1.7 mg/dL (ref 1.5–2.5)

## 2012-06-23 LAB — VANCOMYCIN, TROUGH: Vancomycin Tr: 10.2 ug/mL (ref 10.0–20.0)

## 2012-06-23 MED ORDER — ALBUTEROL SULFATE (5 MG/ML) 0.5% IN NEBU
INHALATION_SOLUTION | RESPIRATORY_TRACT | Status: AC
  Administered 2012-06-23: 2.5 mg
  Filled 2012-06-23: qty 0.5

## 2012-06-23 MED ORDER — IPRATROPIUM BROMIDE 0.02 % IN SOLN
RESPIRATORY_TRACT | Status: AC
  Administered 2012-06-23: 0.5 mg
  Filled 2012-06-23: qty 2.5

## 2012-06-23 MED ORDER — ALBUTEROL SULFATE (5 MG/ML) 0.5% IN NEBU
2.5000 mg | INHALATION_SOLUTION | RESPIRATORY_TRACT | Status: DC
  Administered 2012-06-23 – 2012-06-24 (×6): 2.5 mg via RESPIRATORY_TRACT
  Filled 2012-06-23 (×4): qty 0.5

## 2012-06-23 MED ORDER — SODIUM CHLORIDE 0.9 % IV SOLN: INTRAVENOUS | Status: DC

## 2012-06-23 MED ORDER — IPRATROPIUM BROMIDE 0.02 % IN SOLN
0.5000 mg | RESPIRATORY_TRACT | Status: DC
  Administered 2012-06-23 – 2012-06-24 (×6): 0.5 mg via RESPIRATORY_TRACT
  Filled 2012-06-23 (×4): qty 2.5

## 2012-06-23 MED ORDER — POTASSIUM PHOSPHATE DIBASIC 3 MMOLE/ML IV SOLN
30.0000 mmol | Freq: Once | INTRAVENOUS | Status: AC
  Administered 2012-06-23: 30 mmol via INTRAVENOUS
  Filled 2012-06-23: qty 10

## 2012-06-23 MED ORDER — RACEPINEPHRINE HCL 2.25 % IN NEBU: 0.5000 mL | INHALATION_SOLUTION | Freq: Once | RESPIRATORY_TRACT | Status: DC

## 2012-06-23 MED ORDER — ASPIRIN 325 MG PO TABS
325.0000 mg | ORAL_TABLET | Freq: Every day | ORAL | Status: DC
  Administered 2012-06-23: 325 mg via ORAL
  Filled 2012-06-23: qty 1

## 2012-06-23 MED ORDER — PANTOPRAZOLE SODIUM 40 MG IV SOLR
40.0000 mg | INTRAVENOUS | Status: DC
  Administered 2012-06-23: 40 mg via INTRAVENOUS
  Filled 2012-06-23 (×2): qty 40

## 2012-06-23 MED ORDER — CHLORHEXIDINE GLUCONATE 0.12 % MT SOLN
15.0000 mL | Freq: Two times a day (BID) | OROMUCOSAL | Status: DC
  Administered 2012-06-23 – 2012-06-24 (×4): 15 mL via OROMUCOSAL
  Filled 2012-06-23 (×4): qty 15

## 2012-06-23 MED ORDER — FUROSEMIDE 10 MG/ML IJ SOLN
40.0000 mg | Freq: Three times a day (TID) | INTRAMUSCULAR | Status: AC
  Administered 2012-06-23 (×2): 40 mg via INTRAVENOUS
  Filled 2012-06-23 (×2): qty 4

## 2012-06-23 MED ORDER — MAGNESIUM SULFATE 40 MG/ML IJ SOLN
2.0000 g | Freq: Once | INTRAMUSCULAR | Status: AC
  Administered 2012-06-23: 2 g via INTRAVENOUS
  Filled 2012-06-23: qty 50

## 2012-06-23 MED ORDER — VANCOMYCIN HCL IN DEXTROSE 1-5 GM/200ML-% IV SOLN
1000.0000 mg | Freq: Three times a day (TID) | INTRAVENOUS | Status: DC
  Administered 2012-06-23 – 2012-06-24 (×3): 1000 mg via INTRAVENOUS
  Filled 2012-06-23 (×4): qty 200

## 2012-06-23 MED ORDER — ASPIRIN 300 MG RE SUPP
300.0000 mg | Freq: Every day | RECTAL | Status: DC
  Filled 2012-06-23: qty 1

## 2012-06-23 NOTE — Procedures (Signed)
Extubation Procedure Note  Patient Details:   Name: [name] Walker DOB: 03-26-1956 MRN: [id number]   Airway Documentation:  Airway 8 mm (Active)  Secured at (cm) 24 cm 06/23/2012  8:26 AM  Measured From Lips 06/23/2012  8:26 AM  Secured Location Center 06/23/2012  8:26 AM  Secured By [employer] 06/23/2012  8:26 AM  Tube Holder Repositioned Yes 06/23/2012  8:26 AM  Cuff Pressure (cm H2O) 26 cm H2O 06/23/2012  3:40 AM  Site Condition Dry 06/23/2012  8:26 AM    Evaluation  O2 sats: stable throughout Complications: No apparent complications Patient did tolerate procedure well. Bilateral Breath Sounds: Rhonchi Suctioning: Airway Yes  [name] Walker 06/23/2012, 12:12 PM

## 2012-06-23 NOTE — Progress Notes (Signed)
PULMONARY  / CRITICAL CARE MEDICINE  Name: [name] Walker MRN: [id number] DOB: 04-07-1956    LOS: 4  REFERRING MD :  [facility]  CHIEF COMPLAINT:  Cardiac arrest  BRIEF PATIENT DESCRIPTION: 56 y/o male smoker with no known past medical history presented on 06/19/2012 to the [facility] ED in cardiac arrest in the setting of AW- STEMI.  Underwent 30 minutes of CPR and s/p  successful PCI of proximal occluded LAD.  Cardiogenic shock requiring pressors and IABP  LINES / TUBES: 12/20 ETT >> 12/20 L IJ CVL >>  CULTURES: 12/20 rapid flu >>neg Blood 12/22>>>NTD Urine 12/22>>>NTD Sputum 12/22>>>NTD  ANTIBIOTICS: Zosyn 12/21 (hcap)>>> vanco 12/21 (hcap)>>>  SIGNIFICANT EVENTS:  Remains on vent.  Not weanable. On vasopressors.  VITAL SIGNS: Temp:  [98.2 F (36.8 C)-100 F (37.8 C)] 99.7 F (37.6 C) (12/24 1200) Pulse Rate:  [80-118] 94  (12/24 1200) Resp:  [11-28] 19  (12/24 1200) BP: (85-122)/(57-84) 112/76 mmHg (12/24 1200) SpO2:  [93 %-100 %] 100 % (12/24 1214) Arterial Line BP: (100-125)/(68-74) 125/74 mmHg (12/23 1500) FiO2 (%):  [30 %-40 %] 40 % (12/24 0826) Weight:  [93.8 kg (206 lb 12.7 oz)] 93.8 kg (206 lb 12.7 oz) (12/24 0300) HEMODYNAMICS: CVP:  [8 mmHg-12 mmHg] 8 mmHg VENTILATOR SETTINGS: Vent Mode:  [-] PRVC FiO2 (%):  [30 %-40 %] 40 % Set Rate:  [14 bmp] 14 bmp Vt Set:  [550 mL] 550 mL PEEP:  [5 cmH20] 5 cmH20 Plateau Pressure:  [15 cmH20-17 cmH20] 15 cmH20 Trapping air on vent.  Itime reduced from 0.9 to 0.75   RR reduced to 14 from 22 and Vee then reduces from 8 to 0-1 INTAKE / OUTPUT: Intake/Output      12/23 0701 - 12/24 0700 12/24 0701 - 12/25 0700   I.V. (mL/kg) 920.5 (9.8) 120 (1.3)   NG/GT 1240 350   IV Piggyback 794.5 75   Total Intake(mL/kg) 2955 (31.5) 545 (5.8)   Urine (mL/kg/hr) 1580 (0.7) 500 (0.9)   Emesis/NG output     Total Output 1580 500   Net +1375 +45         PHYSICAL EXAMINATION: Gen:off NMBs, appears older than age, on vent but tracking  this AM. Neuro: Lethargic but awake and following commands. HEENT: NCAT, ETT in place. PULM: BS bilaterally equal but coarse.   CV: Tachy, regular, no mgr. AB: BS infrequent, soft, nontender. Ext: cool, no edema.  LABS: Cbc  Lab 06/23/12 0440 06/22/12 0450 06/21/12 0211  WBC 11.0* -- --  HGB 10.4* 11.5* 15.0  HCT 30.0* 33.5* 43.4  PLT 131* 123* 160   Chemistry  Lab 06/23/12 0440 06/22/12 0450 06/21/12 0920  NA 141 138 139  K 3.8 3.4* 4.0  CL 107 105 106  CO2 25 23 21   BUN 19 17 19   CREATININE 0.83 0.95 1.14  CALCIUM 8.7 8.5 8.3*  MG 1.7 -- --  PHOS 1.8* -- --  GLUCOSE 97 124* 123*   Liver fxn Lab 06/19/12 0338  AST 94*  ALT 81*  ALKPHOS 82  BILITOT 0.3  PROT 6.8  ALBUMIN 3.9   coags  Lab 06/19/12 1200 06/19/12 0645 06/19/12 0338  APTT 58* 106* 34  INR 1.06 1.79* 0.99   Sepsis markers  Lab 06/23/12 0440 06/22/12 0450 06/21/12 0342  LATICACIDVEN -- -- --  PROCALCITON 0.70 1.30 2.69   Cardiac markers  Lab 06/19/12 0339  CKTOTAL 416*  CKMB 32.3*  TROPONINI 3.15*   BNP No results found for  this basename: PROBNP:3 in the last 168 hours ABG  Lab 06/23/12 0445 06/22/12 0430 06/21/12 0405  PHART 7.473* 7.423 7.328*  PCO2ART 34.3* 38.5 42.8  PO2ART 67.7* 97.5 110.0*  HCO3 24.8* 24.4* 21.2  TCO2 25.9 25.5 22.4   CBG trend  Lab 06/23/12 1208 06/23/12 0821 06/23/12 0442 06/23/12 0039 06/22/12 2010  GLUCAP 112* 117* 98 111* 99   IMAGING: 12/20 ETT 4.5 cm above carina, CVL in place, possible bronchitis  ECG: Sinus tach, ST elevation in Lateral precordial leads  DIAGNOSES: Principal Problem:  *STEMI (ST elevation myocardial infarction) Active Problems:  Cardiac arrest  Acute respiratory failure  Ventricular fibrillation  Acute encephalopathy  Leukocytosis  Tobacco abuse  Cardiogenic shock  Ischemic dilated cardiomyopathy  ASSESSMENT / PLAN:  PULMONARY  ASSESSMENT: 1) Acute respiratory failure due to cardiac arrest 2) Likely bronchitis,  secretions from tube, apparently purchased OTC cold drugs just prior to cardiac arrest;  3) Airtrapping on vent with subsequent autopeep contributing to shock state  PLAN:   - Extubate today. - Titrate O2 for sat of 88-92%. - IS per RT protocol. - Racemic epi as needs be. - Lasix x2 doses.  CARDIOVASCULAR  ASSESSMENT:  1) STEMI; major known risk factor for CAD is tobacco abuse 2) Cardiac arrest 3) Crdiogenic shock on IABP & pressors Now off hypothermia PLAN:  - Per cardiology. - Echo from 12/20 with EF of 20% and hypokineses. - Maintain as dry as possible.  RENAL  ASSESSMENT:  Oliguria improved 1) Hypokalemia resolved  PLAN:   - BMET daily. - Replace K, Mg and Phos needed. - Two doses of lasix as above.  GASTROINTESTINAL  ASSESSMENT:   1) No acute issues PLAN:   - Stress ulcer prophylaxis change to IV. - D/C OGT, no necessary PO meds for now. - Hold TF. - Will need swallow evaluation in AM.  HEMATOLOGIC  ASSESSMENT:   1) No acute issues PLAN:  - Monitor for bleeding.  INFECTIOUS  ASSESSMENT:   1) Acute bronchitis 2) fever  PLAN:   - Maintain on vanc/zosyn with all negative cultures. - Give IV tylenol.  ENDOCRINE  ASSESSMENT:   1) no acute issues  PLAN:   - Monitor glucose  NEUROLOGIC  ASSESSMENT:   1) Acute encephalopathy/comatose from cardiac arrest, high risk for anoxic brain injury -had down time.  Still unresponsive after rewarming. Anoxic encephalopathy PLAN:   - Monitor neuro. - PRN sedation. - Head CT not ordered but mental status is improved, EEG is pending.  CLINICAL SUMMARY:   I have personally obtained a history, examined the patient, evaluated laboratory and imaging results, formulated the assessment and plan and placed orders.  CRITICAL CARE: The patient is critically ill with multiple organ systems failure and requires high complexity decision making for assessment and support, frequent evaluation and titration of  therapies, application of advanced monitoring technologies and extensive interpretation of multiple databases. Critical Care Time devoted to patient care services described in this note is 35 minutes.   Alyson Reedy, M.D. [facility] Pulmonary/Critical Care Medicine. Pager: [phone]. After hours pager: [phone].

## 2012-06-23 NOTE — Progress Notes (Addendum)
ANTIBIOTIC CONSULT NOTE - Follow-up  Pharmacy Consult for vancomycin and zosyn  Indication: empiric PNA/bronchitis   No Known Allergies  Patient Measurements: Height: 5\' 9"  (175.3 cm) Weight: 206 lb 12.7 oz (93.8 kg) IBW/kg (Calculated) : 70.7  Adjusted Body Weight:   Vital Signs: Temp: 99.5 F (37.5 C) (12/24 1400) Temp src: Core (Comment) (12/24 0900) BP: 111/71 mmHg (12/24 1400) Pulse Rate: 95  (12/24 1400) Intake/Output from previous day: 12/23 0701 - 12/24 0700 In: 2955 [I.V.:920.5; NG/GT:1240; IV Piggyback:794.5] Out: 1580 [Urine:1580] Intake/Output from this shift: Total I/O In: 635 [I.V.:160; NG/GT:350; IV Piggyback:125] Out: 850 [Urine:850]  Labs:  [facility] 06/23/12 0440 06/22/12 0450 06/21/12 0920 06/21/12 0211  WBC 11.0* 14.8* -- 19.4*  HGB 10.4* 11.5* -- 15.0  PLT 131* 123* -- 160  LABCREA -- -- -- --  CREATININE 0.83 0.95 1.14 --   Estimated Creatinine Clearance: 113.6 ml/min (by C-G formula based on Cr of 0.83).  Basename 06/23/12 1630  VANCOTROUGH 10.2  VANCOPEAK --  VANCORANDOM --  GENTTROUGH --  GENTPEAK --  GENTRANDOM --  TOBRATROUGH --  TOBRAPEAK --  TOBRARND --  AMIKACINPEAK --  AMIKACINTROU --  AMIKACIN --     Microbiology: Recent Results (from the past 720 hour(s))  MRSA PCR SCREENING     Status: Normal   Collection Time   06/19/12  6:50 AM      Component Value Range Status Comment   MRSA by PCR NEGATIVE  NEGATIVE Final   URINE CULTURE     Status: Normal   Collection Time   06/21/12  3:47 AM      Component Value Range Status Comment   Specimen Description URINE, CATHETERIZED   Final    Special Requests NONE   Final    Culture  Setup Time 06/21/2012 12:53   Final    Colony Count NO GROWTH   Final    Culture NO GROWTH   Final    Report Status 06/22/2012 FINAL   Final   CULTURE, RESPIRATORY     Status: Normal   Collection Time   06/21/12  6:44 AM      Component Value Range Status Comment   Specimen Description TRACHEAL  ASPIRATE   Final    Special Requests NONE   Final    Gram Stain     Final    Value: MODERATE WBC PRESENT, PREDOMINANTLY PMN     NO SQUAMOUS EPITHELIAL CELLS SEEN     NO ORGANISMS SEEN   Culture NO GROWTH 2 DAYS   Final    Report Status 06/23/2012 FINAL   Final   CULTURE, BLOOD (ROUTINE X 2)     Status: Normal (Preliminary result)   Collection Time   06/21/12  8:27 AM      Component Value Range Status Comment   Specimen Description BLOOD RIGHT HAND   Final    Special Requests BOTTLES DRAWN AEROBIC ONLY 8CC   Final    Culture  Setup Time 06/21/2012 18:51   Final    Culture     Final    Value:        BLOOD CULTURE RECEIVED NO GROWTH TO DATE CULTURE WILL BE HELD FOR 5 DAYS BEFORE ISSUING A FINAL NEGATIVE REPORT   Report Status PENDING   Incomplete   CULTURE, BLOOD (ROUTINE X 2)     Status: Normal (Preliminary result)   Collection Time   06/21/12  8:31 AM      Component Value Range Status Comment  Specimen Description BLOOD RIGHT HAND   Final    Special Requests BOTTLES DRAWN AEROBIC ONLY [facility]   Final    Culture  Setup Time 06/21/2012 18:51   Final    Culture     Final    Value:        BLOOD CULTURE RECEIVED NO GROWTH TO DATE CULTURE WILL BE HELD FOR 5 DAYS BEFORE ISSUING A FINAL NEGATIVE REPORT   Report Status PENDING   Incomplete     Medical History: Past Medical History  Diagnosis Date  . Varicose veins   . No pertinent past medical history     Medications:  Prescriptions prior to admission  Medication Sig Dispense Refill  . caffeine (NO DOZ MAXIMUM STRENGTH) 200 MG TABS Take 200 mg by mouth every 4 (four) hours as needed. To stay awake and alert.      Marland Kitchen dextromethorphan-guaiFENesin (MUCINEX DM) 30-600 MG per 12 hr tablet Take 1 tablet by mouth every 12 (twelve) hours. For congestion.      . sildenafil (VIAGRA) 100 MG tablet Take 100 mg by mouth daily as needed. For erectile dysfunction       Assessment: 56 y.o. M who presented to the [facility] on 12/20, had witness VFib arrest,  STEMI, emergent cath with BMS placed in the LAD along with IABP and stabilized on the hypothermia protocol.   Started on Vancomycin + Zosyn for empiric coverage of fevers s/p rewarming from hypothermia protocol.   Trough this afternoon on 1250 mg IV q12h was slightly less than desired goal.    Goal of Therapy:  Vancomycin trough level 15-20 mcg/ml  Plan:  - Continue Zosyn 3.375gm iv q8h  - Increase Vancomycin to 1000 mg IV q8h - Follow up SCr, UOP, cultures, clinical course and adjust as clinically indicated  Sussie Minor L. Illene Bolus, PharmD, BCPS Clinical Pharmacist Pager: [phone] Pharmacy: [phone] 06/23/2012 5:28 PM

## 2012-06-23 NOTE — Progress Notes (Addendum)
Speech Language Pathology  Patient Details Name: [name] Walker MRN: [id number] DOB: 09/24/1955 Today's Date: 06/23/2012 Time:  -    Received order for swallow assessment.  Dr. Percival Spanish note stated swallow eval 12/25, however ST is not here Christmas day, therefore SLP spoke with RN regarding current status.  Pt. extubated approximately one hour ago.  SLP checked in with pt. and family.  Pt. currently very lethargic and unable to keep eyes open.  Not ready for swallow assessment at present.  SLP will be back 12/26 for pt.'s status.      Breck Coons Anthony.Ed [employer] [phone]  06/23/2012

## 2012-06-23 NOTE — Progress Notes (Signed)
SUBJECTIVE:  Remains inutubated.  Does not follow commands but opens eyes  OBJECTIVE:   Vitals:   Filed Vitals:   06/23/12 0340 06/23/12 0400 06/23/12 0500 06/23/12 0600  BP: 104/80 109/70 99/71 113/71  Pulse: 109 109 106 105  Temp:  100 F (37.8 C) 100 F (37.8 C) 100 F (37.8 C)  TempSrc:      Resp: 24 17 12 17   Height:      Weight:      SpO2: 99% 98% 99% 100%   I&O's:   Intake/Output Summary (Last 24 hours) at 06/23/12 0829 Last data filed at 06/23/12 0600  Gross per 24 hour  Intake 2848.6 ml  Output   1505 ml  Net 1343.6 ml   TELEMETRY: Reviewed telemetry pt in NSR to sinus tachycardia     PHYSICAL EXAM General: Well developed, well nourished, in no acute distress Head: Eyes PERRLA, No xanthomas.   Normal cephalic and atramatic  Lungs:   Clear bilaterally to auscultation and percussion. Heart:   HRRR S1 S2 Pulses are 2+ & equal. Abdomen: Bowel sounds are positive, abdomen soft and non-tender without masses  Extremities:   No clubbing, cyanosis or edema.  DP +1 Neuro: Alert and oriented X 3. Psych:  Good affect, responds appropriately   LABS: Basic Metabolic Panel:  Basename 06/23/12 0440 06/22/12 0450  NA 141 138  K 3.8 3.4*  CL 107 105  CO2 25 23  GLUCOSE 97 124*  BUN 19 17  CREATININE 0.83 0.95  CALCIUM 8.7 8.5  MG 1.7 --  PHOS 1.8* --   CBC:  Basename 06/23/12 0440 06/22/12 0450  WBC 11.0* 14.8*  NEUTROABS -- --  HGB 10.4* 11.5*  HCT 30.0* 33.5*  MCV 93.5 93.1  PLT 131* 123*    Coag Panel:   Lab Results  Component Value Date   INR 1.06 06/19/2012   INR 1.79* 06/19/2012   INR 0.99 06/19/2012    RADIOLOGY: Dg Chest Port 1 View  06/23/2012  *RADIOLOGY REPORT*  Clinical Data: Endotracheal tube placement  PORTABLE CHEST - 1 VIEW  Comparison: 06/22/2012; 06/21/2012; 06/20/2012  Findings:  Grossly unchanged borderline enlarged cardiac silhouette. Unchanged mediastinal contours.  Interval removal of intra-aortic balloon pump.  Otherwise,  stable positioning of remaining support apparatus.  Minimal improved aeration of the lungs with persistent right peri and infrahilar opacities and left basilar opacities.  No new focal airspace opacity.  No definite pleural effusion or pneumothorax.  Unchanged bones.  IMPRESSION: 1.  Interval removal of intra-aortic balloon pump (confirmed per discussion with the pt's nurse).  Otherwise, stable position of remaining support apparatus.  No pneumothorax. 2.  Overall findings suggestive of minimally improved pulmonary edema and atelectasis.   Original Report Authenticated By: Tacey Ruiz, MD    Dg Chest Port 1 View  06/22/2012  *RADIOLOGY REPORT*  Clinical Data: Acute MI.  Respiratory failure.  Ventilator.  PORTABLE CHEST - 1 VIEW  Comparison: 06/21/2012  Findings: Endotracheal tube in good position.  Left jugular central venous catheter tip in the SVC.  Aortic balloon pump below the aortic arch, unchanged.  NG in the stomach.  Mild airspace opacity has developed bilaterally and may represent mild edema.  No significant effusion.  Lung volume is normal.  IMPRESSION: Support lines in good position unchanged.  Development of mild bilateral airspace disease, most consistent with mild edema.   Original Report Authenticated By: Janeece Riggers, M.D.    Dg Chest Port 1 View  06/21/2012  *RADIOLOGY  REPORT*  Clinical Data: Endotracheal tube positioning  PORTABLE CHEST - 1 VIEW  Comparison: 06/20/2012; 06/19/2012  Findings:  Grossly unchanged cardiac silhouette and mediastinal contours. Stable positioning of support apparatus including tip of intra- aortic balloon pump approximate 1.5 cm from the superior aspect of the aortic arch.  External pacing pads overlie the left heart border.  No pneumothorax.  Pulmonary vasculature remains indistinct.  Grossly unchanged perihilar and medial basilar heterogeneous opacities do not represent atelectasis.  No definite pleural effusion.  Unchanged bones.  IMPRESSION: 1.  Stable  positioning of support apparatus.  No pneumothorax. 2.  Grossly unchanged findings of pulmonary edema and bibasilar atelectasis.   Original Report Authenticated By: Tacey Ruiz, MD    Dg Chest Port 1 View  06/20/2012  *RADIOLOGY REPORT*  Clinical Data: New endotracheal tube placement  PORTABLE CHEST - 1 VIEW  Comparison: Portable chest x-ray of 06/19/2012  Findings: The tip of the endotracheal tube is approximately 3.3 cm above the carina.  Aeration has improved.  However, there is new opacity medially at the left lung base suspicious for atelectasis and/or pneumonia and there may be a small left effusion present. Cardiomegaly is stable.  An NG tube is noted into the stomach.  IMPRESSION:  1.  Tip of endotracheal tube approximately 3.3 cm above the carina. 2.  Increased opacity medially at the left lung base suspicious for atelectasis, pneumonia, and possibly left effusion.   Original Report Authenticated By: Dwyane Dee, M.D.    Dg Chest Portable 1 View  06/19/2012  *RADIOLOGY REPORT*  Clinical Data: Post CPR, evaluate support devices  PORTABLE CHEST - 1 VIEW  Comparison: None.  Findings: Endotracheal tube tip projects 4.5 cm proximal to the carina.  Left IJ catheter tip projects over the left brachiocephalic vein in the SVC confluence.  Heart size upper normal to mildly enlarged.  Interstitial prompts / peribronchial thickening.  NG tube descends into the abdomen, tip just below the GE junction.  Side port is just above the GE junction.  No pleural effusion or pneumothorax.  No acute osseous finding. Nondisplaced rib fractures related to resuscitation not excluded.  IMPRESSION: Endotracheal tube tip 4.5 cm proximal to the carina.  NG tube side port is above the GE junction, therefore advancement recommend.  Left IJ catheter tip projects over the left brachiocephalic vein near the SVC confluence.  No pneumothorax.  Interstitial prominence and peribronchial thickening may reflect acute edema or bronchitis  versus chronic bronchitic change.   Original Report Authenticated By: Jearld Lesch, M.D.     ASSESSMENT:  1. Anterior STEMI s/p cath with successful PCI of proximal occluded LAD  2. Cardiogenic shock - resolved and now off IABP and pressors 3. Cardiac arrest secondary to #1 - off hypothermia protocol  4. Tobacco abuse  5. VDFR with increased secretions from presumed bronchitis  6. Hypokalemia - resolved 7. Fever and elevated WBC secondary to bronchitis  8. Ischemic dilated CM - EF 20% on echo with large area of apical and anteroseptal akinesis   PLAN:  1. Continue ASA/Plavix  2.  Restart beta blocker as BP tolerates  3. Consider Neuro eval - per PCCM  4. Antibiotics per PCCM    Quintella Reichert, MD  06/23/2012  8:29 AM

## 2012-06-23 NOTE — Procedures (Signed)
EEG NUMBER:  REFERRING PHYSICIAN:  Felipa Evener, MD  HISTORY:  A 56 year old male, status post CPR and hypothermia protocol, now with altered mental status.  MEDICATIONS:  Plavix, Sublimaze, Zofran, Vancocin, NovoLog.  CONDITION OF RECORDING:  This is a 16-channel EEG carried out with the patient in the awake, drowsy, and asleep states.  DESCRIPTION:  The majority of the background rhythm consists of a polymorphic delta rhythm that is poorly organized and seen over both hemispheres.  There are some occasional intermixed theta seen as well. During these periods of slow background activity, there are some occasional sharp transients that resemble, a vertex central sharp transient seen in sleep.  When the patient is stimulated with the tactile stimulus, the patient's background rhythm activates and more theta is prominent.  Some artifact is noted during this time as well. Hyperventilation was not performed.  Intermittent photic stimulation failed to elicit any change in the tracing.  IMPRESSION:  This is an abnormal EEG secondary to general background slowing.  This tracing is reactive though and shows no evidence of epileptiform activity.          ______________________________ Thana Farr, MD    [id number] D:  06/22/2012 17:10:05  T:  06/23/2012 03:54:32  Job #:  [id number]

## 2012-06-24 LAB — CBC
HCT: 35.5 % — ABNORMAL LOW (ref 39.0–52.0)
Hemoglobin: 12.2 g/dL — ABNORMAL LOW (ref 13.0–17.0)
MCH: 31.5 pg (ref 26.0–34.0)
MCHC: 34.4 g/dL (ref 30.0–36.0)
MCV: 91.7 fL (ref 78.0–100.0)
Platelets: 144 10*3/uL — ABNORMAL LOW (ref 150–400)
RBC: 3.87 MIL/uL — ABNORMAL LOW (ref 4.22–5.81)
RDW: 13.6 % (ref 11.5–15.5)
WBC: 10.3 10*3/uL (ref 4.0–10.5)

## 2012-06-24 LAB — BASIC METABOLIC PANEL
BUN: 18 mg/dL (ref 6–23)
CO2: 28 mEq/L (ref 19–32)
Calcium: 9 mg/dL (ref 8.4–10.5)
Chloride: 97 mEq/L (ref 96–112)
Creatinine, Ser: 0.8 mg/dL (ref 0.50–1.35)
GFR calc Af Amer: >90 mL/min (ref >90)
GFR calc non Af Amer: >90 mL/min (ref >90)
Glucose, Bld: 98 mg/dL (ref 70–99)
Potassium: 3.2 mEq/L — ABNORMAL LOW (ref 3.5–5.1)
Sodium: 141 mEq/L (ref 135–145)

## 2012-06-24 LAB — GLUCOSE, CAPILLARY
Glucose-Capillary: 101 mg/dL — ABNORMAL HIGH (ref 70–99)
Glucose-Capillary: 103 mg/dL — ABNORMAL HIGH (ref 70–99)
Glucose-Capillary: 113 mg/dL — ABNORMAL HIGH (ref 70–99)
Glucose-Capillary: 91 mg/dL (ref 70–99)

## 2012-06-24 LAB — MAGNESIUM: Magnesium: 1.9 mg/dL (ref 1.5–2.5)

## 2012-06-24 LAB — PHOSPHORUS: Phosphorus: 3.6 mg/dL (ref 2.3–4.6)

## 2012-06-24 LAB — PRO B NATRIURETIC PEPTIDE: Pro B Natriuretic peptide (BNP): 6692 pg/mL — ABNORMAL HIGH (ref 0–125)

## 2012-06-24 MED ORDER — ALBUTEROL SULFATE (5 MG/ML) 0.5% IN NEBU
2.5000 mg | INHALATION_SOLUTION | RESPIRATORY_TRACT | Status: DC | PRN
  Filled 2012-06-24: qty 0.5

## 2012-06-24 MED ORDER — IPRATROPIUM BROMIDE 0.02 % IN SOLN
0.5000 mg | Freq: Four times a day (QID) | RESPIRATORY_TRACT | Status: DC
  Administered 2012-06-24 – 2012-06-26 (×9): 0.5 mg via RESPIRATORY_TRACT
  Filled 2012-06-24 (×9): qty 2.5

## 2012-06-24 MED ORDER — ASPIRIN 325 MG PO TABS
325.0000 mg | ORAL_TABLET | Freq: Every day | ORAL | Status: DC
  Administered 2012-06-24 – 2012-06-27 (×4): 325 mg via ORAL
  Filled 2012-06-24 (×4): qty 1

## 2012-06-24 MED ORDER — METOPROLOL TARTRATE 12.5 MG HALF TABLET
12.5000 mg | ORAL_TABLET | Freq: Two times a day (BID) | ORAL | Status: DC
  Administered 2012-06-24 – 2012-06-28 (×9): 12.5 mg via ORAL
  Filled 2012-06-24 (×12): qty 1

## 2012-06-24 MED ORDER — POTASSIUM CHLORIDE CRYS ER 20 MEQ PO TBCR
40.0000 meq | EXTENDED_RELEASE_TABLET | Freq: Two times a day (BID) | ORAL | Status: AC
  Administered 2012-06-24 (×2): 40 meq via ORAL
  Filled 2012-06-24 (×2): qty 2

## 2012-06-24 MED ORDER — HEPARIN SODIUM (PORCINE) 5000 UNIT/ML IJ SOLN
5000.0000 [IU] | Freq: Three times a day (TID) | INTRAMUSCULAR | Status: DC
  Administered 2012-06-24 – 2012-06-28 (×12): 5000 [IU] via SUBCUTANEOUS
  Filled 2012-06-24 (×16): qty 1

## 2012-06-24 MED ORDER — ALBUTEROL SULFATE (5 MG/ML) 0.5% IN NEBU
2.5000 mg | INHALATION_SOLUTION | Freq: Four times a day (QID) | RESPIRATORY_TRACT | Status: DC
  Administered 2012-06-24 – 2012-06-26 (×9): 2.5 mg via RESPIRATORY_TRACT
  Filled 2012-06-24 (×8): qty 0.5

## 2012-06-24 MED ORDER — ACETAMINOPHEN 160 MG/5ML PO SOLN
650.0000 mg | Freq: Four times a day (QID) | ORAL | Status: DC | PRN
  Filled 2012-06-24: qty 20.3

## 2012-06-24 MED ORDER — ADULT MULTIVITAMIN LIQUID CH
5.0000 mL | Freq: Every day | ORAL | Status: DC
  Administered 2012-06-25: 5 mL via ORAL
  Filled 2012-06-24: qty 5

## 2012-06-24 MED ORDER — BIOTENE DRY MOUTH MT LIQD
15.0000 mL | Freq: Two times a day (BID) | OROMUCOSAL | Status: DC
  Administered 2012-06-24 (×2): 15 mL via OROMUCOSAL

## 2012-06-24 MED ORDER — LEVOFLOXACIN 500 MG PO TABS
500.0000 mg | ORAL_TABLET | Freq: Every day | ORAL | Status: DC
  Administered 2012-06-24 – 2012-06-28 (×5): 500 mg via ORAL
  Filled 2012-06-24 (×6): qty 1

## 2012-06-24 NOTE — Progress Notes (Signed)
PULMONARY  / CRITICAL CARE MEDICINE  Name: [name] Walker MRN: [id number] DOB: [birth year]    LOS: 5  REFERRING MD :  [facility]ers EDP  CHIEF COMPLAINT:  Cardiac arrest  BRIEF PATIENT DESCRIPTION: 56 y/o male smoker with no known past medical history presented on 06/19/2012 to the [facility] ED in cardiac arrest in the setting of AW- STEMI.  Underwent 30 minutes of CPR and s/p  successful PCI of proximal occluded LAD, cardiogenic shock requiring pressors and IABP  LINES / TUBES: 12/20 ETT >>12/24 12/20 L IJ CVL >>  CULTURES: 12/20 rapid flu >>negatvie Blood 12/22>>> Urine 12/22>>>negative Sputum 12/22>>>negative  ANTIBIOTICS: Zosyn 12/21 >>>12/25 vanco 12/21 >>>12/25 Levaquin 12/25 >>>  SIGNIFICANT EVENTS:  Still has cough with green sputum.  Denies chest pain, wheeze.  VITAL SIGNS: Temp:  [94.5 F (34.7 C)-99.7 F (37.6 C)] 98.2 F (36.8 C) (12/25 0800) Pulse Rate:  [81-98] 97  (12/25 0900) Resp:  [15-23] 19  (12/25 0900) BP: (90-113)/(55-86) 107/81 mmHg (12/25 0900) SpO2:  [92 %-100 %] 97 % (12/25 0925) HEMODYNAMICS: CVP:  [2 mmHg-9 mmHg] 7 mmHg VENTILATOR SETTINGS:   INTAKE / OUTPUT: Intake/Output      12/24 0701 - 12/25 0700 12/25 0701 - 12/26 0700   I.V. (mL/kg) 520 (5.5)    NG/GT 350    IV Piggyback 1147.5    Total Intake(mL/kg) 2017.5 (21.5)    Urine (mL/kg/hr) 8425 (3.7)    Total Output 8425    Net -6407.5         Stool Occurrence 1 x     PHYSICAL EXAMINATION: Gen: No distress Neuro: Alert, follows commands, moves extremities HEENT: No sinus tenderness PULM: basilar rales, no wheeze   CV: s1s2 regular AB: soft, non-tender Ext: no edema  LABS: Cbc  Lab 06/24/12 0530 06/23/12 0440 06/22/12 0450  WBC 10.3 -- --  HGB 12.2* 10.4* 11.5*  HCT 35.5* 30.0* 33.5*  PLT 144* 131* 123*   Chemistry  Lab 06/24/12 0530 06/23/12 0440 06/22/12 0450  NA 141 141 138  K 3.2* 3.8 3.4*  CL 97 107 105  CO2 28 25 23   BUN 18 19 17   CREATININE 0.80 0.83 0.95  CALCIUM 9.0  8.7 8.5  MG 1.9 1.7 --  PHOS 3.6 1.8* --  GLUCOSE 98 97 124*   Liver fxn  Lab 06/19/12 0338  AST 94*  ALT 81*  ALKPHOS 82  BILITOT 0.3  PROT 6.8  ALBUMIN 3.9   coags  Lab 06/19/12 1200 06/19/12 0645 06/19/12 0338  APTT 58* 106* 34  INR 1.06 1.79* 0.99   Sepsis markers  Lab 06/23/12 0440 06/22/12 0450 06/21/12 0342  LATICACIDVEN -- -- --  PROCALCITON 0.70 1.30 2.69   Cardiac markers  Lab 06/19/12 0339  CKTOTAL 416*  CKMB 32.3*  TROPONINI 3.15*   BNP No results found for this basename: PROBNP:3 in the last 168 hours ABG  Lab 06/23/12 0445 06/22/12 0430 06/21/12 0405  PHART 7.473* 7.423 7.328*  PCO2ART 34.3* 38.5 42.8  PO2ART 67.7* 97.5 110.0*  HCO3 24.8* 24.4* 21.2  TCO2 25.9 25.5 22.4   CBG trend  Lab 06/24/12 0908 06/24/12 0440 06/23/12 2359 06/23/12 2049 06/23/12 1638  GLUCAP 113* 101* 91 103* 114*   IMAGING:  Dg Chest Port 1 View  06/23/2012  *RADIOLOGY REPORT*  Clinical Data: Endotracheal tube placement  PORTABLE CHEST - 1 VIEW  Comparison: 06/22/2012; 06/21/2012; 06/20/2012  Findings:  Grossly unchanged borderline enlarged cardiac silhouette. Unchanged mediastinal contours.  Interval removal of intra-aortic  balloon pump.  Otherwise, stable positioning of remaining support apparatus.  Minimal improved aeration of the lungs with persistent right peri and infrahilar opacities and left basilar opacities.  No new focal airspace opacity.  No definite pleural effusion or pneumothorax.  Unchanged bones.  IMPRESSION: 1.  Interval removal of intra-aortic balloon pump (confirmed per discussion with the pt's nurse).  Otherwise, stable position of remaining support apparatus.  No pneumothorax. 2.  Overall findings suggestive of minimally improved pulmonary edema and atelectasis.   Original Report Authenticated By: Tacey Ruiz, MD     DIAGNOSES: Principal Problem:  *STEMI (ST elevation myocardial infarction) Active Problems:  Cardiac arrest  Acute respiratory  failure  Ventricular fibrillation  Acute encephalopathy  Leukocytosis  Tobacco abuse  Cardiogenic shock  Ischemic dilated cardiomyopathy  ASSESSMENT / PLAN:  PULMONARY  ASSESSMENT: Acute respiratory failure 2nd to cardiac arrest and acute bronchitis. Resolved. Probable COPD with hx of smoking. Post-extubation stridor. Resolved. PLAN:   BD's q6h while awake, and q2h prn Adjust oxygen to keep SpO2 > 92%  CARDIOVASCULAR  ASSESSMENT:  STEMI. Cardiac arrest. Completed hypothermia protocol. Cardiogenic shock. Resolved. Acute systolic heart failure. PLAN:  Keep even to negative fluid balance Contineu ASA, plavix, lopressor per cardiology  RENAL  ASSESSMENT:   Oliguria. Resolved. Hypokalemia. PLAN:   Monitor renal fx, urine outpt F/u and replace electrolytes as needed  GASTROINTESTINAL  ASSESSMENT:   Nutrition PLAN:   Start full liquids and advance as tolerated SUP not indicated >> d/c pepcid, protonix  HEMATOLOGIC  ASSESSMENT:   Mild thrombocytopenia. PLAN:  F/u CBC  INFECTIOUS  ASSESSMENT:   Acute bronchitis>>less likely pneumonia.  PLAN:   Change Abx to levaquin 12/25  ENDOCRINE  ASSESSMENT:   1) no acute issues  PLAN:   Monitor glucose on BMET D/c SSI  NEUROLOGIC  ASSESSMENT:   1) Acute encephalopathy 2nd to cardiac arrest. Improved mental status 12/25.  Still has difficulty with short-term memory  PLAN:   - Monitor neuro. - PRN sedation. - Head CT not ordered but mental status is improved, EEG is pending.  Summary: D/c foley, CVL.  OOB to chair.  Advance diet as tolerated.  Transfer to SDU.  Will need sitter with fall precautions.  Coralyn Helling, MD [facility] Pulmonary/Critical Care 06/24/2012, 10:33 AM Pager:  [phone] After 3pm call: [phone]

## 2012-06-24 NOTE — Progress Notes (Signed)
Alert ,good eye contact. PERRLA . MAEW (strong & = bilat). Very slow to respond to simple questions .Answers simple math questions correctly.oriented to person and place .Stated correct  year .Does not know age ,month . Some short term memory loss noted .Getting OOB freq self w/o calling .Placed in chair , but then , Pt found sitting on bed. Dr informed .

## 2012-06-24 NOTE — Progress Notes (Addendum)
SUBJECTIVE:  Fully awake and responds appropriately to commands and questions  OBJECTIVE:   Vitals:   Filed Vitals:   06/24/12 0700 06/24/12 0800 06/24/12 0900 06/24/12 0925  BP: 112/76 100/75 107/81   Pulse: 95 94 97   Temp:  98.2 F (36.8 C)    TempSrc:  Oral    Resp: 17 22 19    Height:      Weight:      SpO2: 94% 94% 95% 97%   I&O's:   Intake/Output Summary (Last 24 hours) at 06/24/12 0934 Last data filed at 06/24/12 1610  Gross per 24 hour  Intake 1812.5 ml  Output   8100 ml  Net -6287.5 ml   TELEMETRY: Reviewed telemetry pt in NSR:     PHYSICAL EXAM General: Well developed, well nourished, in no acute distress Head: Eyes PERRLA, No xanthomas.   Normal cephalic and atramatic  Lungs:   Few crackles at left base Heart:   HRRR S1 S2 Pulses are 2+ & equal. Abdomen: Bowel sounds are positive, abdomen soft and non-tender without masses  Extremities:   No clubbing, cyanosis or edema.  DP +1 Neuro: Alert and oriented X 3. Psych:  Good affect, responds appropriately   LABS: Basic Metabolic Panel:  Basename 06/24/12 0530 06/23/12 0440  NA 141 141  K 3.2* 3.8  CL 97 107  CO2 28 25  GLUCOSE 98 97  BUN 18 19  CREATININE 0.80 0.83  CALCIUM 9.0 8.7  MG 1.9 1.7  PHOS 3.6 1.8*   Liver Function Tests: No results found for this basename: AST:2,ALT:2,ALKPHOS:2,BILITOT:2,PROT:2,ALBUMIN:2 in the last 72 hours No results found for this basename: LIPASE:2,AMYLASE:2 in the last 72 hours CBC:  Basename 06/24/12 0530 06/23/12 0440  WBC 10.3 11.0*  NEUTROABS -- --  HGB 12.2* 10.4*  HCT 35.5* 30.0*  MCV 91.7 93.5  PLT 144* 131*   Cardiac Enzymes: No results found for this basename: CKTOTAL:3,CKMB:3,CKMBINDEX:3,TROPONINI:3 in the last 72 hours BNP: No components found with this basename: POCBNP:3 D-Dimer: No results found for this basename: DDIMER:2 in the last 72 hours Hemoglobin A1C: No results found for this basename: HGBA1C in the last 72 hours Fasting Lipid  Panel: No results found for this basename: CHOL,HDL,LDLCALC,TRIG,CHOLHDL,LDLDIRECT in the last 72 hours Thyroid Function Tests: No results found for this basename: TSH,T4TOTAL,FREET3,T3FREE,THYROIDAB in the last 72 hours Anemia Panel: No results found for this basename: VITAMINB12,FOLATE,FERRITIN,TIBC,IRON,RETICCTPCT in the last 72 hours Coag Panel:   Lab Results  Component Value Date   INR 1.06 06/19/2012   INR 1.79* 06/19/2012   INR 0.99 06/19/2012    RADIOLOGY: Dg Chest Port 1 View  06/23/2012  *RADIOLOGY REPORT*  Clinical Data: Endotracheal tube placement  PORTABLE CHEST - 1 VIEW  Comparison: 06/22/2012; 06/21/2012; 06/20/2012  Findings:  Grossly unchanged borderline enlarged cardiac silhouette. Unchanged mediastinal contours.  Interval removal of intra-aortic balloon pump.  Otherwise, stable positioning of remaining support apparatus.  Minimal improved aeration of the lungs with persistent right peri and infrahilar opacities and left basilar opacities.  No new focal airspace opacity.  No definite pleural effusion or pneumothorax.  Unchanged bones.  IMPRESSION: 1.  Interval removal of intra-aortic balloon pump (confirmed per discussion with the pt's nurse).  Otherwise, stable position of remaining support apparatus.  No pneumothorax. 2.  Overall findings suggestive of minimally improved pulmonary edema and atelectasis.   Original Report Authenticated By: Tacey Ruiz, MD    Dg Chest Port 1 View  06/22/2012  *RADIOLOGY REPORT*  Clinical Data: Acute  MI.  Respiratory failure.  Ventilator.  PORTABLE CHEST - 1 VIEW  Comparison: 06/21/2012  Findings: Endotracheal tube in good position.  Left jugular central venous catheter tip in the SVC.  Aortic balloon pump below the aortic arch, unchanged.  NG in the stomach.  Mild airspace opacity has developed bilaterally and may represent mild edema.  No significant effusion.  Lung volume is normal.  IMPRESSION: Support lines in good position unchanged.   Development of mild bilateral airspace disease, most consistent with mild edema.   Original Report Authenticated By: Janeece Riggers, M.D.    Dg Chest Port 1 View  06/21/2012  *RADIOLOGY REPORT*  Clinical Data: Endotracheal tube positioning  PORTABLE CHEST - 1 VIEW  Comparison: 06/20/2012; 06/19/2012  Findings:  Grossly unchanged cardiac silhouette and mediastinal contours. Stable positioning of support apparatus including tip of intra- aortic balloon pump approximate 1.5 cm from the superior aspect of the aortic arch.  External pacing pads overlie the left heart border.  No pneumothorax.  Pulmonary vasculature remains indistinct.  Grossly unchanged perihilar and medial basilar heterogeneous opacities do not represent atelectasis.  No definite pleural effusion.  Unchanged bones.  IMPRESSION: 1.  Stable positioning of support apparatus.  No pneumothorax. 2.  Grossly unchanged findings of pulmonary edema and bibasilar atelectasis.   Original Report Authenticated By: Tacey Ruiz, MD    Dg Chest Port 1 View  06/20/2012  *RADIOLOGY REPORT*  Clinical Data: New endotracheal tube placement  PORTABLE CHEST - 1 VIEW  Comparison: Portable chest x-ray of 06/19/2012  Findings: The tip of the endotracheal tube is approximately 3.3 cm above the carina.  Aeration has improved.  However, there is new opacity medially at the left lung base suspicious for atelectasis and/or pneumonia and there may be a small left effusion present. Cardiomegaly is stable.  An NG tube is noted into the stomach.  IMPRESSION:  1.  Tip of endotracheal tube approximately 3.3 cm above the carina. 2.  Increased opacity medially at the left lung base suspicious for atelectasis, pneumonia, and possibly left effusion.   Original Report Authenticated By: Dwyane Dee, M.D.    Dg Chest Portable 1 View  06/19/2012  *RADIOLOGY REPORT*  Clinical Data: Post CPR, evaluate support devices  PORTABLE CHEST - 1 VIEW  Comparison: None.  Findings: Endotracheal tube  tip projects 4.5 cm proximal to the carina.  Left IJ catheter tip projects over the left brachiocephalic vein in the SVC confluence.  Heart size upper normal to mildly enlarged.  Interstitial prompts / peribronchial thickening.  NG tube descends into the abdomen, tip just below the GE junction.  Side port is just above the GE junction.  No pleural effusion or pneumothorax.  No acute osseous finding. Nondisplaced rib fractures related to resuscitation not excluded.  IMPRESSION: Endotracheal tube tip 4.5 cm proximal to the carina.  NG tube side port is above the GE junction, therefore advancement recommend.  Left IJ catheter tip projects over the left brachiocephalic vein near the SVC confluence.  No pneumothorax.  Interstitial prominence and peribronchial thickening may reflect acute edema or bronchitis versus chronic bronchitic change.   Original Report Authenticated By: Jearld Lesch, M.D.    ASSESSMENT:  1. Anterior STEMI s/p cath with successful PCI of proximal occluded LAD  2. Cardiogenic shock - resolved and now off IABP and pressors  3. Cardiac arrest secondary to #1 - off hypothermia protocol  4. Tobacco abuse  5. Hypokalemia   6. Fever and elevated WBC secondary to bronchitis  7. Ischemic dilated CM - EF 20% on echo with large area of apical and anteroseptal akinesis  PLAN:  1. Continue ASA/Plavix  2. Start Lopressor 12.5mg  BID as BP tolerates 3. Antibiotics per PCCM  4. Replete potassium 5. Will check a BNP - difficult to say if crackles at base due to edema or secretions       Quintella Reichert, MD  06/24/2012  9:34 AM

## 2012-06-24 NOTE — Progress Notes (Signed)
Drinking po fluids w/o diff.upper lobes remain CTA after po intake .Pt occ has a  Non-prod cough (since initial assessment ) which does not correlate to po intake.

## 2012-06-25 DIAGNOSIS — I2589 Other forms of chronic ischemic heart disease: Secondary | ICD-10-CM

## 2012-06-25 LAB — CBC
HCT: 36.1 % — ABNORMAL LOW (ref 39.0–52.0)
Hemoglobin: 12.2 g/dL — ABNORMAL LOW (ref 13.0–17.0)
MCH: 31.1 pg (ref 26.0–34.0)
MCHC: 33.8 g/dL (ref 30.0–36.0)
MCV: 92.1 fL (ref 78.0–100.0)
Platelets: 159 10*3/uL (ref 150–400)
RBC: 3.92 MIL/uL — ABNORMAL LOW (ref 4.22–5.81)
RDW: 13.4 % (ref 11.5–15.5)
WBC: 9.9 10*3/uL (ref 4.0–10.5)

## 2012-06-25 LAB — BASIC METABOLIC PANEL
BUN: 23 mg/dL (ref 6–23)
CO2: 25 mEq/L (ref 19–32)
Calcium: 9.4 mg/dL (ref 8.4–10.5)
Chloride: 103 mEq/L (ref 96–112)
Creatinine, Ser: 0.78 mg/dL (ref 0.50–1.35)
GFR calc Af Amer: >90 mL/min (ref >90)
GFR calc non Af Amer: >90 mL/min (ref >90)
Glucose, Bld: 100 mg/dL — ABNORMAL HIGH (ref 70–99)
Potassium: 4 mEq/L (ref 3.5–5.1)
Sodium: 142 mEq/L (ref 135–145)

## 2012-06-25 MED ORDER — COUMADIN BOOK
Freq: Once | Status: AC
  Administered 2012-06-25: 09:00:00
  Filled 2012-06-25: qty 1

## 2012-06-25 MED ORDER — ADULT MULTIVITAMIN W/MINERALS CH
1.0000 | ORAL_TABLET | Freq: Every day | ORAL | Status: DC
  Administered 2012-06-26 – 2012-06-28 (×3): 1 via ORAL
  Filled 2012-06-25 (×3): qty 1

## 2012-06-25 MED ORDER — ATORVASTATIN CALCIUM 40 MG PO TABS
40.0000 mg | ORAL_TABLET | Freq: Every day | ORAL | Status: DC
  Filled 2012-06-25: qty 1

## 2012-06-25 MED ORDER — ADULT MULTIVITAMIN W/MINERALS CH
1.0000 | ORAL_TABLET | Freq: Every day | ORAL | Status: DC
  Filled 2012-06-25: qty 1

## 2012-06-25 MED ORDER — WARFARIN VIDEO
Freq: Once | Status: AC
  Administered 2012-06-25: 09:00:00

## 2012-06-25 MED ORDER — POTASSIUM CHLORIDE CRYS ER 20 MEQ PO TBCR
20.0000 meq | EXTENDED_RELEASE_TABLET | Freq: Every day | ORAL | Status: DC
  Administered 2012-06-25 – 2012-06-26 (×2): 20 meq via ORAL
  Filled 2012-06-25 (×2): qty 1

## 2012-06-25 MED ORDER — WARFARIN SODIUM 7.5 MG PO TABS
7.5000 mg | ORAL_TABLET | Freq: Once | ORAL | Status: AC
  Administered 2012-06-25: 7.5 mg via ORAL
  Filled 2012-06-25: qty 1

## 2012-06-25 MED ORDER — WARFARIN - PHARMACIST DOSING INPATIENT: Freq: Every day | Status: DC

## 2012-06-25 MED ORDER — FUROSEMIDE 20 MG PO TABS
20.0000 mg | ORAL_TABLET | Freq: Every day | ORAL | Status: DC
  Administered 2012-06-25 – 2012-06-26 (×2): 20 mg via ORAL
  Filled 2012-06-25 (×3): qty 1

## 2012-06-25 NOTE — Progress Notes (Addendum)
PULMONARY  / CRITICAL CARE MEDICINE  Name: [name] Walker MRN: [id number] DOB: 17-Dec-1955    LOS: 6  REFERRING MD :  Powers EDP  CHIEF COMPLAINT:  Cardiac arrest  BRIEF PATIENT DESCRIPTION: 56 y/o male smoker with no known past medical history presented on 06/19/2012 to the [facility] ED in cardiac arrest in the setting of AW- STEMI.  Underwent 30 minutes of CPR and s/p  successful PCI of proximal occluded LAD, cardiogenic shock requiring pressors and IABP  LINES / TUBES: 12/20 ETT >>12/24 12/20 L IJ CVL >>  CULTURES: 12/20 rapid flu >>negatvie Blood 12/22>>> Urine 12/22>>>negative Sputum 12/22>>>negative  ANTIBIOTICS: Zosyn 12/21 >>>12/25 vanco 12/21 >>>12/25 Levaquin 12/25 >>>  SIGNIFICANT EVENTS:  Still has cough with green sputum.  Denies chest pain, wheeze.  VITAL SIGNS: Temp:  [97.9 F (36.6 C)-98.9 F (37.2 C)] 98.1 F (36.7 C) (12/26 1145) Pulse Rate:  [76-101] 80  (12/26 1145) Resp:  [17-20] 17  (12/26 1145) BP: (91-113)/(60-86) 110/67 mmHg (12/26 1145) SpO2:  [86 %-95 %] 93 % (12/26 1145) HEMODYNAMICS:   VENTILATOR SETTINGS:   INTAKE / OUTPUT: Intake/Output      12/25 0701 - 12/26 0700 12/26 0701 - 12/27 0700   P.O. 680 240   I.V. (mL/kg)     NG/GT     IV Piggyback     Total Intake(mL/kg) 680 (7.2) 240 (2.6)   Urine (mL/kg/hr) 1000 (0.4)    Total Output 1000    Net -320 +240        Urine Occurrence 1 x    Stool Occurrence 1 x     PHYSICAL EXAMINATION: Gen: No distress Neuro: Alert, follows commands, moves extremities to command. HEENT: No sinus tenderness PULM: basilar rales, no wheeze   CV: s1s2 regular AB: soft, non-tender Ext: no edema  LABS: Cbc  Lab 06/25/12 0652 06/24/12 0530 06/23/12 0440  WBC 9.9 -- --  HGB 12.2* 12.2* 10.4*  HCT 36.1* 35.5* 30.0*  PLT 159 144* 131*   Chemistry  Lab 06/25/12 0652 06/24/12 0530 06/23/12 0440  NA 142 141 141  K 4.0 3.2* 3.8  CL 103 97 107  CO2 25 28 25   BUN 23 18 19   CREATININE 0.78 0.80 0.83    CALCIUM 9.4 9.0 8.7  MG -- 1.9 1.7  PHOS -- 3.6 1.8*  GLUCOSE 100* 98 97   Liver fxn  Lab 06/19/12 0338  AST 94*  ALT 81*  ALKPHOS 82  BILITOT 0.3  PROT 6.8  ALBUMIN 3.9   coags  Lab 06/19/12 1200 06/19/12 0645 06/19/12 0338  APTT 58* 106* 34  INR 1.06 1.79* 0.99   Sepsis markers  Lab 06/23/12 0440 06/22/12 0450 06/21/12 0342  LATICACIDVEN -- -- --  PROCALCITON 0.70 1.30 2.69   Cardiac markers  Lab 06/19/12 0339  CKTOTAL 416*  CKMB 32.3*  TROPONINI 3.15*   BNP  Lab 06/24/12 1449  PROBNP 6692.0*   ABG  Lab 06/23/12 0445 06/22/12 0430 06/21/12 0405  PHART 7.473* 7.423 7.328*  PCO2ART 34.3* 38.5 42.8  PO2ART 67.7* 97.5 110.0*  HCO3 24.8* 24.4* 21.2  TCO2 25.9 25.5 22.4   CBG trend  Lab 06/24/12 0908 06/24/12 0440 06/23/12 2359 06/23/12 2049 06/23/12 1638  GLUCAP 113* 101* 91 103* 114*    Intake/Output Summary (Last 24 hours) at 06/25/12 1234 Last data filed at 06/25/12 0800  Gross per 24 hour  Intake    740 ml  Output    600 ml  Net  140 ml   IMAGING:  No results found.  DIAGNOSES: Principal Problem:  *STEMI (ST elevation myocardial infarction) Active Problems:  Cardiac arrest  Acute respiratory failure  Ventricular fibrillation  Acute encephalopathy  Leukocytosis  Tobacco abuse  Cardiogenic shock  Ischemic dilated cardiomyopathy  ASSESSMENT / PLAN:  PULMONARY  ASSESSMENT: Acute respiratory failure 2nd to cardiac arrest and acute bronchitis. Resolved. Probable COPD with hx of smoking. Post-extubation stridor. Resolved. PLAN:   BD's q6h while awake, and q2h prn Adjust oxygen to keep SpO2 > 92%  CARDIOVASCULAR  ASSESSMENT:  STEMI. Cardiac arrest. Completed hypothermia protocol. Cardiogenic shock. Resolved. Acute systolic heart failure. PLAN:  Keep even to negative fluid balance. Anticoagulation due to risk of LV thrombus. Contineu ASA, plavix, lopressor per cardiology. Lasix 20 mg PO daily with K replacement per  cardiology.  RENAL  ASSESSMENT:   Oliguria. Resolved. Hypokalemia. PLAN:   Monitor renal fx, urine outpt F/u and replace electrolytes as needed  GASTROINTESTINAL  ASSESSMENT:   Nutrition PLAN:   Heart healthy diet. SUP not indicated >> d/c pepcid, protonix.  HEMATOLOGIC  ASSESSMENT:   Mild thrombocytopenia. PLAN:  F/u CBC  INFECTIOUS  ASSESSMENT:   Acute bronchitis>>less likely pneumonia.  PLAN:   Change Abx to levaquin 12/25  ENDOCRINE  ASSESSMENT:   1) no acute issues  PLAN:   Monitor glucose on BMET D/c SSI  NEUROLOGIC  ASSESSMENT:   1) Acute encephalopathy 2nd to cardiac arrest. Improved mental status 12/25.  Still has difficulty with short-term memory  PLAN:   - Monitor neuro. - Fentanyl PRN. - Head CT not ordered but mental status is improved, EEG is pending done but not read.  Summary: D/c foley, CVL.  OOB to chair.  Advance diet as tolerated.  Transfer to SDU.  Will need sitter with fall precautions.  PCCM will sign off, please call back if needed.  Alyson Reedy, M.D. [facility] Pulmonary/Critical Care Medicine. Pager: [phone]. After hours pager: [phone].

## 2012-06-25 NOTE — Progress Notes (Signed)
CARDIAC REHAB PHASE I   PRE:  Rate/Rhythm: 84 SR    BP: sitting 102/67    SaO2: 95 RA  MODE:  Ambulation: 350 ft   POST:  Rate/Rhythm: 103 ST    BP: sitting 93/60     SaO2: 96 RA  Pt anxious to walk and go home. Thankful to be walking. Used RW, assist x2. Pt did not want to use RW but needed it as he was unsteady, not placing feet appropriately. Popped up wheels on RW occasionally, scissored feet. Pt able to recall families birthdays. To recliner after walk. VSS. Suggest PT involvement to assist with gait and d/c planning.  [phone]  [name] Walker CES, ACSM

## 2012-06-25 NOTE — Progress Notes (Addendum)
SUBJECTIVE:  Sitting up in a chair with no complaints  OBJECTIVE:   Vitals:   Filed Vitals:   06/25/12 0500 06/25/12 0600 06/25/12 0724 06/25/12 0725  BP:    97/75  Pulse: 101 76 83   Temp:   98.9 F (37.2 C)   TempSrc:   Oral   Resp:      Height:      Weight:      SpO2: 94% 90%  94%   I&O's:   Intake/Output Summary (Last 24 hours) at 06/25/12 0745 Last data filed at 06/25/12 0300  Gross per 24 hour  Intake    680 ml  Output   1000 ml  Net   -320 ml   TELEMETRY: Reviewed telemetry pt in NSR:     PHYSICAL EXAM General: Well developed, well nourished, in no acute distress Head: Eyes PERRLA, No xanthomas.   Normal cephalic and atramatic  Lungs:   Clear bilaterally to auscultation and percussion. Heart:   HRRR S1 S2 Pulses are 2+ & equal. Abdomen: Bowel sounds are positive, abdomen soft and non-tender without masses  Extremities:   No clubbing, cyanosis or edema.  DP +1 Neuro: Alert and oriented X 3. Psych:  Good affect, responds appropriately   LABS: Basic Metabolic Panel:  Basename 06/25/12 0652 06/24/12 0530 06/23/12 0440  NA 142 141 --  K 4.0 3.2* --  CL 103 97 --  CO2 25 28 --  GLUCOSE 100* 98 --  BUN 23 18 --  CREATININE 0.78 0.80 --  CALCIUM 9.4 9.0 --  MG -- 1.9 1.7  PHOS -- 3.6 1.8*   Liver Function Tests: No results found for this basename: AST:2,ALT:2,ALKPHOS:2,BILITOT:2,PROT:2,ALBUMIN:2 in the last 72 hours No results found for this basename: LIPASE:2,AMYLASE:2 in the last 72 hours CBC:  Basename 06/25/12 0652 06/24/12 0530  WBC 9.9 10.3  NEUTROABS -- --  HGB 12.2* 12.2*  HCT 36.1* 35.5*  MCV 92.1 91.7  PLT 159 144*   Coag Panel:   Lab Results  Component Value Date   INR 1.06 06/19/2012   INR 1.79* 06/19/2012   INR 0.99 06/19/2012    RADIOLOGY: Dg Chest Port 1 View  06/23/2012  *RADIOLOGY REPORT*  Clinical Data: Endotracheal tube placement  PORTABLE CHEST - 1 VIEW  Comparison: 06/22/2012; 06/21/2012; 06/20/2012  Findings:  Grossly  unchanged borderline enlarged cardiac silhouette. Unchanged mediastinal contours.  Interval removal of intra-aortic balloon pump.  Otherwise, stable positioning of remaining support apparatus.  Minimal improved aeration of the lungs with persistent right peri and infrahilar opacities and left basilar opacities.  No new focal airspace opacity.  No definite pleural effusion or pneumothorax.  Unchanged bones.  IMPRESSION: 1.  Interval removal of intra-aortic balloon pump (confirmed per discussion with the pt's nurse).  Otherwise, stable position of remaining support apparatus.  No pneumothorax. 2.  Overall findings suggestive of minimally improved pulmonary edema and atelectasis.   Original Report Authenticated By: Tacey Ruiz, MD    Dg Chest Port 1 View  06/22/2012  *RADIOLOGY REPORT*  Clinical Data: Acute MI.  Respiratory failure.  Ventilator.  PORTABLE CHEST - 1 VIEW  Comparison: 06/21/2012  Findings: Endotracheal tube in good position.  Left jugular central venous catheter tip in the SVC.  Aortic balloon pump below the aortic arch, unchanged.  NG in the stomach.  Mild airspace opacity has developed bilaterally and may represent mild edema.  No significant effusion.  Lung volume is normal.  IMPRESSION: Support lines in good position unchanged.  Development of mild bilateral airspace disease, most consistent with mild edema.   Original Report Authenticated By: Janeece Riggers, M.D.    Dg Chest Port 1 View  06/21/2012  *RADIOLOGY REPORT*  Clinical Data: Endotracheal tube positioning  PORTABLE CHEST - 1 VIEW  Comparison: 06/20/2012; 06/19/2012  Findings:  Grossly unchanged cardiac silhouette and mediastinal contours. Stable positioning of support apparatus including tip of intra- aortic balloon pump approximate 1.5 cm from the superior aspect of the aortic arch.  External pacing pads overlie the left heart border.  No pneumothorax.  Pulmonary vasculature remains indistinct.  Grossly unchanged perihilar and medial  basilar heterogeneous opacities do not represent atelectasis.  No definite pleural effusion.  Unchanged bones.  IMPRESSION: 1.  Stable positioning of support apparatus.  No pneumothorax. 2.  Grossly unchanged findings of pulmonary edema and bibasilar atelectasis.   Original Report Authenticated By: Tacey Ruiz, MD    Dg Chest Port 1 View  06/20/2012  *RADIOLOGY REPORT*  Clinical Data: New endotracheal tube placement  PORTABLE CHEST - 1 VIEW  Comparison: Portable chest x-ray of 06/19/2012  Findings: The tip of the endotracheal tube is approximately 3.3 cm above the carina.  Aeration has improved.  However, there is new opacity medially at the left lung base suspicious for atelectasis and/or pneumonia and there may be a small left effusion present. Cardiomegaly is stable.  An NG tube is noted into the stomach.  IMPRESSION:  1.  Tip of endotracheal tube approximately 3.3 cm above the carina. 2.  Increased opacity medially at the left lung base suspicious for atelectasis, pneumonia, and possibly left effusion.   Original Report Authenticated By: Dwyane Dee, M.D.    Dg Chest Portable 1 View  06/19/2012  *RADIOLOGY REPORT*  Clinical Data: Post CPR, evaluate support devices  PORTABLE CHEST - 1 VIEW  Comparison: None.  Findings: Endotracheal tube tip projects 4.5 cm proximal to the carina.  Left IJ catheter tip projects over the left brachiocephalic vein in the SVC confluence.  Heart size upper normal to mildly enlarged.  Interstitial prompts / peribronchial thickening.  NG tube descends into the abdomen, tip just below the GE junction.  Side port is just above the GE junction.  No pleural effusion or pneumothorax.  No acute osseous finding. Nondisplaced rib fractures related to resuscitation not excluded.  IMPRESSION: Endotracheal tube tip 4.5 cm proximal to the carina.  NG tube side port is above the GE junction, therefore advancement recommend.  Left IJ catheter tip projects over the left brachiocephalic vein  near the SVC confluence.  No pneumothorax.  Interstitial prominence and peribronchial thickening may reflect acute edema or bronchitis versus chronic bronchitic change.   Original Report Authenticated By: Jearld Lesch, M.D.    ASSESSMENT:  1. Anterior STEMI s/p cath with successful PCI of proximal occluded LAD  2. Cardiogenic shock - resolved and now off IABP and pressors  3. Cardiac arrest secondary to #1 - off hypothermia protocol  4. Tobacco abuse  5. Hypokalemia  6. Fever and elevated WBC secondary to bronchitis  7. Ischemic dilated CM - EF 20% on echo with large area of apical and anteroseptal akinesis  PLAN:  1. Continue ASA/Plavix/beta blocker 2. Start Lipitor 40mg  daily 3. Antibiotics per PCCM  4. Add Lasix 20mg  daily due to severe LV dysfunction 5. Kdur daily 6.  Due to large area of anterior and apical akinesis on echo he is at increased risk of developing LV thrombus so will add Coumadin and recheck  echo in 3 months  7.  OK from cardiac standpoint to move to tele bed 8.  Cardiac rehab        Quintella Reichert, MD  06/25/2012  7:45 AM

## 2012-06-25 NOTE — Progress Notes (Signed)
ANTICOAGULATION CONSULT NOTE - Initial Consult  Pharmacy Consult for coumadin Indication: increased risk of developing LV thrombus   No Known Allergies  Patient Measurements: Height: 5\' 9"  (175.3 cm) Weight: 206 lb 12.7 oz (93.8 kg) IBW/kg (Calculated) : 70.7   Vital Signs: Temp: 98.9 F (37.2 C) (12/26 0724) Temp src: Oral (12/26 0724) BP: 97/75 mmHg (12/26 0725) Pulse Rate: 83  (12/26 0724)  Labs:  Basename 06/25/12 0652 06/24/12 0530 06/23/12 0440  HGB 12.2* 12.2* --  HCT 36.1* 35.5* 30.0*  PLT 159 144* 131*  APTT -- -- --  LABPROT -- -- --  INR -- -- --  HEPARINUNFRC -- -- --  CREATININE 0.78 0.80 0.83  CKTOTAL -- -- --  CKMB -- -- --  TROPONINI -- -- --    Estimated Creatinine Clearance: 117.9 ml/min (by C-G formula based on Cr of 0.78).   Medical History: Past Medical History  Diagnosis Date  . Varicose veins   . No pertinent past medical history     Medications:  Scheduled:    . albuterol  2.5 mg Nebulization Q6H WA  . antiseptic oral rinse  15 mL Mouth Rinse q12n4p  . aspirin  325 mg Oral Daily  . atorvastatin  40 mg Oral q1800  . chlorhexidine  15 mL Mouth/Throat BID  . clopidogrel  75 mg Oral Q breakfast  . furosemide  20 mg Oral Daily  . heparin subcutaneous  5,000 Units Subcutaneous Q8H  . ipratropium  0.5 mg Nebulization Q6H WA  . levofloxacin  500 mg Oral Daily  . metoprolol tartrate  12.5 mg Oral BID  . multivitamin  5 mL Oral Daily  . potassium chloride  20 mEq Oral Daily  . [COMPLETED] potassium chloride  40 mEq Oral BID  . [DISCONTINUED] albuterol  2.5 mg Nebulization Q4H  . [DISCONTINUED] aspirin  300 mg Rectal Daily  . [DISCONTINUED] famotidine (PEPCID) IV  20 mg Intravenous Q12H  . [DISCONTINUED] insulin aspart  2-6 Units Subcutaneous Q4H  . [DISCONTINUED] ipratropium  0.5 mg Nebulization Q4H  . [DISCONTINUED] multivitamin  5 mL Per Tube Daily  . [DISCONTINUED] pantoprazole (PROTONIX) IV  40 mg Intravenous Q24H  .  [DISCONTINUED] piperacillin-tazobactam (ZOSYN)  IV  3.375 g Intravenous Q8H  . [DISCONTINUED] Racepinephrine HCl  0.5 mL Nebulization Once  . [DISCONTINUED] vancomycin  1,000 mg Intravenous Q8H    Assessment: 56 yo male s/p Vfib arrest w/ MI and large anteroapical MI with akinesis (at increased risk of developing LV thrombus). Baseline INR= 1.06 on 12/20. Patient to begin coumadin (noted on plavix, asa and heparin sq)  Goal of Therapy:  INR 2-3 Monitor platelets by anticoagulation protocol: Yes   Plan:  -Coumadin 7.5mg  po today -Daily PT/INR -Will begin education process  Harland German, Pharm D 06/25/2012 8:29 AM

## 2012-06-25 NOTE — Evaluation (Signed)
Clinical/Bedside Swallow Evaluation Patient Details  Name: [name] Walker MRN: [id number] Date of Birth: 08-19-1955  Today's Date: 06/25/2012 Time: 1230-1243 SLP Time Calculation (min): 13 min  Past Medical History:  Past Medical History  Diagnosis Date  . Varicose veins   . No pertinent past medical history    Past Surgical History: History reviewed. No pertinent past surgical history. HPI:  56 yr old admitted after fall, cardiac arrest, STEMI, V-fib with CPR 30 min.  Intubated 12/20-12/24.  CXR with improved pulmonary edema and atelectasis.   Assessment / Plan / Recommendation Clinical Impression  Pt. alert, requires cueing with family present at bedside.  Pt. and family deny prior dysphagia or difficulty swallowing since diet initiated yesterday.  Pt. also denies odnophagia.  He exhibited functional orophayrngeal swallow function.  Oral prep, mastication and transit WFL's.  No pharyngeal impairments observed with thin via cup/straw, puree and regular texture.  Pt. consistently requesting to go home.  Recommend regular texture and thin liquids, pills with liquid.  No f/u swallow treatement required at this time.       Aspiration Risk  Mild    Diet Recommendation Regular;Thin liquid   Liquid Administration via: Cup;Straw Medication Administration: Whole meds with liquid Supervision: Patient able to self feed;Intermittent supervision to cue for compensatory strategies Compensations: Slow rate;Small sips/bites Postural Changes and/or Swallow Maneuvers: Seated upright 90 degrees    Other  Recommendations Oral Care Recommendations: Oral care BID   Follow Up Recommendations  None    Frequency and Duration            Swallow Study Prior Functional Status          Oral/Motor/Sensory Function Overall Oral Motor/Sensory Function: Appears within functional limits for tasks assessed   Ice Chips Ice chips: Not tested   Thin Liquid Thin Liquid: Within functional  limits Presentation: Cup;Straw    Nectar Thick Nectar Thick Liquid: Not tested   Honey Thick Honey Thick Liquid: Not tested   Puree Puree: Within functional limits Presentation: Self Fed;Spoon   Solid       Solid: Within functional limits Presentation: Self Fed       Royce Macadamia M.Ed [employer] [phone]  06/25/2012

## 2012-06-26 LAB — BASIC METABOLIC PANEL
BUN: 25 mg/dL — ABNORMAL HIGH (ref 6–23)
BUN: 26 mg/dL — ABNORMAL HIGH (ref 6–23)
CO2: 25 mEq/L (ref 19–32)
CO2: 25 mEq/L (ref 19–32)
Calcium: 9.3 mg/dL (ref 8.4–10.5)
Calcium: 9.6 mg/dL (ref 8.4–10.5)
Chloride: 102 mEq/L (ref 96–112)
Chloride: 101 mEq/L (ref 96–112)
Creatinine, Ser: 0.78 mg/dL (ref 0.50–1.35)
Creatinine, Ser: 0.81 mg/dL (ref 0.50–1.35)
GFR calc Af Amer: >90 mL/min (ref >90)
GFR calc Af Amer: >90 mL/min (ref >90)
GFR calc non Af Amer: >90 mL/min (ref >90)
GFR calc non Af Amer: >90 mL/min (ref >90)
Glucose, Bld: 92 mg/dL (ref 70–99)
Glucose, Bld: 97 mg/dL (ref 70–99)
Potassium: 3.8 mEq/L (ref 3.5–5.1)
Potassium: 4.1 mEq/L (ref 3.5–5.1)
Sodium: 140 mEq/L (ref 135–145)
Sodium: 139 mEq/L (ref 135–145)

## 2012-06-26 LAB — MAGNESIUM: Magnesium: 1.9 mg/dL (ref 1.5–2.5)

## 2012-06-26 LAB — PHOSPHORUS: Phosphorus: 3.4 mg/dL (ref 2.3–4.6)

## 2012-06-26 LAB — CBC
HCT: 38.4 % — ABNORMAL LOW (ref 39.0–52.0)
Hemoglobin: 13.2 g/dL (ref 13.0–17.0)
MCH: 32 pg (ref 26.0–34.0)
MCHC: 34.4 g/dL (ref 30.0–36.0)
MCV: 93.2 fL (ref 78.0–100.0)
Platelets: 177 10*3/uL (ref 150–400)
RBC: 4.12 MIL/uL — ABNORMAL LOW (ref 4.22–5.81)
RDW: 13.6 % (ref 11.5–15.5)
WBC: 11.8 10*3/uL — ABNORMAL HIGH (ref 4.0–10.5)

## 2012-06-26 LAB — PROTIME-INR
INR: 1.15 (ref 0.00–1.49)
Prothrombin Time: 14.5 seconds (ref 11.6–15.2)

## 2012-06-26 MED ORDER — WARFARIN SODIUM 7.5 MG PO TABS
7.5000 mg | ORAL_TABLET | Freq: Once | ORAL | Status: AC
  Administered 2012-06-26: 7.5 mg via ORAL
  Filled 2012-06-26: qty 1

## 2012-06-26 MED ORDER — SPIRONOLACTONE 25 MG PO TABS
25.0000 mg | ORAL_TABLET | Freq: Two times a day (BID) | ORAL | Status: DC
  Administered 2012-06-26 – 2012-06-27 (×2): 25 mg via ORAL
  Filled 2012-06-26 (×4): qty 1

## 2012-06-26 NOTE — Progress Notes (Signed)
Occupational Therapy Note:  Met with CM, MD, and pt's girlfriend to discuss cognitive deficits noted.  G-friend concerned about pt driving, and it was reinforced that is recommended that he not drive until he is cleared by MD; also discussed recommendations for OP rehab, as well as possible behaviors she may observe at home, and how to best deal with them.  She verbalized understanding of all.   Jeani Hawking, OTR/L [phone]

## 2012-06-26 NOTE — Evaluation (Signed)
Speech Language Pathology Evaluation Patient Details Name: [name] Walker MRN: [id number] DOB: 04/08/1956 Today's Date: 06/26/2012 Time: 1540-1610 SLP Time Calculation (min): 30 min  Problem List:  Patient Active Problem List  Diagnosis  . Cardiac arrest  . Acute respiratory failure  . STEMI (ST elevation myocardial infarction)  . Ventricular fibrillation  . Acute encephalopathy  . Leukocytosis  . Tobacco abuse  . Cardiogenic shock  . Ischemic dilated cardiomyopathy   Past Medical History:  Past Medical History  Diagnosis Date  . Varicose veins   . No pertinent past medical history    Past Surgical History: History reviewed. No pertinent past surgical history. HPI:  56 y.o. male admitted after fall, cardiac arrest, STEMI, V-fib with CPR 30 min. Intubated 12/20-12/24.  PT and OT noticed that patient was exhibiting some cognitive problems on 12/25, and was having difficulty with short-term memory.Therefore, MD ordered SLE to assess cognitive function, as well as Neurology consult.   Assessment / Plan / Recommendation Clinical Impression  Patient did not demonstrate any significant cognitive deficits at basic and mild complex levels, however exhibited mild attention and organization deficits at complex level. Based on his apparent fast rate of cognitive improvement, and that there were no deficits of safety awareness, functional -basic problem solving, reasoning, patient will not benefit from cognitive-lingusitic treatment at this venue of care.   Neurology consult on 12/27:  "56 yo M with mild hypoxic encephalopathy that is rapidly improving. His deficits are mainly with attention and impulse control which are predominantly frontal findings. I suspect that he has had some injury, but with the rapid improvement he has displayed, I expect him to continue to improve. I do not feel that imaging would significantly alter management at this time, though if he continues to have problems down  the line, it may be reasonable in addition to psychometric testing for a more formal assessment "        SLP Assessment  All further Speech Lanaguage Pathology  needs can be addressed in the next venue of care    Follow Up Recommendations  Outpatient SLP (for complex-level cognitive-linguistic assessment)  If patient continues with complex-level cognitive impairment.   Frequency and Duration   N/A at this venue     Pertinent Vitals/Pain Patient did not c/o pain, and no pertinent vitals/vital changes during this assessment.   SLP Goals  SLP Goals Potential to Achieve Goals: Good Progress/Goals/Alternative treatment plan discussed with pt/caregiver and they: Agree  SLP Evaluation Prior Functioning  Cognitive/Linguistic Baseline: Within functional limits Type of Home: House Lives With: Significant other (girlfriend) Available Help at Discharge: Family;Available 24 hours/day Education: Completed Sophomore year of college.  Vocation: Full time employment Lawyer)   Cognition  Arousal/Alertness: Awake/alert Orientation Level: Oriented X4 Attention: Selective;Alternating Selective Attention: Appears intact Alternating Attention: Impaired (mild impairment) Alternating Attention Impairment: Verbal complex Memory: Appears intact Awareness: Appears intact Problem Solving: Appears intact Executive Function: Reasoning;Organizing Reasoning: Appears intact Organizing: Impaired (mild impairment) Organizing Impairment: Verbal complex Behaviors: Impulsive (mild impulsivity with verbal responses) Safety/Judgment: Appears intact ("I understand that I have to take it easy") Comments: Patient's girlfriend stated that "Yesterday he was acting like a little 56 year old", but that today he seemed fairly normal.    Comprehension  Auditory Comprehension Overall Auditory Comprehension: Appears within functional limits for tasks assessed Yes/No Questions: Within  Functional Limits Commands: Within Functional Limits Conversation: Complex Interfering Components: Attention (mild attention deficit) EffectiveTechniques: Extra processing time Visual Recognition/Discrimination Discrimination: Within Function  Limits Reading Comprehension Reading Status: Within funtional limits    Expression Expression Primary Mode of Expression: Verbal Verbal Expression Overall Verbal Expression: Appears within functional limits for tasks assessed   Oral / Motor Oral Motor/Sensory Function Overall Oral Motor/Sensory Function: Appears within functional limits for tasks assessed   GO     Pablo Lawrence 06/26/2012, 5:29 PM  Angela Nevin, MA, CCC-SLP [facility] Speech-Language Pathologist

## 2012-06-26 NOTE — Progress Notes (Signed)
ANTICOAGULATION CONSULT NOTE   Pharmacy Consult for coumadin Indication: increased risk of developing LV thrombus   No Known Allergies  Patient Measurements: Height: 5\' 9"  (175.3 cm) Weight: 206 lb 12.7 oz (93.8 kg) IBW/kg (Calculated) : 70.7   Vital Signs: Temp: 98.8 F (37.1 C) (12/27 0525) Temp src: Oral (12/27 0525) BP: 105/76 mmHg (12/27 0525) Pulse Rate: 78  (12/27 0525)  Labs:  Basename 06/26/12 0500 06/25/12 0652 06/24/12 0530  HGB 13.2 12.2* --  HCT 38.4* 36.1* 35.5*  PLT 177 159 144*  APTT -- -- --  LABPROT 14.5 -- --  INR 1.15 -- --  HEPARINUNFRC -- -- --  CREATININE 0.78 0.78 0.80  CKTOTAL -- -- --  CKMB -- -- --  TROPONINI -- -- --    Estimated Creatinine Clearance: 117.9 ml/min (by C-G formula based on Cr of 0.78).   Medical History: Past Medical History  Diagnosis Date  . Varicose veins   . No pertinent past medical history     Medications:  Scheduled:     . albuterol  2.5 mg Nebulization Q6H WA  . aspirin  325 mg Oral Daily  . clopidogrel  75 mg Oral Q breakfast  . furosemide  20 mg Oral Daily  . heparin subcutaneous  5,000 Units Subcutaneous Q8H  . ipratropium  0.5 mg Nebulization Q6H WA  . levofloxacin  500 mg Oral Daily  . metoprolol tartrate  12.5 mg Oral BID  . multivitamin with minerals  1 tablet Oral Daily  . potassium chloride  20 mEq Oral Daily  . [COMPLETED] warfarin  7.5 mg Oral ONCE-1800  . Warfarin - Pharmacist Dosing Inpatient   Does not apply q1800  . [DISCONTINUED] antiseptic oral rinse  15 mL Mouth Rinse q12n4p  . [DISCONTINUED] atorvastatin  40 mg Oral q1800  . [DISCONTINUED] chlorhexidine  15 mL Mouth/Throat BID  . [DISCONTINUED] multivitamin  5 mL Oral Daily  . [DISCONTINUED] multivitamin with minerals  1 tablet Oral Daily    Assessment: 56 yo male s/p Vfib arrest w/ MI and large anteroapical MI with akinesis (at increased risk of developing LV thrombus). INR today 1.15  Patient to cont coumadin (noted on  plavix, asa and heparin sq) No bleeding complications noted  Goal of Therapy:  INR 2-3 Monitor platelets by anticoagulation protocol: Yes   Plan:  -Coumadin 7.5mg  po today -Daily PT/INR -Will begin education process  Talbert Cage, Pharm D 06/26/2012 9:14 AM

## 2012-06-26 NOTE — Consult Note (Signed)
Reason for Consult: Possible hypoxic injnury Referring Physician:  Verdis Prime  CC: "I'm fine"  History is obtained from:Patient, wife  HPI: [name] Walker is a 56 y.o. male who suffered an anterior wall MI with cardiac arrest and CPR for 30 minutes. He was admitted and started on the hypothermia protocol. Following rewarming, it was noticed taht he was encephalopathic, but he quickly improved. It was noted on 12/25 that he was having difficulty with short term memory. Yesterday, his fiance noticed that he was "acting like a 56 year old." He was making inappropriate comments and had problems focusing. Today, she feels that he is completely back to his normal self. Therapies felt that he may have some higher cognitive problems and therefore neurology has been consulted.   ROS: A 14 point ROS was performed and is negative except as noted in the HPI.  Past Medical History  Diagnosis Date  . Varicose veins   . No pertinent past medical history     Family History: Brother - stroke at age 48  Social History: Tob: smoked until his arrest.   Exam: Current vital signs: BP 99/62  Pulse 71  Temp 98.5 F (36.9 C) (Oral)  Resp 18  Ht 5\' 9"  (1.753 m)  Wt 93.8 kg (206 lb 12.7 oz)  BMI 30.54 kg/m2  SpO2 92% Vital signs in last 24 hours: Temp:  [97.6 F (36.4 C)-98.8 F (37.1 C)] 98.5 F (36.9 C) (12/27 1353) Pulse Rate:  [71-89] 71  (12/27 1353) Resp:  [18] 18  (12/27 1353) BP: (97-107)/(62-80) 99/62 mmHg (12/27 1353) SpO2:  [92 %-97 %] 92 % (12/27 1353)  General: In bed, NAD CV: RRR Mental Status: Patient is awake, alert, oriented to person, place, month, year, and situation. Immediate and remote memory are intact. Patient is able to give a clear and coherent history. World backwards = D-L-[id number]-R-W, Serial 7s 4/5 Able to give # of quarters in $2.75 and add 7+5 Forward digit span = 5 Cranial Nerves: II: Visual Fields are full. Pupils are equal, round, and reactive to light.  Discs  are difficult to visualize. When testing visual fields, patietn has difficulty suppressing urge to look at fingers.  III,IV, VI: EOMI without ptosis or diploplia.  V: Facial sensation is symmetric to temperature VII: Facial movement is symmetric.  VIII: hearing is intact to voice X: Uvula elevates symmetrically XI: Shoulder shrug is symmetric. XII: tongue is midline without atrophy or fasciculations.  Motor: Tone is normal. Bulk is normal. 5/5 strength was present in all four extremities.  Sensory: Sensation is symmetric to light touch and temperature in the arms and legs. Deep Tendon Reflexes: 2+ and symmetric in the biceps and patellae.  Plantars: Toes are downgoing bilaterally.  Cerebellar: FNF and HKS are intact bilaterally Gait: Patient has a stable casual gait. Able to heel and toe walk, but some difficulty with tandem.   I have reviewed labs in epic and the results pertinent to this consultation are: BMP - unremarkable  Impression: 56 yo M with mild hypoxic encephalopathy that is rapidly improving. His deficits are mainly with attention and impulse control which are predominantly frontal findings. I suspect that he has had some injury, but with the rapid improvement he has displayed, I expect him to continue to improve. I do not feel that imaging would significantly alter management at this time, though if he continues to have problems down the line, it may be reasonable in addition to psychometric testing for a more formal assessment  Recommendations: 1) Could consider MRI and psychometric testing if deficits persist. 2) Agree with SLP therapy as outpatient. 3) Neurology will sign off at this time, please call if any further questions remain.    Ritta Slot, MD [facility] [phone]  If 7pm- 7am, please page neurology on call at [phone].

## 2012-06-26 NOTE — Progress Notes (Signed)
Spoke to Dr. Katrinka Blazing regarding need for speech cognitive evaluation; order received. [name] Walker

## 2012-06-26 NOTE — Evaluation (Signed)
Physical Therapy Evaluation Patient Details Name: [name] Walker MRN: [id number] DOB: [birth year] Today's Date: 06/26/2012 Time: 8119-1478 PT Time Calculation (min): 44 min  PT Assessment / Plan / Recommendation Clinical Impression  pt rpesents post STEMI with CPR.  pt most limited by cognitive deficits and pt seems to have very little awareness of deficits.  pt will need OPPT for high level balance tasks, but will also need OPOT and Speech for cognition.  pt will require 24/7 S at home for safety.      PT Assessment  Patient needs continued PT services    Follow Up Recommendations  Outpatient PT;Supervision/Assistance - 24 hour    Does the patient have the potential to tolerate intense rehabilitation      Barriers to Discharge None      Equipment Recommendations  Rolling walker with 5" wheels    Recommendations for Other Services Speech consult   Frequency Min 3X/week    Precautions / Restrictions Precautions Precautions: Fall Restrictions Weight Bearing Restrictions: No   Pertinent Vitals/Pain Denies pain.        Mobility  Bed Mobility Bed Mobility: Supine to Sit;Sitting - Scoot to Edge of Bed;Sit to Supine Supine to Sit: 7: Independent Sitting - Scoot to Edge of Bed: 7: Independent Sit to Supine: 7: Independent Transfers Transfers: Sit to Stand;Stand to Sit Sit to Stand: 5: Supervision;From bed Stand to Sit: 5: Supervision;To bed Details for Transfer Assistance: Pt is impulsive Ambulation/Gait Ambulation/Gait Assistance: 5: Supervision;4: Min guard Ambulation Distance (Feet): 500 Feet Assistive device: Rolling walker Ambulation/Gait Assistance Details: pt able to ambulate with S in controlled environment, but when incorperating cognitive tasks or external distractions/stimuli becomes unsteady and has moderate bil sway requiring MinG.  When incorperating head turns and negotiating obstacles pt requires MinG.   Gait Pattern: Step-through pattern;Decreased  stride length;Lateral trunk lean to right;Lateral trunk lean to left Stairs: No Wheelchair Mobility Wheelchair Mobility: No    Shoulder Instructions     Exercises     PT Diagnosis: Difficulty walking  PT Problem List: Decreased activity tolerance;Decreased balance;Decreased mobility;Decreased cognition;Decreased knowledge of use of DME;Decreased safety awareness PT Treatment Interventions: DME instruction;Gait training;Stair training;Functional mobility training;Therapeutic activities;Therapeutic exercise;Balance training;Cognitive remediation;Patient/family education   PT Goals Acute Rehab PT Goals PT Goal Formulation: With patient Time For Goal Achievement: 07/10/12 Potential to Achieve Goals: Good Pt will go Sit to Stand: with modified independence PT Goal: Sit to Stand - Progress: Goal set today Pt will go Stand to Sit: with modified independence PT Goal: Stand to Sit - Progress: Goal set today Pt will Ambulate: >150 feet;with modified independence;with least restrictive assistive device PT Goal: Ambulate - Progress: Goal set today Pt will Go Up / Down Stairs: Flight;with supervision;with rail(s) PT Goal: Up/Down Stairs - Progress: Goal set today  Visit Information  Last PT Received On: 06/26/12 Assistance Needed: +1 PT/OT Co-Evaluation/Treatment: Yes    Subjective Data  Subjective: I'm wearing pants. - pt not wearing pants.   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 states he had a stroke.  Is aware he had CPR) Behavior During Session: Other (comment) (disinhibited) Current Attention Level: Other (comment);Selective Attention - Other Comments: min - mod cues in moderately distractive environment Memory Deficits: Unable to recall staff that has worked with him consistently, despited cues Following Commands: Follows multi-step commands inconsistently (self distracts) Safety/Judgement: Impulsive Awareness of Errors: Assistance required to identify errors made;Assistance required to correct errors made Awareness of Errors - Other Comments: Pt. makes multiple errors during basic map reading task, but has no awareness of errors, even when they are pointed out.   Awareness of Deficits: Pt acknowledges physical deficits, but not cognitive deficits.  He does agree to OP OT for cognitive deficts and does agree that his memory many not be "up to par" Problem Solving: max A for novel tasks Executive Functioning: impaired    Extremity/Trunk Assessment Right Upper Extremity Assessment RUE ROM/Strength/Tone: Within functional levels RUE Coordination: WFL - gross/fine motor Left Upper Extremity Assessment LUE ROM/Strength/Tone: Within functional levels LUE Coordination: WFL - gross/fine motor Right Lower Extremity Assessment RLE ROM/Strength/Tone: WFL for tasks assessed RLE Sensation: WFL - Light Touch Left Lower Extremity Assessment LLE ROM/Strength/Tone: WFL for tasks assessed LLE Sensation: WFL - Light Touch Trunk  Assessment Trunk Assessment: Normal   Balance Balance Balance Assessed: Yes Dynamic Standing Balance Dynamic Standing - Balance Support: No upper extremity supported Dynamic Standing - Level of Assistance: 5: Stand by assistance Dynamic Standing - Balance Activities: Other (comment) (Grooming standing at sink) High Level Balance High Level Balance Activites: Direction changes;Turns;Head turns  End of Session PT - End of Session Equipment Utilized During Treatment: Gait belt Activity Tolerance: Patient tolerated treatment well Patient left: in chair;with call bell/phone within reach Nurse Communication: Mobility status  GP     Sunny Schlein, [place] [phone] 06/26/2012, 3:43 PM

## 2012-06-26 NOTE — Progress Notes (Addendum)
Patient Name: [name] Walker Date of Encounter: 06/26/2012    SUBJECTIVE: The patient is awake and has been ambulating in the hall with assistance. I spoke with occupational and physical therapy both of whom feel that he has significant higher level cognitive impairment. S/P with the patient he seems to have reasonable recall of prior history. He denies chest pain and dyspnea.  TELEMETRY:  Normal sinus rhythm.: Filed Vitals:   06/26/12 0525 06/26/12 0846 06/26/12 1043 06/26/12 1353  BP: 105/76  107/73 99/62  Pulse: 78  89 71  Temp: 98.8 F (37.1 C)   98.5 F (36.9 C)  TempSrc: Oral   Oral  Resp: 18   18  Height:      Weight:      SpO2: 92% 96%  92%    Intake/Output Summary (Last 24 hours) at 06/26/12 1414 Last data filed at 06/26/12 1300  Gross per 24 hour  Intake    920 ml  Output    400 ml  Net    520 ml    LABS: Basic Metabolic Panel:  Basename 06/26/12 0500 06/25/12 0652 06/24/12 0530  NA 140 142 --  K 3.8 4.0 --  CL 102 103 --  CO2 25 25 --  GLUCOSE 92 100* --  BUN 25* 23 --  CREATININE 0.78 0.78 --  CALCIUM 9.3 9.4 --  MG 1.9 -- 1.9  PHOS 3.4 -- 3.6   CBC:  Basename 06/26/12 0500 06/25/12 0652  WBC 11.8* 9.9  NEUTROABS -- --  HGB 13.2 12.2*  HCT 38.4* 36.1*  MCV 93.2 92.1  PLT 177 159   Radiology/Studies:  No recent repeat chest x-ray since 06/23/12  Physical Exam: Blood pressure 99/62, pulse 71, temperature 98.5 F (36.9 C), temperature source Oral, resp. rate 18, height 5\' 9"  (1.753 m), weight 93.8 kg (206 lb 12.7 oz), SpO2 92.00%. Weight change:    S4 gallop. No murmur or rub.  Moderate JVD.  Chest reveals decreased breath sounds at the bases. No wheezing. Faint rales are heard in the mid lung field.  Neurological exam reveals no obvious focal deficit . He does seem to have some difficulty with word finding. He is alert and oriented x3.  ASSESSMENT:  1. Status post large anterior myocardial infarction with ventricular fibrillation cardiac  arrest successfully resuscitated and placed on the hypothermia protocol.  2. Total occlusion of the proximal left anterior descending treated with bare-metal stent on the day of presentation.  3. Cardiac arrest and status post hypothermia protocol  4. Neurological injury with persisting cognitive deficits including attention, memory, problem solving, and judgment.  5. Acute systolic heart failure do to acute anterior myocardial infarction  6. Coumadin anticoagulation for severe LV dysfunction following anterior MI. Coumadin is being used to prevent apical thrombus echo cause embolic stroke  Plan:  1. Discharge planning to include neuro evaluation, outpatient OT, outpatient PT, outpatient speech and language therapy,  2. Continue beta blocker therapy and consider adding low-dose ACE inhibitor therapy or spironolactone due to LV systolic dysfunction.  3. He needs formal neurology consult for direction with reference to neuro recovery. [name] 06/26/2012, 2:14 PM

## 2012-06-26 NOTE — Progress Notes (Signed)
Occupational Therapy Evaluation Patient Details Name: [name] Walker MRN: [id number] DOB: Oct 31, 1955 Today's Date: 06/26/2012 Time: 4098-1191 OT Time Calculation (min): 44 min  OT Assessment / Plan / Recommendation Clinical Impression  56 y/o male smoker with no known past medical history presented on 06/19/2012 to the [facility] ED in cardiac arrest in the setting of AW- STEMI.  Pt. intubated 12/20 - 06/23/12.  Pt presents to OT with significant cognitive deficits including deficits with attention, memory, problem solving, safety and judgement.  Pt. is impulsive and disinhibited with no awareness of deficits.  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  He will benefit from OT for cognition and balance to allow pt to return home at independent level with BADLs, and to allow pt. to achieve highest level of functioning with IADLs.  Recommend OP OT, SLP, and neuropsych at discharge. Also recommend no driving until cleared by MD or OT (pt with poor attention resulting in impaired visual scanning - not anticipating obstacles when ambulating, etc)    OT Assessment  Patient needs continued OT Services    Follow Up Recommendations  Outpatient OT PT/SLP and Neuro psych evaluation;   Supervision/Assistance - 24 hour  Barriers to Discharge None    Equipment Recommendations  None recommended by OT    Recommendations for Other Services Speech consult  Frequency  Min 2X/week    Precautions / Restrictions Precautions Precautions: Fall Restrictions Weight Bearing Restrictions: No   Pertinent Vitals/Pain     ADL  Eating/Feeding: Independent Where Assessed - Eating/Feeding: Edge of bed Grooming: Wash/dry hands;Wash/dry face;Teeth care;Brushing hair;Supervision/safety (Decreased thoroughness) Where Assessed - Grooming: Unsupported standing Upper Body Bathing:  Supervision/safety Where Assessed - Upper Body Bathing: Unsupported sitting Lower Body Bathing: Supervision/safety Where Assessed - Lower Body Bathing: Unsupported sit to stand Upper Body Dressing: Supervision/safety Where Assessed - Upper Body Dressing: Unsupported sitting Lower Body Dressing: Supervision/safety Where Assessed - Lower Body Dressing: Unsupported sit to stand Toilet Transfer: Min Pension scheme manager Method: Sit to Barista: Regular height toilet Toileting - Clothing Manipulation and Hygiene: Supervision/safety Where Assessed - Engineer, mining and Hygiene: Standing Equipment Used: Rolling walker Transfers/Ambulation Related to ADLs: pt. ambulates with min guard A with RW ADL Comments: Pt. requires supervision for attentional deficits/thoroughnes and min guard assist for balance    OT Diagnosis: Cognitive deficits;Generalized weakness  OT Problem List: Impaired balance (sitting and/or standing);Decreased cognition;Decreased safety awareness OT Treatment Interventions: Self-care/ADL training;Therapeutic activities;Cognitive remediation/compensation;Patient/family education;Balance training   OT Goals Acute Rehab OT Goals OT Goal Formulation: With patient Time For Goal Achievement: 07/03/12 Potential to Achieve Goals: Good ADL Goals Pt Will Perform Grooming: Independently;Standing at sink ADL Goal: Grooming - Progress: Goal set today Pt Will Perform Upper Body Bathing: Independently;Sitting at sink ADL Goal: Upper Body Bathing - Progress: Goal set today Pt Will Perform Lower Body Bathing: Independently;Standing at sink ADL Goal: Lower Body Bathing - Progress: Goal set today Pt Will Perform Upper Body Dressing: Independently;Sitting, chair;Sitting, bed ADL Goal: Upper Body Dressing - Progress: Goal set today Pt Will Perform Lower Body Dressing: Independently;Sit to stand from chair;Sit to stand from bed ADL Goal: Lower Body  Dressing - Progress: Goal set today Pt Will Transfer to Toilet: Independently;Ambulation;Comfort height toilet;Regular height toilet ADL Goal: Toilet Transfer - Progress: Goal set today Pt Will Perform Toileting - Clothing Manipulation: Independently;Standing ADL Goal:  Toileting - Clothing Manipulation - Progress: Goal set today Pt Will Perform Toileting - Hygiene: Independently;Standing at 3-in-1/toilet ADL Goal: Toileting - Hygiene - Progress: Goal set today Pt Will Perform Tub/Shower Transfer: Shower transfer;Independently;Ambulation ADL Goal: Tub/Shower Transfer - Progress: Goal set today Additional ADL Goal #1: Pt. will initiate am ADLs with no more than one verbal cue ADL Goal: Additional Goal #1 - Progress: Goal set today Additional ADL Goal #2: Pt. 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  Vision not formally assesed, but  appears to have a disorganized scan path during functional tasks, missing objects, or not anticipating them in a timely manner - anticipate this is a component of generalized attentional deficits Praxis Praxis: Intact   Cognition  Overall Cognitive Status: Impaired Area of Impairment: Attention;Memory;Following commands;Safety/judgement;Awareness of errors;Awareness of deficits;Problem solving;Executive functioning Arousal/Alertness: Awake/alert Orientation Level: Disoriented to;Situation (Pt. states he had a stroke.  Is aware he had CPR) Behavior During Session: Other (comment) (disinhibited) Current Attention Level: Other (comment);Selective Attention - Other Comments: min - mod cues in moderately distractive environment Memory Deficits: Unable to recall staff that has worked with him consistently, despited cues Following Commands: Follows multi-step commands inconsistently (self distracts) Safety/Judgement: Impulsive Awareness of Errors: Assistance required to identify errors made;Assistance required to correct errors made Awareness of Errors - Other Comments: Pt. makes multiple errors during basic map reading task, but has no awareness of errors, even when they are pointed out.   Awareness of Deficits: Pt acknowledges physical deficits, but not cognitive deficits.  He does agree to OP OT for cognitive deficts and does agree that his memory many not be "up to par" Problem Solving: max A for novel tasks Executive Functioning: impaired    Extremity/Trunk Assessment Right Upper Extremity Assessment RUE ROM/Strength/Tone: Within functional levels RUE Coordination: WFL - gross/fine motor Left Upper Extremity Assessment LUE ROM/Strength/Tone: Within functional levels LUE Coordination: WFL - gross/fine motor Trunk Assessment Trunk Assessment: Normal     Mobility Bed Mobility Bed Mobility: Supine to Sit;Sitting - Scoot to Edge of Bed;Sit to Supine Supine to Sit: 7: Independent Sitting -  Scoot to Edge of Bed: 7: Independent Sit to Supine: 7: Independent Transfers Transfers: Sit to Stand;Stand to Sit Sit to Stand: 5: Supervision;From bed Stand to Sit: 5: Supervision;To bed Details for Transfer Assistance: Pt is impulsive     Shoulder Instructions     Exercise     Balance Balance Balance Assessed: Yes Dynamic Standing Balance Dynamic Standing - Balance Support: No  upper extremity supported Dynamic Standing - Level of Assistance: 5: Stand by assistance Dynamic Standing - Balance Activities: Other (comment) (Grooming standing at sink)   End of Session OT - End of Session Activity Tolerance: Patient tolerated treatment well Patient left: in bed;with bed alarm set Nurse Communication: Mobility status;Other (comment) (cognitive deficits and need for SLP for cognition)  GO     Blease Capaldi, Ursula Alert M 06/26/2012, 1:08 PM

## 2012-06-26 NOTE — Progress Notes (Signed)
Patient ambulating with PT. Appears to be less impulsive today. Call bell near. Instructed to notify RN of needs to get out of bed; verbalized understanding. Bed alarm in place. [name] Walker

## 2012-06-26 NOTE — Progress Notes (Signed)
CARDIAC REHAB PHASE I   PRE:  Rate/Rhythm: 84 SR  BP:  Supine: 90/62  Sitting:   Standing:    SaO2: 93 RA  MODE:  Ambulation: 890 ft   POST:  Rate/Rhythem: 100  BP:  Supine:   Sitting: 90/60  Standing:    SaO2: 98 RA 1500-1540 Assisted X 1 and used walker to ambulate. Hard to get pt to concentrate on task and to get him started. Pt more steady today, but get distracted in hall looking in in rooms, speaking to everyone in the halls. Pt got walker up on one side of wheels twice and he had trouble steering walker straight. Pt had no c/o with walking.Pt back to bed after walk with girlfriend present and bed alarm on.  Beatrix Fetters

## 2012-06-27 ENCOUNTER — Inpatient Hospital Stay (HOSPITAL_COMMUNITY): Payer: BC Managed Care – PPO

## 2012-06-27 DIAGNOSIS — I5022 Chronic systolic (congestive) heart failure: Secondary | ICD-10-CM | POA: Diagnosis present

## 2012-06-27 LAB — CULTURE, BLOOD (ROUTINE X 2)
Culture: NO GROWTH
Culture: NO GROWTH

## 2012-06-27 LAB — PROTIME-INR
INR: 2.26 — ABNORMAL HIGH (ref 0.00–1.49)
Prothrombin Time: 24 seconds — ABNORMAL HIGH (ref 11.6–15.2)

## 2012-06-27 MED ORDER — ALBUTEROL SULFATE (5 MG/ML) 0.5% IN NEBU
2.5000 mg | INHALATION_SOLUTION | Freq: Three times a day (TID) | RESPIRATORY_TRACT | Status: DC
  Administered 2012-06-27 (×3): 2.5 mg via RESPIRATORY_TRACT
  Filled 2012-06-27 (×3): qty 0.5

## 2012-06-27 MED ORDER — IPRATROPIUM BROMIDE 0.02 % IN SOLN: 0.5000 mg | RESPIRATORY_TRACT | Status: DC | PRN

## 2012-06-27 MED ORDER — METOPROLOL TARTRATE 12.5 MG HALF TABLET: 12.5000 mg | ORAL_TABLET | Freq: Two times a day (BID) | ORAL | Status: DC

## 2012-06-27 MED ORDER — BUPROPION HCL ER (XL) 150 MG PO TB24: 150.0000 mg | ORAL_TABLET | Freq: Every day | ORAL | Status: DC

## 2012-06-27 MED ORDER — BUPROPION HCL ER (XL) 150 MG PO TB24
150.0000 mg | ORAL_TABLET | Freq: Every day | ORAL | Status: DC
  Administered 2012-06-27 – 2012-06-28 (×2): 150 mg via ORAL
  Filled 2012-06-27 (×2): qty 1

## 2012-06-27 MED ORDER — ALBUTEROL SULFATE (5 MG/ML) 0.5% IN NEBU: 2.5000 mg | INHALATION_SOLUTION | RESPIRATORY_TRACT | Status: DC | PRN

## 2012-06-27 MED ORDER — ATORVASTATIN CALCIUM 20 MG PO TABS: 20.0000 mg | ORAL_TABLET | Freq: Every day | ORAL | Status: DC

## 2012-06-27 MED ORDER — ATORVASTATIN CALCIUM 20 MG PO TABS
20.0000 mg | ORAL_TABLET | Freq: Every day | ORAL | Status: DC
  Filled 2012-06-27 (×2): qty 1

## 2012-06-27 MED ORDER — SPIRONOLACTONE 25 MG PO TABS
25.0000 mg | ORAL_TABLET | Freq: Every day | ORAL | Status: DC
  Administered 2012-06-28: 25 mg via ORAL
  Filled 2012-06-27: qty 1

## 2012-06-27 MED ORDER — SPIRONOLACTONE 25 MG PO TABS: 25.0000 mg | ORAL_TABLET | Freq: Every day | ORAL | Status: DC

## 2012-06-27 MED ORDER — LEVOFLOXACIN 500 MG PO TABS: 500.0000 mg | ORAL_TABLET | Freq: Every day | ORAL | Status: AC

## 2012-06-27 MED ORDER — WARFARIN SODIUM 5 MG PO TABS
5.0000 mg | ORAL_TABLET | Freq: Once | ORAL | Status: DC
  Filled 2012-06-27: qty 1

## 2012-06-27 MED ORDER — WARFARIN SODIUM 5 MG PO TABS: 2.5000 mg | ORAL_TABLET | Freq: Every day | ORAL | Status: DC

## 2012-06-27 MED ORDER — CLOPIDOGREL BISULFATE 75 MG PO TABS: 75.0000 mg | ORAL_TABLET | Freq: Every day | ORAL | Status: DC

## 2012-06-27 MED ORDER — ASPIRIN 81 MG PO CHEW
81.0000 mg | CHEWABLE_TABLET | Freq: Every day | ORAL | Status: DC
  Administered 2012-06-28: 81 mg via ORAL
  Filled 2012-06-27: qty 1

## 2012-06-27 MED ORDER — IPRATROPIUM BROMIDE 0.02 % IN SOLN
0.5000 mg | Freq: Three times a day (TID) | RESPIRATORY_TRACT | Status: DC
  Administered 2012-06-27 (×3): 0.5 mg via RESPIRATORY_TRACT
  Filled 2012-06-27 (×3): qty 2.5

## 2012-06-27 NOTE — Discharge Summary (Signed)
Patient ID: [name] Walker MRN: [id number] DOB/AGE: [birth year] 56 y.o.  Admit date: 06/19/2012 Discharge date: 06/28/2012  Primary Discharge Diagnosis: Acute anterior ST elevation MI  Secondary Discharge Diagnosis: Cardiac arrest due to VF Cardiogenic shock Acute anoxic encephalopathy Acute systolic heart failure Tobacco abuse  Significant Diagnostic Studies: Emergency catheterization with stenting and IABP insertion.  Hypothermia protocol x 48 hrs.  Consults: Critical Care Medicine Physical therapy Occupational and Speech therapy Case Management Neurology Consult  Hospital Course:  The patient suffered a large anterior MI on the morning of admission that resulted in VF cardiac arrest. He had successful resuscitation following 30 minutes of CPR. He underwent successful PCI with BMS placed in the proximal LAD. He had pressors and IABP placed to support him through cardiogenic shock. Immediately after PCI he was started on the hypothermia protocol. He was successfully extubated several days later and made a remarkable physical and neurological recovery.At discharge he is ambulatory and according to neurology has mild frontal lobe deficits that appear to be recovering quickly.  The INR on the morning of discharge is 2.08. He vwill f/u in our coumadin clinic on 06/30/2012. We will further titrate the HF therapy as an OP. Unable to add ACE now due to BP.   Discharge Exam: Blood pressure 98/70, pulse 71, temperature 98.6 F (37 C), temperature source Oral, resp. rate 18, height 5\' 9"  (1.753 m), weight 93.8 kg (206 lb 12.7 oz), SpO2 93.00%.   S4 gallop  Faint basilar rales.  Neuro is stable and improving daily.  Labs:   Lab Results  Component Value Date   WBC 11.8* 06/26/2012   HGB 13.2 06/26/2012   HCT 38.4* 06/26/2012   MCV 93.2 06/26/2012   PLT 177 06/26/2012     Lab 06/28/12 0450  NA 139  K 4.0  CL 104  CO2 22  BUN 28*  CREATININE 0.82  CALCIUM 9.3  PROT --    BILITOT --  ALKPHOS --  ALT --  AST --  GLUCOSE 101*   Lab Results  Component Value Date   CKTOTAL 416* 06/19/2012   CKMB 32.3* 06/19/2012   TROPONINI 3.15* 06/19/2012   Radiology: *RADIOLOGY REPORT*  Clinical Data: CHF.  CHEST - 2 VIEW  Comparison: 06/23/2012  Findings: Interval removal of NG tube, endotracheal tube and left  central line. Improving aeration of the lungs with decreasing  lower lobe airspace opacities. Heart is normal size. No  effusions. No acute bony abnormality.  IMPRESSION:  Improving bibasilar aeration and airspace opacities.  Original Report Authenticated By: Charlett Nose, M.D.  ECHOCARDIOGRAM: 06/27/2012  Study Conclusions  - Left ventricle: The cavity size was normal. There was mild concentric hypertrophy. Systolic function was moderately to severely reduced. The estimated ejection fraction was in the range of 30% to 35%. There is akinesis of the entireanteroseptal myocardium. There is akinesis of the entireapical myocardium. There was an increased relative contribution of atrial contraction to ventricular filling. Cannot exclude apical thrombus. - Atrial septum: No defect or patent foramen ovale was identified.   EKG:NSR with evidence anterior MI on 06/20/12.  FOLLOW UP PLANS AND APPOINTMENTS     Discharge Orders    Future Orders Please Complete By Expires   Amb Referral to Cardiac Rehabilitation          Medication List     As of 06/28/2012 12:26 PM    STOP taking these medications         dextromethorphan-guaiFENesin 30-600 MG per 12 hr  tablet   Commonly known as: MUCINEX DM      NO DOZ MAXIMUM STRENGTH 200 MG Tabs   Generic drug: caffeine      sildenafil 100 MG tablet   Commonly known as: VIAGRA      TAKE these medications         aspirin 81 MG chewable tablet   Chew 1 tablet (81 mg total) by mouth daily.      atorvastatin 20 MG tablet   Commonly known as: LIPITOR   Take 1 tablet (20 mg total) by mouth daily at 6  PM.      buPROPion 150 MG 24 hr tablet   Commonly known as: WELLBUTRIN XL   Take 1 tablet (150 mg total) by mouth daily.      clopidogrel 75 MG tablet   Commonly known as: PLAVIX   Take 1 tablet (75 mg total) by mouth daily with breakfast.      levofloxacin 500 MG tablet   Commonly known as: LEVAQUIN   Take 1 tablet (500 mg total) by mouth daily.      metoprolol tartrate 12.5 mg Tabs   Commonly known as: LOPRESSOR   Take 0.5 tablets (12.5 mg total) by mouth 2 (two) times daily.      spironolactone 12.5 mg Tabs   Commonly known as: ALDACTONE   Take 0.5 tablets (12.5 mg total) by mouth daily.      warfarin 5 MG tablet   Commonly known as: COUMADIN   Take 0.5 tablets (2.5 mg total) by mouth daily.        Follow-up Information    Follow up with Lesleigh Noe, MD. (Office will call with appointment for the coumadin clinic and to see Dr. Katrinka Blazing  )    Contact information:   [address] [place] [address] [phone]          BRING ALL MEDICATIONS WITH YOU TO FOLLOW UP APPOINTMENTS  Time spent with patient to include physician time: 30 min.  SignedLesleigh Noe 06/28/2012, 12:26 PM

## 2012-06-27 NOTE — Progress Notes (Signed)
CARDIAC REHAB PHASE I   PRE:  Rate/Rhythm: 90 SR    BP: sitting 90/70    SaO2: 93 RA  MODE:  Ambulation: 890 ft   POST:  Rate/Rhythm: 109 ST    BP: sitting 80/70     SaO2: 97 RA  Pt improving each day. Walked long walk with supervision. Gait sways but pt able to compensate. No loss of balance. Last leg of walk d/c'd RW and pt able to be independent without LOB. Did have near miss of walking into a chair when looking into other pts room. Pt able to converse and remember things. BP low after walk, denied sx. Took in both arms manually.  Pt needs education with girlfriend present. Will f/u.  [id number]  Harriet Masson CES, ACSM

## 2012-06-27 NOTE — Progress Notes (Signed)
  Echocardiogram 2D Echocardiogram with Definity has been performed.  Alpheus Stiff FRANCES 06/27/2012, 5:29 PM

## 2012-06-27 NOTE — Progress Notes (Addendum)
Patient Name: [name] Walker Date of Encounter: 06/27/2012    SUBJECTIVE:Wants badly to go home. Seems appropriate. Unable to sleep in hospital due to recurring interruptions.  TELEMETRY:  NSR Filed Vitals:   06/26/12 1720 06/26/12 2038 06/27/12 0500 06/27/12 0820  BP: 100/69 106/70 105/70   Pulse: 87 83 75   Temp:  97.4 F (36.3 C) 99.1 F (37.3 C)   TempSrc:  Oral Oral   Resp:  20 18   Height:      Weight:      SpO2:  93% 94% 95%    Intake/Output Summary (Last 24 hours) at 06/27/12 1315 Last data filed at 06/27/12 0503  Gross per 24 hour  Intake      0 ml  Output    200 ml  Net   -200 ml    LABS: Basic Metabolic Panel:  Basename 06/26/12 1522 06/26/12 0500  NA 139 140  K 4.1 3.8  CL 101 102  CO2 25 25  GLUCOSE 97 92  BUN 26* 25*  CREATININE 0.81 0.78  CALCIUM 9.6 9.3  MG -- 1.9  PHOS -- 3.4   CBC:  Basename 06/26/12 0500 06/25/12 0652  WBC 11.8* 9.9  NEUTROABS -- --  HGB 13.2 12.2*  HCT 38.4* 36.1*  MCV 93.2 92.1  PLT 177 159   Radiology/Studies:  CXR: bibasilar airspace disease, but improving  Physical Exam: Blood pressure 105/70, pulse 75, temperature 99.1 F (37.3 C), temperature source Oral, resp. rate 18, height 5\' 9"  (1.753 m), weight 93.8 kg (206 lb 12.7 oz), SpO2 95.00%. Weight change:    No murmur or rub.  Neck veins flat.  Abdomen is soft.  Neuro is stable. In bed with his coat on.  ASSESSMENT:  1. Chest x-ray shows persisting lower lobe airspace disease.  2. CAD with recent anterior STEMI treated successfully with BMS  3. Anoxic brain injury due to cardiac arrest in setting of MI. Improving functional status with no major concerns from neurology consultant.  4. Acute systolic heart failure is improving  Plan:  1. Home tomorrow with OP: OT, PT, ST, and neuro-psych  2. Beta blocker, aldosterone blocker, statin, asa and plavix. 3. Coumadin added due to low EF in setting of anterior MI and risk of apical thrombus. 4. Check  kidney function on spironolactone 5. Start statin 6. Bupropion for smoking. [name] Walker 06/27/2012, 1:15 PM

## 2012-06-27 NOTE — Progress Notes (Signed)
Attempted ed with pt and girlfriend however due to time restraints and pt needing echo, did not finish. Pt and girlfriend seem to have good understanding of MI, stent, smoking cessation and CRPII. Gave sheets for diet and ex. Did not have time to discuss NTG, asked RN to do this as we do not work on Sunday. Interested in CRPII and will send referral to [place]. Wants to quit smoking, gave resources.  [phone] Ethelda Chick CES, ACSM

## 2012-06-27 NOTE — Progress Notes (Signed)
NUTRITION FOLLOW UP  Intervention:    No nutrition intervention warranted at this time RD to follow for nutrition care plan  Nutrition Dx:   Inadequate oral intake, resolved  New Goal:   Oral intake with meals to meet >/= 90% of estimated nutrition needs, met  Monitor:   PO intake, weight, labs, I/O's  Assessment:   Patient extubated 12/24.  S/p bedside swallow evaluation 12/26.  PO intake 75-100% per flowsheet records.  Height: Ht Readings from Last 1 Encounters:  06/19/12 5\' 9"  (1.753 m)    Weight Status:   Wt Readings from Last 1 Encounters:  06/23/12 206 lb 12.7 oz (93.8 kg)    Re-estimated needs:  Kcal: 2100-2300 Protein: 110-120 gm Fluid: 2.1-2.3 L  Skin: Intact  Diet Order: General   Intake/Output Summary (Last 24 hours) at 06/27/12 1000 Last data filed at 06/27/12 0503  Gross per 24 hour  Intake    240 ml  Output    200 ml  Net     40 ml    Labs:   Lab 06/26/12 1522 06/26/12 0500 06/25/12 0652 06/24/12 0530 06/23/12 0440  NA 139 140 142 -- --  K 4.1 3.8 4.0 -- --  CL 101 102 103 -- --  CO2 25 25 25  -- --  BUN 26* 25* 23 -- --  CREATININE 0.81 0.78 0.78 -- --  CALCIUM 9.6 9.3 9.4 -- --  MG -- 1.9 -- 1.9 1.7  PHOS -- 3.4 -- 3.6 1.8*  GLUCOSE 97 92 100* -- --    Scheduled Meds:   . albuterol  2.5 mg Nebulization TID  . aspirin  325 mg Oral Daily  . clopidogrel  75 mg Oral Q breakfast  . heparin subcutaneous  5,000 Units Subcutaneous Q8H  . ipratropium  0.5 mg Nebulization TID  . levofloxacin  500 mg Oral Daily  . metoprolol tartrate  12.5 mg Oral BID  . multivitamin with minerals  1 tablet Oral Daily  . spironolactone  25 mg Oral BID  . Warfarin - Pharmacist Dosing Inpatient   Does not apply q1800    Continuous Infusions:   Kirkland Hun, RD, LDN Pager #: [phone] After-Hours Pager #: [phone]

## 2012-06-27 NOTE — Progress Notes (Signed)
ANTICOAGULATION CONSULT NOTE - Follow Up Consult  Pharmacy Consult for Coumadin Indication: increased risk of developing LV thrombus  No Known Allergies  Patient Measurements: Height: 5\' 9"  (175.3 cm) Weight: 206 lb 12.7 oz (93.8 kg) IBW/kg (Calculated) : 70.7  Heparin Dosing Weight:   Vital Signs: Temp: 99.1 F (37.3 C) (12/28 0500) Temp src: Oral (12/28 0500) BP: 105/70 mmHg (12/28 0500) Pulse Rate: 75  (12/28 0500)  Labs:  Basename 06/27/12 0442 06/26/12 1522 06/26/12 0500 06/25/12 0652  HGB -- -- 13.2 12.2*  HCT -- -- 38.4* 36.1*  PLT -- -- 177 159  APTT -- -- -- --  LABPROT 24.0* -- 14.5 --  INR 2.26* -- 1.15 --  HEPARINUNFRC -- -- -- --  CREATININE -- 0.81 0.78 0.78  CKTOTAL -- -- -- --  CKMB -- -- -- --  TROPONINI -- -- -- --    Estimated Creatinine Clearance: 116.5 ml/min (by C-G formula based on Cr of 0.81).   Medications:  Scheduled:    . albuterol  2.5 mg Nebulization TID  . aspirin  325 mg Oral Daily  . clopidogrel  75 mg Oral Q breakfast  . heparin subcutaneous  5,000 Units Subcutaneous Q8H  . ipratropium  0.5 mg Nebulization TID  . levofloxacin  500 mg Oral Daily  . metoprolol tartrate  12.5 mg Oral BID  . multivitamin with minerals  1 tablet Oral Daily  . spironolactone  25 mg Oral BID  . [COMPLETED] warfarin  7.5 mg Oral ONCE-1800  . Warfarin - Pharmacist Dosing Inpatient   Does not apply q1800  . [DISCONTINUED] albuterol  2.5 mg Nebulization Q6H WA  . [DISCONTINUED] furosemide  20 mg Oral Daily  . [DISCONTINUED] ipratropium  0.5 mg Nebulization Q6H WA  . [DISCONTINUED] potassium chloride  20 mEq Oral Daily    Assessment: Pt is improving. Doing well in PT. INR is 2.26 today.  Goal of Therapy:  INR 2-3 Monitor platelets by anticoagulation protocol: Yes   Plan:  Coumadin 5 mg today F/U INR in AM  Eugene Garnet 06/27/2012,1:02 PM

## 2012-06-27 NOTE — Progress Notes (Signed)
Occupational Therapy Treatment Patient Details Name: [name] Walker MRN: [id number] DOB: 08/08/1955 Today's Date: 06/27/2012 Time: 6213-0865 OT Time Calculation (min): 24 min  OT Assessment / Plan / Recommendation Comments on Treatment Session Pt progressing well. Cognition markedly improved today compared to notes from yesterday, but pt continues to display complex problem solving/high level cognitive deficits and will benefit from OP SLP at d/c. Will continue to follow acutely    Follow Up Recommendations  Outpatient OT;Supervision/Assistance - 24 hour    Barriers to Discharge       Equipment Recommendations  None recommended by OT    Recommendations for Other Services    Frequency Min 2X/week   Plan Discharge plan remains appropriate    Precautions / Restrictions Precautions Precautions: Fall   Pertinent Vitals/Pain Pt with no c/o pain    ADL  Toilet Transfer: Supervision/safety Toilet Transfer Method: Sit to stand Toilet Transfer Equipment: Regular height toilet Toileting - Clothing Manipulation and Hygiene: Supervision/safety Where Assessed - Engineer, mining and Hygiene: Standing Tub/Shower Transfer: Supervision/safety Tub/Shower Transfer Method: Science writer: Walk in shower;Grab bars Equipment Used: Gait belt Transfers/Ambulation Related to ADLs: min guard A ambulation without AD; did not run into any objects. ADL Comments: Pt attention appears improved today compared to note from yesterday. Continue to recommend high level cognitive OP therapy    OT Diagnosis:    OT Problem List:   OT Treatment Interventions:     OT Goals Acute Rehab OT Goals Time For Goal Achievement: 07/03/12 ADL Goals Pt Will Perform Grooming: Independently;Standing at sink ADL Goal: Grooming - Progress: Progressing toward goals Pt Will Perform Upper Body Bathing: Independently;Sitting at sink Pt Will Perform Lower Body Bathing:  Independently;Standing at sink Pt Will Perform Upper Body Dressing: Independently;Sitting, chair;Sitting, bed Pt Will Perform Lower Body Dressing: Independently;Sit to stand from chair;Sit to stand from bed ADL Goal: Lower Body Dressing - Progress: Progressing toward goals Pt Will Transfer to Toilet: Independently;Ambulation;Comfort height toilet;Regular height toilet ADL Goal: Toilet Transfer - Progress: Progressing toward goals Pt Will Perform Toileting - Clothing Manipulation: Independently;Standing ADL Goal: Toileting - Clothing Manipulation - Progress: Progressing toward goals Pt Will Perform Toileting - Hygiene: Independently;Standing at 3-in-1/toilet ADL Goal: Toileting - Hygiene - Progress: Progressing toward goals Pt Will Perform Tub/Shower Transfer: Shower transfer;Independently;Ambulation ADL Goal: Tub/Shower Transfer - Progress: Progressing toward goals Additional ADL Goal #1: Pt. will initiate am ADLs with no more than one verbal cue Additional ADL Goal #2: Pt. will identify/acknowledge cognitive deficits with min cues ADL Goal: Additional Goal #2 - Progress: Progressing toward goals  Visit Information  Last OT Received On: 06/27/12 Assistance Needed: +1    Subjective Data      Prior Functioning       Cognition  Arousal/Alertness: Awake/alert Orientation Level: Oriented X4 / Intact Behavior During Session: [facility] for tasks performed Current Attention Level: Selective (at times distracted (externally and interally)) Memory Deficits: did not recall therapist's concern for his cognition which she discussed with him in-depth yesterday Following Commands: Follows one step commands consistently;Follows multi-step commands consistently Cognition - Other Comments: Pt required Min A to use map to locate several places in hospital (which is improvement from last session). Pt is able to give directions to familiar places in community/state from memory. Pt able to perform simple math  in head with no errors. Although pt does not feel he has any cognitive deficits, he is willing to go to OPSLP for evaluation and potential treatment. Pt does appear to be  doing much better cognitively today, but high level/complex problem solving continues to be impaired which will impact pt's performance at work.    Mobility  Shoulder Instructions Bed Mobility Supine to Sit: 7: Independent Sitting - Scoot to Edge of Bed: 7: Independent Sit to Supine: 7: Independent Transfers Sit to Stand: 5: Supervision;From bed Stand to Sit: 5: Supervision;To bed       Exercises      Balance     End of Session    GO     [name] Walker 06/27/2012, 3:23 PM

## 2012-06-28 LAB — BASIC METABOLIC PANEL
BUN: 28 mg/dL — ABNORMAL HIGH (ref 6–23)
CO2: 22 mEq/L (ref 19–32)
Calcium: 9.3 mg/dL (ref 8.4–10.5)
Chloride: 104 mEq/L (ref 96–112)
Creatinine, Ser: 0.82 mg/dL (ref 0.50–1.35)
GFR calc Af Amer: >90 mL/min (ref >90)
GFR calc non Af Amer: >90 mL/min (ref >90)
Glucose, Bld: 101 mg/dL — ABNORMAL HIGH (ref 70–99)
Potassium: 4 mEq/L (ref 3.5–5.1)
Sodium: 139 mEq/L (ref 135–145)

## 2012-06-28 LAB — PROTIME-INR
INR: 2.08 — ABNORMAL HIGH (ref 0.00–1.49)
Prothrombin Time: 22.5 seconds — ABNORMAL HIGH (ref 11.6–15.2)

## 2012-06-28 MED ORDER — SPIRONOLACTONE 12.5 MG HALF TABLET: 12.5000 mg | ORAL_TABLET | Freq: Every day | ORAL | Status: DC

## 2012-06-28 MED ORDER — ASPIRIN 81 MG PO CHEW: 81.0000 mg | CHEWABLE_TABLET | Freq: Every day | ORAL | Status: DC

## 2012-06-28 MED ORDER — WARFARIN SODIUM 5 MG PO TABS
5.0000 mg | ORAL_TABLET | Freq: Once | ORAL | Status: AC
  Administered 2012-06-28: 5 mg via ORAL
  Filled 2012-06-28 (×2): qty 1

## 2012-06-28 MED ORDER — WARFARIN - PHYSICIAN DOSING INPATIENT: Freq: Every day | Status: DC

## 2012-06-30 MED FILL — Perflutren Lipid Microsphere IV Susp 1.1 MG/ML: INTRAVENOUS | Qty: 10 | Status: AC

## 2012-07-01 DEATH — deceased

## 2012-07-16 ENCOUNTER — Encounter (HOSPITAL_COMMUNITY)
Admission: RE | Admit: 2012-07-16 | Discharge: 2012-07-16 | Disposition: A | Discharge status: Home / Self Care | Payer: BC Managed Care – PPO | Source: Ambulatory Visit | Attending: Interventional Cardiology | Admitting: Interventional Cardiology

## 2012-07-16 DIAGNOSIS — Z5189 Encounter for other specified aftercare: Secondary | ICD-10-CM | POA: Insufficient documentation

## 2012-07-16 DIAGNOSIS — I2109 ST elevation (STEMI) myocardial infarction involving other coronary artery of anterior wall: Secondary | ICD-10-CM | POA: Insufficient documentation

## 2012-07-16 NOTE — Progress Notes (Signed)
Cardiac Rehab Medication Review by a Pharmacist  Does the patient  feel that his/her medications are working for him/her?  yes  Has the patient been experiencing any side effects to the medications prescribed?  no  Does the patient measure his/her own blood pressure or blood glucose at home?  no - will be starting in the future  Does the patient have any problems obtaining medications due to transportation or finances?   no  Understanding of regimen: good Understanding of indications: good Potential of compliance: excellent    Pharmacist comments: Pt was unaware he was on the wellbutrin for smoking cessation.  Discussed setting his quit date as tomorrow due to having the drug built up in his system already.     Doris Cheadle, PharmD Clinical Pharmacist Pager: [phone] Phone: [phone] 07/16/2012 8:13 AM      [name] Walker, [name] 07/16/2012 8:02 AM

## 2012-07-20 ENCOUNTER — Encounter (HOSPITAL_COMMUNITY)
Admission: RE | Admit: 2012-07-20 | Discharge: 2012-07-20 | Disposition: A | Discharge status: Home / Self Care | Payer: BC Managed Care – PPO | Source: Ambulatory Visit | Attending: Interventional Cardiology | Admitting: Interventional Cardiology

## 2012-07-20 ENCOUNTER — Encounter (HOSPITAL_COMMUNITY): Payer: Self-pay | Admitting: Emergency Medicine

## 2012-07-20 ENCOUNTER — Emergency Department (HOSPITAL_COMMUNITY)
Admission: EM | Admit: 2012-07-20 | Discharge: 2012-07-20 | Disposition: A | Discharge status: Home / Self Care | Payer: BC Managed Care – PPO | Attending: Emergency Medicine | Admitting: Emergency Medicine

## 2012-07-20 ENCOUNTER — Other Ambulatory Visit: Payer: Self-pay

## 2012-07-20 ENCOUNTER — Encounter (HOSPITAL_COMMUNITY): Payer: BC Managed Care – PPO

## 2012-07-20 ENCOUNTER — Emergency Department (HOSPITAL_COMMUNITY): Payer: BC Managed Care – PPO

## 2012-07-20 DIAGNOSIS — I2129 ST elevation (STEMI) myocardial infarction involving other sites: Secondary | ICD-10-CM | POA: Insufficient documentation

## 2012-07-20 DIAGNOSIS — R05 Cough: Secondary | ICD-10-CM | POA: Insufficient documentation

## 2012-07-20 DIAGNOSIS — F17200 Nicotine dependence, unspecified, uncomplicated: Secondary | ICD-10-CM | POA: Insufficient documentation

## 2012-07-20 DIAGNOSIS — Z9861 Coronary angioplasty status: Secondary | ICD-10-CM | POA: Insufficient documentation

## 2012-07-20 DIAGNOSIS — Z79899 Other long term (current) drug therapy: Secondary | ICD-10-CM | POA: Insufficient documentation

## 2012-07-20 DIAGNOSIS — I4891 Unspecified atrial fibrillation: Secondary | ICD-10-CM | POA: Insufficient documentation

## 2012-07-20 DIAGNOSIS — IMO0002 Reserved for concepts with insufficient information to code with codable children: Secondary | ICD-10-CM | POA: Insufficient documentation

## 2012-07-20 DIAGNOSIS — J069 Acute upper respiratory infection, unspecified: Secondary | ICD-10-CM | POA: Insufficient documentation

## 2012-07-20 DIAGNOSIS — R059 Cough, unspecified: Secondary | ICD-10-CM | POA: Insufficient documentation

## 2012-07-20 DIAGNOSIS — Z8679 Personal history of other diseases of the circulatory system: Secondary | ICD-10-CM

## 2012-07-20 DIAGNOSIS — Z8674 Personal history of sudden cardiac arrest: Secondary | ICD-10-CM | POA: Insufficient documentation

## 2012-07-20 DIAGNOSIS — Z8719 Personal history of other diseases of the digestive system: Secondary | ICD-10-CM | POA: Insufficient documentation

## 2012-07-20 DIAGNOSIS — Z7901 Long term (current) use of anticoagulants: Secondary | ICD-10-CM | POA: Insufficient documentation

## 2012-07-20 DIAGNOSIS — Z7982 Long term (current) use of aspirin: Secondary | ICD-10-CM | POA: Insufficient documentation

## 2012-07-20 HISTORY — DX: Acute myocardial infarction, unspecified: I21.9

## 2012-07-20 LAB — COMPREHENSIVE METABOLIC PANEL
ALT: 27 U/L (ref 0–53)
AST: 11 U/L (ref 0–37)
Albumin: 3.2 g/dL — ABNORMAL LOW (ref 3.5–5.2)
Alkaline Phosphatase: 77 U/L (ref 39–117)
BUN: 14 mg/dL (ref 6–23)
CO2: 21 mEq/L (ref 19–32)
Calcium: 9.1 mg/dL (ref 8.4–10.5)
Chloride: 107 mEq/L (ref 96–112)
Creatinine, Ser: 0.77 mg/dL (ref 0.50–1.35)
GFR calc Af Amer: >90 mL/min (ref >90)
GFR calc non Af Amer: >90 mL/min (ref >90)
Glucose, Bld: 122 mg/dL — ABNORMAL HIGH (ref 70–99)
Potassium: 3 mEq/L — ABNORMAL LOW (ref 3.5–5.1)
Sodium: 141 mEq/L (ref 135–145)
Total Bilirubin: 0.5 mg/dL (ref 0.3–1.2)
Total Protein: 6.2 g/dL (ref 6.0–8.3)

## 2012-07-20 LAB — URINALYSIS, ROUTINE W REFLEX MICROSCOPIC
Bilirubin Urine: NEGATIVE
Glucose, UA: NEGATIVE mg/dL
Ketones, ur: NEGATIVE mg/dL
Nitrite: NEGATIVE
Protein, ur: NEGATIVE mg/dL
Specific Gravity, Urine: 1.021 (ref 1.005–1.030)
Urobilinogen, UA: 0.2 mg/dL (ref 0.0–1.0)
pH: 6.5 (ref 5.0–8.0)

## 2012-07-20 LAB — CBC WITH DIFFERENTIAL/PLATELET
Basophils Absolute: 0.1 10*3/uL (ref 0.0–0.1)
Basophils Relative: 0 % (ref 0–1)
Eosinophils Absolute: 0.1 10*3/uL (ref 0.0–0.7)
Eosinophils Relative: 1 % (ref 0–5)
HCT: 37.1 % — ABNORMAL LOW (ref 39.0–52.0)
Hemoglobin: 12.8 g/dL — ABNORMAL LOW (ref 13.0–17.0)
Lymphocytes Relative: 10 % — ABNORMAL LOW (ref 12–46)
Lymphs Abs: 2.1 10*3/uL (ref 0.7–4.0)
MCH: 33.2 pg (ref 26.0–34.0)
MCHC: 34.5 g/dL (ref 30.0–36.0)
MCV: 96.1 fL (ref 78.0–100.0)
Monocytes Absolute: 1.5 10*3/uL — ABNORMAL HIGH (ref 0.1–1.0)
Monocytes Relative: 7 % (ref 3–12)
Neutro Abs: 16.6 10*3/uL — ABNORMAL HIGH (ref 1.7–7.7)
Neutrophils Relative %: 82 % — ABNORMAL HIGH (ref 43–77)
Platelets: 184 10*3/uL (ref 150–400)
RBC: 3.86 MIL/uL — ABNORMAL LOW (ref 4.22–5.81)
RDW: 14.3 % (ref 11.5–15.5)
WBC: 20.4 10*3/uL — ABNORMAL HIGH (ref 4.0–10.5)

## 2012-07-20 LAB — URINE MICROSCOPIC-ADD ON

## 2012-07-20 LAB — PROTIME-INR
INR: 1.02 (ref 0.00–1.49)
Prothrombin Time: 13.3 seconds (ref 11.6–15.2)

## 2012-07-20 LAB — APTT: aPTT: 31 seconds (ref 24–37)

## 2012-07-20 LAB — TROPONIN I: Troponin I: <0.3 ng/mL (ref <0.30)

## 2012-07-20 MED ORDER — POTASSIUM CHLORIDE CRYS ER 20 MEQ PO TBCR
40.0000 meq | EXTENDED_RELEASE_TABLET | Freq: Once | ORAL | Status: AC
  Administered 2012-07-20: 40 meq via ORAL
  Filled 2012-07-20: qty 2

## 2012-07-20 NOTE — Progress Notes (Signed)
[name] Walker came to cardiac rehab today for his first day of exercise.  Patient placed on monitor rhythm SVT 171.  Patient taken to treatment room placed on Zoll.  Blood pressure 92/60. Patient asymptomatic.  Sa02 98%.  Patient placed on 4l/min of oxygen and transported to the ED via stretcher.  Dr Michaelle Copas office called and notified. Report given to ED RN.  Patient's next of kin called and notified.

## 2012-07-20 NOTE — ED Notes (Signed)
Patient transported to X-ray 

## 2012-07-20 NOTE — ED Notes (Signed)
Pt from Cardiac rehab, pulse ranging in 160. Pt. Denies CP, SOB, N/V. Pt states" I have felt great the last couple of days."

## 2012-07-20 NOTE — Progress Notes (Signed)
Mr [name] Walker says he smoked 2 cigarettes prior to coming to exercise. Encouraged Mr [name] Walker to stop smoking.

## 2012-07-20 NOTE — ED Provider Notes (Signed)
History     CSN: [id number]  Arrival date & time 07/20/12  6962   First MD Initiated Contact with Patient 07/20/12 1006      Chief Complaint  Patient presents with  . Tachycardia    (Consider location/radiation/quality/duration/timing/severity/associated sxs/prior treatment) HPI Pt in cardiac rehab today after MI and cardiopulmonary arrest in late Dec requiring stenting of LAD. Noted to be tachycardic in 160's and sent to ED. Pt denies CP, palpitation, SOB, leg swelling. States he has had URI with cough for several weeks and is currently taking prednisone after having course of antibiotics. No fever or chills.  Past Medical History  Diagnosis Date  . Varicose veins   . No pertinent past medical history   . MI (myocardial infarction) 06/19/2012    Past Surgical History  Procedure Date  . Coronary stent placement     History reviewed. No pertinent family history.  History  Substance Use Topics  . Smoking status: Current Every Day Smoker -- 0.5 packs/day    Types: Cigarettes  . Smokeless tobacco: Not on file  . Alcohol Use: No      Review of Systems  Constitutional: Negative for fever, chills, diaphoresis and fatigue.  HENT: Negative for neck pain.   Respiratory: Positive for cough. Negative for shortness of breath.   Cardiovascular: Negative for chest pain, palpitations and leg swelling.  Gastrointestinal: Negative for nausea, vomiting, abdominal pain and diarrhea.  Musculoskeletal: Negative for myalgias and back pain.  Skin: Negative for rash and wound.  Neurological: Negative for dizziness, syncope, weakness, light-headedness, numbness and headaches.  All other systems reviewed and are negative.    Allergies  Review of patient's allergies indicates no known allergies.  Home Medications   Current Outpatient Rx  Name  Route  Sig  Dispense  Refill  . ASPIRIN 81 MG PO CHEW   Oral   Chew 1 tablet (81 mg total) by mouth daily.         . ATORVASTATIN  CALCIUM 20 MG PO TABS   Oral   Take 1 tablet (20 mg total) by mouth daily at 6 PM.   30 tablet   11   . BUPROPION HCL ER (XL) 150 MG PO TB24   Oral   Take 1 tablet (150 mg total) by mouth daily.   30 tablet   3   . CLOPIDOGREL BISULFATE 75 MG PO TABS   Oral   Take 1 tablet (75 mg total) by mouth daily with breakfast.   30 tablet   11   . PRESCRIPTION MEDICATION   Oral   Take 1 tablet by mouth daily. Prednisone         . NYQUIL PO   Oral   Take 15 mLs by mouth at bedtime.         . SPIRONOLACTONE 25 MG PO TABS   Oral   Take 12.5 mg by mouth daily.           BP 111/78  Pulse 99  Resp 15  SpO2 98%  Physical Exam  Nursing note and vitals reviewed. Constitutional: He is oriented to person, place, and time. He appears well-developed and well-nourished. No distress.  HENT:  Head: Normocephalic and atraumatic.  Mouth/Throat: Oropharynx is clear and moist.  Eyes: EOM are normal. Pupils are equal, round, and reactive to light.  Neck: Normal range of motion. Neck supple.  Cardiovascular: Exam reveals no gallop and no friction rub.   No murmur heard.  irreg irreg tachy  Pulmonary/Chest: Effort normal. No respiratory distress. He has no wheezes. He has rales (few rales in L base).  Abdominal: Soft. Bowel sounds are normal. He exhibits no mass. There is no tenderness. There is no rebound and no guarding.  Musculoskeletal: Normal range of motion. He exhibits no edema and no tenderness.       No calf swelling or tenderness  Neurological: He is alert and oriented to person, place, and time.       5/5 motor, sensation intact  Skin: Skin is warm and dry. No rash noted. No erythema.  Psychiatric: He has a normal mood and affect. His behavior is normal.    ED Course  Procedures (including critical care time)  Labs Reviewed  CBC WITH DIFFERENTIAL - Abnormal; Notable for the following:    WBC 20.4 (*)     RBC 3.86 (*)     Hemoglobin 12.8 (*)     HCT 37.1 (*)      Neutrophils Relative 82 (*)     Neutro Abs 16.6 (*)     Lymphocytes Relative 10 (*)     Monocytes Absolute 1.5 (*)     All other components within normal limits  COMPREHENSIVE METABOLIC PANEL - Abnormal; Notable for the following:    Potassium 3.0 (*)     Glucose, Bld 122 (*)     Albumin 3.2 (*)     All other components within normal limits  URINALYSIS, ROUTINE W REFLEX MICROSCOPIC - Abnormal; Notable for the following:    APPearance CLOUDY (*)     Hgb urine dipstick MODERATE (*)     Leukocytes, UA SMALL (*)     All other components within normal limits  URINE MICROSCOPIC-ADD ON - Abnormal; Notable for the following:    Crystals CA OXALATE CRYSTALS (*)     All other components within normal limits  PROTIME-INR  APTT  TROPONIN I   Dg Chest 1 View  07/20/2012  *RADIOLOGY REPORT*  Clinical Data: Tachycardia  CHEST - 1 VIEW  Comparison: 06/27/2012  Findings: Borderline cardiomegaly.  No acute infiltrate or pleural effusion.  No pulmonary edema. Bony thorax is stable.  IMPRESSION: Borderline cardiomegaly.  No active disease.   Original Report Authenticated By: Natasha Mead, M.D.      1. Atrial fibrillation, currently in sinus rhythm      Date: 07/20/2012  Rate:99  Rhythm: normal sinus rhythm  QRS Axis: normal  Intervals: normal  ST/T Wave abnormalities: nonspecific T wave changes  Conduction Disutrbances:none  Narrative Interpretation:   Old EKG Reviewed: changes noted Scattered PVC and PAC's   MDM  Pt spontaneously converted to NSR while in room. Remains asymptomatic   Discussed with Dr Mayford Knife who suggested pt be d/c and come to office today at 1:30 for possible restart of coumadin.     Pt remain symptomatic in NSR. Will d/c to f/u with Dr Mayford Knife today.   Loren Racer, MD 07/20/12 1231

## 2012-07-22 ENCOUNTER — Encounter (HOSPITAL_COMMUNITY): Admission: RE | Admit: 2012-07-22 | Payer: BC Managed Care – PPO | Source: Ambulatory Visit

## 2012-07-22 ENCOUNTER — Encounter (HOSPITAL_COMMUNITY): Payer: BC Managed Care – PPO

## 2012-07-23 DIAGNOSIS — I469 Cardiac arrest, cause unspecified: Secondary | ICD-10-CM

## 2012-07-23 DIAGNOSIS — R41844 Frontal lobe and executive function deficit: Secondary | ICD-10-CM

## 2012-07-23 DIAGNOSIS — G9340 Encephalopathy, unspecified: Secondary | ICD-10-CM

## 2012-07-24 ENCOUNTER — Encounter (HOSPITAL_COMMUNITY): Payer: BC Managed Care – PPO

## 2012-07-27 ENCOUNTER — Encounter (HOSPITAL_COMMUNITY): Payer: BC Managed Care – PPO

## 2012-07-29 ENCOUNTER — Encounter (HOSPITAL_COMMUNITY): Admission: RE | Admit: 2012-07-29 | Payer: BC Managed Care – PPO | Source: Ambulatory Visit

## 2012-07-29 ENCOUNTER — Encounter (HOSPITAL_COMMUNITY): Payer: BC Managed Care – PPO

## 2012-07-29 ENCOUNTER — Telehealth (HOSPITAL_COMMUNITY): Payer: Self-pay | Admitting: *Deleted

## 2012-07-30 DIAGNOSIS — R41844 Frontal lobe and executive function deficit: Secondary | ICD-10-CM

## 2012-07-30 DIAGNOSIS — G9340 Encephalopathy, unspecified: Secondary | ICD-10-CM

## 2012-07-30 DIAGNOSIS — I469 Cardiac arrest, cause unspecified: Secondary | ICD-10-CM

## 2012-07-31 ENCOUNTER — Encounter (HOSPITAL_COMMUNITY)
Admission: RE | Admit: 2012-07-31 | Discharge: 2012-07-31 | Disposition: A | Discharge status: Home / Self Care | Payer: BC Managed Care – PPO | Source: Ambulatory Visit | Attending: Interventional Cardiology | Admitting: Interventional Cardiology

## 2012-07-31 ENCOUNTER — Encounter (HOSPITAL_COMMUNITY): Payer: BC Managed Care – PPO

## 2012-07-31 NOTE — Progress Notes (Signed)
Today was [name] Walker's first day of exercise at cardiac rehab.  Telemetry rhythm Sinus with T wave inversion in lead 11. Heart rate 69.  Blood pressure 104/60.  I did not see any previous documentation of T wave inversion from past hospital 12 lead ECG's.  Dr Michaelle Copas office called and notified. I spoke with Dr Michaelle Copas nurse Marjean Donna.  Dr Katrinka Blazing paged and notified.  Dr. Katrinka Blazing said it was okay for [name] Walker to exercise as long as he feels okay.  No complaints noted today. Will continue to monitor the patient throughout  the program.

## 2012-08-03 ENCOUNTER — Encounter (HOSPITAL_COMMUNITY)
Admission: RE | Admit: 2012-08-03 | Discharge: 2012-08-03 | Disposition: A | Discharge status: Home / Self Care | Payer: BC Managed Care – PPO | Source: Ambulatory Visit | Attending: Interventional Cardiology | Admitting: Interventional Cardiology

## 2012-08-03 ENCOUNTER — Encounter (HOSPITAL_COMMUNITY): Payer: BC Managed Care – PPO

## 2012-08-03 DIAGNOSIS — Z5189 Encounter for other specified aftercare: Secondary | ICD-10-CM | POA: Insufficient documentation

## 2012-08-03 DIAGNOSIS — I2109 ST elevation (STEMI) myocardial infarction involving other coronary artery of anterior wall: Secondary | ICD-10-CM | POA: Insufficient documentation

## 2012-08-05 ENCOUNTER — Encounter (HOSPITAL_COMMUNITY): Payer: BC Managed Care – PPO

## 2012-08-07 ENCOUNTER — Encounter (HOSPITAL_COMMUNITY): Payer: BC Managed Care – PPO

## 2012-08-07 ENCOUNTER — Encounter (HOSPITAL_COMMUNITY)
Admission: RE | Admit: 2012-08-07 | Discharge: 2012-08-07 | Disposition: A | Discharge status: Home / Self Care | Payer: BC Managed Care – PPO | Source: Ambulatory Visit | Attending: Interventional Cardiology | Admitting: Interventional Cardiology

## 2012-08-07 NOTE — Progress Notes (Signed)
Reviewed [name] Walker's quality of life.  [name] Walker's he feels better and is still working on smoking cessation.

## 2012-08-10 ENCOUNTER — Encounter (HOSPITAL_COMMUNITY)
Admission: RE | Admit: 2012-08-10 | Discharge: 2012-08-10 | Disposition: A | Discharge status: Home / Self Care | Payer: BC Managed Care – PPO | Source: Ambulatory Visit | Attending: Interventional Cardiology | Admitting: Interventional Cardiology

## 2012-08-10 ENCOUNTER — Encounter (HOSPITAL_COMMUNITY): Payer: BC Managed Care – PPO

## 2012-08-10 NOTE — Progress Notes (Signed)
Reviewed home exercise with pt today.  Pt plans to walk at home and mall for exercise.  Reviewed THR, pulse, RPE, sign and symptoms, NTG use, and when to call 911 or MD.  Pt voiced understanding. Fabio Pierce, MA, ACSM RCEP

## 2012-08-12 ENCOUNTER — Encounter (HOSPITAL_COMMUNITY): Payer: BC Managed Care – PPO

## 2012-08-12 ENCOUNTER — Encounter (HOSPITAL_COMMUNITY)
Admission: RE | Admit: 2012-08-12 | Discharge: 2012-08-12 | Disposition: A | Discharge status: Home / Self Care | Payer: BC Managed Care – PPO | Source: Ambulatory Visit | Attending: Interventional Cardiology | Admitting: Interventional Cardiology

## 2012-08-14 ENCOUNTER — Encounter (HOSPITAL_COMMUNITY): Payer: BC Managed Care – PPO

## 2012-08-17 ENCOUNTER — Encounter (HOSPITAL_COMMUNITY): Payer: BC Managed Care – PPO

## 2012-08-17 ENCOUNTER — Encounter (HOSPITAL_COMMUNITY)
Admission: RE | Admit: 2012-08-17 | Discharge: 2012-08-17 | Disposition: A | Discharge status: Home / Self Care | Payer: BC Managed Care – PPO | Source: Ambulatory Visit | Attending: Interventional Cardiology | Admitting: Interventional Cardiology

## 2012-08-17 NOTE — Progress Notes (Signed)
[name] Walker's entry blood pressure was 90/60 pre exercise via dinemapp.  Patient asymptomatic.  Patient given Gatorade to drink.  Systolic blood pressures ranged from 84-110 systolic and 56-64 diastolic.  [name] Walker remained asymptomatic today during exercise.  Exit blood pressure 126/62.  Heart rate 71.  Dr Michaelle Copas office called and notified of low blood pressure today during exercise at cardiac rehab. Will continue to monitor the patient throughout  the program.

## 2012-08-19 ENCOUNTER — Encounter (HOSPITAL_COMMUNITY): Payer: BC Managed Care – PPO

## 2012-08-19 ENCOUNTER — Encounter (HOSPITAL_COMMUNITY)
Admission: RE | Admit: 2012-08-19 | Discharge: 2012-08-19 | Disposition: A | Discharge status: Home / Self Care | Payer: BC Managed Care – PPO | Source: Ambulatory Visit | Attending: Interventional Cardiology | Admitting: Interventional Cardiology

## 2012-08-21 ENCOUNTER — Encounter (HOSPITAL_COMMUNITY): Payer: BC Managed Care – PPO

## 2012-08-21 ENCOUNTER — Encounter (HOSPITAL_COMMUNITY)
Admission: RE | Admit: 2012-08-21 | Discharge: 2012-08-21 | Disposition: A | Discharge status: Home / Self Care | Payer: BC Managed Care – PPO | Source: Ambulatory Visit | Attending: Interventional Cardiology | Admitting: Interventional Cardiology

## 2012-08-24 ENCOUNTER — Encounter (HOSPITAL_COMMUNITY): Payer: BC Managed Care – PPO

## 2012-08-24 ENCOUNTER — Encounter (HOSPITAL_COMMUNITY)
Admission: RE | Admit: 2012-08-24 | Discharge: 2012-08-24 | Disposition: A | Discharge status: Home / Self Care | Payer: BC Managed Care – PPO | Source: Ambulatory Visit | Attending: Interventional Cardiology | Admitting: Interventional Cardiology

## 2012-08-26 ENCOUNTER — Encounter (HOSPITAL_COMMUNITY)
Admission: RE | Admit: 2012-08-26 | Discharge: 2012-08-26 | Disposition: A | Discharge status: Home / Self Care | Payer: BC Managed Care – PPO | Source: Ambulatory Visit | Attending: Interventional Cardiology | Admitting: Interventional Cardiology

## 2012-08-26 ENCOUNTER — Encounter (HOSPITAL_COMMUNITY): Payer: BC Managed Care – PPO

## 2012-08-26 NOTE — Progress Notes (Signed)
[name] Walker 57 y.o. male Nutrition Note Spoke with pt. Nutrition Plan and Nutrition Survey goals reviewed with pt. Pt is following Step 1 of the Therapeutic Lifestyle Changes diet. Pt trying to make dietary changes, quit using tobacco, lose wt, and return to work in "hopefully 2 weeks." Realistic goal prioritization/setting discussed. Sodium content of processed foods reviewed. Pt expressed understanding of the information reviewed. Pt aware of nutrition education classes offered and is unable to attend nutrition classes.  Nutrition Diagnosis   Food-and nutrition-related knowledge deficit related to lack of exposure to information as related to diagnosis of: ? CVD  Nutrition RX/ Re-Estimated Daily Nutrition Needs for: wt maintenance 1600-2100 Kcal, 80-90 gm fat, 16-18 gm sat fat, 2.4-2.8 gm trans-fat, <1500 mg sodium   Nutrition Intervention   Pt's individual nutrition plan reviewed with pt.   Benefits of adopting Therapeutic Lifestyle Changes discussed when Medficts reviewed.   Pt to attend the Portion Distortion class   Pt given handouts for: ? Nutrition I class ? Nutrition II class   Continue client-centered nutrition education by RD, as part of interdisciplinary care. Goal(s)   Pt to identify and limit food sources of saturated fat, trans fat, and cholesterol   Pt to describe the benefit of including fruits, vegetables, whole grains, and low-fat dairy products in a heart healthy meal plan. Monitor and Evaluate progress toward nutrition goal with team. Nutrition Risk: Change to Moderate

## 2012-08-28 ENCOUNTER — Encounter (HOSPITAL_COMMUNITY): Payer: BC Managed Care – PPO

## 2012-08-28 ENCOUNTER — Encounter (HOSPITAL_COMMUNITY)
Admission: RE | Admit: 2012-08-28 | Discharge: 2012-08-28 | Disposition: A | Discharge status: Home / Self Care | Payer: BC Managed Care – PPO | Source: Ambulatory Visit | Attending: Interventional Cardiology | Admitting: Interventional Cardiology

## 2012-08-31 ENCOUNTER — Encounter (HOSPITAL_COMMUNITY): Payer: BC Managed Care – PPO

## 2012-08-31 ENCOUNTER — Encounter (HOSPITAL_COMMUNITY)
Admission: RE | Admit: 2012-08-31 | Discharge: 2012-08-31 | Disposition: A | Discharge status: Home / Self Care | Payer: BC Managed Care – PPO | Source: Ambulatory Visit | Attending: Interventional Cardiology | Admitting: Interventional Cardiology

## 2012-08-31 DIAGNOSIS — I2109 ST elevation (STEMI) myocardial infarction involving other coronary artery of anterior wall: Secondary | ICD-10-CM | POA: Insufficient documentation

## 2012-08-31 DIAGNOSIS — Z5189 Encounter for other specified aftercare: Secondary | ICD-10-CM | POA: Insufficient documentation

## 2012-09-02 ENCOUNTER — Encounter (HOSPITAL_COMMUNITY): Payer: BC Managed Care – PPO

## 2012-09-04 ENCOUNTER — Encounter (HOSPITAL_COMMUNITY): Payer: BC Managed Care – PPO

## 2012-09-07 ENCOUNTER — Encounter (HOSPITAL_COMMUNITY): Payer: BC Managed Care – PPO

## 2012-09-09 ENCOUNTER — Encounter (HOSPITAL_COMMUNITY): Payer: BC Managed Care – PPO

## 2012-09-09 ENCOUNTER — Encounter (HOSPITAL_COMMUNITY)
Admission: RE | Admit: 2012-09-09 | Discharge: 2012-09-09 | Disposition: A | Discharge status: Home / Self Care | Payer: BC Managed Care – PPO | Source: Ambulatory Visit | Attending: Interventional Cardiology | Admitting: Interventional Cardiology

## 2012-09-11 ENCOUNTER — Encounter (HOSPITAL_COMMUNITY)
Admission: RE | Admit: 2012-09-11 | Discharge: 2012-09-11 | Disposition: A | Discharge status: Home / Self Care | Payer: BC Managed Care – PPO | Source: Ambulatory Visit | Attending: Interventional Cardiology | Admitting: Interventional Cardiology

## 2012-09-11 ENCOUNTER — Encounter (HOSPITAL_COMMUNITY): Payer: BC Managed Care – PPO

## 2012-09-14 ENCOUNTER — Encounter (HOSPITAL_COMMUNITY): Payer: BC Managed Care – PPO

## 2012-09-16 ENCOUNTER — Encounter (HOSPITAL_COMMUNITY): Payer: BC Managed Care – PPO

## 2012-09-18 ENCOUNTER — Encounter (HOSPITAL_COMMUNITY): Payer: BC Managed Care – PPO

## 2012-09-21 ENCOUNTER — Encounter (HOSPITAL_COMMUNITY): Payer: BC Managed Care – PPO

## 2012-09-23 ENCOUNTER — Encounter (HOSPITAL_COMMUNITY): Payer: BC Managed Care – PPO

## 2012-09-25 ENCOUNTER — Encounter (HOSPITAL_COMMUNITY): Payer: BC Managed Care – PPO

## 2012-09-28 ENCOUNTER — Encounter (HOSPITAL_COMMUNITY): Payer: BC Managed Care – PPO

## 2012-09-30 ENCOUNTER — Encounter (HOSPITAL_COMMUNITY): Payer: BC Managed Care – PPO

## 2012-09-30 ENCOUNTER — Telehealth (HOSPITAL_COMMUNITY): Payer: Self-pay | Admitting: *Deleted

## 2012-10-02 ENCOUNTER — Encounter (HOSPITAL_COMMUNITY): Payer: BC Managed Care – PPO

## 2012-10-05 ENCOUNTER — Encounter (HOSPITAL_COMMUNITY): Payer: BC Managed Care – PPO

## 2012-10-07 ENCOUNTER — Encounter (HOSPITAL_COMMUNITY): Payer: BC Managed Care – PPO

## 2012-10-09 ENCOUNTER — Encounter (HOSPITAL_COMMUNITY): Payer: BC Managed Care – PPO

## 2012-10-12 ENCOUNTER — Encounter (HOSPITAL_COMMUNITY): Payer: BC Managed Care – PPO

## 2012-10-14 ENCOUNTER — Encounter (HOSPITAL_COMMUNITY): Payer: BC Managed Care – PPO

## 2012-10-16 ENCOUNTER — Encounter (HOSPITAL_COMMUNITY): Payer: BC Managed Care – PPO

## 2012-10-19 ENCOUNTER — Encounter (HOSPITAL_COMMUNITY): Payer: BC Managed Care – PPO

## 2012-10-21 ENCOUNTER — Encounter (HOSPITAL_COMMUNITY): Payer: BC Managed Care – PPO

## 2012-10-23 ENCOUNTER — Encounter (HOSPITAL_COMMUNITY): Payer: BC Managed Care – PPO

## 2013-06-01 ENCOUNTER — Encounter: Payer: Self-pay | Admitting: Interventional Cardiology

## 2013-06-02 ENCOUNTER — Ambulatory Visit (INDEPENDENT_AMBULATORY_CARE_PROVIDER_SITE_OTHER): Payer: BC Managed Care – PPO | Admitting: Interventional Cardiology

## 2013-06-02 VITALS — BP 122/100 | HR 84 | Ht 71.5 in | Wt 216.0 lb

## 2013-06-02 DIAGNOSIS — F17200 Nicotine dependence, unspecified, uncomplicated: Secondary | ICD-10-CM

## 2013-06-02 DIAGNOSIS — I2589 Other forms of chronic ischemic heart disease: Secondary | ICD-10-CM

## 2013-06-02 DIAGNOSIS — I5022 Chronic systolic (congestive) heart failure: Secondary | ICD-10-CM

## 2013-06-02 DIAGNOSIS — E785 Hyperlipidemia, unspecified: Secondary | ICD-10-CM

## 2013-06-02 DIAGNOSIS — I10 Essential (primary) hypertension: Secondary | ICD-10-CM

## 2013-06-02 DIAGNOSIS — I2510 Atherosclerotic heart disease of native coronary artery without angina pectoris: Secondary | ICD-10-CM

## 2013-06-02 DIAGNOSIS — Z720 Tobacco use: Secondary | ICD-10-CM

## 2013-06-02 DIAGNOSIS — I255 Ischemic cardiomyopathy: Secondary | ICD-10-CM

## 2013-06-02 MED ORDER — ATORVASTATIN CALCIUM 20 MG PO TABS: 20.0000 mg | ORAL_TABLET | Freq: Every day | ORAL | Status: DC

## 2013-06-02 MED ORDER — METOPROLOL TARTRATE 25 MG PO TABS: 12.5000 mg | ORAL_TABLET | Freq: Two times a day (BID) | ORAL | Status: DC

## 2013-06-02 MED ORDER — CLOPIDOGREL BISULFATE 75 MG PO TABS: 75.0000 mg | ORAL_TABLET | Freq: Every day | ORAL | Status: DC

## 2013-06-02 NOTE — Patient Instructions (Signed)
Your physician recommends that you continue on your current medications as directed. Please refer to the Current Medication list given to you today.  Your medications have been refilled  Your physician recommends that you return for a FASTING lipid profile and alt 06/09/13  Your physician wants you to follow-up in: 6 months You will receive a reminder letter in the mail two months in advance. If you don't receive a letter, please call our office to schedule the follow-up appointment.

## 2013-06-02 NOTE — Progress Notes (Signed)
Patient ID: [name] Walker, male   DOB: 1956-01-19, 57 y.o.   MRN: [id number]    [address], [address] [place], [place]  [address] Phone: [phone] Fax:  [phone]  Date:  06/02/2013   ID:  [name] Walker, DOB 04/24/1956, MRN [id number]  PCP:  Default, Provider, MD   ASSESSMENT:  1. Chronic systolic heart failure, functional class II status post anterior myocardial infarction December 2013 2. Coronary atherosclerotic heart disease status post LAD stent during complicated myocardial infarction, December 2013 3. Hyperlipidemia with no data on lipids 4. Hypertension, with less than adequate diastolic control. Repeat blood pressure today 132/84 mmHg   PLAN:  1. Fasting statin panel 2. Clinical followup in 6 months 3. No change in medical regimen 4. Encourage cigarette smoking cessation   SUBJECTIVE: [name] Walker is a 57 y.o. male who had an incredible outcome from a severe anterior myocardial infarction earlier in the year. He suffered ventricular fibrillation cardiac arrest, had prolonged CPR, underwent PCI successfully, and hypothermia, early heart failure, and severe COPD requiring intubation. He subsequently gradually improved and is now back at work functioning at a high level both cognitively and physically. He denies chest pain. There is no significant dyspnea. He continues to smoke cigarettes despite my repeated requests that he stop smoking. He has not needed nitroglycerin. He denies orthopnea and edema.   Wt Readings from Last 3 Encounters:  06/02/13 216 lb (97.977 kg)  07/16/12 191 lb 9.3 oz (86.9 kg)  06/23/12 206 lb 12.7 oz (93.8 kg)     Past Medical History  Diagnosis Date  . Varicose veins   . No pertinent past medical history   . MI (myocardial infarction) 06/19/2012    Current Outpatient Prescriptions  Medication Sig Dispense Refill  . aspirin 81 MG chewable tablet Chew 1 tablet (81 mg total) by mouth daily.      Marland Kitchen atorvastatin (LIPITOR) 20 MG tablet Take  1 tablet (20 mg total) by mouth daily at 6 PM.  30 tablet  11  . clopidogrel (PLAVIX) 75 MG tablet Take 1 tablet (75 mg total) by mouth daily with breakfast.  30 tablet  11  . metoprolol tartrate (LOPRESSOR) 25 MG tablet Take 25 mg by mouth 2 (two) times daily. 12.5mg  twice a day. Takes a half a tablet twice a day.      . Pseudoeph-Doxylamine-DM-APAP (NYQUIL PO) Take 15 mLs by mouth as needed.        No current facility-administered medications for this visit.    Allergies:   No Known Allergies  Social History:  The patient  reports that he has been smoking Cigarettes.  He has been smoking about 0.50 packs per day. He does not have any smokeless tobacco history on file. He reports that he does not drink alcohol or use illicit drugs.   ROS:  Please see the history of present illness.   Smoking as noted above. No cough or hemoptysis. Appetite has been good. He denies gastrointestinal complaints. No neurological symptoms.   All other systems reviewed and negative.   OBJECTIVE: VS:  BP 122/100  Pulse 84  Ht 5' 11.5" (1.816 m)  Wt 216 lb (97.977 kg)  BMI 29.71 kg/m2 Well nourished, well developed, in no acute distress, smells heavily of stale cigarette smoke HEENT: normal Neck: JVD flat. Carotid bruit 2+ bilateral  Cardiac:  normal S1, S2; RRR; no murmur Lungs:  clear to auscultation bilaterally, no wheezing, rhonchi or rales Abd: soft, nontender, no hepatomegaly Ext:  Edema absent. Pulses 2+ bilateral Skin: warm and dry Neuro:  CNs 2-12 intact, no focal abnormalities noted  EKG:  Sinus rhythm with vertical axis and evidence of anteroseptal infarction. No acute change noted       Signed, Darci Needle III, MD 06/02/2013 3:45 PM

## 2013-06-04 ENCOUNTER — Encounter: Payer: Self-pay | Admitting: Interventional Cardiology

## 2013-06-04 DIAGNOSIS — I2510 Atherosclerotic heart disease of native coronary artery without angina pectoris: Secondary | ICD-10-CM | POA: Insufficient documentation

## 2013-06-09 ENCOUNTER — Other Ambulatory Visit (INDEPENDENT_AMBULATORY_CARE_PROVIDER_SITE_OTHER): Payer: BC Managed Care – PPO

## 2013-06-09 DIAGNOSIS — E785 Hyperlipidemia, unspecified: Secondary | ICD-10-CM

## 2013-06-09 LAB — LIPID PANEL
Cholesterol: 149 mg/dL (ref 0–200)
HDL: 44.2 mg/dL (ref >39.00)
LDL Cholesterol: 83 mg/dL (ref 0–99)
Total CHOL/HDL Ratio: 3
Triglycerides: 108 mg/dL (ref 0.0–149.0)
VLDL: 21.6 mg/dL (ref 0.0–40.0)

## 2013-06-09 LAB — ALT: ALT: 18 U/L (ref 0–53)

## 2013-06-15 ENCOUNTER — Telehealth: Payer: Self-pay

## 2013-06-15 NOTE — Telephone Encounter (Signed)
called to give pt lab reults.  Near goal. Continue same therapy. Repeat 1 year will mail copy to pt

## 2013-06-15 NOTE — Telephone Encounter (Signed)
Message copied by Jarvis Newcomer on Tue Jun 15, 2013  2:38 PM ------      Message from: Verdis Prime      Created: Thu Jun 10, 2013  6:26 PM       Near goal. Continue same therapy. Repeat 1 year ------

## 2013-06-16 NOTE — Telephone Encounter (Signed)
Message copied by Jarvis Newcomer on Wed Jun 16, 2013  4:13 PM ------      Message from: Verdis Prime      Created: Thu Jun 10, 2013  6:26 PM       Near goal. Continue same therapy. Repeat 1 year ------

## 2013-06-16 NOTE — Telephone Encounter (Signed)
unable to lmom results mailed to pt

## 2013-12-20 ENCOUNTER — Encounter: Payer: Self-pay | Admitting: Interventional Cardiology

## 2013-12-20 ENCOUNTER — Ambulatory Visit (INDEPENDENT_AMBULATORY_CARE_PROVIDER_SITE_OTHER): Payer: BC Managed Care – PPO | Admitting: Interventional Cardiology

## 2013-12-20 VITALS — BP 139/89 | HR 71 | Ht 61.5 in | Wt 216.0 lb

## 2013-12-20 DIAGNOSIS — I10 Essential (primary) hypertension: Secondary | ICD-10-CM

## 2013-12-20 DIAGNOSIS — I209 Angina pectoris, unspecified: Secondary | ICD-10-CM

## 2013-12-20 DIAGNOSIS — I25118 Atherosclerotic heart disease of native coronary artery with other forms of angina pectoris: Secondary | ICD-10-CM

## 2013-12-20 DIAGNOSIS — R079 Chest pain, unspecified: Secondary | ICD-10-CM

## 2013-12-20 DIAGNOSIS — I5022 Chronic systolic (congestive) heart failure: Secondary | ICD-10-CM

## 2013-12-20 DIAGNOSIS — I2510 Atherosclerotic heart disease of native coronary artery without angina pectoris: Secondary | ICD-10-CM

## 2013-12-20 NOTE — Patient Instructions (Signed)
Your physician recommends that you continue on your current medications as directed. Please refer to the Current Medication list given to you today.  Your physician has requested that you have an exercise tolerance test. For further information please visit [email or web address]. Please also follow instruction sheet, as given.  Your physician has requested that you have an echocardiogram. Echocardiography is a painless test that uses sound waves to create images of your heart. It provides your doctor with information about the size and shape of your heart and how well your heart's chambers and valves are working. This procedure takes approximately one hour. There are no restrictions for this procedure.  Your physician wants you to follow-up in: 1 year You will receive a reminder letter in the mail two months in advance. If you don't receive a letter, please call our office to schedule the follow-up appointment.  

## 2013-12-20 NOTE — Progress Notes (Signed)
Patient ID: [name] Walker Felipe, male   DOB: [birth year], 58 y.o.   MRN: [id number]    [address] [place], [place]  [address] Phone: [phone] Fax:  [phone]  Date:  12/20/2013   ID:  [name] Walker Rio, DOB [birth year], MRN [id number]  PCP:  Default, Provider, MD   ASSESSMENT:  1. He has occasional "pinching discomfort" in the right upper chest with physical activity 2. Chronic systolic heart failure, LVEF post MI at 30-35%. Asymptomatic 3. Coronary artery disease with LAD bare-metal stent during acute myocardial infarction 4. History of cardiac arrest due to ventricular fibrillation temporally related to acute infarction 5. Continued tobacco use  PLAN:  1. Exercise treadmill test given the bare-metal stent in the LAD 2. Encouraged to discontinue smoking 3. Consider discontinuing Plavix 4. 2-D Doppler echocardiogram to reassess LV function   SUBJECTIVE: [name] Walker Avetisyan is a 58 y.o. male who is doing well. He denies dyspnea. His back to fully active lifestyle. He is accompanied cardiac history in that he had ventricular fibrillation as a complication of an anterior myocardial infarction and had prolonged outpatient CPR. The right closely he underwent a successful acute intervention, received a bare-metal stent to the LAD, and has recovered fully and is back to work. He has no anginal complaints that are of classical nature. He does complain of a pinching sensation in the right upper chest with physical activity. He resolves with rest. He denies orthopnea, PND, and lower extremity swelling. He has not had syncope or near-syncope. He plays golf regularly. He continues to smoke.   Wt Readings from Last 3 Encounters:  12/20/13 216 lb (97.977 kg)  06/02/13 216 lb (97.977 kg)  07/16/12 191 lb 9.3 oz (86.9 kg)     Past Medical History  Diagnosis Date  . Varicose veins   . No pertinent past medical history   . MI (myocardial infarction) 06/19/2012    Current Outpatient  Prescriptions  Medication Sig Dispense Refill  . aspirin 81 MG tablet Take 81 mg by mouth daily.      Marland Kitchen. atorvastatin (LIPITOR) 20 MG tablet Take 1 tablet (20 mg total) by mouth daily at 6 PM.  30 tablet  11  . clopidogrel (PLAVIX) 75 MG tablet Take 1 tablet (75 mg total) by mouth daily with breakfast.  30 tablet  11  . metoprolol tartrate (LOPRESSOR) 25 MG tablet Take 0.5 tablets (12.5 mg total) by mouth 2 (two) times daily.  30 tablet  11  . Pseudoeph-Doxylamine-DM-APAP (NYQUIL PO) Take 15 mLs by mouth as needed.        No current facility-administered medications for this visit.    Allergies:   No Known Allergies  Social History:  The patient  reports that he has been smoking Cigarettes.  He has been smoking about 0.50 packs per day. He does not have any smokeless tobacco history on file. He reports that he does not drink alcohol or use illicit drugs.   ROS:  Please see the history of present illness.   No medication side effects. No blood in the sure and the stool.   All other systems reviewed and negative.   OBJECTIVE: VS:  BP 139/89  Pulse 71  Ht 5' 1.5" (1.562 m)  Wt 216 lb (97.977 kg)  BMI 40.16 kg/m2 Well nourished, well developed, in no acute distress, older than stated age HEENT: normal Neck: JVD flat. Carotid bruit absent  Cardiac:  normal S1, S2; RRR; no murmur Lungs:  clear to auscultation  bilaterally, no wheezing, rhonchi or rales Abd: soft, nontender, no hepatomegaly Ext: Edema no edema. Pulses 2+ Skin: warm and dry Neuro:  CNs 2-12 intact, no focal abnormalities noted  EKG:  Not recently done. In December poor R-wave progression V1 through V3       Signed, Darci NeedleHenry W. B. Jovonta Levit III, MD 12/20/2013 3:29 PM

## 2013-12-21 ENCOUNTER — Encounter: Payer: Self-pay | Admitting: Interventional Cardiology

## 2014-01-27 ENCOUNTER — Encounter: Payer: BC Managed Care – PPO | Admitting: Physician Assistant

## 2014-01-27 ENCOUNTER — Other Ambulatory Visit (HOSPITAL_COMMUNITY): Payer: BC Managed Care – PPO

## 2014-02-16 ENCOUNTER — Ambulatory Visit (INDEPENDENT_AMBULATORY_CARE_PROVIDER_SITE_OTHER): Payer: PRIVATE HEALTH INSURANCE | Admitting: Nurse Practitioner

## 2014-02-16 ENCOUNTER — Ambulatory Visit (HOSPITAL_COMMUNITY): Payer: PRIVATE HEALTH INSURANCE | Attending: Cardiovascular Disease | Admitting: Radiology

## 2014-02-16 ENCOUNTER — Encounter: Payer: Self-pay | Admitting: Nurse Practitioner

## 2014-02-16 VITALS — BP 142/91 | HR 81

## 2014-02-16 DIAGNOSIS — I469 Cardiac arrest, cause unspecified: Secondary | ICD-10-CM

## 2014-02-16 DIAGNOSIS — Z720 Tobacco use: Secondary | ICD-10-CM

## 2014-02-16 DIAGNOSIS — I509 Heart failure, unspecified: Secondary | ICD-10-CM | POA: Diagnosis present

## 2014-02-16 DIAGNOSIS — F17200 Nicotine dependence, unspecified, uncomplicated: Secondary | ICD-10-CM

## 2014-02-16 DIAGNOSIS — I420 Dilated cardiomyopathy: Secondary | ICD-10-CM

## 2014-02-16 DIAGNOSIS — I5022 Chronic systolic (congestive) heart failure: Secondary | ICD-10-CM

## 2014-02-16 DIAGNOSIS — R570 Cardiogenic shock: Secondary | ICD-10-CM

## 2014-02-16 DIAGNOSIS — I2510 Atherosclerotic heart disease of native coronary artery without angina pectoris: Secondary | ICD-10-CM | POA: Diagnosis present

## 2014-02-16 DIAGNOSIS — I255 Ischemic cardiomyopathy: Secondary | ICD-10-CM

## 2014-02-16 DIAGNOSIS — R079 Chest pain, unspecified: Secondary | ICD-10-CM

## 2014-02-16 NOTE — Progress Notes (Signed)
Echocardiogram performed.  

## 2014-02-16 NOTE — Progress Notes (Signed)
Exercise Treadmill Test  Pre-Exercise Testing Evaluation Rhythm: normal sinus  Rate: 67 bpm     Test  Exercise Tolerance Test Ordering MD: [name] PrimeHenry Smith, MD  Interpreting MD: [name] FredricksonLori Gerhardt, NP  Unique Test No: 1  Treadmill:  1  Indication for ETT: chest pain - rule out ischemia  Contraindication to ETT: No   Stress Modality: exercise - treadmill  Cardiac Imaging Performed: non   Protocol: standard Bruce - maximal  Max BP:  188/103  Max MPHR (bpm):  164 85% MPR (bpm):  139  MPHR obtained (bpm):  148 % MPHR obtained:  90%  Reached 85% MPHR (min:sec):  4:00 Total Exercise Time (min-sec):  5 minutes  Workload in METS:  7.0 Borg Scale: 17  Reason ETT Terminated:  patient's desire to stop    ST Segment Analysis At Rest: normal ST segments - no evidence of significant ST depression with abnormal resting EKG. With Exercise: ST depression and T wave changes in recovery noted inferior and laterally.  Other Information Arrhythmia:  Yes - occasional PVC, one couplet Angina during ETT:  absent (0) Quality of ETT:  diagnostic  ETT Interpretation:  Abnormal study.  Comments: Patient presents today for routine GXT. Has had prior MI with PCI, HLD and ongoing tobacco abuse. Currently with no symptoms reported. No medicines taken prior to today's study.    Today the patient exercised on the standard Bruce protocol for a total of 5 minutes.  Poor exercise tolerance.  Adequate blood pressure response.  Clinically negative for chest pain. Test was stopped due to shortness of breath and achievement of target HR.  EKG with late ST and T wave changes. No significant arrhythmia noted except for occasional PVC and one couplet.   Recommendations: Arrange for Lexiscan  CV risk factor modification with smoking cessation.   Echo results pending.  Patient is agreeable to this plan and will call if any problems develop in the interim.   [name] MacadamiaLori C. Gerhardt, RN, ANP-C [facility] [address] [place], [place]  [address] [phone]

## 2014-02-22 ENCOUNTER — Telehealth: Payer: Self-pay

## 2014-02-22 MED ORDER — VALSARTAN 80 MG PO TABS: 80.0000 mg | ORAL_TABLET | Freq: Every day | ORAL | Status: DC

## 2014-02-22 NOTE — Telephone Encounter (Signed)
pt aware of echo results and Dr.Smith instructions.Start diovan generic  80 mg daily.f/u 1 month Rx sent to pt pharmacy. f/u appt sch with Dr.Smith for 9/24 . pt verbalized understanding.

## 2014-02-22 NOTE — Telephone Encounter (Signed)
Message copied by Jarvis Newcomer on Tue Feb 22, 2014 10:22 AM ------      Message from: Verdis Prime      Created: Fri Feb 18, 2014  9:30 PM       Start diovan generic  80 mg daily. ------

## 2014-03-24 ENCOUNTER — Encounter: Payer: Self-pay | Admitting: Interventional Cardiology

## 2014-03-24 ENCOUNTER — Ambulatory Visit (INDEPENDENT_AMBULATORY_CARE_PROVIDER_SITE_OTHER): Payer: PRIVATE HEALTH INSURANCE | Admitting: Interventional Cardiology

## 2014-03-24 VITALS — BP 122/84 | HR 60 | Ht 73.0 in | Wt 217.0 lb

## 2014-03-24 DIAGNOSIS — I10 Essential (primary) hypertension: Secondary | ICD-10-CM

## 2014-03-24 DIAGNOSIS — I2589 Other forms of chronic ischemic heart disease: Secondary | ICD-10-CM

## 2014-03-24 DIAGNOSIS — I420 Dilated cardiomyopathy: Secondary | ICD-10-CM

## 2014-03-24 DIAGNOSIS — I82402 Acute embolism and thrombosis of unspecified deep veins of left lower extremity: Secondary | ICD-10-CM

## 2014-03-24 DIAGNOSIS — I255 Ischemic cardiomyopathy: Secondary | ICD-10-CM

## 2014-03-24 DIAGNOSIS — I5022 Chronic systolic (congestive) heart failure: Secondary | ICD-10-CM

## 2014-03-24 DIAGNOSIS — I82409 Acute embolism and thrombosis of unspecified deep veins of unspecified lower extremity: Secondary | ICD-10-CM

## 2014-03-24 MED ORDER — METOPROLOL TARTRATE 25 MG PO TABS: 25.0000 mg | ORAL_TABLET | Freq: Two times a day (BID) | ORAL | Status: DC

## 2014-03-24 NOTE — Progress Notes (Signed)
Patient ID: [name] Walker, male   DOB: 1955/09/15, 58 y.o.   MRN: [id number]    [address], [address] [place], [place]  [address] Phone: [phone] Fax:  [phone]  Date:  03/24/2014   ID:  [name] Walker, DOB 1955/07/03, MRN [id number]  PCP:  Default, Provider, MD   ASSESSMENT:  1. Chronic systolic heart failure, ischemic in origin, asymptomatic 2. Left lower extremity greater than right lower extremities swelling, marked varicosities, rule out DVT 3. Hypertension 4 pericardial artery disease without angina 5. Tobacco use  PLAN:  1. increase metoprolol to 25 mg twice a day 2. After 2 weeks on the increased dose of metoprolol increase Diovan to 160 mg per day 3. In 4 weeks return office visit with a basic metabolic panel 4. In 3-4 months if LV systolic function is still less than EF 30%, however refer him for consideration of defibrillator and ? resynchronization therapy 5. Left lower extremity venous Doppler study since we cannot arrange to have that done.   SUBJECTIVE: [name] Walker is a 58 y.o. male who has no complaints. He has not had any chest pain. He had exertional intolerance on the treadmill. No evidence of ischemia. Peak echo demonstrated EF 25-30%. The various reasons heart failure therapy had been decreased. He continues to smoke. We started Diovan 80 mg per day about 2 weeks ago and he is tolerating this okay. He is on low-dose beta blocker therapy that needs to be optimized.   Wt Readings from Last 3 Encounters:  03/24/14 217 lb (98.431 kg)  12/20/13 216 lb (97.977 kg)  06/02/13 216 lb (97.977 kg)     Past Medical History  Diagnosis Date  . Varicose veins   . No pertinent past medical history   . MI (myocardial infarction) 06/19/2012    Current Outpatient Prescriptions  Medication Sig Dispense Refill  . aspirin 81 MG tablet Take 81 mg by mouth daily.      Marland Kitchen atorvastatin (LIPITOR) 20 MG tablet Take 1 tablet (20 mg total) by mouth daily at 6 PM.  30 tablet   11  . clopidogrel (PLAVIX) 75 MG tablet Take 1 tablet (75 mg total) by mouth daily with breakfast.  30 tablet  11  . metoprolol tartrate (LOPRESSOR) 25 MG tablet Take 0.5 tablets (12.5 mg total) by mouth 2 (two) times daily.  30 tablet  11  . Pseudoeph-Doxylamine-DM-APAP (NYQUIL PO) Take 15 mLs by mouth as needed.       . valsartan (DIOVAN) 80 MG tablet Take 1 tablet (80 mg total) by mouth daily.  30 tablet  5   No current facility-administered medications for this visit.    Allergies:   No Known Allergies  Social History:  The patient  reports that he has been smoking Cigarettes.  He has been smoking about 0.50 packs per day. He does not have any smokeless tobacco history on file. He reports that he does not drink alcohol or use illicit drugs.   ROS:  Please see the history of present illness.    Continues to smoke heavily. He has noted swelling of the left lower extremity   All other systems reviewed and negative.   OBJECTIVE: VS:  BP 122/84  Pulse 60  Ht  (1.854 m)  Wt 217 lb (98.431 kg)  BMI 28.64 kg/m2  SpO2 95% Well nourished, well developed, in no acute distress, patient and her stated age HEENT: normal Neck: JVD flat. Carotid bruit absent  Cardiac:  normal  S1, S2; RRR; no murmur Lungs:  clear to auscultation bilaterally, no wheezing, rhonchi or rales Abd: soft, nontender, no hepatomegaly Ext: Edema 2-3+ left lower extremity edema with mild tenderness. Pulses 2+ and symmetric Skin: warm and dry Neuro:  CNs 2-12 intact, no focal abnormalities noted  EKG:  Not performed       Signed, Darci Needle III, MD 03/24/2014 4:30 PM

## 2014-03-24 NOTE — Patient Instructions (Addendum)
Your physician has recommended you make the following change in your medication:  1) INCREASE Metoprolol to  twice daily. An Rx has been sent to your pharmacy 2) In 2 weeks if Metoprolol is tolerated INCREASE Diovan to  daily   Your physician has requested that you have a lower extremity venous duplex. During this test, exercise and ultrasound are used to evaluate venous blood flow in the legs. Allow one hour for this exam. There are no restrictions or special instructions.  Your physician recommends that you return for lab work on 04/19/14  You have a follow up appointment scheduled for 04/19/14 @ 2pm

## 2014-03-29 ENCOUNTER — Ambulatory Visit (HOSPITAL_COMMUNITY): Payer: PRIVATE HEALTH INSURANCE | Attending: Interventional Cardiology | Admitting: Cardiology

## 2014-03-29 DIAGNOSIS — I10 Essential (primary) hypertension: Secondary | ICD-10-CM | POA: Insufficient documentation

## 2014-03-29 DIAGNOSIS — M79609 Pain in unspecified limb: Secondary | ICD-10-CM

## 2014-03-29 DIAGNOSIS — M7989 Other specified soft tissue disorders: Secondary | ICD-10-CM | POA: Diagnosis present

## 2014-03-29 DIAGNOSIS — F17200 Nicotine dependence, unspecified, uncomplicated: Secondary | ICD-10-CM | POA: Insufficient documentation

## 2014-03-29 DIAGNOSIS — M79605 Pain in left leg: Secondary | ICD-10-CM

## 2014-03-29 DIAGNOSIS — I2510 Atherosclerotic heart disease of native coronary artery without angina pectoris: Secondary | ICD-10-CM | POA: Diagnosis not present

## 2014-03-29 DIAGNOSIS — I8002 Phlebitis and thrombophlebitis of superficial vessels of left lower extremity: Secondary | ICD-10-CM

## 2014-03-29 DIAGNOSIS — I82402 Acute embolism and thrombosis of unspecified deep veins of left lower extremity: Secondary | ICD-10-CM

## 2014-03-29 DIAGNOSIS — I8000 Phlebitis and thrombophlebitis of superficial vessels of unspecified lower extremity: Secondary | ICD-10-CM

## 2014-03-29 NOTE — Progress Notes (Signed)
Left lower extremity venous duplex performed  Results given to Dr.Nishan patient advised to take anti-inflammatory and apply warm compress, patient should return in 6 weeks for follow up duplex

## 2014-04-01 ENCOUNTER — Telehealth: Payer: Self-pay

## 2014-04-01 DIAGNOSIS — I5022 Chronic systolic (congestive) heart failure: Secondary | ICD-10-CM

## 2014-04-01 DIAGNOSIS — I10 Essential (primary) hypertension: Secondary | ICD-10-CM

## 2014-04-01 NOTE — Telephone Encounter (Signed)
Message copied by Jarvis NewcomerPARRIS-GODLEY, Romano Stigger S on Fri Apr 01, 2014 10:48 AM ------      Message from: Verdis PrimeSMITH, HENRY      Created: Thu Mar 31, 2014  3:23 PM       Noted instruction by Dr. Eden EmmsNishan. See how it is going. ------

## 2014-04-01 NOTE — Telephone Encounter (Signed)
called to ck on pt lmtcb. 

## 2014-04-04 ENCOUNTER — Ambulatory Visit (HOSPITAL_COMMUNITY): Payer: PRIVATE HEALTH INSURANCE | Attending: Internal Medicine | Admitting: Radiology

## 2014-04-04 VITALS — BP 124/85 | HR 51 | Ht 73.0 in | Wt 219.0 lb

## 2014-04-04 DIAGNOSIS — I5022 Chronic systolic (congestive) heart failure: Secondary | ICD-10-CM

## 2014-04-04 DIAGNOSIS — E785 Hyperlipidemia, unspecified: Secondary | ICD-10-CM | POA: Insufficient documentation

## 2014-04-04 DIAGNOSIS — R079 Chest pain, unspecified: Secondary | ICD-10-CM

## 2014-04-04 DIAGNOSIS — I10 Essential (primary) hypertension: Secondary | ICD-10-CM | POA: Diagnosis not present

## 2014-04-04 DIAGNOSIS — I2510 Atherosclerotic heart disease of native coronary artery without angina pectoris: Secondary | ICD-10-CM

## 2014-04-04 DIAGNOSIS — Z720 Tobacco use: Secondary | ICD-10-CM

## 2014-04-04 MED ORDER — TECHNETIUM TC 99M SESTAMIBI GENERIC - CARDIOLITE
10.0000 | Freq: Once | INTRAVENOUS | Status: AC | PRN
  Administered 2014-04-04: 10 via INTRAVENOUS

## 2014-04-04 MED ORDER — REGADENOSON 0.4 MG/5ML IV SOLN
0.4000 mg | Freq: Once | INTRAVENOUS | Status: AC
  Administered 2014-04-04: 0.4 mg via INTRAVENOUS

## 2014-04-04 MED ORDER — TECHNETIUM TC 99M SESTAMIBI GENERIC - CARDIOLITE
30.0000 | Freq: Once | INTRAVENOUS | Status: AC | PRN
  Administered 2014-04-04: 30 via INTRAVENOUS

## 2014-04-04 NOTE — Progress Notes (Signed)
[facility] SITE 3 NUCLEAR MED [address] [place], [place] [address] [phone]    Cardiology Nuclear Med Study  Reita ClicheBobby Marina Walker is a 58 y.o. male     MRN : [id number]     DOB: [birth year]  Procedure Date: 04/04/2014  Nuclear Med Background Indication for Stress Test:  Evaluation for Ischemia, Stent Patency, and Assess LVF(recent Echo with low EF) History:  CAD/Stent, MI, and 01-2014 Echo: EF=25-30% Cardiac Risk Factors: Hypertension and Lipids  Symptoms:  None   Nuclear Pre-Procedure Caffeine/Decaff Intake:  None> 12 hrs NPO After: 6:00am   Lungs:  clear O2 Sat: 98% on room air. IV 0.9% NS with Angio Cath:  22g  IV Site: R Wrist x 1, tolerated well IV Started by:  Irean HongPatsy Rawlins Stuard, RN  Chest Size (in):  44 Cup Size: n/a  Height: 6\' 1"  (1.854 m)  Weight:  219 lb (99.338 kg)  BMI:  Body mass index is 28.9 kg/(m^2). Tech Comments:  Patient took Metoprolol this am. Irean HongPatsy Gurfateh Mcclain, RN.    Nuclear Med Study 1 or 2 day study: 1 day  Stress Test Type:  Treadmill/Lexiscan  Reading MD: N/A  Order Authorizing Provider:  Veatrice KellsHenry Smith,III, MD  Resting Radionuclide: Technetium 6835m Sestamibi  Resting Radionuclide Dose: 11.0 mCi   Stress Radionuclide:  Technetium 2035m Sestamibi  Stress Radionuclide Dose: 33.0 mCi           Stress Protocol Rest HR: 51 Stress HR: 93  Rest BP: 124/85 Stress BP: 149/95  Exercise Time (min): 2:00 METS: n/a           Dose of Adenosine (mg):  n/a Dose of Lexiscan: 0.4 mg  Dose of Atropine (mg): n/a Dose of Dobutamine: n/a mcg/kg/min (at max HR)  Stress Test Technologist: Irean HongPatsy Hira Trent, RN  Nuclear Technologist:  Jackquline BoschElzbieta Kubak,CNMT     Rest Procedure:  Myocardial perfusion imaging was performed at rest 45 minutes following the intravenous administration of Technetium 7835m Sestamibi. Rest ECG: Sinus bradycardia, lateral and anterior infarct, age undetermined.   Stress Procedure:  The patient received IV Lexiscan 0.4 mg over 15-seconds with  concurrent low level exercise and then Technetium 3935m Sestamibi was injected at 30-seconds while the patient continued walking one more minute.  The patient complained of chest tightness 6/10 with Lexiscan. Quantitative spect images were obtained after a 45-minute delay. Stress ECG: No significant change from baseline ECG  QPS Raw Data Images:  Normal; no motion artifact; normal heart/lung ratio. Stress Images:  There are two large perfusion defects: 1. mid and apical anterior walls, apical lateral wall, true apex, basal and mid anterospetal walls 2. entire inferior and basal and mid inferoseptal walls.  Rest Images:  There is large perfusion defect in two areas: 1. mid and apical anterior walls, apical lateral wall, true apex, basal and mid anterospetal walls 2. entire inferior and basal and mid inferoseptal walls.  Subtraction (SDS):  No evidence of ischemia. Transient Ischemic Dilatation (Normal <1.22):  1.01 Lung/Heart Ratio (Normal <0.45):  0.39  Quantitative Gated Spect Images QGS EDV:  265 ml QGS ESV:  205 ml  Impression Exercise Capacity:  Lexiscan with low level exercise. BP Response:  Normal blood pressure response. Clinical Symptoms:  Chest tightness.  ECG Impression:  No significant ST segment change suggestive of ischemia. Comparison with Prior Nuclear Study: No previous nuclear study performed  Overall Impression:  High risk stress nuclear study with two irreversible defects: 1. In the mid LAD post first septal perforator and 2.  In the RCA. No ischemia. If clinically indicated a viability study with cardiac MRI should be considered. .  LV Ejection Fraction: 23%.  LV Wall Motion:  Akinesis of all the apical segmets, mid anterior wall, entire inferior and basal and mid antero and inferolateral walls.   Lars Masson 04/04/2014

## 2014-04-05 MED ORDER — METOPROLOL TARTRATE 25 MG PO TABS
25.0000 mg | ORAL_TABLET | Freq: Two times a day (BID) | ORAL | Status: DC
Start: 2014-04-05 — End: 2014-12-29

## 2014-04-05 MED ORDER — VALSARTAN 160 MG PO TABS: 160.0000 mg | ORAL_TABLET | Freq: Every day | ORAL | Status: DC

## 2014-04-05 NOTE — Telephone Encounter (Signed)
Follow up ° ° ° ° ° °Returning Lisa's call °

## 2014-04-05 NOTE — Telephone Encounter (Signed)
Returned pt call. Pt sts that the le edema in his left leg has resolved.pt adv LE duplex is to be repeated in mid Nov. Pt adv per o/v with Dr.Smith Divan increase of 160mg  daily was to be started 2 wks after metoprolol increase. Rx sent to pt pharmacy. Pt to keep upcoming appt with Dr.Smith on 10/20. Pt verbalized understanding.

## 2014-04-07 ENCOUNTER — Encounter: Payer: Self-pay | Admitting: Interventional Cardiology

## 2014-04-07 ENCOUNTER — Telehealth: Payer: Self-pay

## 2014-04-07 DIAGNOSIS — R001 Bradycardia, unspecified: Secondary | ICD-10-CM | POA: Insufficient documentation

## 2014-04-07 NOTE — Telephone Encounter (Signed)
called to verify that pt has picked up and started Metoprolol and Diovan.lmtcb. pt to keep upcoming appt on 10/20 with Dr.Smith

## 2014-04-19 ENCOUNTER — Ambulatory Visit (INDEPENDENT_AMBULATORY_CARE_PROVIDER_SITE_OTHER): Payer: PRIVATE HEALTH INSURANCE | Admitting: Interventional Cardiology

## 2014-04-19 ENCOUNTER — Encounter: Payer: Self-pay | Admitting: Interventional Cardiology

## 2014-04-19 VITALS — BP 118/88 | HR 59 | Ht 73.0 in | Wt 219.4 lb

## 2014-04-19 DIAGNOSIS — I255 Ischemic cardiomyopathy: Secondary | ICD-10-CM

## 2014-04-19 DIAGNOSIS — I420 Dilated cardiomyopathy: Secondary | ICD-10-CM

## 2014-04-19 DIAGNOSIS — I2510 Atherosclerotic heart disease of native coronary artery without angina pectoris: Secondary | ICD-10-CM

## 2014-04-19 DIAGNOSIS — I10 Essential (primary) hypertension: Secondary | ICD-10-CM

## 2014-04-19 DIAGNOSIS — I5022 Chronic systolic (congestive) heart failure: Secondary | ICD-10-CM

## 2014-04-19 MED ORDER — CLOPIDOGREL BISULFATE 75 MG PO TABS: 75.0000 mg | ORAL_TABLET | Freq: Every day | ORAL | Status: DC

## 2014-04-19 MED ORDER — ATORVASTATIN CALCIUM 20 MG PO TABS: 20.0000 mg | ORAL_TABLET | Freq: Every day | ORAL | Status: DC

## 2014-04-19 MED ORDER — VALSARTAN-HYDROCHLOROTHIAZIDE 160-12.5 MG PO TABS: 1.0000 | ORAL_TABLET | Freq: Every day | ORAL | Status: DC

## 2014-04-19 NOTE — Progress Notes (Signed)
Patient ID: [name] Walker, male   DOB: 11-08-1955, 58 y.o.   MRN: [id number]    [address] [place], [place]  [address] Phone: [phone](336) [phone] Fax:  [phone]  Date:  04/19/2014   ID:  [name] Walker, DOB 11-08-1955, MRN [id number]  PCP:  Default, Provider, MD   ASSESSMENT:  1. Ischemic cardiomyopathy with chronic systolic heart failure status post anterior myocardial infarction, most recent EF less than 25% 2. CAD with recent nuclear study demonstrating anterior wall scar 3. Tobacco abuse continues 4. Superficial lower extremity phlebitis, resolved  PLAN:  1. change valsartan to valsartan HCTZ 160/12.5 mg 2. Clinical followup in 2 months 3. Basic metabolic panel to be done 7 days to 2 weeks after starting valsartan HCT 4. After 3 months of optimize heart failure therapy, he will have coronary angiography to document vessel patency and reassess LV function. 5. If LV function remains low, he will need to have AICD implantation   SUBJECTIVE: [name] Walker is a 58 y.o. male who is doing relatively well. He denies chest discomfort and symptoms of heart failure. Chronic dyspnea related to heavy tobacco abuse. He has not had palpitations or syncope. He is tolerated up titration of beta blocker and ARB therapy without complications. Nuclear study demonstrated a large region of apical anterior and inferior akinesis and scar. EF is less than 25%.   Wt Readings from Last 3 Encounters:  04/19/14 219 lb 6.4 oz (99.519 kg)  04/04/14 219 lb (99.338 kg)  03/24/14 217 lb (98.431 kg)     Past Medical History  Diagnosis Date  . Varicose veins   . No pertinent past medical history   . MI (myocardial infarction) 06/19/2012    Current Outpatient Prescriptions  Medication Sig Dispense Refill  . aspirin 81 MG tablet Take 81 mg by mouth daily.      Marland Kitchen. atorvastatin (LIPITOR) 20 MG tablet Take 1 tablet (20 mg total) by mouth daily at 6 PM.  30 tablet  11  . clopidogrel (PLAVIX) 75 MG  tablet Take 1 tablet (75 mg total) by mouth daily with breakfast.  30 tablet  11  . metoprolol tartrate (LOPRESSOR) 25 MG tablet Take 1 tablet (25 mg total) by mouth 2 (two) times daily.  60 tablet  11  . Pseudoeph-Doxylamine-DM-APAP (NYQUIL PO) Take 15 mLs by mouth as needed.       . valsartan (DIOVAN) 160 MG tablet Take 1 tablet (160 mg total) by mouth daily.  30 tablet  11   No current facility-administered medications for this visit.    Allergies:   No Known Allergies  Social History:  The patient  reports that he has been smoking Cigarettes.  He has been smoking about 0.50 packs per day. He does not have any smokeless tobacco history on file. He reports that he does not drink alcohol or use illicit drugs.   ROS:  Please see the history of present illness.   No syncope or lower extremity edema   All other systems reviewed and negative.   OBJECTIVE: VS:  BP 118/88  Pulse 59  Ht 6\' 1"  (1.854 m)  Wt 219 lb 6.4 oz (99.519 kg)  BMI 28.95 kg/m2  SpO2 96% Well nourished, well developed, in no acute distress, older than stated age HEENT: normal Neck: JVD flat. Carotid bruit absent  Cardiac:  normal S1, S2; RRR; no murmur Lungs:  clear to auscultation bilaterally, no wheezing, rhonchi or rales Abd: soft, nontender, no hepatomegaly Ext: Edema  left greater than right edema. Pulses 2+ Skin: warm and dry Neuro:  CNs 2-12 intact, no focal abnormalities noted  EKG:  Not repeated       Signed, Darci NeedleHenry W. B. Smith III, MD 04/19/2014 3:14 PM

## 2014-04-19 NOTE — Patient Instructions (Addendum)
Your physician has recommended you make the following change in your medication:  1) COMPLETE your supply of Valsartan 160mg  daily. 2) START Valsartan/HCTZ 160-12.5mg  daily. An Rx has been sent to your pharmacy  Your physician recommends that you return for lab work 1 week after starting Valsartan/HCTZ  Your physician has requested that you have a lower extremity venous duplex. During this test, exercise and ultrasound are used to evaluate venous blood flow in the legs. Allow one hour for this exam. There are no restrictions or special instructions.  Your physician recommends that you schedule a follow-up appointment in: 2 months

## 2014-04-20 ENCOUNTER — Other Ambulatory Visit (HOSPITAL_COMMUNITY): Payer: Self-pay | Admitting: *Deleted

## 2014-04-29 ENCOUNTER — Encounter (HOSPITAL_COMMUNITY): Payer: PRIVATE HEALTH INSURANCE

## 2014-05-03 ENCOUNTER — Ambulatory Visit (HOSPITAL_COMMUNITY): Payer: PRIVATE HEALTH INSURANCE | Attending: Interventional Cardiology | Admitting: *Deleted

## 2014-05-03 DIAGNOSIS — I2510 Atherosclerotic heart disease of native coronary artery without angina pectoris: Secondary | ICD-10-CM | POA: Insufficient documentation

## 2014-05-03 DIAGNOSIS — I10 Essential (primary) hypertension: Secondary | ICD-10-CM | POA: Insufficient documentation

## 2014-05-03 DIAGNOSIS — Z720 Tobacco use: Secondary | ICD-10-CM | POA: Diagnosis not present

## 2014-05-03 DIAGNOSIS — I82402 Acute embolism and thrombosis of unspecified deep veins of left lower extremity: Secondary | ICD-10-CM | POA: Insufficient documentation

## 2014-05-03 DIAGNOSIS — I82890 Acute embolism and thrombosis of other specified veins: Secondary | ICD-10-CM

## 2014-05-03 NOTE — Progress Notes (Signed)
Lower Extremity Venous Duplex Left Performed

## 2014-05-06 ENCOUNTER — Telehealth: Payer: Self-pay

## 2014-05-06 NOTE — Telephone Encounter (Signed)
pt aware lf LE ven dopp.No acute clot.pt verbalized understanding.

## 2014-05-06 NOTE — Telephone Encounter (Signed)
-----   Message from Lyn RecordsHenry W Smith III, MD sent at 05/04/2014  1:36 PM EST ----- No acute clot.

## 2014-06-09 ENCOUNTER — Encounter (HOSPITAL_COMMUNITY): Payer: Self-pay | Admitting: Interventional Cardiology

## 2014-06-13 ENCOUNTER — Other Ambulatory Visit (INDEPENDENT_AMBULATORY_CARE_PROVIDER_SITE_OTHER): Payer: PRIVATE HEALTH INSURANCE | Admitting: *Deleted

## 2014-06-13 DIAGNOSIS — I5022 Chronic systolic (congestive) heart failure: Secondary | ICD-10-CM

## 2014-06-13 LAB — BASIC METABOLIC PANEL
BUN: 14 mg/dL (ref 6–23)
CO2: 25 mEq/L (ref 19–32)
Calcium: 8.9 mg/dL (ref 8.4–10.5)
Chloride: 111 mEq/L (ref 96–112)
Creatinine, Ser: 0.9 mg/dL (ref 0.4–1.5)
GFR: 96.05 mL/min (ref >60.00)
Glucose, Bld: 84 mg/dL (ref 70–99)
Potassium: 3.8 mEq/L (ref 3.5–5.1)
Sodium: 140 mEq/L (ref 135–145)

## 2014-06-16 ENCOUNTER — Telehealth: Payer: Self-pay

## 2014-06-16 NOTE — Telephone Encounter (Signed)
-----   Message from Lesleigh NoeHenry W Smith III, MD sent at 06/15/2014  7:40 PM EST ----- Lab tests are normal

## 2014-06-16 NOTE — Telephone Encounter (Signed)
Pt aware of lab results.  Lab tests are normal  Pt verbalized understanding.

## 2014-12-28 ENCOUNTER — Encounter: Payer: Self-pay | Admitting: *Deleted

## 2014-12-28 NOTE — Progress Notes (Signed)
Cardiology Office Note   Date:  12/29/2014   ID:  [name] MayerBobby Dean Coplen, DOB [birth year], MRN [id number]  PCP:  Default, Provider, MD  Cardiologist:  Lesleigh NoeSMITH III,HENRY W, MD   Chief Complaint  Patient presents with  . Coronary Artery Disease      History of Present Illness: [name] Walker is a 59 y.o. male who presents for ischemic cardiomyopathy, CAD with bare-metal stent LAD, tobacco abuse, essential hypertension, and dyslipidemia.  He is doing relatively well. He is tolerated the am titration of his heart failure therapy without side effects. He denies orthopnea, PND, and syncope. He has had chest tightness went under a lot of emotional stress at work. He is able lie flat. Is no peripheral edema. He denies palpitations although he has frequent unexplained episodes of lightheadedness.   A nuclear study in October 2015 demonstrated an EF of 23% with akinesis of the anteroapical wall. No ischemia was noted. An echo in August demonstrated an EF of 25-30%. The medical regimen was optimized after these studies were obtained.   Past Medical History  Diagnosis Date  . Varicose veins   . No pertinent past medical history   . MI (myocardial infarction) 06/19/2012    Past Surgical History  Procedure Laterality Date  . Coronary stent placement    . Left heart catheterization with coronary angiogram Right 06/19/2012    Procedure: LEFT HEART CATHETERIZATION WITH CORONARY ANGIOGRAM;  Surgeon: Lesleigh NoeHenry W Smith III, MD;  [facility];  Service: Cardiovascular;  Laterality: Right;  . Percutaneous coronary stent intervention (pci-s) Right 06/19/2012    Procedure: PERCUTANEOUS CORONARY STENT INTERVENTION (PCI-S);  Surgeon: Lesleigh NoeHenry W Smith III, MD;  [facility];  Service: Cardiovascular;  Laterality: Right;     Current Outpatient Prescriptions  Medication Sig Dispense Refill  . aspirin 81 MG tablet Take 81 mg by mouth daily.    Marland Kitchen. atorvastatin (LIPITOR) 20 MG tablet Take 1 tablet  (20 mg total) by mouth daily at 6 PM. 30 tablet 11  . clopidogrel (PLAVIX) 75 MG tablet Take 1 tablet (75 mg total) by mouth daily with breakfast. 30 tablet 11  . metoprolol tartrate (LOPRESSOR) 25 MG tablet Take 1 tablet (25 mg total) by mouth 2 (two) times daily. 60 tablet 11  . PROAIR HFA 108 (90 BASE) MCG/ACT inhaler Inhale 1-2 puffs into the lungs every 4 (four) hours as needed. (WHEEZING AND SHORTNESS OF BREATH)  0  . Pseudoeph-Doxylamine-DM-APAP (NYQUIL PO) Take 15 mLs by mouth as needed (COUGH).     Marland Kitchen. tamsulosin (FLOMAX) 0.4 MG CAPS capsule Take 0.4 mg by mouth daily.  3  . valsartan-hydrochlorothiazide (DIOVAN HCT) 160-12.5 MG per tablet Take 1 tablet by mouth daily. 30 tablet 11   No current facility-administered medications for this visit.    Allergies:   Review of patient's allergies indicates no known allergies.    Social History:  The patient  reports that he has been smoking Cigarettes.  He has been smoking about 0.50 packs per day. He has never used smokeless tobacco. He reports that he does not drink alcohol or use illicit drugs.   Family History:  The patient's family history includes Cancer in his father; Heart attack in his brother; Heart murmur in his mother.    ROS:  Please see the history of present illness.   Otherwise, review of systems are positive for under significant work stress. Some difficulty sleeping. He denies syncope and angina. When he is under mental  stress at work he has occasional chest tightness. Still smoking cigarettes..   All other systems are reviewed and negative.    PHYSICAL EXAM: VS:  BP 108/72 mmHg  Pulse 57  Ht 6' (1.829 m)  Wt 97.886 kg (215 lb 12.8 oz)  BMI 29.26 kg/m2 , BMI Body mass index is 29.26 kg/(m^2). GEN: Well nourished, well developed, in no acute distress HEENT: normal Neck: no JVD, carotid bruits, or masses Cardiac: RRR; no murmurs, rubs, or gallops,no edema  Respiratory:  clear to auscultation bilaterally, normal work of  breathing GI: soft, nontender, nondistended, + BS MS: no deformity or atrophy Skin: warm and dry, no rash Neuro:  Strength and sensation are intact Psych: euthymic mood, full affect   EKG:  EKG is ordered today. The ekg ordered today demonstrates sinus bradycardia with evidence of septal infarct.   Recent Labs: 06/13/2014: BUN 14; Creatinine, Ser 0.9; Potassium 3.8; Sodium 140    Lipid Panel    Component Value Date/Time   CHOL 149 06/09/2013 1129   TRIG 108.0 06/09/2013 1129   HDL 44.20 06/09/2013 1129   CHOLHDL 3 06/09/2013 1129   VLDL 21.6 06/09/2013 1129   LDLCALC 83 06/09/2013 1129      Wt Readings from Last 3 Encounters:  12/29/14 97.886 kg (215 lb 12.8 oz)  04/19/14 99.519 kg (219 lb 6.4 oz)  04/04/14 99.338 kg (219 lb)      Other studies Reviewed: Additional studies/ records that were reviewed today include: . Review of the above records demonstrates:    ASSESSMENT AND PLAN:  1. Chronic systolic heart failure On medical therapy including moderate beta blocker and angiotensin receptor blocker therapy.  2. Atherosclerosis of native coronary artery of native heart without angina pectoris No angina or ischemia by nuclear scintigraphy within the past 12 months  3. Essential hypertension, benign Low normal  4. Tobacco abuse Continues  5. Old MI (myocardial infarction) Anterior infarction  6. Dyslipidemia Not recently tested    Current medicines are reviewed at length with the patient today.  The patient has concerns regarding medicines.  The following changes have been made:  He is concerned about the number of virus that he is working in the stress involved at work.  Plan change metoprolol 25 mg twice a day 2 carvedilol 12.5 mg twice a day  He needs coronary angiography with left ventriculography. Now that he is on a significant medical regimen for heart failure, if LV function remains low, he will be referred for ASCVD therapy.  Labs/ tests  ordered today include:  No orders of the defined types were placed in this encounter.     Disposition:   FU with HS in 6 weeks  Signed, Lesleigh NoeSMITH III,HENRY W, MD  12/29/2014 2:34 PM    [facility] [address], [place], [place]  [address] Phone: [phone](336) [phone]; Fax: [phone]

## 2014-12-29 ENCOUNTER — Encounter: Payer: Self-pay | Admitting: Interventional Cardiology

## 2014-12-29 ENCOUNTER — Ambulatory Visit (INDEPENDENT_AMBULATORY_CARE_PROVIDER_SITE_OTHER): Payer: PRIVATE HEALTH INSURANCE | Admitting: Interventional Cardiology

## 2014-12-29 VITALS — BP 108/72 | HR 57 | Ht 72.0 in | Wt 215.8 lb

## 2014-12-29 DIAGNOSIS — I2510 Atherosclerotic heart disease of native coronary artery without angina pectoris: Secondary | ICD-10-CM | POA: Diagnosis not present

## 2014-12-29 DIAGNOSIS — I5022 Chronic systolic (congestive) heart failure: Secondary | ICD-10-CM

## 2014-12-29 DIAGNOSIS — Z01812 Encounter for preprocedural laboratory examination: Secondary | ICD-10-CM

## 2014-12-29 DIAGNOSIS — E785 Hyperlipidemia, unspecified: Secondary | ICD-10-CM

## 2014-12-29 DIAGNOSIS — Z720 Tobacco use: Secondary | ICD-10-CM

## 2014-12-29 DIAGNOSIS — I252 Old myocardial infarction: Secondary | ICD-10-CM

## 2014-12-29 DIAGNOSIS — I10 Essential (primary) hypertension: Secondary | ICD-10-CM

## 2014-12-29 MED ORDER — CARVEDILOL 12.5 MG PO TABS: 12.5000 mg | ORAL_TABLET | Freq: Two times a day (BID) | ORAL | Status: DC

## 2014-12-29 NOTE — Patient Instructions (Signed)
Medication Instructions:  Your physician has recommended you make the following change in your medication:  COMPLETE your supply of Metoprolol THEN START Carvedilol 12.5mg  Twice daily. An Rx has been sent to your pharmacy   Labwork: Your physician recommends that you return for lab work on 01/06/15 between 7:30am-5:15pm   Testing/Procedures Your physician has requested that you have a cardiac catheterization. Cardiac catheterization is used to diagnose and/or treat various heart conditions. Doctors may recommend this procedure for a number of different reasons. The most common reason is to evaluate chest pain. Chest pain can be a symptom of coronary artery disease (CAD), and cardiac catheterization can show whether plaque is narrowing or blocking your heart's arteries. This procedure is also used to evaluate the valves, as well as measure the blood flow and oxygen levels in different parts of your heart. For further information please visit [email or web address]. Please follow instruction sheet, as given.    Follow-Up: Your physician recommends that you schedule a follow-up appointment 03/02/15 @ 2:30pm   Any Other Special Instructions Will Be Listed Below (If Applicable).

## 2015-01-04 ENCOUNTER — Other Ambulatory Visit: Payer: Self-pay | Admitting: Urology

## 2015-01-04 DIAGNOSIS — R310 Gross hematuria: Secondary | ICD-10-CM

## 2015-01-06 ENCOUNTER — Ambulatory Visit
Admission: RE | Admit: 2015-01-06 | Discharge: 2015-01-06 | Disposition: A | Discharge status: Home / Self Care | Payer: PRIVATE HEALTH INSURANCE | Source: Ambulatory Visit | Attending: Urology | Admitting: Urology

## 2015-01-06 ENCOUNTER — Other Ambulatory Visit (INDEPENDENT_AMBULATORY_CARE_PROVIDER_SITE_OTHER): Payer: PRIVATE HEALTH INSURANCE | Admitting: *Deleted

## 2015-01-06 ENCOUNTER — Ambulatory Visit
Admission: RE | Admit: 2015-01-06 | Discharge: 2015-01-06 | Disposition: A | Discharge status: Home / Self Care | Payer: PRIVATE HEALTH INSURANCE | Source: Ambulatory Visit | Attending: Interventional Cardiology | Admitting: Interventional Cardiology

## 2015-01-06 DIAGNOSIS — N132 Hydronephrosis with renal and ureteral calculous obstruction: Secondary | ICD-10-CM | POA: Diagnosis not present

## 2015-01-06 DIAGNOSIS — Z01812 Encounter for preprocedural laboratory examination: Secondary | ICD-10-CM | POA: Diagnosis not present

## 2015-01-06 DIAGNOSIS — Z720 Tobacco use: Secondary | ICD-10-CM

## 2015-01-06 DIAGNOSIS — R312 Other microscopic hematuria: Secondary | ICD-10-CM | POA: Diagnosis not present

## 2015-01-06 DIAGNOSIS — R109 Unspecified abdominal pain: Secondary | ICD-10-CM | POA: Insufficient documentation

## 2015-01-06 DIAGNOSIS — R319 Hematuria, unspecified: Secondary | ICD-10-CM | POA: Diagnosis present

## 2015-01-06 DIAGNOSIS — R310 Gross hematuria: Secondary | ICD-10-CM

## 2015-01-06 LAB — CBC WITH DIFFERENTIAL/PLATELET
Basophils Absolute: 0 10*3/uL (ref 0.0–0.1)
Basophils Relative: 0.5 % (ref 0.0–3.0)
Eosinophils Absolute: 0.1 10*3/uL (ref 0.0–0.7)
Eosinophils Relative: 1.3 % (ref 0.0–5.0)
HCT: 42.4 % (ref 39.0–52.0)
Hemoglobin: 14.1 g/dL (ref 13.0–17.0)
Lymphocytes Relative: 30.6 % (ref 12.0–46.0)
Lymphs Abs: 2.1 10*3/uL (ref 0.7–4.0)
MCHC: 33.2 g/dL (ref 30.0–36.0)
MCV: 97.1 fl (ref 78.0–100.0)
Monocytes Absolute: 0.5 10*3/uL (ref 0.1–1.0)
Monocytes Relative: 7.1 % (ref 3.0–12.0)
Neutro Abs: 4.2 10*3/uL (ref 1.4–7.7)
Neutrophils Relative %: 60.5 % (ref 43.0–77.0)
Platelets: 153 10*3/uL (ref 150.0–400.0)
RBC: 4.37 Mil/uL (ref 4.22–5.81)
RDW: 13.4 % (ref 11.5–15.5)
WBC: 6.9 10*3/uL (ref 4.0–10.5)

## 2015-01-06 LAB — BASIC METABOLIC PANEL
BUN: 15 mg/dL (ref 6–23)
CO2: 30 mEq/L (ref 19–32)
Calcium: 9.3 mg/dL (ref 8.4–10.5)
Chloride: 107 mEq/L (ref 96–112)
Creatinine, Ser: 0.95 mg/dL (ref 0.40–1.50)
GFR: 86.6 mL/min (ref >60.00)
Glucose, Bld: 89 mg/dL (ref 70–99)
Potassium: 3.6 mEq/L (ref 3.5–5.1)
Sodium: 142 mEq/L (ref 135–145)

## 2015-01-06 LAB — PROTIME-INR
INR: 1 ratio (ref 0.8–1.0)
Prothrombin Time: 11 s (ref 9.6–13.1)

## 2015-01-06 MED ORDER — IOHEXOL 350 MG/ML SOLN
125.0000 mL | Freq: Once | INTRAVENOUS | Status: AC | PRN
  Administered 2015-01-06: 150 mL via INTRAVENOUS

## 2015-01-09 ENCOUNTER — Ambulatory Visit (HOSPITAL_COMMUNITY)
Admission: RE | Admit: 2015-01-09 | Discharge: 2015-01-09 | Disposition: A | Discharge status: Home / Self Care | Payer: PRIVATE HEALTH INSURANCE | Source: Ambulatory Visit | Attending: Interventional Cardiology | Admitting: Interventional Cardiology

## 2015-01-09 ENCOUNTER — Encounter (HOSPITAL_COMMUNITY)
Admission: RE | Disposition: A | Discharge status: Home / Self Care | Payer: Self-pay | Source: Ambulatory Visit | Attending: Interventional Cardiology

## 2015-01-09 DIAGNOSIS — I2510 Atherosclerotic heart disease of native coronary artery without angina pectoris: Secondary | ICD-10-CM | POA: Diagnosis not present

## 2015-01-09 DIAGNOSIS — I255 Ischemic cardiomyopathy: Secondary | ICD-10-CM | POA: Diagnosis not present

## 2015-01-09 DIAGNOSIS — I5022 Chronic systolic (congestive) heart failure: Secondary | ICD-10-CM | POA: Diagnosis present

## 2015-01-09 DIAGNOSIS — I252 Old myocardial infarction: Secondary | ICD-10-CM

## 2015-01-09 DIAGNOSIS — Z955 Presence of coronary angioplasty implant and graft: Secondary | ICD-10-CM | POA: Diagnosis not present

## 2015-01-09 DIAGNOSIS — Z720 Tobacco use: Secondary | ICD-10-CM | POA: Diagnosis present

## 2015-01-09 DIAGNOSIS — F17210 Nicotine dependence, cigarettes, uncomplicated: Secondary | ICD-10-CM | POA: Diagnosis not present

## 2015-01-09 DIAGNOSIS — Z7902 Long term (current) use of antithrombotics/antiplatelets: Secondary | ICD-10-CM | POA: Diagnosis not present

## 2015-01-09 DIAGNOSIS — I10 Essential (primary) hypertension: Secondary | ICD-10-CM | POA: Diagnosis present

## 2015-01-09 DIAGNOSIS — Z7982 Long term (current) use of aspirin: Secondary | ICD-10-CM | POA: Insufficient documentation

## 2015-01-09 DIAGNOSIS — E785 Hyperlipidemia, unspecified: Secondary | ICD-10-CM | POA: Insufficient documentation

## 2015-01-09 DIAGNOSIS — I8390 Asymptomatic varicose veins of unspecified lower extremity: Secondary | ICD-10-CM | POA: Diagnosis not present

## 2015-01-09 DIAGNOSIS — R079 Chest pain, unspecified: Secondary | ICD-10-CM | POA: Diagnosis present

## 2015-01-09 HISTORY — PX: CARDIAC CATHETERIZATION: SHX172

## 2015-01-09 LAB — BASIC METABOLIC PANEL
Anion gap: 7 (ref 5–15)
BUN: 12 mg/dL (ref 6–20)
CO2: 24 mmol/L (ref 22–32)
Calcium: 9.3 mg/dL (ref 8.9–10.3)
Chloride: 108 mmol/L (ref 101–111)
Creatinine, Ser: 0.9 mg/dL (ref 0.61–1.24)
GFR calc Af Amer: >60 mL/min (ref >60)
GFR calc non Af Amer: >60 mL/min (ref >60)
Glucose, Bld: 105 mg/dL — ABNORMAL HIGH (ref 65–99)
Potassium: 4.2 mmol/L (ref 3.5–5.1)
Sodium: 139 mmol/L (ref 135–145)

## 2015-01-09 SURGERY — LEFT HEART CATH AND CORONARY ANGIOGRAPHY
Anesthesia: LOCAL

## 2015-01-09 MED ORDER — LIDOCAINE HCL (PF) 1 % IJ SOLN
INTRAMUSCULAR | Status: AC
  Filled 2015-01-09: qty 30

## 2015-01-09 MED ORDER — IOHEXOL 350 MG/ML SOLN
INTRAVENOUS | Status: DC | PRN
  Administered 2015-01-09: 95 mL via INTRA_ARTERIAL

## 2015-01-09 MED ORDER — ONDANSETRON HCL 4 MG/2ML IJ SOLN: 4.0000 mg | Freq: Four times a day (QID) | INTRAMUSCULAR | Status: DC | PRN

## 2015-01-09 MED ORDER — HEPARIN (PORCINE) IN NACL 2-0.9 UNIT/ML-% IJ SOLN
INTRAMUSCULAR | Status: DC | PRN
  Administered 2015-01-09: 10:00:00

## 2015-01-09 MED ORDER — FENTANYL CITRATE (PF) 100 MCG/2ML IJ SOLN
INTRAMUSCULAR | Status: AC
  Filled 2015-01-09: qty 2

## 2015-01-09 MED ORDER — ACETAMINOPHEN 325 MG PO TABS: 650.0000 mg | ORAL_TABLET | ORAL | Status: DC | PRN

## 2015-01-09 MED ORDER — HEPARIN SODIUM (PORCINE) 1000 UNIT/ML IJ SOLN
INTRAMUSCULAR | Status: DC | PRN
  Administered 2015-01-09: 5000 [IU] via INTRAVENOUS

## 2015-01-09 MED ORDER — OXYCODONE-ACETAMINOPHEN 5-325 MG PO TABS: 1.0000 | ORAL_TABLET | ORAL | Status: DC | PRN

## 2015-01-09 MED ORDER — MIDAZOLAM HCL 2 MG/2ML IJ SOLN
INTRAMUSCULAR | Status: DC | PRN
  Administered 2015-01-09 (×2): 1 mg via INTRAVENOUS

## 2015-01-09 MED ORDER — SODIUM CHLORIDE 0.9 % WEIGHT BASED INFUSION: 1.0000 mL/kg/h | INTRAVENOUS | Status: AC

## 2015-01-09 MED ORDER — MIDAZOLAM HCL 2 MG/2ML IJ SOLN
INTRAMUSCULAR | Status: AC
  Filled 2015-01-09: qty 2

## 2015-01-09 MED ORDER — SODIUM CHLORIDE 0.9 % IJ SOLN: 3.0000 mL | INTRAMUSCULAR | Status: DC | PRN

## 2015-01-09 MED ORDER — HEPARIN (PORCINE) IN NACL 2-0.9 UNIT/ML-% IJ SOLN
INTRAMUSCULAR | Status: AC
  Filled 2015-01-09: qty 1000

## 2015-01-09 MED ORDER — SODIUM CHLORIDE 0.9 % IV SOLN: 250.0000 mL | INTRAVENOUS | Status: DC | PRN

## 2015-01-09 MED ORDER — SODIUM CHLORIDE 0.9 % WEIGHT BASED INFUSION
3.0000 mL/kg/h | INTRAVENOUS | Status: AC
  Administered 2015-01-09: 3 mL/kg/h via INTRAVENOUS

## 2015-01-09 MED ORDER — SODIUM CHLORIDE 0.9 % IJ SOLN
INTRAMUSCULAR | Status: DC | PRN
  Administered 2015-01-09: 10:00:00 via INTRA_ARTERIAL

## 2015-01-09 MED ORDER — FENTANYL CITRATE (PF) 100 MCG/2ML IJ SOLN
INTRAMUSCULAR | Status: DC | PRN
  Administered 2015-01-09: 50 ug via INTRAVENOUS

## 2015-01-09 MED ORDER — SODIUM CHLORIDE 0.9 % IJ SOLN: 3.0000 mL | Freq: Two times a day (BID) | INTRAMUSCULAR | Status: DC

## 2015-01-09 MED ORDER — HEPARIN (PORCINE) IN NACL 2-0.9 UNIT/ML-% IJ SOLN: INTRAMUSCULAR | Status: DC | PRN

## 2015-01-09 MED ORDER — HEPARIN SODIUM (PORCINE) 1000 UNIT/ML IJ SOLN
INTRAMUSCULAR | Status: AC
  Filled 2015-01-09: qty 1

## 2015-01-09 MED ORDER — SODIUM CHLORIDE 0.9 % WEIGHT BASED INFUSION: 1.0000 mL/kg/h | INTRAVENOUS | Status: DC

## 2015-01-09 MED ORDER — VERAPAMIL HCL 2.5 MG/ML IV SOLN
INTRAVENOUS | Status: AC
  Filled 2015-01-09: qty 2

## 2015-01-09 MED ORDER — ASPIRIN 81 MG PO CHEW
81.0000 mg | CHEWABLE_TABLET | ORAL | Status: AC
  Administered 2015-01-09: 81 mg via ORAL

## 2015-01-09 MED ORDER — LIDOCAINE HCL (PF) 1 % IJ SOLN
INTRAMUSCULAR | Status: DC | PRN
Start: 2015-01-09 — End: 2015-01-09
  Administered 2015-01-09: 5 mL via INTRADERMAL

## 2015-01-09 MED ORDER — ASPIRIN 81 MG PO CHEW
CHEWABLE_TABLET | ORAL | Status: AC
  Filled 2015-01-09: qty 1

## 2015-01-09 SURGICAL SUPPLY — 10 items
CATH INFINITI 5 FR JL3.5 (CATHETERS) ×2 IMPLANT
CATH INFINITI 5FR ANG PIGTAIL (CATHETERS) ×2 IMPLANT
CATH INFINITI JR4 5F (CATHETERS) ×2 IMPLANT
DEVICE RAD COMP TR BAND LRG (VASCULAR PRODUCTS) ×2 IMPLANT
GLIDESHEATH SLEND A-KIT 6F 22G (SHEATH) ×2 IMPLANT
KIT HEART LEFT (KITS) ×2 IMPLANT
PACK CARDIAC CATHETERIZATION (CUSTOM PROCEDURE TRAY) ×2 IMPLANT
TRANSDUCER W/STOPCOCK (MISCELLANEOUS) ×2 IMPLANT
TUBING CIL FLEX 10 FLL-RA (TUBING) ×2 IMPLANT
WIRE SAFE-T 1.5MM-J .035X260CM (WIRE) ×2 IMPLANT

## 2015-01-09 NOTE — Interval H&P Note (Signed)
Cath Lab Visit (complete for each Cath Lab visit)  Clinical Evaluation Leading to the Procedure:   ACS: No.  Non-ACS:    Anginal Classification: CCS III  Anti-ischemic medical therapy: Maximal Therapy (2 or more classes of medications)  Non-Invasive Test Results: No non-invasive testing performed  Prior CABG: No previous CABG      History and Physical Interval Note:  01/09/2015 7:24 AM  [name] Walker  has presented today for surgery, with the diagnosis of ischemic cardiomyopathy  The various methods of treatment have been discussed with the patient and family. After consideration of risks, benefits and other options for treatment, the patient has consented to  Procedure(s): Left Heart Cath and Coronary Angiography (N/A) as a surgical intervention .  The patient's history has been reviewed, patient examined, no change in status, stable for surgery.  I have reviewed the patient's chart and labs.  Questions were answered to the patient's satisfaction.     Lesleigh NoeSMITH III,HENRY W

## 2015-01-09 NOTE — H&P (View-Only) (Signed)
Cardiology Office Note   Date:  12/29/2014   ID:  [name] MayerBobby Dean Coplen, DOB [birth year], MRN [id number]  PCP:  Default, Provider, MD  Cardiologist:  Lesleigh NoeSMITH III,Sunita Demond W, MD   Chief Complaint  Patient presents with  . Coronary Artery Disease      History of Present Illness: [name] Walker is a 59 y.o. male who presents for ischemic cardiomyopathy, CAD with bare-metal stent LAD, tobacco abuse, essential hypertension, and dyslipidemia.  He is doing relatively well. He is tolerated the am titration of his heart failure therapy without side effects. He denies orthopnea, PND, and syncope. He has had chest tightness went under a lot of emotional stress at work. He is able lie flat. Is no peripheral edema. He denies palpitations although he has frequent unexplained episodes of lightheadedness.   A nuclear study in October 2015 demonstrated an EF of 23% with akinesis of the anteroapical wall. No ischemia was noted. An echo in August demonstrated an EF of 25-30%. The medical regimen was optimized after these studies were obtained.   Past Medical History  Diagnosis Date  . Varicose veins   . No pertinent past medical history   . MI (myocardial infarction) 06/19/2012    Past Surgical History  Procedure Laterality Date  . Coronary stent placement    . Left heart catheterization with coronary angiogram Right 06/19/2012    Procedure: LEFT HEART CATHETERIZATION WITH CORONARY ANGIOGRAM;  Surgeon: Lesleigh NoeHenry W Aizen Duval III, MD;  [facility];  Service: Cardiovascular;  Laterality: Right;  . Percutaneous coronary stent intervention (pci-s) Right 06/19/2012    Procedure: PERCUTANEOUS CORONARY STENT INTERVENTION (PCI-S);  Surgeon: Lesleigh NoeHenry W Shunte Senseney III, MD;  [facility];  Service: Cardiovascular;  Laterality: Right;     Current Outpatient Prescriptions  Medication Sig Dispense Refill  . aspirin 81 MG tablet Take 81 mg by mouth daily.    Marland Kitchen. atorvastatin (LIPITOR) 20 MG tablet Take 1 tablet  (20 mg total) by mouth daily at 6 PM. 30 tablet 11  . clopidogrel (PLAVIX) 75 MG tablet Take 1 tablet (75 mg total) by mouth daily with breakfast. 30 tablet 11  . metoprolol tartrate (LOPRESSOR) 25 MG tablet Take 1 tablet (25 mg total) by mouth 2 (two) times daily. 60 tablet 11  . PROAIR HFA 108 (90 BASE) MCG/ACT inhaler Inhale 1-2 puffs into the lungs every 4 (four) hours as needed. (WHEEZING AND SHORTNESS OF BREATH)  0  . Pseudoeph-Doxylamine-DM-APAP (NYQUIL PO) Take 15 mLs by mouth as needed (COUGH).     Marland Kitchen. tamsulosin (FLOMAX) 0.4 MG CAPS capsule Take 0.4 mg by mouth daily.  3  . valsartan-hydrochlorothiazide (DIOVAN HCT) 160-12.5 MG per tablet Take 1 tablet by mouth daily. 30 tablet 11   No current facility-administered medications for this visit.    Allergies:   Review of patient's allergies indicates no known allergies.    Social History:  The patient  reports that he has been smoking Cigarettes.  He has been smoking about 0.50 packs per day. He has never used smokeless tobacco. He reports that he does not drink alcohol or use illicit drugs.   Family History:  The patient's family history includes Cancer in his father; Heart attack in his brother; Heart murmur in his mother.    ROS:  Please see the history of present illness.   Otherwise, review of systems are positive for under significant work stress. Some difficulty sleeping. He denies syncope and angina. When he is under mental  stress at work he has occasional chest tightness. Still smoking cigarettes..   All other systems are reviewed and negative.    PHYSICAL EXAM: VS:  BP 108/72 mmHg  Pulse 57  Ht 6' (1.829 m)  Wt 97.886 kg (215 lb 12.8 oz)  BMI 29.26 kg/m2 , BMI Body mass index is 29.26 kg/(m^2). GEN: Well nourished, well developed, in no acute distress HEENT: normal Neck: no JVD, carotid bruits, or masses Cardiac: RRR; no murmurs, rubs, or gallops,no edema  Respiratory:  clear to auscultation bilaterally, normal work of  breathing GI: soft, nontender, nondistended, + BS MS: no deformity or atrophy Skin: warm and dry, no rash Neuro:  Strength and sensation are intact Psych: euthymic mood, full affect   EKG:  EKG is ordered today. The ekg ordered today demonstrates sinus bradycardia with evidence of septal infarct.   Recent Labs: 06/13/2014: BUN 14; Creatinine, Ser 0.9; Potassium 3.8; Sodium 140    Lipid Panel    Component Value Date/Time   CHOL 149 06/09/2013 1129   TRIG 108.0 06/09/2013 1129   HDL 44.20 06/09/2013 1129   CHOLHDL 3 06/09/2013 1129   VLDL 21.6 06/09/2013 1129   LDLCALC 83 06/09/2013 1129      Wt Readings from Last 3 Encounters:  12/29/14 97.886 kg (215 lb 12.8 oz)  04/19/14 99.519 kg (219 lb 6.4 oz)  04/04/14 99.338 kg (219 lb)      Other studies Reviewed: Additional studies/ records that were reviewed today include: . Review of the above records demonstrates:    ASSESSMENT AND PLAN:  1. Chronic systolic heart failure On medical therapy including moderate beta blocker and angiotensin receptor blocker therapy.  2. Atherosclerosis of native coronary artery of native heart without angina pectoris No angina or ischemia by nuclear scintigraphy within the past 12 months  3. Essential hypertension, benign Low normal  4. Tobacco abuse Continues  5. Old MI (myocardial infarction) Anterior infarction  6. Dyslipidemia Not recently tested    Current medicines are reviewed at length with the patient today.  The patient has concerns regarding medicines.  The following changes have been made:  He is concerned about the number of virus that he is working in the stress involved at work.  Plan change metoprolol 25 mg twice a day 2 carvedilol 12.5 mg twice a day  He needs coronary angiography with left ventriculography. Now that he is on a significant medical regimen for heart failure, if LV function remains low, he will be referred for ASCVD therapy.  Labs/ tests  ordered today include:  No orders of the defined types were placed in this encounter.     Disposition:   FU with HS in 6 weeks  Signed, Lesleigh NoeSMITH III,Makendra Vigeant W, MD  12/29/2014 2:34 PM    [facility] [address], [place], [place]  [address] Phone: [phone](336) [phone]; Fax: [phone]

## 2015-01-09 NOTE — Discharge Instructions (Signed)
Radial Site Care °Refer to this sheet in the next few weeks. These instructions provide you with information on caring for yourself after your procedure. Your caregiver may also give you more specific instructions. Your treatment has been planned according to current medical practices, but problems sometimes occur. Call your caregiver if you have any problems or questions after your procedure. °HOME CARE INSTRUCTIONS °· You may shower the day after the procedure. Remove the bandage (dressing) and gently wash the site with plain soap and water. Gently pat the site dry. °· Do not apply powder or lotion to the site. °· Do not submerge the affected site in water for 3 to 5 days. °· Inspect the site at least twice daily. °· Do not flex or bend the affected arm for 24 hours. °· No lifting over 5 pounds (2.3 kg) for 5 days after your procedure. °· Do not drive home if you are discharged the same day of the procedure. Have someone else drive you. °· You may drive 24 hours after the procedure unless otherwise instructed by your caregiver. °· Do not operate machinery or power tools for 24 hours. °· A responsible adult should be with you for the first 24 hours after you arrive home. °What to expect: °· Any bruising will usually fade within 1 to 2 weeks. °· Blood that collects in the tissue (hematoma) may be painful to the touch. It should usually decrease in size and tenderness within 1 to 2 weeks. °SEEK IMMEDIATE MEDICAL CARE IF: °· You have unusual pain at the radial site. °· You have redness, warmth, swelling, or pain at the radial site. °· You have drainage (other than a small amount of blood on the dressing). °· You have chills. °· You have a fever or persistent symptoms for more than 72 hours. °· You have a fever and your symptoms suddenly get worse. °· Your arm becomes pale, cool, tingly, or numb. °· You have heavy bleeding from the site. Hold pressure on the site. CALL 911 °Document Released: 07/20/2010 Document  Revised: 09/09/2011 Document Reviewed: 07/20/2010 °ExitCare® Patient Information ©2015 [employer]. This information is not intended to replace advice given to you by your health care provider. Make sure you discuss any questions you have with your health care provider. ° °

## 2015-01-10 ENCOUNTER — Telehealth: Payer: Self-pay

## 2015-01-10 ENCOUNTER — Encounter (HOSPITAL_COMMUNITY): Payer: Self-pay | Admitting: Interventional Cardiology

## 2015-01-10 NOTE — Telephone Encounter (Signed)
-----   Message from Lyn RecordsHenry W Smith, MD sent at 01/08/2015  9:22 AM EDT ----- COPD, otherwise okay.

## 2015-01-10 NOTE — Telephone Encounter (Signed)
Pt aware of lab and cxr results. Lab results- ok cxr results-  COPD, otherwise okay.  Per Dr.Smith pt need ref to EP for ICD consideration. Adv pt a scheduler from our office will call him to schedule. Pt verbalized understanding.

## 2015-01-18 ENCOUNTER — Encounter: Payer: Self-pay | Admitting: Internal Medicine

## 2015-01-18 ENCOUNTER — Ambulatory Visit (INDEPENDENT_AMBULATORY_CARE_PROVIDER_SITE_OTHER): Payer: PRIVATE HEALTH INSURANCE | Admitting: Internal Medicine

## 2015-01-18 VITALS — BP 124/74 | HR 65 | Ht 72.0 in | Wt 210.0 lb

## 2015-01-18 DIAGNOSIS — R0989 Other specified symptoms and signs involving the circulatory and respiratory systems: Secondary | ICD-10-CM

## 2015-01-18 DIAGNOSIS — R931 Abnormal findings on diagnostic imaging of heart and coronary circulation: Secondary | ICD-10-CM

## 2015-01-18 DIAGNOSIS — I739 Peripheral vascular disease, unspecified: Secondary | ICD-10-CM

## 2015-01-18 DIAGNOSIS — I5022 Chronic systolic (congestive) heart failure: Secondary | ICD-10-CM

## 2015-01-18 NOTE — Patient Instructions (Addendum)
Medication Instructions:  Your physician recommends that you continue on your current medications as directed. Please refer to the Current Medication list given to you today.  Labwork: None ordered  Testing/Procedures: Your physician has requested that you have an ankle brachial index (ABI). During this test an ultrasound and blood pressure cuff are used to evaluate the arteries that supply the arms and legs with blood. Allow thirty minutes for this exam. There are no restrictions or special instructions.  Your physician has recommended that you have a home sleep study. This test records several body functions during sleep, including: brain activity, eye movement, oxygen and carbon dioxide blood levels, heart rate and rhythm, breathing rate and rhythm, the flow of air through your mouth and nose, snoring, body muscle movements, and chest and belly movement.  [name] will contact you to arrange this home study.  Follow-Up: To be determined after scheduling defibrillator.  Any Other Special Instructions Will Be Listed Below (If Applicable). We will check into subcutaneous defibrillator approval through your insurance.  We will contact you once we have heard back from them on this matter.

## 2015-01-18 NOTE — Progress Notes (Signed)
ELECTROPHYSIOLOGY CONSULT NOTE  Patient ID: [name] Walker, MRN: [id number], DOB/AGE: [birth year] 58 y.o. Admit date: (Not on file) Date of Consult: 01/18/2015  Primary Physician: Barbette Reichmann, MD Primary Cardiologist: [facility]  Chief Complaint: ICD consideration   HPI [name] Walker is a 59 y.o. male  Referred for consideration of an ICD He has ischemic heart disease. He had an anterior wall MI 2013 and underwent stenting at that time. He has had repeated evaluations LV function to be notably depressed. Most recently he underwent catheterization 7/16 Demonstrating  Severe left ventricular dysfunction with anterior wall akinesis/dyskinesis starting in the anterobasal and extending into the apical segment. Ejection fraction is 20-25%.  Minimal luminal irregularities in the proximal LAD and proximal to mid circumflex. Coronary arteries otherwise normal.  Widely patent proximal LAD stent.  He has modest shortness of breath with exertion. He has mild peripheral edema  He does not have nocturnal dyspnea orthopnea.  He has had some palpitations which occur in the context of metoprolol withdrawal. He had not resumed. He denies a history of syncope.  He has calf claudication with ambulation.  His sleep disordered breathing and daytime somnolence.  He has some degrees of orthostatic intolerance with abrupt standing.   Past Medical History  Diagnosis Date  . Varicose veins   . No pertinent past medical history   . MI (myocardial infarction) 06/19/2012      Surgical History:  Past Surgical History  Procedure Laterality Date  . Coronary stent placement    . Left heart catheterization with coronary angiogram Right 06/19/2012    Procedure: LEFT HEART CATHETERIZATION WITH CORONARY ANGIOGRAM;  Surgeon: Lesleigh Noe, MD;  Location: [facility] CATH LAB;  Service: Cardiovascular;  Laterality: Right;  . Percutaneous coronary stent intervention (pci-s) Right 06/19/2012    Procedure:  PERCUTANEOUS CORONARY STENT INTERVENTION (PCI-S);  Surgeon: Lesleigh Noe, MD;  Location: [facility] CATH LAB;  Service: Cardiovascular;  Laterality: Right;  . Cardiac catheterization N/A 01/09/2015    Procedure: Left Heart Cath and Coronary Angiography;  Surgeon: Lyn Records, MD;  Location: [facility] INVASIVE CV LAB;  Service: Cardiovascular;  Laterality: N/A;     Home Meds: Prior to Admission medications   Medication Sig Start Date End Date Taking? Authorizing Provider  albuterol (PROVENTIL HFA;VENTOLIN HFA) 108 (90 BASE) MCG/ACT inhaler Inhale 2 puffs into the lungs every 6 (six) hours as needed for wheezing or shortness of breath (chest tightness). ProAir   Yes Historical Provider, MD  atorvastatin (LIPITOR) 20 MG tablet Take 1 tablet (20 mg total) by mouth daily at 6 PM. Patient taking differently: Take 20 mg by mouth at bedtime.  04/19/14  Yes Lyn Records, MD  carvedilol (COREG) 12.5 MG tablet Take 1 tablet (12.5 mg total) by mouth 2 (two) times daily. 12/29/14  Yes Lyn Records, MD  clopidogrel (PLAVIX) 75 MG tablet Take 1 tablet (75 mg total) by mouth daily with breakfast. 04/19/14  Yes Lyn Records, MD  metoprolol tartrate (LOPRESSOR) 25 MG tablet Take 25 mg by mouth 2 (two) times daily. 12/03/14  Yes Historical Provider, MD  tamsulosin (FLOMAX) 0.4 MG CAPS capsule Take 0.4 mg by mouth at bedtime.  10/19/14  Yes Historical Provider, MD  valsartan (DIOVAN) 160 MG tablet Take 160 mg by mouth at bedtime.  12/03/14  Yes Historical Provider, MD  valsartan-hydrochlorothiazide (DIOVAN HCT) 160-12.5 MG per tablet Take 1 tablet by mouth daily. 04/19/14  Yes Lyn Records, MD  Allergies: No Known Allergies  History   Social History  . Marital Status: Single    Spouse Name: N/A  . Number of Children: N/A  . Years of Education: N/A   Occupational History  . Not on file.   Social History Main Topics  . Smoking status: Current Every Day Smoker -- 0.50 packs/day    Types: Cigarettes  .  Smokeless tobacco: Never Used  . Alcohol Use: No  . Drug Use: No  . Sexual Activity: Not on file   Other Topics Concern  . Not on file   Social History Narrative     Family History  Problem Relation Age of Onset  . Cancer Father   . Heart murmur Mother   . Heart attack Brother      ROS:  Please see the history of present illness.     All other systems reviewed and negative.    Physical Exam: Blood pressure 124/74, pulse 65, height 6' (1.829 m), weight 210 lb (95.255 kg). General: Well developed, well nourished male in no acute distress. Head: Normocephalic, atraumatic, sclera non-icteric, no xanthomas, nares are without discharge. EENT: normal Lymph Nodes:  none Back: without scoliosis/kyphosis , no CVA tendersness Neck: Negative for carotid bruits. JVD not elevated. Lungs: Clear bilaterally to auscultation without wheezes, rales, or rhonchi. Breathing is unlabored. Heart: RRR with S1 S2. No  murmur , rubs, or gallops appreciated. Abdomen: Soft, non-tender, non-distended with normoactive bowel sounds. No hepatomegaly. No rebound/guarding. No obvious abdominal masses. Msk:  Strength and tone appear normal for age. Extremities: No clubbing or cyanosis. Nod edema.  Distal pedal pulses are 2+ and equal bilaterally. Skin: Warm and Dry Neuro: Alert and oriented X 3. CN III-XII intact Grossly normal sensory and motor function . Psych:  Responds to questions appropriately with a normal affect.      Labs: Cardiac Enzymes No results for input(s): CKTOTAL, CKMB, TROPONINI in the last 72 hours. CBC Lab Results  Component Value Date   WBC 6.9 01/06/2015   HGB 14.1 01/06/2015   HCT 42.4 01/06/2015   MCV 97.1 01/06/2015   PLT 153.0 01/06/2015   PROTIME: No results for input(s): LABPROT, INR in the last 72 hours. Chemistry No results for input(s): NA, K, CL, CO2, BUN, CREATININE, CALCIUM, PROT, BILITOT, ALKPHOS, ALT, AST, GLUCOSE in the last 168 hours.  Invalid input(s):  LABALBU Lipids Lab Results  Component Value Date   CHOL 149 06/09/2013   HDL 44.20 06/09/2013   LDLCALC 83 06/09/2013   TRIG 108.0 06/09/2013   BNP PRO B NATRIURETIC PEPTIDE (BNP)  Date/Time Value Ref Range Status  06/24/2012 02:49 PM 6692.0* 0 - 125 pg/mL Final   Thyroid Function Tests: No results for input(s): TSH, T4TOTAL, T3FREE, THYROIDAB in the last 72 hours.  Invalid input(s): FREET3    Miscellaneous No results found for: DDIMER  Radiology/Studies:  Ct Abdomen Pelvis W Wo Contrast  01/06/2015   CLINICAL DATA:  Right flank pain since 01/02/2015. Microscopic hematuria.  EXAM: CT ABDOMEN AND PELVIS WITHOUT AND WITH CONTRAST  TECHNIQUE: Multidetector CT imaging of the abdomen and pelvis was performed following the standard protocol before and following the bolus administration of intravenous contrast.  CONTRAST:  OMNIPAQUE IOHEXOL 350 MG/ML SOLN  COMPARISON:  None.  FINDINGS: Lower chest: Airway thickening in both lower lobes with mild lower lobe subsegmental atelectasis.  Hepatobiliary: Unremarkable  Pancreas: Unremarkable  Spleen: Unremarkable  Adrenals/Urinary Tract: 2.1 by 1.6 cm right collecting system calculus with indistinct stranding along  the collecting system, mild hydronephrosis, and wall thickening in the proximal ureter with mild proximal periureteral stranding. The stone partially extends into the right UPJ.  A hypodense 2.5 by 2.0 cm exophytic right kidney upper pole lesion laterally on image 33 series 12 appears to increase 15 Hounsfield units between portal venous and delayed phase images, indeterminate for true enhancement.  There is a separate 1.3 cm right kidney upper pole cyst which does not enhance.  Several tiny bilateral renal hypodense lesions are technically too small to characterize, although statistically likely to be cysts.  Stomach/Bowel: Sigmoid diverticulosis.  Vascular/Lymphatic: Aortoiliac atherosclerotic vascular disease. Borderline enlarged porta  hepatis lymph node at 1 cm short axis, image 19 series 4.  Reproductive: Unremarkable  Other: No supplemental non-categorized findings.  Musculoskeletal: Bilateral inguinal hernias contain adipose tissue.  IMPRESSION: 1. Large (2.1 by 1.6 cm) right UPJ and collecting system calculus, with associated right hydronephrosis, stranding around the collecting system, and adjacent proximal ureteral wall thickening which is likely inflammatory. I suspect that the stone is intermittently obstructing but also significantly irritating the collecting system and proximal ureter. 2. Indeterminate right kidney upper pole hypodense lesion with 15 Hounsfield units of total enhancement. This is in the indeterminate range in could be from pseudo enhancement or very faint internal enhancement. This could be further assessed and better quantified with renal protocol MRI with and without contrast in the nonurgent setting, if clinically warranted. 3. Airway thickening is present, suggesting bronchitis or reactive airways disease. 4. Other imaging findings of potential clinical significance: Sigmoid diverticulosis ; aortoiliac atherosclerotic vascular disease ; bilateral inguinal hernias containing adipose tissue.   Electronically Signed   By: Gaylyn RongWalter  Liebkemann M.D.   On: 01/06/2015 16:32   Dg Chest 2 View  01/06/2015   CLINICAL DATA:  History of Coronary artery disease with MI and stent placement, preoperative exam prior to repeat heart cath, current smoker  EXAM: CHEST  2 VIEW  COMPARISON:  Chest x-ray of July 20, 2012  FINDINGS: The lungs are mildly hyperinflated with hemidiaphragm flattening. The interstitial markings are coarse bilaterally but stable. The heart and pulmonary vascularity are normal. The mediastinum is normal in width. There is no pleural effusion. The bony thorax is unremarkable.  IMPRESSION: COPD.  There is no CHF nor other acute cardiopulmonary abnormality.   Electronically Signed   By: David  SwazilandJordan M.D.   On:  01/06/2015 13:36    EKG: Was done 6/16 demonstrated sinus rhythm. Interval 17/09/42+ axis LXXIV     Assessment and Plan:    ischemic cardiomyopathy  Congestive heart failure-chronic-class II  Sleep disordered breathing and daytime somnolence  Claudication  Cigarette abuse  He is on guideline directed medical therapy has persistent left ventricular dysfunction despite this. He is appropriately considered for primary prevention ICD further reduction of risk of sudden cardiac death.  Have reviewed the potential benefits and risks of ICD implantation including but not limited to death, perforation of heart or lung, lead dislodgement, infection,  device malfunction and inappropriate shocks.  The patient expresses understanding  and are willing to proceed.     We have discussed the relative benefits and merits of transvenous versus subcutaneous ICD implantation.  The advantages of the former including battery longevity, history derived from use in randomized controlled trials and perhaps a somewhat lower rate of inappropriate ICD discharges. Advantages of the latter  include the fact that it is extravascular,  Resulting different implications of device infection and it being non-transvalvular.    He  would like to be considered for the S ICD. We will have to check with his insurance.  I recommended a sleep study.  I've recommended ABIs.  I've encouraged him to follow through on his metoprolol--carvedilol change albeit at 6.25 mg twice daily initially.     Sherryl Manges

## 2015-01-22 ENCOUNTER — Encounter: Payer: Self-pay | Admitting: Interventional Cardiology

## 2015-01-22 DIAGNOSIS — I739 Peripheral vascular disease, unspecified: Secondary | ICD-10-CM | POA: Insufficient documentation

## 2015-01-25 ENCOUNTER — Other Ambulatory Visit: Payer: Self-pay | Admitting: Urology

## 2015-01-25 ENCOUNTER — Ambulatory Visit (HOSPITAL_COMMUNITY): Payer: PRIVATE HEALTH INSURANCE | Attending: Cardiology

## 2015-01-25 ENCOUNTER — Telehealth: Payer: Self-pay | Admitting: Interventional Cardiology

## 2015-01-25 DIAGNOSIS — I739 Peripheral vascular disease, unspecified: Secondary | ICD-10-CM | POA: Diagnosis not present

## 2015-01-25 DIAGNOSIS — N281 Cyst of kidney, acquired: Secondary | ICD-10-CM

## 2015-01-25 NOTE — Telephone Encounter (Signed)
Walk in pt form- pt needs note concerning Heart/including no more than 8 hour days-gave to [employer]

## 2015-01-30 ENCOUNTER — Telehealth: Payer: Self-pay

## 2015-01-30 ENCOUNTER — Encounter: Payer: Self-pay | Admitting: Interventional Cardiology

## 2015-01-30 NOTE — Telephone Encounter (Signed)
Pt aware the letter he requested will be left at the front desk for pick up. Pt appreciative and verbalized understanding

## 2015-01-30 NOTE — Telephone Encounter (Signed)
[name] Walker should be restricted from working more than 8 hours per day because of his significant underlying heart condition.

## 2015-01-30 NOTE — Telephone Encounter (Signed)
Pt walked in to the office on 01/25/15 to request a letter for work. Pt is requesting a letter that states he should work no more than 8 hour a day, due to his health and heart problems. Message route to Dr.Smith to advise

## 2015-02-02 ENCOUNTER — Ambulatory Visit
Admission: RE | Admit: 2015-02-02 | Discharge: 2015-02-02 | Disposition: A | Discharge status: Home / Self Care | Payer: PRIVATE HEALTH INSURANCE | Source: Ambulatory Visit | Attending: Urology | Admitting: Urology

## 2015-02-02 DIAGNOSIS — N200 Calculus of kidney: Secondary | ICD-10-CM | POA: Diagnosis not present

## 2015-02-02 DIAGNOSIS — N2889 Other specified disorders of kidney and ureter: Secondary | ICD-10-CM | POA: Insufficient documentation

## 2015-02-02 DIAGNOSIS — R935 Abnormal findings on diagnostic imaging of other abdominal regions, including retroperitoneum: Secondary | ICD-10-CM | POA: Diagnosis present

## 2015-02-02 DIAGNOSIS — Z09 Encounter for follow-up examination after completed treatment for conditions other than malignant neoplasm: Secondary | ICD-10-CM | POA: Diagnosis not present

## 2015-02-02 DIAGNOSIS — N289 Disorder of kidney and ureter, unspecified: Secondary | ICD-10-CM | POA: Diagnosis present

## 2015-02-02 DIAGNOSIS — N281 Cyst of kidney, acquired: Secondary | ICD-10-CM

## 2015-02-02 MED ORDER — GADOBENATE DIMEGLUMINE 529 MG/ML IV SOLN
20.0000 mL | Freq: Once | INTRAVENOUS | Status: AC | PRN
  Administered 2015-02-02: 20 mL via INTRAVENOUS

## 2015-02-15 ENCOUNTER — Inpatient Hospital Stay
Admission: RE | Admit: 2015-02-15 | Discharge: 2015-02-15 | Disposition: A | Discharge status: Home / Self Care | Payer: PRIVATE HEALTH INSURANCE | Source: Ambulatory Visit

## 2015-02-15 ENCOUNTER — Telehealth: Payer: Self-pay | Admitting: Interventional Cardiology

## 2015-02-15 HISTORY — DX: Chronic kidney disease, unspecified: N18.9

## 2015-02-15 HISTORY — DX: Atherosclerotic heart disease of native coronary artery without angina pectoris: I25.10

## 2015-02-15 HISTORY — DX: Essential (primary) hypertension: I10

## 2015-02-15 HISTORY — DX: Sleep apnea, unspecified: G47.30

## 2015-02-15 NOTE — Telephone Encounter (Signed)
It is okay to hold Plavix.

## 2015-02-15 NOTE — Pre-Procedure Instructions (Signed)
Dr Verdis Prime with [facility] medical and Dr Evelene Croon notified regarding cardiac clearance

## 2015-02-15 NOTE — Patient Instructions (Signed)
  Your procedure is scheduled on: 02/21/15 Tues  Report to Day Surgery. To find out your arrival time please call [phone] between 1PM - 3PM on 02/20/15 Mon.  Remember: Instructions that are not followed completely may result in serious medical risk, up to and including death, or upon the discretion of your surgeon and anesthesiologist your surgery may need to be rescheduled.    __x__ 1. Do not eat food or drink liquids after midnight. No gum chewing or hard candies.     ____ 2. No Alcohol for 24 hours before or after surgery.   ____ 3. Bring all medications with you on the day of surgery if instructed.    __x__ 4. Notify your doctor if there is any change in your medical condition     (cold, fever, infections).     Do not wear jewelry, make-up, hairpins, clips or nail polish.  Do not wear lotions, powders, or perfumes. You may wear deodorant.  Do not shave 48 hours prior to surgery. Men may shave face and neck.  Do not bring valuables to the hospital.    [facility] is not responsible for any belongings or valuables.               Contacts, dentures or bridgework may not be worn into surgery.  Leave your suitcase in the car. After surgery it may be brought to your room.  For patients admitted to the hospital, discharge time is determined by your                treatment team.   Patients discharged the day of surgery will not be allowed to drive home.   Please read over the following fact sheets that you were given:      __x__ Take these medicines the morning of surgery with A SIP OF WATER:    1.  albuterol (PROVENTIL HFA;VENTOLIN HFA) 108 (90 BASE) MCG/ACT inhaler  2. carvedilol (COREG) 12.5 MG tablet  3. metoprolol tartrate (LOPRESSOR) 25 MG tablet  4.  5.  6.  ____ Fleet Enema (as directed)   ____ Use CHG Soap as directed  ____ Use inhalers on the day of surgery  ____ Stop metformin 2 days prior to surgery    ____ Take 1/2 of usual insulin dose the night before  surgery and none on the morning of surgery.   __x__ Stop Coumadin/Plavix/aspirin on Check with cardiology to see if OK to stop plavix 1 week before surgery  ____ Stop Anti-inflammatories on    ____ Stop supplements until after surgery.    ____ Bring C-Pap to the hospital.

## 2015-02-15 NOTE — Telephone Encounter (Signed)
Request fwd to Dr.Smith to advise

## 2015-02-15 NOTE — Telephone Encounter (Signed)
New message     Request for surgical clearance:  1. What type of surgery is being performed? Systo and stent placement on right side  2. When is this surgery scheduled? 02-21-15  3. Are there any medications that need to be held prior to surgery and how long?Plavix 5-7 days  4. Name of physician performing surgery? Dr Joycelyn Das  5. What is your office phone and fax number? [facility] [phone]   Fax [phone]  Clearance request is also being faxed

## 2015-02-16 NOTE — Telephone Encounter (Signed)
Clearance placed in MR nurse fax box to be faxed to Dr.Wolfe's office

## 2015-02-17 NOTE — H&P (Signed)
NAMEDIDIER, BRANDENBURG NO.:  [id number]  MEDICAL RECORD NO.:  [id number]  LOCATION:  PERIO                        FACILITY:  [facility]  PHYSICIAN:  Anola Gurney          DATE OF BIRTH:  07-01-1956  DATE OF ADMISSION:  02/09/2015 DATE OF DISCHARGE:                            HISTORY AND PHYSICAL   CHIEF COMPLAINT:  Kidney stone.  HISTORY OF PRESENT ILLNESS:  Mr. Incorvaia is a 59 year old Caucasian male with gross hematuria and right flank pain, found to be secondary to a 2.1 x 1.6 cm right UPJ stone.  He comes in now for stent placement with subsequent lithotripsy.  PAST MEDICAL HISTORY:  No drug allergies.  CURRENT MEDICATIONS:  Albuterol, atorvastatin, carvedilol, clopidogrel, metoprolol, tamsulosin, valsartan, aspirin, Percocet, and Viagra.  PREVIOUS SURGICAL PROCEDURES:  Included coronary stent placement in 2013.  SOCIAL HISTORY:  The patient smokes half a pack a day and  has a greater than 30-pack year history.  He consumes alcohol rarely.  FAMILY HISTORY:  Unremarkable for heart disease, renal disease, or kidney stones.  PAST AND CURRENT MEDICAL CONDITIONS: 1. Coronary artery disease. 2. Kidney stones. 3. Erectile dysfunction.  REVIEW OF SYSTEMS:  The patient denied chest pain, shortness of breath, diabetes, stroke.  PHYSICAL EXAMINATION:  GENERAL:  Well-nourished white male in no acute distress. HEENT:  Sclerae were clear. NECK:  Supple.  No palpable cervical adenopathy. LUNGS:  Clear to auscultation. CARDIOVASCULAR:  Regular rhythm and rate without audible murmurs. ABDOMEN:  Soft, nontender abdomen. GU:  Circumcised testes, smooth, nontender. RECTAL:  30 g smooth, nontender prostate. NEUROMUSCULAR:  Grossly intact.  IMPRESSION: 1. Right nephrolithiasis with hematuria. 2. Erectile dysfunction.  PLAN:  Cystoscopy with right stent placement.          ______________________________ Anola Gurney     MW/MEDQ  D:  02/17/2015  T:   02/17/2015  Job:  [id number]

## 2015-02-20 ENCOUNTER — Telehealth: Payer: Self-pay

## 2015-02-20 NOTE — Telephone Encounter (Signed)
Cleared for surgery 

## 2015-02-20 NOTE — Telephone Encounter (Signed)
Spoke with karen @ [facility], pt is scheduled for sx tomorrow with Dr.Wolff. They are requesting written cardiac clearance to proceed with pt cytoscopy scheduled on 02/21/15 They were faxed an ok to hold plavix for 5-7 days per Dr.Smith. Adv Clydie Braun that Dr.Smith is out of town until after 8/24. They may have to reschedule pt' sx. Advised her I will fwd Dr.Smith a message that he can address the cardiac clearance rqst when he returns.

## 2015-02-20 NOTE — Pre-Procedure Instructions (Signed)
Dr Sherilyn Cooter [facility] office called several times for cardiac clearance. Dr Katrinka Blazing and Dr Maisie Fus out of office.  "No copy of clearance scanned in our computer." per Misty Stanley at Dr Corky Sing office. Instructed by Dr Evelene Croon to notify patient that surgery was cancelled and to notify the office when he obtained cardiac clearance.  Patient called notified as instructed.

## 2015-02-21 NOTE — Telephone Encounter (Signed)
Cardiac clearance faxed to Dr.Wolfe's office fax # [phone]

## 2015-03-02 ENCOUNTER — Ambulatory Visit (INDEPENDENT_AMBULATORY_CARE_PROVIDER_SITE_OTHER): Payer: PRIVATE HEALTH INSURANCE | Admitting: Interventional Cardiology

## 2015-03-02 ENCOUNTER — Encounter: Payer: Self-pay | Admitting: Interventional Cardiology

## 2015-03-02 VITALS — BP 90/62 | HR 61 | Ht 72.0 in | Wt 210.0 lb

## 2015-03-02 DIAGNOSIS — I10 Essential (primary) hypertension: Secondary | ICD-10-CM

## 2015-03-02 DIAGNOSIS — I2510 Atherosclerotic heart disease of native coronary artery without angina pectoris: Secondary | ICD-10-CM

## 2015-03-02 DIAGNOSIS — E785 Hyperlipidemia, unspecified: Secondary | ICD-10-CM

## 2015-03-02 DIAGNOSIS — N200 Calculus of kidney: Secondary | ICD-10-CM

## 2015-03-02 DIAGNOSIS — I214 Non-ST elevation (NSTEMI) myocardial infarction: Secondary | ICD-10-CM

## 2015-03-02 DIAGNOSIS — I5022 Chronic systolic (congestive) heart failure: Secondary | ICD-10-CM | POA: Diagnosis not present

## 2015-03-02 DIAGNOSIS — I739 Peripheral vascular disease, unspecified: Secondary | ICD-10-CM | POA: Diagnosis not present

## 2015-03-02 DIAGNOSIS — Z720 Tobacco use: Secondary | ICD-10-CM

## 2015-03-02 HISTORY — DX: Non-ST elevation (NSTEMI) myocardial infarction: I21.4

## 2015-03-02 NOTE — Patient Instructions (Signed)
Medication Instructions:  STOP Plavix RESUME Aspirin  daily when your GI physician feels it is safe to do so  Labwork: None orderd  Testing/Procedures: None ordered  Follow-Up: Your physician recommends that you schedule a follow-up appointment in: 3 months with Dr.Smith   Any Other Special Instructions Will Be Listed Below (If Applicable).

## 2015-03-02 NOTE — Progress Notes (Signed)
Cardiology Office Note   Date:  03/02/2015   ID:  [name], DOB 1956/06/27, MRN [id number]  PCP:  [name] Reichmann, MD  Cardiologist:  [name] Noe, MD   Chief Complaint  Patient presents with  . Coronary Artery Disease      History of Present Illness: [name] Walker is a 59 y.o. male who presents for anterior myocardial infarction, ischemic cardiomyopathy with EF less than 35%, chronic systolic heart failure, functional class II, tobacco use, hypertension, and sleep apnea.  [name] Walker is doing well. Has had no cardiopulmonary complaints. He has a patent left anterior descending stent documented in July. LVEF however is very  low at 30%. He has been referred to electrophysiology, Dr. Graciela Walker. They're considering AICD therapy. He has recently completed a sleep study. Results are not yet available. The patient is off Plavix because an intercurrent kidney stone is requiring urologic procedures to be performed. He is scheduled to have a stent and lithotripsy performed later this week.    Past Medical History  Diagnosis Date  . Varicose veins   . No pertinent past medical history   . Hypertension   . Chronic kidney disease     stone  . Sleep apnea   . Sleep apnea     to have sleep study  . Coronary artery disease   . MI (myocardial infarction) 06/19/2012    being considered for defibilator    Past Surgical History  Procedure Laterality Date  . Coronary stent placement    . Left heart catheterization with coronary angiogram Right 06/19/2012    Procedure: LEFT HEART CATHETERIZATION WITH CORONARY ANGIOGRAM;  Surgeon: [name] Noe, MD;  Location: [facility] CATH LAB;  Service: Cardiovascular;  Laterality: Right;  . Percutaneous coronary stent intervention (pci-s) Right 06/19/2012    Procedure: PERCUTANEOUS CORONARY STENT INTERVENTION (PCI-S);  Surgeon: [name] Noe, MD;  Location: [facility] CATH LAB;  Service: Cardiovascular;  Laterality: Right;  . Cardiac catheterization N/A  01/09/2015    Procedure: Left Heart Cath and Coronary Angiography;  Surgeon: [name] Records, MD;  Location: [facility] INVASIVE CV LAB;  Service: Cardiovascular;  Laterality: N/A;     Current Outpatient Prescriptions  Medication Sig Dispense Refill  . albuterol (PROVENTIL HFA;VENTOLIN HFA) 108 (90 BASE) MCG/ACT inhaler Inhale 2 puffs into the lungs every 6 (six) hours as needed for wheezing or shortness of breath (chest tightness). ProAir    . atorvastatin (LIPITOR) 20 MG tablet Take 20 mg by mouth daily.    . carvedilol (COREG) 12.5 MG tablet Take 12.5 mg by mouth 2 (two) times daily with a meal.    . clopidogrel (PLAVIX) 75 MG tablet Take 1 tablet (75 mg total) by mouth daily with breakfast. 30 tablet 11  . oxyCODONE-acetaminophen (PERCOCET) 10-325 MG per tablet Take 1 tablet by mouth every 6 (six) hours as needed. for pain  0  . tamsulosin (FLOMAX) 0.4 MG CAPS capsule Take 0.4 mg by mouth at bedtime.   3  . valsartan-hydrochlorothiazide (DIOVAN-HCT) 160-12.5 MG per tablet Take 1 tablet by mouth daily.     No current facility-administered medications for this visit.    Allergies:   Review of patient's allergies indicates no known allergies.    Social History:  The patient  reports that he has been smoking Cigarettes.  He has a 10 pack-year smoking history. He has never used smokeless tobacco. He reports that he drinks alcohol. He reports that he does not use illicit drugs.  Family History:  The patient's family history includes Cancer in his father; Heart attack in his brother; Heart murmur in his mother.    ROS:  Please see the history of present illness.   Otherwise, review of systems are positive for hematuria, right flank discomfort, continued tobacco use..   All other systems are reviewed and negative.    PHYSICAL EXAM: VS:  BP 90/62 mmHg  Pulse 61  Ht 6' (1.829 m)  Wt 95.255 kg (210 lb)  BMI 28.47 kg/m2  SpO2 91% , BMI Body mass index is 28.47 kg/(m^2). GEN: Well nourished, well  developed, in no acute distress. Smells heavily of tobacco smoke. HEENT: normal Neck: no JVD, carotid bruits, or masses Cardiac: RRR; no murmurs, rubs, or gallops,no edema  Respiratory:  clear to auscultation bilaterally, normal work of breathing GI: soft, nontender, nondistended, + BS MS: no deformity or atrophy Skin: warm and dry, no rash Neuro:  Strength and sensation are intact Psych: euthymic mood, full affect   EKG:  EKG is not ordered today.    Recent Labs: 01/06/2015: Hemoglobin 14.1; Platelets 153.0 01/09/2015: BUN 12; Creatinine, Ser 0.90; Potassium 4.2; Sodium 139    Lipid Panel    Component Value Date/Time   CHOL 149 06/09/2013 1129   TRIG 108.0 06/09/2013 1129   HDL 44.20 06/09/2013 1129   CHOLHDL 3 06/09/2013 1129   VLDL 21.6 06/09/2013 1129   LDLCALC 83 06/09/2013 1129      Wt Readings from Last 3 Encounters:  03/02/15 95.255 kg (210 lb)  02/15/15 91.173 kg (201 lb)  02/02/15 95.255 kg (210 lb)      Other studies Reviewed: Additional studies/ Walker that were reviewed today include: including data from a urology group in [place]. Review of the above Walker demonstrates: I reviewed the electrophysiology note from Dr. Graciela Walker.also reviewed urology notes.   ASSESSMENT AND PLAN:  1. Chronic systolic heart failure No evidence of volume overload  2. Atherosclerosis of native coronary artery of native heart without angina pectoris Widely patent LAD stent from cath in July.  3. Claudication nonlimiting  4. Dyslipidemia On therapy  5. Essential hypertension, benign Relatively low blood pressures on heart failure therapy  6. Tobacco abuse  continues to smoke. Encouraged to stop.  7. Kidney stone on right side Right kidney stone with impending stent and lithotripsy.    Current medicines are reviewed at length with the patient today.  The patient does not have concerns regarding medicines.  The following changes have been made:  I have  previously cleared him to have the upcoming urologic procedure. He is currently off Plavix and has had no complications. Once he is done with his urologic procedure, we will simply resume 81 mg aspirin per day. Later this fall he will follow-up with Dr. Graciela Walker for consideration of AICD.  If the sleep study reveals a positive diagnosis, he will obviously be started on treatment.  Labs/ tests ordered today include:  No orders of the defined types were placed in this encounter.     Disposition:   FU with HS in 4 months  Signed, [name] Noe, MD  03/02/2015 3:12 PM    [facility] [address] [place], [place], [place]  [address] Phone: [phone]; Fax: [phone]

## 2015-03-07 ENCOUNTER — Ambulatory Visit: Payer: PRIVATE HEALTH INSURANCE | Admitting: Anesthesiology

## 2015-03-07 ENCOUNTER — Ambulatory Visit
Admission: RE | Admit: 2015-03-07 | Discharge: 2015-03-07 | Disposition: A | Discharge status: Home / Self Care | Payer: PRIVATE HEALTH INSURANCE | Source: Ambulatory Visit | Attending: Urology | Admitting: Urology

## 2015-03-07 ENCOUNTER — Encounter: Payer: Self-pay | Admitting: *Deleted

## 2015-03-07 ENCOUNTER — Encounter
Admission: RE | Disposition: A | Discharge status: Home / Self Care | Payer: Self-pay | Source: Ambulatory Visit | Attending: Urology

## 2015-03-07 DIAGNOSIS — F17210 Nicotine dependence, cigarettes, uncomplicated: Secondary | ICD-10-CM | POA: Insufficient documentation

## 2015-03-07 DIAGNOSIS — I8390 Asymptomatic varicose veins of unspecified lower extremity: Secondary | ICD-10-CM | POA: Insufficient documentation

## 2015-03-07 DIAGNOSIS — I10 Essential (primary) hypertension: Secondary | ICD-10-CM | POA: Diagnosis not present

## 2015-03-07 DIAGNOSIS — I2510 Atherosclerotic heart disease of native coronary artery without angina pectoris: Secondary | ICD-10-CM | POA: Insufficient documentation

## 2015-03-07 DIAGNOSIS — Z7982 Long term (current) use of aspirin: Secondary | ICD-10-CM | POA: Diagnosis not present

## 2015-03-07 DIAGNOSIS — I509 Heart failure, unspecified: Secondary | ICD-10-CM | POA: Diagnosis not present

## 2015-03-07 DIAGNOSIS — I252 Old myocardial infarction: Secondary | ICD-10-CM | POA: Insufficient documentation

## 2015-03-07 DIAGNOSIS — G4730 Sleep apnea, unspecified: Secondary | ICD-10-CM | POA: Insufficient documentation

## 2015-03-07 DIAGNOSIS — N529 Male erectile dysfunction, unspecified: Secondary | ICD-10-CM | POA: Diagnosis not present

## 2015-03-07 DIAGNOSIS — N200 Calculus of kidney: Secondary | ICD-10-CM | POA: Diagnosis not present

## 2015-03-07 HISTORY — PX: CYSTOSCOPY WITH STENT PLACEMENT: SHX5790

## 2015-03-07 SURGERY — CYSTOSCOPY, WITH STENT INSERTION
Anesthesia: General | Laterality: Right | Wound class: Clean Contaminated

## 2015-03-07 MED ORDER — LEVOFLOXACIN IN D5W 500 MG/100ML IV SOLN: 500.0000 mg | Freq: Once | INTRAVENOUS | Status: DC

## 2015-03-07 MED ORDER — PROPOFOL 10 MG/ML IV BOLUS
INTRAVENOUS | Status: DC | PRN
  Administered 2015-03-07: 40 mg via INTRAVENOUS

## 2015-03-07 MED ORDER — FAMOTIDINE 20 MG PO TABS
ORAL_TABLET | ORAL | Status: AC
  Filled 2015-03-07: qty 1

## 2015-03-07 MED ORDER — LEVOFLOXACIN 500 MG PO TABS: 500.0000 mg | ORAL_TABLET | Freq: Every day | ORAL | Status: DC

## 2015-03-07 MED ORDER — EPHEDRINE SULFATE 50 MG/ML IJ SOLN
INTRAMUSCULAR | Status: DC | PRN
  Administered 2015-03-07 (×2): 5 mg via INTRAVENOUS

## 2015-03-07 MED ORDER — HYOSCYAMINE SULFATE 0.125 MG SL SUBL: 0.2500 mg | SUBLINGUAL_TABLET | SUBLINGUAL | Status: DC | PRN

## 2015-03-07 MED ORDER — ETOMIDATE 2 MG/ML IV SOLN
INTRAVENOUS | Status: DC | PRN
  Administered 2015-03-07: 10 mg via INTRAVENOUS

## 2015-03-07 MED ORDER — LIDOCAINE HCL 2 % EX GEL
CUTANEOUS | Status: DC | PRN
  Administered 2015-03-07: 1

## 2015-03-07 MED ORDER — ONDANSETRON HCL 4 MG/2ML IJ SOLN
INTRAMUSCULAR | Status: DC | PRN
  Administered 2015-03-07: 4 mg via INTRAVENOUS

## 2015-03-07 MED ORDER — PHENYLEPHRINE HCL 10 MG/ML IJ SOLN
INTRAMUSCULAR | Status: DC | PRN
  Administered 2015-03-07: 50 ug via INTRAVENOUS

## 2015-03-07 MED ORDER — BELLADONNA ALKALOIDS-OPIUM 16.2-60 MG RE SUPP
RECTAL | Status: AC
  Filled 2015-03-07: qty 1

## 2015-03-07 MED ORDER — OXYCODONE HCL 5 MG/5ML PO SOLN: 5.0000 mg | Freq: Once | ORAL | Status: DC | PRN

## 2015-03-07 MED ORDER — MIDAZOLAM HCL 5 MG/5ML IJ SOLN
INTRAMUSCULAR | Status: DC | PRN
  Administered 2015-03-07: 2 mg via INTRAVENOUS

## 2015-03-07 MED ORDER — FENTANYL CITRATE (PF) 100 MCG/2ML IJ SOLN
INTRAMUSCULAR | Status: DC | PRN
  Administered 2015-03-07 (×2): 25 ug via INTRAVENOUS
  Administered 2015-03-07: 50 ug via INTRAVENOUS

## 2015-03-07 MED ORDER — FENTANYL CITRATE (PF) 100 MCG/2ML IJ SOLN: 25.0000 ug | INTRAMUSCULAR | Status: DC | PRN

## 2015-03-07 MED ORDER — NUCYNTA 50 MG PO TABS: 50.0000 mg | ORAL_TABLET | Freq: Four times a day (QID) | ORAL | Status: DC | PRN

## 2015-03-07 MED ORDER — OXYCODONE HCL 5 MG PO TABS: 5.0000 mg | ORAL_TABLET | Freq: Once | ORAL | Status: DC | PRN

## 2015-03-07 MED ORDER — LIDOCAINE HCL 2 % EX GEL
CUTANEOUS | Status: AC
  Filled 2015-03-07: qty 10

## 2015-03-07 MED ORDER — LEVOFLOXACIN IN D5W 500 MG/100ML IV SOLN
INTRAVENOUS | Status: AC
  Administered 2015-03-07: 500 mg via INTRAVENOUS
  Filled 2015-03-07: qty 100

## 2015-03-07 MED ORDER — LACTATED RINGERS IV SOLN
INTRAVENOUS | Status: DC
  Administered 2015-03-07: 15:00:00 via INTRAVENOUS

## 2015-03-07 MED ORDER — LIDOCAINE HCL (CARDIAC) 20 MG/ML IV SOLN
INTRAVENOUS | Status: DC | PRN
  Administered 2015-03-07: 60 mg via INTRAVENOUS

## 2015-03-07 MED ORDER — BELLADONNA ALKALOIDS-OPIUM 16.2-60 MG RE SUPP
RECTAL | Status: DC | PRN
  Administered 2015-03-07: 1 via RECTAL

## 2015-03-07 MED ORDER — FAMOTIDINE 20 MG PO TABS
20.0000 mg | ORAL_TABLET | Freq: Once | ORAL | Status: AC
  Administered 2015-03-07: 20 mg via ORAL

## 2015-03-07 SURGICAL SUPPLY — 20 items
BAG DRAIN CYSTO-URO LG1000N (MISCELLANEOUS) ×2 IMPLANT
CONRAY 43 FOR UROLOGY 50M (MISCELLANEOUS) ×2 IMPLANT
GLOVE BIO SURGEON STRL SZ7 (GLOVE) ×4 IMPLANT
GLOVE BIO SURGEON STRL SZ7.5 (GLOVE) ×2 IMPLANT
GOWN STRL REUS W/ TWL LRG LVL3 (GOWN DISPOSABLE) ×1 IMPLANT
GOWN STRL REUS W/ TWL XL LVL3 (GOWN DISPOSABLE) ×1 IMPLANT
GOWN STRL REUS W/TWL LRG LVL3 (GOWN DISPOSABLE) ×1
GOWN STRL REUS W/TWL XL LVL3 (GOWN DISPOSABLE) ×1
GUIDEWIRE STR ZIPWIRE 035X150 (MISCELLANEOUS) ×2 IMPLANT
KIT RM TURNOVER CYSTO AR (KITS) ×2 IMPLANT
PACK CYSTO AR (MISCELLANEOUS) ×2 IMPLANT
PREP PVP WINGED SPONGE (MISCELLANEOUS) ×2 IMPLANT
SET CYSTO W/LG BORE CLAMP LF (SET/KITS/TRAYS/PACK) ×2 IMPLANT
SOL .9 NS 3000ML IRR  AL (IV SOLUTION) ×1
SOL .9 NS 3000ML IRR UROMATIC (IV SOLUTION) ×1 IMPLANT
SOL PREP PVP 2OZ (MISCELLANEOUS) ×2
SOLUTION PREP PVP 2OZ (MISCELLANEOUS) ×1 IMPLANT
STENT URET 6FRX24 CONTOUR (STENTS) ×2 IMPLANT
STENT URET 6FRX26 CONTOUR (STENTS) ×4 IMPLANT
WATER STERILE IRR 1000ML POUR (IV SOLUTION) ×2 IMPLANT

## 2015-03-07 NOTE — Op Note (Signed)
Preoperative diagnosis: Right nephrolithiasis Postoperative diagnosis: Same  Procedure: 1. Cystoscopy with right double pigtail ureteral stent placement                      2. Fluoroscopy   Surgeon: Suszanne Conners. Evelene Croon MD, FACS Anesthesia: Gen.  Indications:See the history and physical. After informed consent the above procedure(s) were requested     Technique and findings: After adequate general anesthesia had been obtained the patient was placed into dorsal lithotomy position and the perineum was prepped and draped in the usual fashion. Fluoroscopy confirmed the presence of a large right renal stone. The 21 French cystoscope was then coupled to the camera and visually advanced into the bladder. The bladder was then thoroughly inspected. Both ureteral orifices were identified and had clear reflux. No bladder mucosal lesions were identified. At this point a 0.035 guidewire was advanced up the right ureter under fluoroscopic guidance. The tip of the guidewire was curled in the renal pelvis. A 6 x 26 cm double-pigtail stent was advanced over the guidewire and positioned in the ureter. The suture was left attached to the distal end of the stent. The guidewire was then removed taking care to leave the stent in position. At this point the bladder was drained and the cystoscope was removed. 10 cc of viscous Xylocaine was instilled within the urethra. A B&O suppository was placed. Procedure was then terminated and the patient was transferred to the recovery room in stable condition.

## 2015-03-07 NOTE — Anesthesia Preprocedure Evaluation (Addendum)
Anesthesia Evaluation  Patient identified by MRN, date of birth, ID band Patient awake    Reviewed: Allergy & Precautions, H&P , NPO status , Patient's Chart, lab work & pertinent test results  History of Anesthesia Complications Negative for: history of anesthetic complications  Airway Mallampati: III  TM Distance: >3 FB Neck ROM: full    Dental  (+) Poor Dentition   Pulmonary sleep apnea , Current Smoker,  breath sounds clear to auscultation  Pulmonary exam normal       Cardiovascular Exercise Tolerance: Good hypertension, - angina+ CAD, + Past MI, + Cardiac Stents and +CHF Normal cardiovascular examRhythm:regular Rate:Normal     Neuro/Psych negative neurological ROS  negative psych ROS   GI/Hepatic negative GI ROS, Neg liver ROS,   Endo/Other  negative endocrine ROS  Renal/GU Renal diseasenegative Renal ROS  negative genitourinary   Musculoskeletal   Abdominal   Peds  Hematology negative hematology ROS (+)   Anesthesia Other Findings Past Medical History:   Varicose veins                                               No pertinent past medical history                            Hypertension                                                 Chronic kidney disease                                         Comment:stone   Sleep apnea                                                  Sleep apnea                                                    Comment:to have sleep study   Coronary artery disease                                      MI (myocardial infarction)                      06/19/2012     Comment:being considered for defibilator   Reproductive/Obstetrics negative OB ROS                            Anesthesia Physical Anesthesia Plan  ASA: IV  Anesthesia Plan: General LMA   Post-op Pain Management:    Induction:   Airway Management Planned:   Additional Equipment:    Intra-op Plan:   Post-operative  Plan:   Informed Consent: I have reviewed the patients History and Physical, chart, labs and discussed the procedure including the risks, benefits and alternatives for the proposed anesthesia with the patient or authorized representative who has indicated his/her understanding and acceptance.   Dental Advisory Given  Plan Discussed with: Anesthesiologist, CRNA and Surgeon  Anesthesia Plan Comments: (Patient informed that he is higher risk for medical compliacionts during this procedure due to his medical history.  Patient voiced understanding. )       Anesthesia Quick Evaluation

## 2015-03-07 NOTE — Anesthesia Procedure Notes (Signed)
Procedure Name: LMA Insertion Date/Time: 03/07/2015 3:10 PM Performed by: Lily Kocher Pre-anesthesia Checklist: Patient identified, Patient being monitored, Timeout performed, Emergency Drugs available and Suction available Patient Re-evaluated:Patient Re-evaluated prior to inductionOxygen Delivery Method: Circle system utilized Preoxygenation: Pre-oxygenation with 100% oxygen Intubation Type: IV induction Ventilation: Mask ventilation without difficulty LMA: LMA inserted LMA Size: 5.0 Tube type: Oral Number of attempts: 1 Placement Confirmation: positive ETCO2 and breath sounds checked- equal and bilateral Tube secured with: Tape Dental Injury: Teeth and Oropharynx as per pre-operative assessment

## 2015-03-07 NOTE — Transfer of Care (Signed)
Immediate Anesthesia Transfer of Care Note  Patient: [name] Walker  Procedure(s) Performed: Procedure(s): CYSTOSCOPY WITH STENT PLACEMENT (Right)  Patient Location: PACU  Anesthesia Type:General  Level of Consciousness: awake and patient cooperative  Airway & Oxygen Therapy: Patient Spontanous Breathing and Patient connected to face mask oxygen  Post-op Assessment: Report given to RN  Post vital signs: Reviewed and stable  Last Vitals:  Filed Vitals:   03/07/15 1550  BP: 106/73  Pulse: 59  Temp: 36.3 C  Resp: 13    Complications: No apparent anesthesia complications

## 2015-03-07 NOTE — Discharge Instructions (Addendum)
Ureteral Stent Implantation, Care After Refer to this sheet in the next few weeks. These instructions provide you with information on caring for yourself after your procedure. Your health care provider may also give you more specific instructions. Your treatment has been planned according to current medical practices, but problems sometimes occur. Call your health care provider if you have any problems or questions after your procedure. WHAT TO EXPECT AFTER THE PROCEDURE You should be back to normal activity within 48 hours after the procedure. Nausea and vomiting may occur and are commonly the result of anesthesia. It is common to experience sharp pain in the back or lower abdomen and penis with voiding. This is caused by movement of the ends of the stent with the act of urinating.It usually goes away within minutes after you have stopped urinating. HOME CARE INSTRUCTIONS Make sure to drink plenty of fluids. You may have small amounts of bleeding, causing your urine to be red. This is normal. Certain movements may trigger pain or a feeling that you need to urinate. You may be given medicines to prevent infection or bladder spasms. Be sure to take all medicines as directed. Only take over-the-counter or prescription medicines for pain, discomfort, or fever as directed by your health care provider. Do not take aspirin, as this can make bleeding worse. Your stent will be left in until the blockage is resolved. This may take 2 weeks or longer, depending on the reason for stent implantation. You may have an X-ray exam to make sure your ureter is open and that the stent has not moved out of position (migrated). The stent can be removed by your health care provider in the office. Medicines may be given for comfort while the stent is being removed. Be sure to keep all follow-up appointments so your health care provider can check that you are healing properly. SEEK MEDICAL CARE IF:  You experience increasing  pain.  Your pain medicine is not working. SEEK IMMEDIATE MEDICAL CARE IF:  Your urine is dark red or has blood clots.  You are leaking urine (incontinent).  You have a fever, chills, feeling sick to your stomach (nausea), or vomiting.  Your pain is not relieved by pain medicine.  The end of the stent comes out of the urethra.  You are unable to urinate. Document Released: 02/17/2013 Document Revised: 06/22/2013 Document Reviewed: 02/17/2013 [facility] Patient Information 2015 [place], [place]. This information is not intended to replace advice given to you by your health care provider. Make sure you discuss any questions you have with your health care provider. Ureteral Stent Implantation Ureteral stent implantation is the implantation of a soft plastic tube with multiple holes into the tube that drains urine from your kidney to your bladder (ureter). The stent helps drain your kidney when there is a blockage of the flow of urine in your ureter. The stent has a coil on each end to keep it from falling out. One end stays in the kidney. The other end stays in the bladder. It is most often taken out after any blockage has been removed or your ureter has healed. Short-term stents have a string attached to make removal quite easy. Removal of a short-term stent can be done in your health care provider's office or by you at home. Long-term stents need to be changed every few months. LET [facility] CARE PROVIDER KNOW ABOUT:  Any allergies you have.  All medicines you are taking, including vitamins, herbs, eye drops, creams, and over-the-counter  medicines.  Previous problems you or members of your family have had with the use of anesthetics.  Any blood disorders you have.  Previous surgeries you have had.  Medical conditions you have. RISKS AND COMPLICATIONS Generally, ureteral stent implantation is a safe procedure. However, as with any procedure, complications can occur. Possible  complications include:  Movement of the stent away from where it was originally placed (migration). This may affect the ability of the stent to properly drain your kidney. If migration of the stent occurs, the stent may need to be replaced or repositioned.  Perforation of the ureter.  Infection. BEFORE THE PROCEDURE  You may be asked to wash your genital area with sterile soap the morning of your procedure.  You may be given an oral antibiotic which you should take with a sip of water as prescribed by your health care provider.  You may be asked to not eat or drink for 8 hours before the surgery. PROCEDURE  First you will be given an anesthetic so you do not feel pain during the procedure.  Your health care provider will insert a special lighted instrument called a cystoscope into your bladder. This allows your health care provider to see the opening to your ureter.  A thin wire is carefully threaded into your bladder and up the ureter. The stent is inserted over the wire and the wire is then removed.  Your bladder will be emptied of urine. AFTER THE PROCEDURE You will be taken to a recovery room until it is okay for you to go home. Document Released: 06/14/2000 Document Revised: 06/22/2013 Document Reviewed: 11/24/2012 [facility] Patient Information 2015 [place], [place]. This information is not intended to replace advice given to you by your health care provider. Make sure you discuss any questions you have with your health care provider.     AMBULATORY SURGERY  DISCHARGE INSTRUCTIONS   1) The drugs that you were given will stay in your system until tomorrow so for the next 24 hours you should not:  A) Drive an automobile B) Make any legal decisions C) Drink any alcoholic beverage   2) You may resume regular meals tomorrow.  Today it is better to start with liquids and gradually work up to solid foods.  You may eat anything you prefer, but it is better to start with  liquids, then soup and crackers, and gradually work up to solid foods.   3) Please notify your doctor immediately if you have any unusual bleeding, trouble breathing, redness and pain at the surgery site, drainage, fever, or pain not relieved by medication. 4)   5) Your post-operative visit with Dr.                                     is: Date:                        Time:    Please call to schedule your post-operative visit.  6) Additional Instructions: 7)

## 2015-03-07 NOTE — H&P (Signed)
Date of Initial H&P: 02/16/15  History reviewed, patient examined, no change in status, stable for surgery.

## 2015-03-08 ENCOUNTER — Encounter: Payer: Self-pay | Admitting: Urology

## 2015-03-09 ENCOUNTER — Encounter
Admission: RE | Disposition: A | Discharge status: Home / Self Care | Payer: Self-pay | Source: Ambulatory Visit | Attending: Urology

## 2015-03-09 ENCOUNTER — Encounter: Payer: Self-pay | Admitting: *Deleted

## 2015-03-09 ENCOUNTER — Ambulatory Visit
Admission: RE | Admit: 2015-03-09 | Discharge: 2015-03-09 | Disposition: A | Discharge status: Home / Self Care | Payer: PRIVATE HEALTH INSURANCE | Source: Ambulatory Visit | Attending: Urology | Admitting: Urology

## 2015-03-09 DIAGNOSIS — Z7982 Long term (current) use of aspirin: Secondary | ICD-10-CM | POA: Diagnosis not present

## 2015-03-09 DIAGNOSIS — N200 Calculus of kidney: Secondary | ICD-10-CM | POA: Diagnosis present

## 2015-03-09 DIAGNOSIS — I252 Old myocardial infarction: Secondary | ICD-10-CM | POA: Diagnosis not present

## 2015-03-09 DIAGNOSIS — F17200 Nicotine dependence, unspecified, uncomplicated: Secondary | ICD-10-CM | POA: Insufficient documentation

## 2015-03-09 DIAGNOSIS — Z79899 Other long term (current) drug therapy: Secondary | ICD-10-CM | POA: Diagnosis not present

## 2015-03-09 DIAGNOSIS — Z955 Presence of coronary angioplasty implant and graft: Secondary | ICD-10-CM | POA: Diagnosis not present

## 2015-03-09 HISTORY — PX: EXTRACORPOREAL SHOCK WAVE LITHOTRIPSY: SHX1557

## 2015-03-09 SURGERY — LITHOTRIPSY, ESWL
Anesthesia: Moderate Sedation | Laterality: Right

## 2015-03-09 MED ORDER — LEVOFLOXACIN 500 MG PO TABS
500.0000 mg | ORAL_TABLET | Freq: Once | ORAL | Status: AC
  Administered 2015-03-09: 500 mg via ORAL

## 2015-03-09 MED ORDER — DOCUSATE SODIUM 100 MG PO CAPS: 200.0000 mg | ORAL_CAPSULE | Freq: Two times a day (BID) | ORAL | Status: DC

## 2015-03-09 MED ORDER — FUROSEMIDE 10 MG/ML IJ SOLN
10.0000 mg | Freq: Once | INTRAMUSCULAR | Status: DC
Start: 2015-03-09 — End: 2015-03-09

## 2015-03-09 MED ORDER — FUROSEMIDE 10 MG/ML IJ SOLN
10.0000 mg | Freq: Once | INTRAMUSCULAR | Status: AC
  Administered 2015-03-09: 10 mg via INTRAVENOUS

## 2015-03-09 MED ORDER — NUCYNTA 50 MG PO TABS: 50.0000 mg | ORAL_TABLET | Freq: Four times a day (QID) | ORAL | Status: DC | PRN

## 2015-03-09 MED ORDER — MORPHINE SULFATE (PF) 10 MG/ML IV SOLN
INTRAVENOUS | Status: AC
  Filled 2015-03-09: qty 1

## 2015-03-09 MED ORDER — OXYCODONE-ACETAMINOPHEN 10-325 MG PO TABS: 1.0000 | ORAL_TABLET | ORAL | Status: DC | PRN

## 2015-03-09 MED ORDER — LEVOFLOXACIN 500 MG PO TABS: 500.0000 mg | ORAL_TABLET | Freq: Every day | ORAL | Status: DC

## 2015-03-09 MED ORDER — PROMETHAZINE HCL 25 MG/ML IJ SOLN
INTRAMUSCULAR | Status: AC
  Filled 2015-03-09: qty 1

## 2015-03-09 MED ORDER — PROMETHAZINE HCL 25 MG RE SUPP: 25.0000 mg | Freq: Four times a day (QID) | RECTAL | Status: DC | PRN

## 2015-03-09 MED ORDER — PROMETHAZINE HCL 25 MG/ML IJ SOLN
25.0000 mg | Freq: Once | INTRAMUSCULAR | Status: AC
  Administered 2015-03-09: 25 mg via INTRAMUSCULAR

## 2015-03-09 MED ORDER — LEVOFLOXACIN 500 MG PO TABS
ORAL_TABLET | ORAL | Status: AC
  Filled 2015-03-09: qty 1

## 2015-03-09 MED ORDER — MIDAZOLAM HCL 2 MG/2ML IJ SOLN
INTRAMUSCULAR | Status: AC
  Filled 2015-03-09: qty 2

## 2015-03-09 MED ORDER — DIPHENHYDRAMINE HCL 25 MG PO CAPS
ORAL_CAPSULE | ORAL | Status: AC
  Filled 2015-03-09: qty 1

## 2015-03-09 MED ORDER — DEXTROSE-NACL 5-0.45 % IV SOLN
INTRAVENOUS | Status: DC
  Administered 2015-03-09: 08:00:00 via INTRAVENOUS

## 2015-03-09 MED ORDER — ONDANSETRON 8 MG PO TBDP: 8.0000 mg | ORAL_TABLET | Freq: Four times a day (QID) | ORAL | Status: DC | PRN

## 2015-03-09 MED ORDER — MIDAZOLAM HCL 2 MG/2ML IJ SOLN
1.0000 mg | Freq: Once | INTRAMUSCULAR | Status: AC
  Administered 2015-03-09: 1 mg via INTRAMUSCULAR

## 2015-03-09 MED ORDER — MORPHINE SULFATE (PF) 10 MG/ML IV SOLN
10.0000 mg | Freq: Once | INTRAVENOUS | Status: AC
  Administered 2015-03-09: 10 mg via INTRAMUSCULAR

## 2015-03-09 MED ORDER — FUROSEMIDE 10 MG/ML IJ SOLN
INTRAMUSCULAR | Status: AC
  Administered 2015-03-09: 10 mg via INTRAVENOUS
  Filled 2015-03-09: qty 2

## 2015-03-09 MED ORDER — DIPHENHYDRAMINE HCL 25 MG PO CAPS
25.0000 mg | ORAL_CAPSULE | ORAL | Status: AC
  Administered 2015-03-09: 25 mg via ORAL

## 2015-03-09 NOTE — Discharge Instructions (Addendum)
Dietary Guidelines to Help Prevent Kidney Stones °Your risk of kidney stones can be decreased by adjusting the foods you eat. The most important thing you can do is drink enough fluid. You should drink enough fluid to keep your urine clear or pale yellow. The following guidelines provide specific information for the type of kidney stone you have had. °GUIDELINES ACCORDING TO TYPE OF KIDNEY STONE °Calcium Oxalate Kidney Stones °· Reduce the amount of salt you eat. Foods that have a lot of salt cause your body to release excess calcium into your urine. The excess calcium can combine with a substance called oxalate to form kidney stones. °· Reduce the amount of animal protein you eat if the amount you eat is excessive. Animal protein causes your body to release excess calcium into your urine. Ask your dietitian how much protein from animal sources you should be eating. °· Avoid foods that are high in oxalates. If you take vitamins, they should have less than 500 mg of vitamin C. Your body turns vitamin C into oxalates. You do not need to avoid fruits and vegetables high in vitamin C. °Calcium Phosphate Kidney Stones °· Reduce the amount of salt you eat to help prevent the release of excess calcium into your urine. °· Reduce the amount of animal protein you eat if the amount you eat is excessive. Animal protein causes your body to release excess calcium into your urine. Ask your dietitian how much protein from animal sources you should be eating. °· Get enough calcium from food or take a calcium supplement (ask your dietitian for recommendations). Food sources of calcium that do not increase your risk of kidney stones include: °· Broccoli. °· Dairy products, such as cheese and yogurt. °· Pudding. °Uric Acid Kidney Stones °· Do not have more than 6 oz of animal protein per day. °FOOD SOURCES °Animal Protein Sources °· Meat (all types). °· Poultry. °· Eggs. °· Fish, seafood. °Foods High in Salt °· Salt seasonings. °· Soy  sauce. °· Teriyaki sauce. °· Cured and processed meats. °· Salted crackers and snack foods. °· Fast food. °· Canned soups and most canned foods. °Foods High in Oxalates °· Grains: °· Amaranth. °· Barley. °· Grits. °· Wheat germ. °· Bran. °· Buckwheat flour. °· All bran cereals. °· Pretzels. °· Whole wheat bread. °· Vegetables: °· Beans (wax). °· Beets and beet greens. °· Collard greens. °· Eggplant. °· Escarole. °· Leeks. °· Okra. °· Parsley. °· Rutabagas. °· Spinach. °· Swiss chard. °· Tomato paste. °· Fried potatoes. °· Sweet potatoes. °· Fruits: °· Red currants. °· Figs. °· Kiwi. °· Rhubarb. °· Meat and Other Protein Sources: °· Beans (dried). °· Soy burgers and other soybean products. °· Miso. °· Nuts (peanuts, almonds, pecans, cashews, hazelnuts). °· Nut butters. °· Sesame seeds and tahini (paste made of sesame seeds). °· Poppy seeds. °· Beverages: °· Chocolate drink mixes. °· Soy milk. °· Instant iced tea. °· Juices made from high-oxalate fruits or vegetables. °· Other: °· Carob. °· Chocolate. °· Fruitcake. °· Marmalades. °Document Released: 10/12/2010 Document Revised: 06/22/2013 Document Reviewed: 05/14/2013 °ExitCare® Patient Information ©2015 [employer]. This information is not intended to replace advice given to you by your health care provider. Make sure you discuss any questions you have with your health care provider. ° °Kidney Stones °Kidney stones (urolithiasis) are deposits that form inside your kidneys. The intense pain is caused by the stone moving through the urinary tract. When the stone moves, the ureter goes into spasm around the   10/12/2010 Document Revised: 06/22/2013 Document Reviewed: 05/14/2013  ExitCare Patient Information 2015 [employer]. This information is not intended to replace advice given to you by your health care provider. Make sure you discuss any questions you have with your health care provider.  Kidney Stones  Kidney stones (urolithiasis) are deposits that form inside your kidneys. The intense pain is caused by the stone moving through the urinary tract. When the stone moves, the ureter goes into spasm around the stone. The stone is usually passed in the urine.   CAUSES    A disorder that makes certain neck glands produce too much parathyroid hormone (primary hyperparathyroidism).   A buildup of uric acid crystals, similar to gout in your joints.   Narrowing (stricture) of the ureter.   A kidney obstruction present at birth (congenital obstruction).   Previous surgery on  the kidney or ureters.   Numerous kidney infections.  SYMPTOMS    Feeling sick to your stomach (nauseous).   Throwing up (vomiting).   Blood in the urine (hematuria).   Pain that usually spreads (radiates) to the groin.   Frequency or urgency of urination.  DIAGNOSIS    Taking a history and physical exam.   Blood or urine tests.   CT scan.   Occasionally, an examination of the inside of the urinary bladder (cystoscopy) is performed.  TREATMENT    Observation.   Increasing your fluid intake.   Extracorporeal shock wave lithotripsy--This is a noninvasive procedure that uses shock waves to break up kidney stones.   Surgery may be needed if you have severe pain or persistent obstruction. There are various surgical procedures. Most of the procedures are performed with the use of small instruments. Only small incisions are needed to accommodate these instruments, so recovery time is minimized.  The size, location, and chemical composition are all important variables that will determine the proper choice of action for you. Talk to your health care provider to better understand your situation so that you will minimize the risk of injury to yourself and your kidney.   HOME CARE INSTRUCTIONS    Drink enough water and fluids to keep your urine clear or pale yellow. This will help you to pass the stone or stone fragments.   Strain all urine through the provided strainer. Keep all particulate matter and stones for your health care provider to see. The stone causing the pain may be as small as a grain of salt. It is very important to use the strainer each and every time you pass your urine. The collection of your stone will allow your health care provider to analyze it and verify that a stone has actually passed. The stone analysis will often identify what you can do to reduce the incidence of recurrences.   Only take over-the-counter or prescription medicines for pain, discomfort, or fever as directed by your  health care provider.   Make a follow-up appointment with your health care provider as directed.   Get follow-up X-rays if required. The absence of pain does not always mean that the stone has passed. It may have only stopped moving. If the urine remains completely obstructed, it can cause loss of kidney function or even complete destruction of the kidney. It is your responsibility to make sure X-rays and follow-ups are completed. Ultrasounds of the kidney can show blockages and the status of the kidney. Ultrasounds are not associated with any radiation and can be performed easily in a matter of minutes.  SEEK   Released: 06/17/2005 Document Revised: 02/17/2013 Document Reviewed: 11/18/2012 [facility] Patient Information 2015 [place], [place]. This information is not intended to replace advice given to you by your health care provider. Make sure you discuss any questions you have with your health care provider.  Lithotripsy for Kidney Stones Lithotripsy is a treatment that can sometimes help eliminate kidney stones and pain that they cause. A form of lithotripsy, also known as extracorporeal shock wave lithotripsy, is a nonsurgical procedure that helps your body rid itself of the kidney stone when it is too big to pass on its own. Extracorporeal shock wave lithotripsy is a method of crushing a kidney stone  with shock waves. These shock waves pass through your body and are focused on your stone. They cause the kidney stones to crumble while still in the urinary tract. It is then easier for the smaller pieces of stone to pass in the urine. Lithotripsy usually takes about an hour. It is done in a hospital, a lithotripsy center, or a mobile unit. It usually does not require an overnight stay. Your health care provider will instruct you on preparation for the procedure. Your health care provider will tell you what to expect afterward. LET [facility] CARE PROVIDER KNOW ABOUT:  Any allergies you have.  All medicines you are taking, including vitamins, herbs, eye drops, creams, and over-the-counter medicines.  Previous problems you or members of your family have had with the use of anesthetics.  Any blood disorders you have.  Previous surgeries you have had.  Medical conditions you have. RISKS AND COMPLICATIONS Generally, lithotripsy for kidney stones is a safe procedure. However, as with any procedure, complications can occur. Possible complications include:  Infection.  Bleeding of the kidney.  Bruising of the kidney or skin.  Obstruction of the ureter.  Failure of the stone to fragment. BEFORE THE PROCEDURE  Do not eat or drink for 6-8 hours prior to the procedure. You may, however, take the medications with a sip of water that your physician instructs you to take  Do not take aspirin or aspirin-containing products for 7 days prior to your procedure  Do not take nonsteroidal anti-inflammatory products for 7 days prior to your procedure PROCEDURE A stent (flexible tube with holes) may be placed in your ureter. The ureter is the tube that transports the urine from the kidneys to the bladder. Your health care provider may place a stent before the procedure. This will help keep urine flowing from the kidney if the fragments of the stone block the ureter. You may have an IV tube placed in one  of your veins to give you fluids and medicines. These medicines may help you relax or make you sleep. During the procedure, you will lie comfortably on a fluid-filled cushion or in a warm-water bath. After an X-ray or ultrasound exam to locate your stone, shock waves are aimed at the stone. If you are awake, you may feel a tapping sensation as the shock waves pass through your body. If large stone particles remain after treatment, a second procedure may be necessary at a later date. For comfort during the test:  Relax as much as possible.  Try to remain still as much as possible.  Try to follow instructions to speed up the test.  Let your health care provider know if you are uncomfortable, anxious, or in pain. AFTER THE PROCEDURE  After surgery, you will be taken to the recovery area. A nurse will watch and check your progress. Once  you're awake, stable, and taking fluids well, you will be allowed to go home as long as there are no problems. You will also be allowed to pass your urine before discharge.You may be given antibiotics to help prevent infection. You may also be prescribed pain medicine if needed. In a week or two, your health care provider may remove your stent, if you have one. You may first have an X-ray exam to check on how successful the fragmentation of your stone has been and how much of the stone has passed. Your health care provider will check to see whether or not stone particles remain. SEEK IMMEDIATE MEDICAL CARE IF:  You develop a fever or shaking chills.  Your pain is not relieved by medicine.  You feel sick to your stomach (nauseated) and you vomit.  You develop heavy bleeding.  You have difficulty urinating.  You start to pass your stent from your penis. Document Released: 06/14/2000 Document Revised: 04/07/2013 Document Reviewed: 12/31/2012 [facility] Patient Information 2015 [place], [place]. This information is not intended to replace advice given to you by your  health care provider. Make sure you discuss any questions you have with your health care provider. AMBULATORY SURGERY  DISCHARGE INSTRUCTIONS   1) The drugs that you were given will stay in your system until tomorrow so for the next 24 hours you should not:  A) Drive an automobile B) Make any legal decisions C) Drink any alcoholic beverage   2) You may resume regular meals tomorrow.  Today it is better to start with liquids and gradually work up to solid foods.  You may eat anything you prefer, but it is better to start with liquids, then soup and crackers, and gradually work up to solid foods.   3) Please notify your doctor immediately if you have any unusual bleeding, trouble breathing, redness and pain at the surgery site, drainage, fever, or pain not relieved by medication.    4) Additional Instructions:  Follow the DC instructions for ESL patients as reviewed in office and Dr. Amada Jupiter attached instructions  Please contact your physician with any problems or Same Day Surgery at [phone], Monday through Friday 6 am to 4 pm, or [place] at [facility] number at [phone].

## 2015-03-09 NOTE — OR Nursing (Signed)
Redness on right flank 

## 2015-03-10 ENCOUNTER — Encounter: Payer: Self-pay | Admitting: Urology

## 2015-03-10 NOTE — Anesthesia Postprocedure Evaluation (Signed)
  Anesthesia Post-op Note  Patient: [name] Walker  Procedure(s) Performed: Procedure(s): CYSTOSCOPY WITH STENT PLACEMENT (Right)  Anesthesia type:General LMA  Patient location: PACU  Post pain: Pain level controlled  Post assessment: Post-op Vital signs reviewed, Patient's Cardiovascular Status Stable, Respiratory Function Stable, Patent Airway and No signs of Nausea or vomiting  Post vital signs: Reviewed and stable  Last Vitals:  Filed Vitals:   03/07/15 1712  BP: 137/82  Pulse: 58  Temp:   Resp: 16    Level of consciousness: awake, alert  and patient cooperative  Complications: No apparent anesthesia complications

## 2015-03-11 ENCOUNTER — Inpatient Hospital Stay (HOSPITAL_COMMUNITY)
Admission: EM | Admit: 2015-03-11 | Discharge: 2015-03-14 | DRG: 281 | Disposition: A | Discharge status: Home / Self Care | Payer: PRIVATE HEALTH INSURANCE | Attending: Cardiology | Admitting: Cardiology

## 2015-03-11 ENCOUNTER — Emergency Department (HOSPITAL_COMMUNITY): Payer: PRIVATE HEALTH INSURANCE

## 2015-03-11 ENCOUNTER — Encounter (HOSPITAL_COMMUNITY): Payer: Self-pay | Admitting: Emergency Medicine

## 2015-03-11 DIAGNOSIS — R0789 Other chest pain: Secondary | ICD-10-CM

## 2015-03-11 DIAGNOSIS — E785 Hyperlipidemia, unspecified: Secondary | ICD-10-CM | POA: Diagnosis present

## 2015-03-11 DIAGNOSIS — Z7902 Long term (current) use of antithrombotics/antiplatelets: Secondary | ICD-10-CM

## 2015-03-11 DIAGNOSIS — R7989 Other specified abnormal findings of blood chemistry: Secondary | ICD-10-CM | POA: Diagnosis not present

## 2015-03-11 DIAGNOSIS — N200 Calculus of kidney: Secondary | ICD-10-CM

## 2015-03-11 DIAGNOSIS — I10 Essential (primary) hypertension: Secondary | ICD-10-CM | POA: Diagnosis present

## 2015-03-11 DIAGNOSIS — I5022 Chronic systolic (congestive) heart failure: Secondary | ICD-10-CM | POA: Diagnosis present

## 2015-03-11 DIAGNOSIS — I255 Ischemic cardiomyopathy: Secondary | ICD-10-CM | POA: Diagnosis not present

## 2015-03-11 DIAGNOSIS — Z79899 Other long term (current) drug therapy: Secondary | ICD-10-CM

## 2015-03-11 DIAGNOSIS — Z955 Presence of coronary angioplasty implant and graft: Secondary | ICD-10-CM

## 2015-03-11 DIAGNOSIS — G4733 Obstructive sleep apnea (adult) (pediatric): Secondary | ICD-10-CM | POA: Diagnosis present

## 2015-03-11 DIAGNOSIS — R079 Chest pain, unspecified: Secondary | ICD-10-CM | POA: Diagnosis present

## 2015-03-11 DIAGNOSIS — I214 Non-ST elevation (NSTEMI) myocardial infarction: Principal | ICD-10-CM | POA: Diagnosis present

## 2015-03-11 DIAGNOSIS — F17210 Nicotine dependence, cigarettes, uncomplicated: Secondary | ICD-10-CM | POA: Diagnosis present

## 2015-03-11 DIAGNOSIS — I2510 Atherosclerotic heart disease of native coronary artery without angina pectoris: Secondary | ICD-10-CM | POA: Diagnosis not present

## 2015-03-11 DIAGNOSIS — I252 Old myocardial infarction: Secondary | ICD-10-CM

## 2015-03-11 HISTORY — DX: Non-ST elevation (NSTEMI) myocardial infarction: I21.4

## 2015-03-11 HISTORY — DX: Ischemic cardiomyopathy: I25.5

## 2015-03-11 LAB — HEPARIN LEVEL (UNFRACTIONATED): Heparin Unfractionated: 0.14 IU/mL — ABNORMAL LOW (ref 0.30–0.70)

## 2015-03-11 LAB — CBC
HCT: 42.2 % (ref 39.0–52.0)
Hemoglobin: 14.4 g/dL (ref 13.0–17.0)
MCH: 32.5 pg (ref 26.0–34.0)
MCHC: 34.1 g/dL (ref 30.0–36.0)
MCV: 95.3 fL (ref 78.0–100.0)
Platelets: 154 10*3/uL (ref 150–400)
RBC: 4.43 MIL/uL (ref 4.22–5.81)
RDW: 13.1 % (ref 11.5–15.5)
WBC: 10.8 10*3/uL — ABNORMAL HIGH (ref 4.0–10.5)

## 2015-03-11 LAB — BASIC METABOLIC PANEL
Anion gap: 8 (ref 5–15)
BUN: 14 mg/dL (ref 6–20)
CO2: 25 mmol/L (ref 22–32)
Calcium: 9.1 mg/dL (ref 8.9–10.3)
Chloride: 105 mmol/L (ref 101–111)
Creatinine, Ser: 0.94 mg/dL (ref 0.61–1.24)
GFR calc Af Amer: >60 mL/min (ref >60)
GFR calc non Af Amer: >60 mL/min (ref >60)
Glucose, Bld: 129 mg/dL — ABNORMAL HIGH (ref 65–99)
Potassium: 4.2 mmol/L (ref 3.5–5.1)
Sodium: 138 mmol/L (ref 135–145)

## 2015-03-11 LAB — TROPONIN I
Troponin I: 4.57 ng/mL (ref <0.031)
Troponin I: 12.74 ng/mL (ref <0.031)
Troponin I: 7.94 ng/mL (ref <0.031)

## 2015-03-11 LAB — I-STAT TROPONIN, ED: Troponin i, poc: 0.3 ng/mL (ref 0.00–0.08)

## 2015-03-11 MED ORDER — ONDANSETRON HCL 4 MG/2ML IJ SOLN: 4.0000 mg | Freq: Four times a day (QID) | INTRAMUSCULAR | Status: DC | PRN

## 2015-03-11 MED ORDER — ASPIRIN 81 MG PO CHEW: 324.0000 mg | CHEWABLE_TABLET | ORAL | Status: AC

## 2015-03-11 MED ORDER — ASPIRIN 81 MG PO CHEW
324.0000 mg | CHEWABLE_TABLET | Freq: Once | ORAL | Status: AC
  Administered 2015-03-11: 324 mg via ORAL
  Filled 2015-03-11: qty 4

## 2015-03-11 MED ORDER — MORPHINE SULFATE (PF) 4 MG/ML IV SOLN
4.0000 mg | Freq: Once | INTRAVENOUS | Status: AC
  Administered 2015-03-11: 4 mg via INTRAVENOUS
  Filled 2015-03-11: qty 1

## 2015-03-11 MED ORDER — TAMSULOSIN HCL 0.4 MG PO CAPS
0.4000 mg | ORAL_CAPSULE | Freq: Every day | ORAL | Status: DC
Start: 2015-03-11 — End: 2015-03-14
  Administered 2015-03-11 – 2015-03-13 (×3): 0.4 mg via ORAL
  Filled 2015-03-11 (×5): qty 1

## 2015-03-11 MED ORDER — HEPARIN BOLUS VIA INFUSION
3000.0000 [IU] | Freq: Once | INTRAVENOUS | Status: AC
  Administered 2015-03-11: 3000 [IU] via INTRAVENOUS
  Filled 2015-03-11: qty 3000

## 2015-03-11 MED ORDER — SODIUM CHLORIDE 0.9 % IV SOLN
INTRAVENOUS | Status: DC
  Administered 2015-03-11 – 2015-03-12 (×3): via INTRAVENOUS

## 2015-03-11 MED ORDER — CARVEDILOL 12.5 MG PO TABS
12.5000 mg | ORAL_TABLET | Freq: Two times a day (BID) | ORAL | Status: DC
  Administered 2015-03-11 – 2015-03-13 (×5): 12.5 mg via ORAL
  Filled 2015-03-11 (×10): qty 1

## 2015-03-11 MED ORDER — SODIUM CHLORIDE 0.9 % IV BOLUS (SEPSIS)
1000.0000 mL | Freq: Once | INTRAVENOUS | Status: AC
  Administered 2015-03-11: 1000 mL via INTRAVENOUS

## 2015-03-11 MED ORDER — OXYCODONE-ACETAMINOPHEN 5-325 MG PO TABS: 1.0000 | ORAL_TABLET | ORAL | Status: DC | PRN

## 2015-03-11 MED ORDER — IRBESARTAN 75 MG PO TABS
75.0000 mg | ORAL_TABLET | Freq: Every day | ORAL | Status: DC
  Administered 2015-03-11 – 2015-03-14 (×4): 75 mg via ORAL
  Filled 2015-03-11 (×4): qty 1

## 2015-03-11 MED ORDER — ATORVASTATIN CALCIUM 20 MG PO TABS
20.0000 mg | ORAL_TABLET | Freq: Every day | ORAL | Status: DC
  Administered 2015-03-11: 20 mg via ORAL
  Filled 2015-03-11 (×2): qty 1

## 2015-03-11 MED ORDER — NITROGLYCERIN 0.4 MG SL SUBL: 0.4000 mg | SUBLINGUAL_TABLET | SUBLINGUAL | Status: DC | PRN

## 2015-03-11 MED ORDER — ONDANSETRON HCL 4 MG/2ML IJ SOLN
4.0000 mg | Freq: Once | INTRAMUSCULAR | Status: AC
  Administered 2015-03-11: 4 mg via INTRAVENOUS
  Filled 2015-03-11: qty 2

## 2015-03-11 MED ORDER — ACETAMINOPHEN 325 MG PO TABS
650.0000 mg | ORAL_TABLET | ORAL | Status: DC | PRN
  Filled 2015-03-11: qty 2

## 2015-03-11 MED ORDER — DOCUSATE SODIUM 100 MG PO CAPS
100.0000 mg | ORAL_CAPSULE | Freq: Two times a day (BID) | ORAL | Status: DC
  Administered 2015-03-11 – 2015-03-14 (×3): 100 mg via ORAL
  Filled 2015-03-11 (×9): qty 1

## 2015-03-11 MED ORDER — HEPARIN (PORCINE) IN NACL 100-0.45 UNIT/ML-% IJ SOLN
1500.0000 [IU]/h | INTRAMUSCULAR | Status: DC
  Administered 2015-03-11: 1200 [IU]/h via INTRAVENOUS
  Administered 2015-03-12: 1500 [IU]/h via INTRAVENOUS
  Filled 2015-03-11 (×3): qty 250

## 2015-03-11 MED ORDER — ASPIRIN EC 81 MG PO TBEC
81.0000 mg | DELAYED_RELEASE_TABLET | Freq: Every day | ORAL | Status: DC
  Administered 2015-03-12 – 2015-03-14 (×3): 81 mg via ORAL
  Filled 2015-03-11 (×5): qty 1

## 2015-03-11 MED ORDER — HEPARIN BOLUS VIA INFUSION
4000.0000 [IU] | Freq: Once | INTRAVENOUS | Status: AC
  Administered 2015-03-11: 4000 [IU] via INTRAVENOUS
  Filled 2015-03-11: qty 4000

## 2015-03-11 MED ORDER — ASPIRIN 300 MG RE SUPP: 300.0000 mg | RECTAL | Status: AC

## 2015-03-11 NOTE — H&P (Addendum)
Admit date: 03/11/2015 Primary Physician  Barbette Reichmann, MD Primary Cardiologist  Dr. Katrinka Blazing  CC: Chest pain   HPI: 59 year old male with ischemic cardiomyopathy, chronic systolic heart failure with ejection fraction less than 35%, NYHA class II, essential hypertension, tobacco use, sleep apnea, old anterior myocardial infarction with recent consideration of ICD therapy who presented to the emergency department with chest discomfort, left-sided, nausea, left arm discomfort as well. Currently pain-free. Duration may have been a few minutes he states. He did not have any unusual foods. His mother asked him to call EMS.  He just had surgical procedure, nephrostomy tubes placed by urology on 03/07/15.  LAD stent was widely patent from catheterization in July 2016. His blood pressures have been fairly low on heart failure therapy. 90 systolic.  He has been off of Plavix prior to urologic procedure. He was to resume aspirin 81 mg a day.    PMH:   Past Medical History  Diagnosis Date  . Varicose veins   . No pertinent past medical history   . Hypertension   . Chronic kidney disease     stone  . Sleep apnea   . Sleep apnea     to have sleep study  . Coronary artery disease   . MI (myocardial infarction) 06/19/2012    being considered for defibilator    PSH:   Past Surgical History  Procedure Laterality Date  . Coronary stent placement    . Left heart catheterization with coronary angiogram Right 06/19/2012    Procedure: LEFT HEART CATHETERIZATION WITH CORONARY ANGIOGRAM;  Surgeon: Lesleigh Noe, MD;  Location: [facility] CATH LAB;  Service: Cardiovascular;  Laterality: Right;  . Percutaneous coronary stent intervention (pci-s) Right 06/19/2012    Procedure: PERCUTANEOUS CORONARY STENT INTERVENTION (PCI-S);  Surgeon: Lesleigh Noe, MD;  Location: [facility] CATH LAB;  Service: Cardiovascular;  Laterality: Right;  . Cardiac catheterization N/A 01/09/2015    Procedure: Left Heart Cath and  Coronary Angiography;  Surgeon: Lyn Records, MD;  Location: [facility] INVASIVE CV LAB;  Service: Cardiovascular;  Laterality: N/A;  . Cystoscopy with stent placement Right 03/07/2015    Procedure: CYSTOSCOPY WITH STENT PLACEMENT;  Surgeon: Orson Ape, MD;  Location: [facility] ORS;  Service: Urology;  Laterality: Right;  . Extracorporeal shock wave lithotripsy Right 03/09/2015    Procedure: EXTRACORPOREAL SHOCK WAVE LITHOTRIPSY (ESWL);  Surgeon: Orson Ape, MD;  Location: [facility] ORS;  Service: Urology;  Laterality: Right;   Allergies:  Nucynta Prior to Admit Meds:   Prior to Admission medications   Medication Sig Start Date End Date Taking? Authorizing Provider  albuterol (PROVENTIL HFA;VENTOLIN HFA) 108 (90 BASE) MCG/ACT inhaler Inhale 2 puffs into the lungs every 6 (six) hours as needed for wheezing or shortness of breath (chest tightness). ProAir   Yes Historical Provider, MD  atorvastatin (LIPITOR) 20 MG tablet Take 20 mg by mouth daily at 6 PM.    Yes Historical Provider, MD  carvedilol (COREG) 12.5 MG tablet Take 12.5 mg by mouth 2 (two) times daily with a meal.   Yes Historical Provider, MD  clopidogrel (PLAVIX) 75 MG tablet Take 75 mg by mouth daily. 03/03/15  Yes Historical Provider, MD  hyoscyamine (LEVSIN/SL) 0.125 MG SL tablet Place 2 tablets (0.25 mg total) under the tongue every 4 (four) hours as needed (bladder spasms). 1-2 TABS Patient taking differently: Place 0.125 mg under the tongue every 4 (four) hours as needed (bladder spasms).  03/07/15  Yes Orson Ape,  MD  levofloxacin (LEVAQUIN) 500 MG tablet Take 1 tablet (500 mg total) by mouth daily. 03/09/15  Yes Orson Ape, MD  ondansetron (ZOFRAN ODT) 8 MG disintegrating tablet Take 1 tablet (8 mg total) by mouth every 6 (six) hours as needed for nausea or vomiting. 03/09/15  Yes Orson Ape, MD  oxyCODONE-acetaminophen (PERCOCET) 10-325 MG per tablet Take 1-2 tablets by mouth every 4 (four) hours as needed for pain. Maximum dose per  24 hours - 8 pills 03/09/15  Yes Orson Ape, MD  promethazine (PHENERGAN) 25 MG suppository Place 1 suppository (25 mg total) rectally every 6 (six) hours as needed for nausea or vomiting. 03/09/15  Yes Orson Ape, MD  tamsulosin (FLOMAX) 0.4 MG CAPS capsule Take 0.4 mg by mouth at bedtime.  10/19/14  Yes Historical Provider, MD  valsartan (DIOVAN) 160 MG tablet Take 160 mg by mouth daily. 03/03/15  Yes Historical Provider, MD  docusate sodium (COLACE) 100 MG capsule Take 2 capsules (200 mg total) by mouth 2 (two) times daily. 03/09/15   Orson Ape, MD  NUCYNTA 50 MG TABS tablet Take 1 tablet (50 mg total) by mouth every 6 (six) hours as needed. 1 TO 2 TABS Q 6 HOURS PRN PAIN Patient not taking: Reported on 03/11/2015 03/07/15   Orson Ape, MD  NUCYNTA 50 MG TABS tablet Take 1 tablet (50 mg total) by mouth every 6 (six) hours as needed for moderate pain. 1 TO 2 TABS Q 6 HOURS PRN PAIN Patient not taking: Reported on 03/11/2015 03/09/15   Orson Ape, MD   Fam HX:    Family History  Problem Relation Age of Onset  . Cancer Father   . Heart murmur Mother   . Heart attack Brother    Social HX:    Social History   Social History  . Marital Status: Single    Spouse Name: N/A  . Number of Children: N/A  . Years of Education: N/A   Occupational History  . Not on file.   Social History Main Topics  . Smoking status: Current Every Day Smoker -- 0.50 packs/day for 10 years    Types: Cigarettes  . Smokeless tobacco: Never Used  . Alcohol Use: 0.0 oz/week    0 Standard drinks or equivalent per week     Comment: occ  . Drug Use: No  . Sexual Activity: Not on file   Other Topics Concern  . Not on file   Social History Narrative     ROS:  Denies any syncope, bleeding, orthopnea, PND All 11 ROS were addressed and are negative except what is stated in the HPI   Physical Exam: Blood pressure 101/62, pulse 54, temperature 98.2 F (36.8 C), temperature source Oral, resp. rate  15, SpO2 94 %.   General: Well developed, well nourished, in no acute distress Head: Eyes PERRLA, No xanthomas.   Normal cephalic and atramatic  Lungs:  Clear bilaterally to auscultation and percussion. Normal respiratory effort. No wheezes, no rales. Heart:  HRRR S1 S2 Pulses are 2+ & equal. No murmurs, rubs or gallops.             No carotid bruit. No JVD.  No abdominal bruits. Abdomen: Bowel sounds are positive, abdomen soft and non-tender without masses or                 Hernia's noted. No hepatosplenomegaly. Msk:  Back normal, normal gait. Normal strength and tone for age.  Extremities:  No clubbing, cyanosis or edema.  DP +1 Neuro: Alert and oriented X 3, non-focal, MAE x 4 GU: Deferred Rectal: Deferred Psych:  Good affect, responds appropriately         Labs:   Lab Results  Component Value Date   WBC 10.8* 03/11/2015   HGB 14.4 03/11/2015   HCT 42.2 03/11/2015   MCV 95.3 03/11/2015   PLT 154 03/11/2015    Recent Labs Lab 03/11/15 1238  NA 138  K 4.2  CL 105  CO2 25  BUN 14  CREATININE 0.94  CALCIUM 9.1  GLUCOSE 129*   No results for input(s): CKTOTAL, CKMB, TROPONINI in the last 72 hours. Lab Results  Component Value Date   CHOL 149 06/09/2013   HDL 44.20 06/09/2013   LDLCALC 83 06/09/2013   TRIG 108.0 06/09/2013   No results found for: DDIMER   Radiology:  Dg Chest Port 1 View  03/11/2015   CLINICAL DATA:  Midsternal chest pain  EXAM: PORTABLE CHEST - 1 VIEW  COMPARISON:  01/06/2015  FINDINGS: Lung hyperinflation suggesting COPD. Normal heart size and mediastinal contours. There is no edema, consolidation, effusion, or pneumothorax. Remote and healed right clavicle fracture.  IMPRESSION: No active disease.   Electronically Signed   By: Marnee Spring M.D.   On: 03/11/2015 13:10   Personally viewed.   EKG:   Normal sinus rhythm, poor R-wave progression consistent with old anterior infarct pattern. No ST segment changes. Personally viewed. No  significant change from prior EKG in June 2016.  ASSESSMENT/PLAN:   59 year old male with ischemic cardiomyopathy ejection fraction of 20-25% with prior LAD stent, widely patent in July 2016 during cardiac catheterization, 2 days postop urostomy stent placement by urology at [facility] off Plavix here with chest discomfort.  1. Chest pain-has some typical features to it including left arm and nausea. Mildly elevated point-of-care troponin. 0.3. I will order a serum troponin. Prior LAD stent. Prior infarction. We will observe and continue to trend troponin. Since he just had a ureteral stent placed I am concerned about potential bleeding risk with heparin. We will monitor closely. If troponin serum comes back normal, we will discontinue heparin. Recent catheterization in July showed patent coronary stent. If serum troponin is normal or if trend is not significantly elevated, we will optimize medical management unless pain becomes more severe in which case we would consider further testing. Recent cardiac catheterization 01/09/2015. If troponin becomes significantly elevated, he will fallen to classification of non-ST elevation myocardial infarction.  He was wondering if perhaps the pain medication that he was given after his ureteral stent caused his symptoms do develop.  2. Ischemic cardiomyopathy-seems well compensated. Continue home medications. Blood pressure has been an issue as far as up titrating medication. Dr. Katrinka Blazing is going to revisit ICD in the fall. He has seen Dr. Graciela Husbands in the past.  3. Nephrolithiasis-on 03/07/15-ureteral stent placed. Watch for signs of bleeding. If clotting occurs, we will consult urology.   4. Tobacco use-cessation.     Donato Schultz, MD  03/11/2015  2:21 PM

## 2015-03-11 NOTE — ED Provider Notes (Signed)
CSN: [id number]     Arrival date & time 03/11/15  1137 History   First MD Initiated Contact with Patient 03/11/15 1224     Chief Complaint  Patient presents with  . Chest Pain     (Consider location/radiation/quality/duration/timing/severity/associated sxs/prior Treatment) HPI   [name] Walker 59 y.o.male  PCP: Barbette Reichmann, MD  Blood pressure 107/63, pulse 62, temperature 98.2 F (36.8 C), temperature source Oral, resp. rate 17, SpO2 96 %.  SIGNIFICANT PMH: Varicose vein, hypertension, chronic kidney disease, MI, cardiac arrest, CHIEF COMPLAINT: Chest pain  When: At 9 AM this morning How: The patient required some onset of chest pain with left arm numbness. He has been recently taken off his Plavix as approved by his cardiologist Dr. Verdis Prime. He was taken off of his medications to have ureteral stents placed and lithotripsy within this past week. Patient reports hematuria. He is no longer having symptoms to his left arm but it continues to have substernal chest pain that waxes and wanes but has been constant. Chronicity: Acute Location: Substernal Radiation: Was radiating to left arm but since resolved Quality and severity: Pressure squeezing Alleviating factors: None Worsening factors: None Treatments tried: Tylenol Associated Symptoms: None Negative ROS: Confusion, diaphoresis, fever, headache, weakness (general or focal), change of vision,  neck pain, dysphagia, aphagia,    Past Medical History  Diagnosis Date  . Varicose veins   . No pertinent past medical history   . Hypertension   . Chronic kidney disease     stone  . Sleep apnea   . Sleep apnea     to have sleep study  . Coronary artery disease   . MI (myocardial infarction) 06/19/2012    being considered for defibilator   Past Surgical History  Procedure Laterality Date  . Coronary stent placement    . Left heart catheterization with coronary angiogram Right 06/19/2012    Procedure: LEFT HEART  CATHETERIZATION WITH CORONARY ANGIOGRAM;  Surgeon: Lesleigh Noe, MD;  Location: [facility] CATH LAB;  Service: Cardiovascular;  Laterality: Right;  . Percutaneous coronary stent intervention (pci-s) Right 06/19/2012    Procedure: PERCUTANEOUS CORONARY STENT INTERVENTION (PCI-S);  Surgeon: Lesleigh Noe, MD;  Location: [facility] CATH LAB;  Service: Cardiovascular;  Laterality: Right;  . Cardiac catheterization N/A 01/09/2015    Procedure: Left Heart Cath and Coronary Angiography;  Surgeon: Lyn Records, MD;  Location: [facility] INVASIVE CV LAB;  Service: Cardiovascular;  Laterality: N/A;  . Cystoscopy with stent placement Right 03/07/2015    Procedure: CYSTOSCOPY WITH STENT PLACEMENT;  Surgeon: Orson Ape, MD;  Location: [facility] ORS;  Service: Urology;  Laterality: Right;  . Extracorporeal shock wave lithotripsy Right 03/09/2015    Procedure: EXTRACORPOREAL SHOCK WAVE LITHOTRIPSY (ESWL);  Surgeon: Orson Ape, MD;  Location: [facility] ORS;  Service: Urology;  Laterality: Right;   Family History  Problem Relation Age of Onset  . Cancer Father   . Heart murmur Mother   . Heart attack Brother    Social History  Substance Use Topics  . Smoking status: Current Every Day Smoker -- 0.50 packs/day for 10 years    Types: Cigarettes  . Smokeless tobacco: Never Used  . Alcohol Use: 0.0 oz/week    0 Standard drinks or equivalent per week     Comment: occ    Review of Systems  10 Systems reviewed and are negative for acute change except as noted in the HPI.     Allergies  Review of patient's  allergies indicates no known allergies.  Home Medications   Prior to Admission medications   Medication Sig Start Date End Date Taking? Authorizing Provider  albuterol (PROVENTIL HFA;VENTOLIN HFA) 108 (90 BASE) MCG/ACT inhaler Inhale 2 puffs into the lungs every 6 (six) hours as needed for wheezing or shortness of breath (chest tightness). ProAir    Historical Provider, MD  atorvastatin (LIPITOR) 20 MG tablet Take 20  mg by mouth daily.    Historical Provider, MD  carvedilol (COREG) 12.5 MG tablet Take 12.5 mg by mouth 2 (two) times daily with a meal.    Historical Provider, MD  docusate sodium (COLACE) 100 MG capsule Take 2 capsules (200 mg total) by mouth 2 (two) times daily. 03/09/15   Orson Ape, MD  hyoscyamine (LEVSIN/SL) 0.125 MG SL tablet Place 2 tablets (0.25 mg total) under the tongue every 4 (four) hours as needed (bladder spasms). 1-2 TABS 03/07/15   Orson Ape, MD  levofloxacin (LEVAQUIN) 500 MG tablet Take 1 tablet (500 mg total) by mouth daily. 03/09/15   Orson Ape, MD  NUCYNTA 50 MG TABS tablet Take 1 tablet (50 mg total) by mouth every 6 (six) hours as needed. 1 TO 2 TABS Q 6 HOURS PRN PAIN 03/07/15   Orson Ape, MD  NUCYNTA 50 MG TABS tablet Take 1 tablet (50 mg total) by mouth every 6 (six) hours as needed for moderate pain. 1 TO 2 TABS Q 6 HOURS PRN PAIN 03/09/15   Orson Ape, MD  ondansetron (ZOFRAN ODT) 8 MG disintegrating tablet Take 1 tablet (8 mg total) by mouth every 6 (six) hours as needed for nausea or vomiting. 03/09/15   Orson Ape, MD  oxyCODONE-acetaminophen (PERCOCET) 10-325 MG per tablet Take 1 tablet by mouth every 6 (six) hours as needed. for pain 02/15/15   Historical Provider, MD  oxyCODONE-acetaminophen (PERCOCET) 10-325 MG per tablet Take 1-2 tablets by mouth every 4 (four) hours as needed for pain. Maximum dose per 24 hours - 8 pills 03/09/15   Orson Ape, MD  promethazine (PHENERGAN) 25 MG suppository Place 1 suppository (25 mg total) rectally every 6 (six) hours as needed for nausea or vomiting. 03/09/15   Orson Ape, MD  tamsulosin (FLOMAX) 0.4 MG CAPS capsule Take 0.4 mg by mouth at bedtime.  10/19/14   Historical Provider, MD  valsartan-hydrochlorothiazide (DIOVAN-HCT) 160-12.5 MG per tablet Take 1 tablet by mouth daily.    Historical Provider, MD   BP 103/68 mmHg  Pulse 61  Temp(Src) 98.2 F (36.8 C) (Oral)  Resp 15  SpO2 94% Physical Exam   Constitutional: He appears well-developed and well-nourished. No distress (patient does not appear to be in any distress and is not diaphoretic).  HENT:  Head: Normocephalic and atraumatic.  Eyes: Pupils are equal, round, and reactive to light.  Neck: Normal range of motion. Neck supple.  Cardiovascular: Normal rate and regular rhythm.   Pulmonary/Chest: Effort normal.  Tenderness to palpation of chest wall  Abdominal: Soft. Bowel sounds are normal. There is no tenderness. There is no rigidity and no guarding.  Musculoskeletal:  Varicose veins, no lower extremity swelling  Neurological: He is alert.  Skin: Skin is warm and dry.  Nursing note and vitals reviewed.   ED Course  Procedures (including critical care time) Labs Review Labs Reviewed  BASIC METABOLIC PANEL - Abnormal; Notable for the following:    Glucose, Bld 129 (*)    All other components within normal limits  CBC - Abnormal; Notable for the following:    WBC 10.8 (*)    All other components within normal limits  I-STAT TROPOININ, ED - Abnormal; Notable for the following:    Troponin i, poc 0.30 (*)    All other components within normal limits  HEPARIN LEVEL (UNFRACTIONATED)    Imaging Review Dg Chest Port 1 View  03/11/2015   CLINICAL DATA:  Midsternal chest pain  EXAM: PORTABLE CHEST - 1 VIEW  COMPARISON:  01/06/2015  FINDINGS: Lung hyperinflation suggesting COPD. Normal heart size and mediastinal contours. There is no edema, consolidation, effusion, or pneumothorax. Remote and healed right clavicle fracture.  IMPRESSION: No active disease.   Electronically Signed   By: Marnee Spring M.D.   On: 03/11/2015 13:10   I have personally reviewed and evaluated these images and lab results as part of my medical decision-making.   EKG Interpretation   Date/Time:  Saturday March 11 2015 11:45:00 EDT Ventricular Rate:  62 PR Interval:  166 QRS Duration: 88 QT Interval:  422 QTC Calculation: 428 R Axis:    83 Text Interpretation:  Normal sinus rhythm Anteroseptal infarct , age  undetermined Abnormal ECG Since last tracing rate slower Confirmed by  Ethelda Chick  MD, SAM [phone]) on 03/11/2015 12:21:43 PM      MDM   Final diagnoses:  NSTEMI (non-ST elevated myocardial infarction)    CRITICAL CARE Performed by: Dorthula Matas Total critical care time: 30 Critical care time was exclusive of separately billable procedures and treating other patients. Critical care was necessary to treat or prevent imminent or life-threatening deterioration. Critical care was time spent personally by me on the following activities: development of treatment plan with patient and/or surrogate as well as nursing, discussions with consultants, evaluation of patient's response to treatment, examination of patient, obtaining history from patient or surrogate, ordering and performing treatments and interventions, ordering and review of laboratory studies, ordering and review of radiographic studies, pulse oximetry and re-evaluation of patient's condition.  Dr. Ethelda Chick has seen patient as well. He is having an NSTEMI, cardiology has been contacted. Will start Heparin and once blood pressure is improved from fluids will initiated nitro. Patient to be admitted by cardiology.  Medications  0.9 %  sodium chloride infusion ( Intravenous New Bag/Given 03/11/15 1249)  heparin bolus via infusion 4,000 Units (not administered)  heparin ADULT infusion 100 units/mL (25000 units/250 mL) (not administered)  morphine 4 MG/ML injection 4 mg (4 mg Intravenous Given 03/11/15 1249)  ondansetron (ZOFRAN) injection 4 mg (4 mg Intravenous Given 03/11/15 1249)  aspirin chewable tablet 324 mg (324 mg Oral Given 03/11/15 1249)  sodium chloride 0.9 % bolus 1,000 mL (1,000 mLs Intravenous New Bag/Given 03/11/15 1311)     Filed Vitals:   03/11/15 1300  BP: 103/68  Pulse: 61  Temp:   Resp: [address] PA-C 03/11/15 1321  [name], MD 03/11/15 1653

## 2015-03-11 NOTE — ED Notes (Signed)
Pt reports sudden onset of CP and Left arm "numbness" starting at 900 this morning Pt reports recent changes in medication including being taken off of his plavix. Pt has equal strength and sensory in both extremities. NAD at this time. Pt alert x4. Pt does have extensive cardiac hx.

## 2015-03-11 NOTE — Progress Notes (Signed)
    Troponin, serum was 4.5. No chest pain currently. Positive non-ST elevation myocardial infarction.  Plan will be to provide cardiac catheterization on Monday.  Continue with IV heparin. I will hold off on Plavix which he has recently been holding given his recent ureteral stent placement. I'm worried about concomitant use with heparin and worsening bleeding. If significant urologic bleeding occurs, we will consult urology.  Donato Schultz, MD

## 2015-03-11 NOTE — Progress Notes (Signed)
Critical Troponin Level was reported by lab at 4.57.  Dr. Anne Fu was paged. Pt has no c/o chest pain.  Will continue to monitor.

## 2015-03-11 NOTE — ED Provider Notes (Signed)
Complains of left anterior chest pain onset 9 AM today while driving a coming by left arm discomfort and nausea. His discomfort is minimal at present, without treatment.Marland Kitchen Spoke with Dr. Duke Salvia suggest intravenous heparin, fluid boluses patient is mildly hypotensive and then treat with intravenous nitroglycerin. 2:30 PM after treatment After 1 L of normal saline patient is pain-free. Asymptomatic. Intravenous nitroglycerin withheld.  [name], MD 03/11/15 1444

## 2015-03-11 NOTE — Progress Notes (Signed)
Pt troponin posted - 12.74.  Pt currently pain free, asymptomatic, VSS.  EKG done.  Dr Zachery Conch paged/notified.  No new orders at this time.  Will continue to monitor pt closely.  Erenest Rasher, RN

## 2015-03-11 NOTE — Progress Notes (Addendum)
ANTICOAGULATION CONSULT NOTE - Initial Consult  Pharmacy Consult for Heparin Indication: ACS / STEMI  Allergies  Allergen Reactions  . Nucynta [Tapentadol] Nausea Only and Other (See Comments)    Dizziness, woozy    Patient Measurements: TBW - 95 kg IBW - 77.6 kg Heparin dosing weight 95 kg  Vital Signs: Temp: 98.5 F (36.9 C) (09/10 2047) Temp Source: Oral (09/10 2047) BP: 110/86 mmHg (09/10 2047) Pulse Rate: 58 (09/10 2047)  Labs:  Recent Labs  03/11/15 1238 03/11/15 1455 03/11/15 1954 03/11/15 2211  HGB 14.4  --   --   --   HCT 42.2  --   --   --   PLT 154  --   --   --   HEPARINUNFRC  --   --   --  0.14*  CREATININE 0.94  --   --   --   TROPONINI  --  4.57* 12.74*  --     Estimated Creatinine Clearance: 104 mL/min (by C-G formula based on Cr of 0.94).   Medical History: Past Medical History  Diagnosis Date  . Varicose veins   . No pertinent past medical history   . Hypertension   . Chronic kidney disease     stone  . Sleep apnea   . Sleep apnea     to have sleep study  . Coronary artery disease   . MI (myocardial infarction) 06/19/2012    being considered for defibilator    Assessment: 59 yo M presents on 9/10 with chest pain and left arm discomfort, on IV heparin. Possible cath on Monday. Heparin level 0.14, subtherapeutic on 1200 units/hr. No interruption with infusion, no bleeding noted.  Goal of Therapy:  Heparin level 0.3-0.7 units/ml Monitor platelets by anticoagulation protocol: Yes   Plan:  Rebolus 3000 units Increa heparin gtt to 1500 units/hr Check 6 hr HL Monitor daily HL, CBC, s/s of bleed   Bayard Hugger, PharmD, BCPS  Clinical Pharmacist  Pager: [phone]   03/11/2015,10:53 PM  Addendum:  Heparin level is now 0.38, therapeutic on 1500 units/hr  - Continue at current rate - Recheck at 1100 to confirm  Bayard Hugger, PharmD, BCPS  Clinical Pharmacist  Pager: [phone]

## 2015-03-11 NOTE — Progress Notes (Signed)
ANTICOAGULATION CONSULT NOTE - Initial Consult  Pharmacy Consult for Heparin Indication: ACS / STEMI  No Known Allergies  Patient Measurements: TBW - 95 kg IBW - 77.6 kg Heparin dosing weight 95 kg  Vital Signs: Temp: 98.2 F (36.8 C) (09/10 1141) Temp Source: Oral (09/10 1141) BP: 107/63 mmHg (09/10 1233) Pulse Rate: 62 (09/10 1233)  Labs:  Recent Labs  03/11/15 1238  HGB 14.4  HCT 42.2  PLT 154    CrCl cannot be calculated (Patient has no serum creatinine result on file.).   Medical History: Past Medical History  Diagnosis Date  . Varicose veins   . No pertinent past medical history   . Hypertension   . Chronic kidney disease     stone  . Sleep apnea   . Sleep apnea     to have sleep study  . Coronary artery disease   . MI (myocardial infarction) 06/19/2012    being considered for defibilator    Assessment: 59 yo M presents on 9/10 with chest pain and left arm discomfort. Pharmacy consulted to dose heparin for ACS / STEMI.  CBC stable. Bmet pending. No anticoag at home and no hx of CKD.  Goal of Therapy:  Heparin level 0.3-0.7 units/ml Monitor platelets by anticoagulation protocol: Yes   Plan:  Give heparin 4,000 units BOLUS Start heparin gtt at 1200 units/hr Check 6 hr HL Monitor daily HL, CBC, s/s of bleed   [name] Walker J 03/11/2015,1:10 PM

## 2015-03-12 DIAGNOSIS — N200 Calculus of kidney: Secondary | ICD-10-CM | POA: Diagnosis present

## 2015-03-12 DIAGNOSIS — I2510 Atherosclerotic heart disease of native coronary artery without angina pectoris: Secondary | ICD-10-CM | POA: Diagnosis present

## 2015-03-12 DIAGNOSIS — F17210 Nicotine dependence, cigarettes, uncomplicated: Secondary | ICD-10-CM | POA: Diagnosis present

## 2015-03-12 DIAGNOSIS — I5022 Chronic systolic (congestive) heart failure: Secondary | ICD-10-CM

## 2015-03-12 DIAGNOSIS — I255 Ischemic cardiomyopathy: Secondary | ICD-10-CM | POA: Diagnosis present

## 2015-03-12 DIAGNOSIS — R0789 Other chest pain: Secondary | ICD-10-CM | POA: Diagnosis present

## 2015-03-12 DIAGNOSIS — I252 Old myocardial infarction: Secondary | ICD-10-CM | POA: Diagnosis not present

## 2015-03-12 DIAGNOSIS — I214 Non-ST elevation (NSTEMI) myocardial infarction: Secondary | ICD-10-CM | POA: Diagnosis present

## 2015-03-12 DIAGNOSIS — Z79899 Other long term (current) drug therapy: Secondary | ICD-10-CM | POA: Diagnosis not present

## 2015-03-12 DIAGNOSIS — G4733 Obstructive sleep apnea (adult) (pediatric): Secondary | ICD-10-CM | POA: Diagnosis present

## 2015-03-12 DIAGNOSIS — R079 Chest pain, unspecified: Secondary | ICD-10-CM | POA: Diagnosis not present

## 2015-03-12 DIAGNOSIS — Z7902 Long term (current) use of antithrombotics/antiplatelets: Secondary | ICD-10-CM | POA: Diagnosis not present

## 2015-03-12 DIAGNOSIS — Z955 Presence of coronary angioplasty implant and graft: Secondary | ICD-10-CM | POA: Diagnosis not present

## 2015-03-12 DIAGNOSIS — I10 Essential (primary) hypertension: Secondary | ICD-10-CM | POA: Diagnosis present

## 2015-03-12 DIAGNOSIS — I25110 Atherosclerotic heart disease of native coronary artery with unstable angina pectoris: Secondary | ICD-10-CM | POA: Diagnosis not present

## 2015-03-12 DIAGNOSIS — E785 Hyperlipidemia, unspecified: Secondary | ICD-10-CM | POA: Diagnosis present

## 2015-03-12 LAB — BASIC METABOLIC PANEL
Anion gap: 4 — ABNORMAL LOW (ref 5–15)
BUN: 8 mg/dL (ref 6–20)
CO2: 24 mmol/L (ref 22–32)
Calcium: 8.5 mg/dL — ABNORMAL LOW (ref 8.9–10.3)
Chloride: 112 mmol/L — ABNORMAL HIGH (ref 101–111)
Creatinine, Ser: 0.83 mg/dL (ref 0.61–1.24)
GFR calc Af Amer: >60 mL/min (ref >60)
GFR calc non Af Amer: >60 mL/min (ref >60)
Glucose, Bld: 111 mg/dL — ABNORMAL HIGH (ref 65–99)
Potassium: 3.8 mmol/L (ref 3.5–5.1)
Sodium: 140 mmol/L (ref 135–145)

## 2015-03-12 LAB — CBC
HCT: 37.4 % — ABNORMAL LOW (ref 39.0–52.0)
Hemoglobin: 12.8 g/dL — ABNORMAL LOW (ref 13.0–17.0)
MCH: 32.7 pg (ref 26.0–34.0)
MCHC: 34.2 g/dL (ref 30.0–36.0)
MCV: 95.4 fL (ref 78.0–100.0)
Platelets: 132 10*3/uL — ABNORMAL LOW (ref 150–400)
RBC: 3.92 MIL/uL — ABNORMAL LOW (ref 4.22–5.81)
RDW: 13.2 % (ref 11.5–15.5)
WBC: 8.1 10*3/uL (ref 4.0–10.5)

## 2015-03-12 LAB — LIPID PANEL
Cholesterol: 111 mg/dL (ref 0–200)
HDL: 32 mg/dL — ABNORMAL LOW (ref >40)
LDL Cholesterol: 55 mg/dL (ref 0–99)
Total CHOL/HDL Ratio: 3.5 RATIO
Triglycerides: 121 mg/dL (ref <150)
VLDL: 24 mg/dL (ref 0–40)

## 2015-03-12 LAB — HEPARIN LEVEL (UNFRACTIONATED)
Heparin Unfractionated: 0.38 IU/mL (ref 0.30–0.70)
Heparin Unfractionated: 0.3 IU/mL (ref 0.30–0.70)

## 2015-03-12 LAB — TROPONIN I: Troponin I: 7.47 ng/mL (ref <0.031)

## 2015-03-12 MED ORDER — ASPIRIN 81 MG PO CHEW
81.0000 mg | CHEWABLE_TABLET | ORAL | Status: AC
  Administered 2015-03-13: 81 mg via ORAL
  Filled 2015-03-12: qty 1

## 2015-03-12 MED ORDER — HEPARIN (PORCINE) IN NACL 100-0.45 UNIT/ML-% IJ SOLN
1800.0000 [IU]/h | INTRAMUSCULAR | Status: DC
  Administered 2015-03-13: 1600 [IU]/h via INTRAVENOUS
  Filled 2015-03-12: qty 250

## 2015-03-12 MED ORDER — SODIUM CHLORIDE 0.9 % IJ SOLN: 3.0000 mL | Freq: Two times a day (BID) | INTRAMUSCULAR | Status: DC

## 2015-03-12 MED ORDER — SODIUM CHLORIDE 0.9 % IV SOLN: INTRAVENOUS | Status: DC

## 2015-03-12 MED ORDER — ATORVASTATIN CALCIUM 40 MG PO TABS
40.0000 mg | ORAL_TABLET | Freq: Every day | ORAL | Status: DC
  Administered 2015-03-12 – 2015-03-13 (×2): 40 mg via ORAL
  Filled 2015-03-12 (×3): qty 1

## 2015-03-12 MED ORDER — SODIUM CHLORIDE 0.9 % IJ SOLN: 3.0000 mL | INTRAMUSCULAR | Status: DC | PRN

## 2015-03-12 MED ORDER — SODIUM CHLORIDE 0.9 % IV SOLN: 250.0000 mL | INTRAVENOUS | Status: DC | PRN

## 2015-03-12 NOTE — Progress Notes (Signed)
ANTICOAGULATION CONSULT NOTE - Follow Up Consult  Pharmacy Consult for Heparin Indication: chest pain/ACS  Allergies  Allergen Reactions  . Nucynta [Tapentadol] Nausea Only and Other (See Comments)    Dizziness, woozy    Patient Measurements: Height: 6' (182.9 cm) Weight: 209 lb 12.8 oz (95.165 kg) IBW/kg (Calculated) : 77.6  Vital Signs: Temp: 98 F (36.7 C) (09/11 0300) Temp Source: Oral (09/11 0300) BP: 99/63 mmHg (09/11 0300) Pulse Rate: 59 (09/11 0300)  Labs:  Recent Labs  03/11/15 1238  03/11/15 1954 03/11/15 2211 03/12/15 0419 03/12/15 0420 03/12/15 1047  HGB 14.4  --   --   --   --  12.8*  --   HCT 42.2  --   --   --   --  37.4*  --   PLT 154  --   --   --   --  132*  --   HEPARINUNFRC  --   --   --  0.14* 0.38  --  0.30  CREATININE 0.94  --   --   --   --  0.83  --   TROPONINI  --   < > 12.74* 7.94*  --  7.47*  --   < > = values in this interval not displayed.  Estimated Creatinine Clearance: 117.5 mL/min (by C-G formula based on Cr of 0.83).   Medications:  Heparin @ 1500 units/hr  Assessment: 58yom continues on heparin for NSTEMI with plans for cath tomorrow. Confirmatory heparin level is therapeutic on the low end at 0.30. CBC is stable. No bleeding reported other than some red tinged urine s/p uretal stent placement which is to be expected.   Goal of Therapy:  Heparin level 0.3-0.7 units/ml Monitor platelets by anticoagulation protocol: Yes   Plan:  1) Increase heparin to 1600 units/hr 2) Heparin level, CBC in AM 3) Cath tomorrow  Fredrik Rigger 03/12/2015,11:41 AM

## 2015-03-12 NOTE — Progress Notes (Signed)
Subjective:  No chest pain since yesterday. No SOB. Minor redness to urine post ureteral stent (to be expected)  Objective:  Vital Signs in the last 24 hours: Temp:  [98 F (36.7 C)-98.5 F (36.9 C)] 98 F (36.7 C) (09/11 0300) Pulse Rate:  [54-66] 59 (09/11 0300) Resp:  [15-20] 18 (09/10 1611) BP: (99-124)/(60-86) 99/63 mmHg (09/11 0300) SpO2:  [93 %-98 %] 97 % (09/11 0300) Weight:  [209 lb 12.8 oz (95.165 kg)-210 lb 12.2 oz (95.6 kg)] 209 lb 12.8 oz (95.165 kg) (09/11 0300)  Intake/Output from previous day: 09/10 0701 - 09/11 0700 In: 240 [P.O.:240] Out: 550 [Urine:550]   Physical Exam: General: Well developed, well nourished, in no acute distress. Head:  Normocephalic and atraumatic. Lungs: Clear to auscultation and percussion. Heart: Normal S1 and S2.  No murmur, rubs or gallops.  Abdomen: soft, non-tender, positive bowel sounds. Extremities: No clubbing or cyanosis. No edema. Neurologic: Alert and oriented x 3.    Lab Results:  Recent Labs  03/11/15 1238 03/12/15 0420  WBC 10.8* 8.1  HGB 14.4 12.8*  PLT 154 132*    Recent Labs  03/11/15 1238 03/12/15 0420  NA 138 140  K 4.2 3.8  CL 105 112*  CO2 25 24  GLUCOSE 129* 111*  BUN 14 8  CREATININE 0.94 0.83    Recent Labs  03/11/15 2211 03/12/15 0420  TROPONINI 7.94* 7.47*   Hepatic Function Panel No results for input(s): PROT, ALBUMIN, AST, ALT, ALKPHOS, BILITOT, BILIDIR, IBILI in the last 72 hours.  Recent Labs  03/12/15 0420  CHOL 111   No results for input(s): PROTIME in the last 72 hours.  Imaging: Dg Chest Port 1 View  03/11/2015   CLINICAL DATA:  Midsternal chest pain  EXAM: PORTABLE CHEST - 1 VIEW  COMPARISON:  01/06/2015  FINDINGS: Lung hyperinflation suggesting COPD. Normal heart size and mediastinal contours. There is no edema, consolidation, effusion, or pneumothorax. Remote and healed right clavicle fracture.  IMPRESSION: No active disease.   Electronically Signed   By:  Marnee Spring M.D.   On: 03/11/2015 13:10   Personally viewed.   Telemetry: No adverse rhythms Personally viewed.   EKG:  No ST changes.   Cardiac Studies:  Cath P, ECHO P  Scheduled Meds: . aspirin  324 mg Oral NOW   Or  . aspirin  300 mg Rectal NOW  . aspirin EC  81 mg Oral Daily  . atorvastatin  20 mg Oral q1800  . carvedilol  12.5 mg Oral BID WC  . docusate sodium  100 mg Oral BID  . irbesartan  75 mg Oral Daily  . tamsulosin  0.4 mg Oral QHS   Continuous Infusions: . sodium chloride 125 mL/hr at 03/11/15 2050  . heparin 1,500 Units/hr (03/12/15 0804)   PRN Meds:.acetaminophen, nitroGLYCERIN, ondansetron (ZOFRAN) IV, oxyCODONE-acetaminophen   Assessment/Plan:  Active Problems:   Old MI (myocardial infarction)   Chronic systolic heart failure   Coronary atherosclerosis of native coronary artery   Kidney stone on right side   Chest pain   NSTEMI (non-ST elevated myocardial infarction)  59 year old male with ischemic cardiomyopathy ejection fraction of 20-25% with prior LAD stent, widely patent in July 2016 (Dr. Katrinka Blazing) during cardiac catheterization, 3 days postop urostomy stent placement by urology at [facility] off Plavix here with transient chest discomfort, now with elevated Troponin consistent with NSTEMI.   1. NSTEMI  - Heparin IV  - Coreg  - atorvastatin (will increase to  40, high intensity)  - ASA  - ARB  - No significant bleeding from ureteral stent/ lithotripsy. Purposefully holding Plavix until anatomy is defined to deter further bleeding complication from recent urologic procedure. Depending on coronary anatomy on cath tomorrow, can restart Plavix. Understands risks and benefits (stroke, MI, bleeding, renal).  2. Ischemic cardiomyopathy  - Bb, ARB. Stable. Compensated  - Will be discussing ICD with Dr. Graciela Husbands once again soon.   3. CAD  - prior LAD stent (patent on recent cath late July 2016).   4. Nephrolithiasis  - 9/8 - [facility] - stent  ureteral right placed. Off Plavix. No ST elevation. Watching for any worsening bleeding.    Itati Brocksmith 03/12/2015, 9:11 AM

## 2015-03-12 NOTE — Progress Notes (Signed)
Utilization review completed.  

## 2015-03-13 ENCOUNTER — Inpatient Hospital Stay (HOSPITAL_COMMUNITY): Payer: PRIVATE HEALTH INSURANCE

## 2015-03-13 ENCOUNTER — Encounter (HOSPITAL_COMMUNITY): Payer: Self-pay | Admitting: Student

## 2015-03-13 ENCOUNTER — Encounter (HOSPITAL_COMMUNITY)
Admission: EM | Disposition: A | Discharge status: Home / Self Care | Payer: Self-pay | Source: Home / Self Care | Attending: Cardiology

## 2015-03-13 DIAGNOSIS — E785 Hyperlipidemia, unspecified: Secondary | ICD-10-CM

## 2015-03-13 HISTORY — PX: CARDIAC CATHETERIZATION: SHX172

## 2015-03-13 LAB — CBC
HCT: 35.1 % — ABNORMAL LOW (ref 39.0–52.0)
HCT: 37.4 % — ABNORMAL LOW (ref 39.0–52.0)
Hemoglobin: 12 g/dL — ABNORMAL LOW (ref 13.0–17.0)
Hemoglobin: 12.9 g/dL — ABNORMAL LOW (ref 13.0–17.0)
MCH: 32.4 pg (ref 26.0–34.0)
MCH: 32.4 pg (ref 26.0–34.0)
MCHC: 34.2 g/dL (ref 30.0–36.0)
MCHC: 34.5 g/dL (ref 30.0–36.0)
MCV: 94.9 fL (ref 78.0–100.0)
MCV: 94 fL (ref 78.0–100.0)
Platelets: 122 10*3/uL — ABNORMAL LOW (ref 150–400)
Platelets: 144 K/uL — ABNORMAL LOW (ref 150–400)
RBC: 3.7 MIL/uL — ABNORMAL LOW (ref 4.22–5.81)
RBC: 3.98 MIL/uL — ABNORMAL LOW (ref 4.22–5.81)
RDW: 13 % (ref 11.5–15.5)
RDW: 12.8 % (ref 11.5–15.5)
WBC: 5.9 10*3/uL (ref 4.0–10.5)
WBC: 6.3 K/uL (ref 4.0–10.5)

## 2015-03-13 LAB — HEPARIN LEVEL (UNFRACTIONATED)
Heparin Unfractionated: 0.21 IU/mL — ABNORMAL LOW (ref 0.30–0.70)
Heparin Unfractionated: 0.42 IU/mL (ref 0.30–0.70)

## 2015-03-13 LAB — CREATININE, SERUM
Creatinine, Ser: 0.76 mg/dL (ref 0.61–1.24)
GFR calc Af Amer: >60 mL/min (ref >60)
GFR calc non Af Amer: >60 mL/min (ref >60)

## 2015-03-13 LAB — PROTIME-INR
INR: 1.1 (ref 0.00–1.49)
Prothrombin Time: 14.4 seconds (ref 11.6–15.2)

## 2015-03-13 SURGERY — LEFT HEART CATH AND CORONARY ANGIOGRAPHY
Anesthesia: LOCAL

## 2015-03-13 MED ORDER — HEPARIN (PORCINE) IN NACL 2-0.9 UNIT/ML-% IJ SOLN
INTRAMUSCULAR | Status: DC | PRN
  Administered 2015-03-13: 13:00:00

## 2015-03-13 MED ORDER — LIDOCAINE HCL (PF) 1 % IJ SOLN
INTRAMUSCULAR | Status: DC | PRN
Start: 2015-03-13 — End: 2015-03-13
  Administered 2015-03-13: 2 mL

## 2015-03-13 MED ORDER — HEPARIN (PORCINE) IN NACL 2-0.9 UNIT/ML-% IJ SOLN
INTRAMUSCULAR | Status: AC
  Filled 2015-03-13: qty 1500

## 2015-03-13 MED ORDER — SODIUM CHLORIDE 0.9 % IV SOLN: 250.0000 mL | INTRAVENOUS | Status: DC | PRN

## 2015-03-13 MED ORDER — MIDAZOLAM HCL 2 MG/2ML IJ SOLN
INTRAMUSCULAR | Status: AC
  Filled 2015-03-13: qty 4

## 2015-03-13 MED ORDER — VERAPAMIL HCL 2.5 MG/ML IV SOLN
INTRAVENOUS | Status: AC
  Filled 2015-03-13: qty 2

## 2015-03-13 MED ORDER — SODIUM CHLORIDE 0.9 % IJ SOLN: 3.0000 mL | INTRAMUSCULAR | Status: DC | PRN

## 2015-03-13 MED ORDER — FENTANYL CITRATE (PF) 100 MCG/2ML IJ SOLN
INTRAMUSCULAR | Status: DC | PRN
  Administered 2015-03-13 (×2): 50 ug via INTRAVENOUS

## 2015-03-13 MED ORDER — MIDAZOLAM HCL 2 MG/2ML IJ SOLN
INTRAMUSCULAR | Status: DC | PRN
  Administered 2015-03-13 (×2): 1 mg via INTRAVENOUS

## 2015-03-13 MED ORDER — HEPARIN SODIUM (PORCINE) 5000 UNIT/ML IJ SOLN
5000.0000 [IU] | Freq: Three times a day (TID) | INTRAMUSCULAR | Status: DC
  Administered 2015-03-13 – 2015-03-14 (×3): 5000 [IU] via SUBCUTANEOUS
  Filled 2015-03-13 (×6): qty 1

## 2015-03-13 MED ORDER — IOHEXOL 350 MG/ML SOLN
INTRAVENOUS | Status: DC | PRN
  Administered 2015-03-13: 100 mL via INTRAVENOUS

## 2015-03-13 MED ORDER — HEPARIN SODIUM (PORCINE) 1000 UNIT/ML IJ SOLN
INTRAMUSCULAR | Status: DC | PRN
  Administered 2015-03-13: 4000 [IU] via INTRAVENOUS

## 2015-03-13 MED ORDER — VERAPAMIL HCL 2.5 MG/ML IV SOLN
INTRAVENOUS | Status: DC | PRN
  Administered 2015-03-13: 13:00:00 via INTRA_ARTERIAL

## 2015-03-13 MED ORDER — HEPARIN SODIUM (PORCINE) 1000 UNIT/ML IJ SOLN
INTRAMUSCULAR | Status: AC
  Filled 2015-03-13: qty 1

## 2015-03-13 MED ORDER — SODIUM CHLORIDE 0.9 % IJ SOLN
3.0000 mL | Freq: Two times a day (BID) | INTRAMUSCULAR | Status: DC
Start: 2015-03-13 — End: 2015-03-14
  Administered 2015-03-13 – 2015-03-14 (×3): 3 mL via INTRAVENOUS

## 2015-03-13 MED ORDER — LIDOCAINE HCL (PF) 1 % IJ SOLN
INTRAMUSCULAR | Status: AC
  Filled 2015-03-13: qty 30

## 2015-03-13 MED ORDER — SODIUM CHLORIDE 0.9 % IV SOLN: INTRAVENOUS | Status: DC | PRN

## 2015-03-13 MED ORDER — SODIUM CHLORIDE 0.9 % WEIGHT BASED INFUSION
1.0000 mL/kg/h | INTRAVENOUS | Status: AC
  Administered 2015-03-13: 1 mL/kg/h via INTRAVENOUS

## 2015-03-13 MED ORDER — FENTANYL CITRATE (PF) 100 MCG/2ML IJ SOLN
INTRAMUSCULAR | Status: AC
  Filled 2015-03-13: qty 4

## 2015-03-13 SURGICAL SUPPLY — 9 items
CATH INFINITI 5 FR JL3.5 (CATHETERS) ×2 IMPLANT
CATH INFINITI JR4 5F (CATHETERS) ×2 IMPLANT
DEVICE RAD COMP TR BAND LRG (VASCULAR PRODUCTS) ×2 IMPLANT
GLIDESHEATH SLEND A-KIT 6F 22G (SHEATH) ×2 IMPLANT
KIT HEART LEFT (KITS) ×2 IMPLANT
PACK CARDIAC CATHETERIZATION (CUSTOM PROCEDURE TRAY) ×2 IMPLANT
TRANSDUCER W/STOPCOCK (MISCELLANEOUS) ×2 IMPLANT
TUBING CIL FLEX 10 FLL-RA (TUBING) ×2 IMPLANT
WIRE SAFE-T 1.5MM-J .035X260CM (WIRE) ×2 IMPLANT

## 2015-03-13 NOTE — Progress Notes (Signed)
ANTICOAGULATION CONSULT NOTE - Follow Up Consult  Pharmacy Consult for Heparin Indication: chest pain/ACS  Allergies  Allergen Reactions  . Nucynta [Tapentadol] Nausea Only and Other (See Comments)    Dizziness, woozy    Patient Measurements: Height: 6' (182.9 cm) Weight: 210 lb 1.6 oz (95.3 kg) IBW/kg (Calculated) : 77.6  Vital Signs: Temp: 98.1 F (36.7 C) (09/12 0525) Temp Source: Oral (09/12 0525) BP: 119/69 mmHg (09/12 0525) Pulse Rate: 56 (09/12 0525)  Labs:  Recent Labs  03/11/15 1238  03/11/15 1954 03/11/15 2211  03/12/15 0420 03/12/15 1047 03/13/15 0248 03/13/15 0500 03/13/15 1029  HGB 14.4  --   --   --   --  12.8*  --  12.0*  --   --   HCT 42.2  --   --   --   --  37.4*  --  35.1*  --   --   PLT 154  --   --   --   --  132*  --  122*  --   --   LABPROT  --   --   --   --   --   --   --  14.4  --   --   INR  --   --   --   --   --   --   --  1.10  --   --   HEPARINUNFRC  --   --   --  0.14*  < >  --  0.30  --  0.21* 0.42  CREATININE 0.94  --   --   --   --  0.83  --   --   --   --   TROPONINI  --   < > 12.74* 7.94*  --  7.47*  --   --   --   --   < > = values in this interval not displayed.  Estimated Creatinine Clearance: 117.6 mL/min (by C-G formula based on Cr of 0.83).  Assessment: 58yom continues on heparin for NSTEMI with plans for cath today at 1330. Heparin level is now therapeutic. No issues with line or bleeding reported per RN.   Goal of Therapy:  Heparin level 0.3-0.7 units/ml Monitor platelets by anticoagulation protocol: Yes   Plan:  - Continue heparin gtt 1800 units/hr - F/u post-cath plans  Lysle Pearl, PharmD, BCPS Pager # [phone] 03/13/2015 11:55 AM

## 2015-03-13 NOTE — H&P (View-Only) (Signed)
Subjective:  No chest pain in the last two days. No SOB.   Objective:  Vital Signs in the last 24 hours: Temp:  [98.1 F (36.7 C)-98.3 F (36.8 C)] 98.1 F (36.7 C) (09/12 0525) Pulse Rate:  [56-65] 56 (09/12 0525) Resp:  [18] 18 (09/12 0525) BP: (107-119)/(69-71) 119/69 mmHg (09/12 0525) SpO2:  [95 %-99 %] 99 % (09/12 0525) Weight:  [210 lb 1.6 oz (95.3 kg)] 210 lb 1.6 oz (95.3 kg) (09/12 0525)  Intake/Output from previous day: 09/11 0701 - 09/12 0700 In: 1200 [P.O.:1200] Out: 1751 [Urine:1750; Stool:1]   Physical Exam: General: Well developed, well nourished, in no acute distress. Head:  Normocephalic and atraumatic. Lungs: Clear to auscultation and percussion. Heart: Normal S1 and S2.  No murmur, rubs or gallops.  Abdomen: soft, non-tender, positive bowel sounds. Extremities: No clubbing or cyanosis. No edema. Neurologic: Alert and oriented x 3.    Lab Results:  Recent Labs  03/12/15 0420 03/13/15 0248  WBC 8.1 5.9  HGB 12.8* 12.0*  PLT 132* 122*    Recent Labs  03/11/15 1238 03/12/15 0420  NA 138 140  K 4.2 3.8  CL 105 112*  CO2 25 24  GLUCOSE 129* 111*  BUN 14 8  CREATININE 0.94 0.83    Recent Labs  03/11/15 2211 03/12/15 0420  TROPONINI 7.94* 7.47*   Hepatic Function Panel No results for input(s): PROT, ALBUMIN, AST, ALT, ALKPHOS, BILITOT, BILIDIR, IBILI in the last 72 hours.  Recent Labs  03/12/15 0420  CHOL 111   No results for input(s): PROTIME in the last 72 hours.  Imaging: Dg Chest Port 1 View  03/11/2015   CLINICAL DATA:  Midsternal chest pain  EXAM: PORTABLE CHEST - 1 VIEW  COMPARISON:  01/06/2015  FINDINGS: Lung hyperinflation suggesting COPD. Normal heart size and mediastinal contours. There is no edema, consolidation, effusion, or pneumothorax. Remote and healed right clavicle fracture.  IMPRESSION: No active disease.   Electronically Signed   By: Marnee Spring M.D.   On: 03/11/2015 13:10   Personally viewed.    Telemetry: No adverse rhythms Personally viewed.   EKG:  No ST changes.   Cardiac Studies:  Cath P, ECHO P  Scheduled Meds: . aspirin EC  81 mg Oral Daily  . atorvastatin  40 mg Oral q1800  . carvedilol  12.5 mg Oral BID WC  . docusate sodium  100 mg Oral BID  . irbesartan  75 mg Oral Daily  . sodium chloride  3 mL Intravenous Q12H  . tamsulosin  0.4 mg Oral QHS   Continuous Infusions: . sodium chloride 125 mL/hr at 03/12/15 2020  . sodium chloride    . heparin Stopped (03/13/15 1208)   PRN Meds:.sodium chloride, acetaminophen, nitroGLYCERIN, ondansetron (ZOFRAN) IV, oxyCODONE-acetaminophen, sodium chloride   Assessment/Plan:  Active Problems:   Old MI (myocardial infarction)   Chronic systolic heart failure   Essential hypertension, benign   Dyslipidemia   Coronary atherosclerosis of native coronary artery   Kidney stone on right side   Chest pain   NSTEMI (non-ST elevated myocardial infarction)  59 year old male with ischemic cardiomyopathy ejection fraction of 20-25% with prior LAD stent, widely patent in July 2016 (Dr. Katrinka Blazing) during cardiac catheterization, 3 days postop urostomy stent placement by urology at [facility] off Plavix here with transient chest discomfort, now with elevated Troponin consistent with NSTEMI.   1. NSTEMI  - Heparin IV  - Coreg  - atorvastatin (will increase to 40, high intensity)  -  ASA  - ARB  - No significant bleeding from ureteral stent/ lithotripsy. Purposefully holding Plavix until anatomy is defined to deter further bleeding complication from recent urologic procedure. Depending on coronary anatomy on cath tomorrow, can restart Plavix. Understands risks and benefits (stroke, MI, bleeding, renal). - cath today  2. Ischemic cardiomyopathy  - Bb, ARB. Stable. Compensated  - Will be discussing ICD with Dr. Graciela Husbands once again soon.   3. CAD  - prior LAD stent (patent on recent cath late July 2016).   4. Nephrolithiasis  -  9/8 - [place] - stent ureteral right placed. Off Plavix. No ST elevation. Watching for any worsening bleeding.    [name] Walker 03/13/2015, 12:35 PM

## 2015-03-13 NOTE — Progress Notes (Signed)
Subjective:  No chest pain in the last two days. No SOB.   Objective:  Vital Signs in the last 24 hours: Temp:  [98.1 F (36.7 C)-98.3 F (36.8 C)] 98.1 F (36.7 C) (09/12 0525) Pulse Rate:  [56-65] 56 (09/12 0525) Resp:  [18] 18 (09/12 0525) BP: (107-119)/(69-71) 119/69 mmHg (09/12 0525) SpO2:  [95 %-99 %] 99 % (09/12 0525) Weight:  [210 lb 1.6 oz (95.3 kg)] 210 lb 1.6 oz (95.3 kg) (09/12 0525)  Intake/Output from previous day: 09/11 0701 - 09/12 0700 In: 1200 [P.O.:1200] Out: 1751 [Urine:1750; Stool:1]   Physical Exam: General: Well developed, well nourished, in no acute distress. Head:  Normocephalic and atraumatic. Lungs: Clear to auscultation and percussion. Heart: Normal S1 and S2.  No murmur, rubs or gallops.  Abdomen: soft, non-tender, positive bowel sounds. Extremities: No clubbing or cyanosis. No edema. Neurologic: Alert and oriented x 3.    Lab Results:  Recent Labs  03/12/15 0420 03/13/15 0248  WBC 8.1 5.9  HGB 12.8* 12.0*  PLT 132* 122*    Recent Labs  03/11/15 1238 03/12/15 0420  NA 138 140  K 4.2 3.8  CL 105 112*  CO2 25 24  GLUCOSE 129* 111*  BUN 14 8  CREATININE 0.94 0.83    Recent Labs  03/11/15 2211 03/12/15 0420  TROPONINI 7.94* 7.47*   Hepatic Function Panel No results for input(s): PROT, ALBUMIN, AST, ALT, ALKPHOS, BILITOT, BILIDIR, IBILI in the last 72 hours.  Recent Labs  03/12/15 0420  CHOL 111   No results for input(s): PROTIME in the last 72 hours.  Imaging: Dg Chest Port 1 View  03/11/2015   CLINICAL DATA:  Midsternal chest pain  EXAM: PORTABLE CHEST - 1 VIEW  COMPARISON:  01/06/2015  FINDINGS: Lung hyperinflation suggesting COPD. Normal heart size and mediastinal contours. There is no edema, consolidation, effusion, or pneumothorax. Remote and healed right clavicle fracture.  IMPRESSION: No active disease.   Electronically Signed   By: Marnee Spring M.D.   On: 03/11/2015 13:10   Personally viewed.    Telemetry: No adverse rhythms Personally viewed.   EKG:  No ST changes.   Cardiac Studies:  Cath P, ECHO P  Scheduled Meds: . aspirin EC  81 mg Oral Daily  . atorvastatin  40 mg Oral q1800  . carvedilol  12.5 mg Oral BID WC  . docusate sodium  100 mg Oral BID  . irbesartan  75 mg Oral Daily  . sodium chloride  3 mL Intravenous Q12H  . tamsulosin  0.4 mg Oral QHS   Continuous Infusions: . sodium chloride 125 mL/hr at 03/12/15 2020  . sodium chloride    . heparin Stopped (03/13/15 1208)   PRN Meds:.sodium chloride, acetaminophen, nitroGLYCERIN, ondansetron (ZOFRAN) IV, oxyCODONE-acetaminophen, sodium chloride   Assessment/Plan:  Active Problems:   Old MI (myocardial infarction)   Chronic systolic heart failure   Essential hypertension, benign   Dyslipidemia   Coronary atherosclerosis of native coronary artery   Kidney stone on right side   Chest pain   NSTEMI (non-ST elevated myocardial infarction)  59 year old male with ischemic cardiomyopathy ejection fraction of 20-25% with prior LAD stent, widely patent in July 2016 (Dr. Katrinka Blazing) during cardiac catheterization, 3 days postop urostomy stent placement by urology at [facility] off Plavix here with transient chest discomfort, now with elevated Troponin consistent with NSTEMI.   1. NSTEMI  - Heparin IV  - Coreg  - atorvastatin (will increase to 40, high intensity)  -  ASA  - ARB  - No significant bleeding from ureteral stent/ lithotripsy. Purposefully holding Plavix until anatomy is defined to deter further bleeding complication from recent urologic procedure. Depending on coronary anatomy on cath tomorrow, can restart Plavix. Understands risks and benefits (stroke, MI, bleeding, renal). - cath today  2. Ischemic cardiomyopathy  - Bb, ARB. Stable. Compensated  - Will be discussing ICD with Dr. Graciela Husbands once again soon.   3. CAD  - prior LAD stent (patent on recent cath late July 2016).   4. Nephrolithiasis  -  9/8 - [place] - stent ureteral right placed. Off Plavix. No ST elevation. Watching for any worsening bleeding.    Lars Masson 03/13/2015, 12:35 PM

## 2015-03-13 NOTE — Progress Notes (Signed)
ANTICOAGULATION CONSULT NOTE - Follow Up Consult  Pharmacy Consult for Heparin Indication: chest pain/ACS  Allergies  Allergen Reactions  . Nucynta [Tapentadol] Nausea Only and Other (See Comments)    Dizziness, woozy    Patient Measurements: Height: 6' (182.9 cm) Weight: 209 lb 12.8 oz (95.165 kg) IBW/kg (Calculated) : 77.6  Vital Signs: Temp: 98.3 F (36.8 C) (09/11 2108) Temp Source: Oral (09/11 2108) BP: 107/71 mmHg (09/11 2108) Pulse Rate: 65 (09/11 2108)  Labs:  Recent Labs  03/11/15 1238  03/11/15 1954  03/11/15 2211 03/12/15 0419 03/12/15 0420 03/12/15 1047 03/13/15 0248 03/13/15 0500  HGB 14.4  --   --   --   --   --  12.8*  --  12.0*  --   HCT 42.2  --   --   --   --   --  37.4*  --  35.1*  --   PLT 154  --   --   --   --   --  132*  --  122*  --   LABPROT  --   --   --   --   --   --   --   --  14.4  --   INR  --   --   --   --   --   --   --   --  1.10  --   HEPARINUNFRC  --   --   --   < > 0.14* 0.38  --  0.30  --  0.21*  CREATININE 0.94  --   --   --   --   --  0.83  --   --   --   TROPONINI  --   < > 12.74*  --  7.94*  --  7.47*  --   --   --   < > = values in this interval not displayed.  Estimated Creatinine Clearance: 117.5 mL/min (by C-G formula based on Cr of 0.83).  Assessment: 58yom continues on heparin for NSTEMI with plans for cath today - not on schedule yet. Heparin level subtherapeutic (0.21) on 1600 units/hr. No issues with line or bleeding reported per RN. Hgb and plt down a bit.  Goal of Therapy:  Heparin level 0.3-0.7 units/ml Monitor platelets by anticoagulation protocol: Yes   Plan:  1) Increase heparin to 1800 units/hr 2) F/u 6 hr heparin level or f/u post cath  Christoper Fabian, PharmD, BCPS Clinical pharmacist, pager [phone] 03/13/2015,4:47 AM

## 2015-03-13 NOTE — Progress Notes (Signed)
Patient Name: [name] Walker Date of Encounter: 03/13/2015  Active Problems:   Old MI (myocardial infarction)   Chronic systolic heart failure   Coronary atherosclerosis of native coronary artery   Kidney stone on right side   Chest pain   NSTEMI (non-ST elevated myocardial infarction)   Primary Cardiologist: Dr. Katrinka Blazing Patient Profile: 59 yo male w/ PMH of ischemic cardiomyopathy, chronic systolic HF (EF < 35%), HTN, OSA, and tobacco abuse admitted on 03/11/2015 for NSTEMI. LHC with Dr. Katrinka Blazing scheduled on 03/13/2015 at 1330.  SUBJECTIVE: Denies any chest pain, shortness of breath, or palpitations. Has been NPO since midnight due to scheduled cardiac catheterization.  OBJECTIVE Filed Vitals:   03/12/15 0300 03/12/15 1327 03/12/15 2108 03/13/15 0525  BP: 99/63 116/69 107/71 119/69  Pulse: 59 63 65 56  Temp: 98 F (36.7 C) 98.1 F (36.7 C) 98.3 F (36.8 C) 98.1 F (36.7 C)  TempSrc: Oral Oral Oral Oral  Resp:  Height:      Weight: 209 lb 12.8 oz (95.165 kg)   210 lb 1.6 oz (95.3 kg)  SpO2: 97% 95% 95% 99%    Intake/Output Summary (Last 24 hours) at 03/13/15 0755 Last data filed at 03/13/15 0730  Gross per 24 hour  Intake    840 ml  Output   1801 ml  Net   -961 ml   Filed Weights   03/11/15 1611 03/12/15 0300 03/13/15 0525  Weight: 210 lb 12.2 oz (95.6 kg) 209 lb 12.8 oz (95.165 kg) 210 lb 1.6 oz (95.3 kg)    PHYSICAL EXAM General: Well developed, well nourished, pleasant male in no acute distress. Head: Normocephalic, atraumatic.  Neck: Supple without bruits, JVD not elevated. Lungs:  Resp regular and unlabored, CTA without wheezing or rales. Heart: RRR, S1, S2, no S3, S4, or murmur; no rub. Abdomen: Soft, non-tender, non-distended with normoactive bowel sounds. No hepatomegaly. No rebound/guarding. No obvious abdominal masses. Extremities: No clubbing, cyanosis, or edema. Distal pedal pulses are 2+ bilaterally. Neuro: Alert and oriented X 3. Moves all  extremities spontaneously. Psych: Normal affect.   LABS: CBC: Recent Labs  03/12/15 0420 03/13/15 0248  WBC 8.1 5.9  HGB 12.8* 12.0*  HCT 37.4* 35.1*  MCV 95.4 94.9  PLT 132* 122*   INR: Recent Labs  03/13/15 0248  INR 1.10   Basic Metabolic Panel: Recent Labs  03/11/15 1238 03/12/15 0420  NA 138 140  K 4.2 3.8  CL 105 112*  CO2 25 24  GLUCOSE 129* 111*  BUN 14 8  CREATININE 0.94 0.83  CALCIUM 9.1 8.5*   Cardiac Enzymes: Recent Labs  03/11/15 1954 03/11/15 2211 03/12/15 0420  TROPONINI 12.74* 7.94* 7.47*    Recent Labs  03/11/15 1209  TROPIPOC 0.30*   Fasting Lipid Panel: Recent Labs  03/12/15 0420  CHOL 111  HDL 32*  LDLCALC 55  TRIG 161  CHOLHDL 3.5    TELE:  Sinus rhythm with rate in 50's - 60's. 10 beats of asymptomatic ventricular tachycardia at 1300 on 03/12/2015.      ECHO: Pending  Radiology/Studies: Dg Chest Port 1 View: 03/11/2015   CLINICAL DATA:  Midsternal chest pain  EXAM: PORTABLE CHEST - 1 VIEW  COMPARISON:  01/06/2015  FINDINGS: Lung hyperinflation suggesting COPD. Normal heart size and mediastinal contours. There is no edema, consolidation, effusion, or pneumothorax. Remote and healed right clavicle fracture.  IMPRESSION: No active disease.   Electronically Signed   By: Marja Kays  Watts M.D.   On: 03/11/2015 13:10     Current Medications:  . aspirin EC  81 mg Oral Daily  . atorvastatin  40 mg Oral q1800  . carvedilol  12.5 mg Oral BID WC  . docusate sodium  100 mg Oral BID  . irbesartan  75 mg Oral Daily  . sodium chloride  3 mL Intravenous Q12H  . tamsulosin  0.4 mg Oral QHS   . sodium chloride 125 mL/hr at 03/12/15 2020  . sodium chloride    . heparin 1,800 Units/hr (03/13/15 0452)    ASSESSMENT AND PLAN:  1. NSTEMI - history of CAD with BMS to LAD in 2013. Patent at last cath in 12/2014. - cyclic troponins have been 4.57, 12.74, 7.94, and 7.47 respectively. - Continue ASA, Heparin, Coreg, Statin, and ARB.  Plavix has been held due to recent ureteral stent placement. Can potentially be resumed following cath per Dr. Anne Fu once Heparin is discontinued. - The risks and benefits of cardiac catheterization have been discussed with the patient. LHC is planned for today at 1330 with Dr. Katrinka Blazing.  2. Ischemic Cardiomyopathy - continue Coreg and ARB. - planning to discuss ICD with Dr. Graciela Husbands in October 2016.  3. Chronic Systolic Heart Failure - Echo on 01/2014 showed EF of 25-30%. - New Echo is pending. - Does not appear volume overloaded on exam.  4. Kidney stone on right side - ureteral stent placed on 03/07/2015.  5. Dyslipidemia - HDL low at 32 on 03/12/2015. - continue statin.   Signed, Ellsworth Lennox , PA-C 7:55 AM 03/13/2015 Pager: [phone]

## 2015-03-13 NOTE — Interval H&P Note (Signed)
Cath Lab Visit (complete for each Cath Lab visit)  Clinical Evaluation Leading to the Procedure:   ACS: Yes.    Non-ACS:    Anginal Classification: CCS IV  Anti-ischemic medical therapy: Maximal Therapy (2 or more classes of medications)  Non-Invasive Test Results: No non-invasive testing performed  Prior CABG: No previous CABG      History and Physical Interval Note:  03/13/2015 12:51 PM  [name] Walker  has presented today for surgery, with the diagnosis of NSTEMI  The various methods of treatment have been discussed with the patient and family. After consideration of risks, benefits and other options for treatment, the patient has consented to  Procedure(s): Left Heart Cath and Coronary Angiography (N/A) as a surgical intervention .  The patient's history has been reviewed, patient examined, no change in status, stable for surgery.  I have reviewed the patient's chart and labs.  Questions were answered to the patient's satisfaction.     Lesleigh Noe

## 2015-03-14 ENCOUNTER — Encounter (HOSPITAL_COMMUNITY): Payer: Self-pay | Admitting: Student

## 2015-03-14 ENCOUNTER — Inpatient Hospital Stay (HOSPITAL_COMMUNITY): Payer: PRIVATE HEALTH INSURANCE

## 2015-03-14 DIAGNOSIS — I252 Old myocardial infarction: Secondary | ICD-10-CM

## 2015-03-14 DIAGNOSIS — R079 Chest pain, unspecified: Secondary | ICD-10-CM

## 2015-03-14 DIAGNOSIS — I25110 Atherosclerotic heart disease of native coronary artery with unstable angina pectoris: Secondary | ICD-10-CM

## 2015-03-14 DIAGNOSIS — I10 Essential (primary) hypertension: Secondary | ICD-10-CM

## 2015-03-14 LAB — CBC
HCT: 37.9 % — ABNORMAL LOW (ref 39.0–52.0)
Hemoglobin: 13 g/dL (ref 13.0–17.0)
MCH: 32.3 pg (ref 26.0–34.0)
MCHC: 34.3 g/dL (ref 30.0–36.0)
MCV: 94 fL (ref 78.0–100.0)
Platelets: 141 10*3/uL — ABNORMAL LOW (ref 150–400)
RBC: 4.03 MIL/uL — ABNORMAL LOW (ref 4.22–5.81)
RDW: 13 % (ref 11.5–15.5)
WBC: 5.6 10*3/uL (ref 4.0–10.5)

## 2015-03-14 MED ORDER — ASPIRIN 81 MG PO TBEC: 81.0000 mg | DELAYED_RELEASE_TABLET | Freq: Every day | ORAL | Status: DC

## 2015-03-14 MED ORDER — NITROGLYCERIN 0.4 MG SL SUBL: 0.4000 mg | SUBLINGUAL_TABLET | SUBLINGUAL | Status: DC | PRN

## 2015-03-14 MED ORDER — ATORVASTATIN CALCIUM 40 MG PO TABS: 40.0000 mg | ORAL_TABLET | Freq: Every day | ORAL | Status: DC

## 2015-03-14 NOTE — Plan of Care (Signed)
Problem: Phase III Progression Outcomes Goal: Hemodynamically stable Outcome: Progressing Pt has been stable with vital signs witin normal limits. Noted to be in sinus brady on telemetry-- Heart rate in the 50. Pt tolerating well without any symptoms. He is ambulating around bedroom without any complaints voiced.

## 2015-03-14 NOTE — Discharge Summary (Signed)
CARDIOLOGY DISCHARGE SUMMARY   Patient ID: [name] Walker MRN: [id number] DOB/AGE: March 21, 1956 59 y.o.  Admit date: 03/11/2015 Discharge date: 03/14/2015  PCP: [name] Reichmann, MD Primary Cardiologist: Dr. Katrinka Walker  Primary Discharge Diagnosis: NSTEMI (non-ST elevated myocardial infarction) Secondary Discharge Diagnosis: Old MI (myocardial infarction), Chronic systolic heart failure, Essential hypertension, Dyslipidemia, Coronary atherosclerosis of native coronary artery, Kidney stone on right side and Chest pain    Consults: None  Procedures: Transthoracic Echocardiogram, Left Heart Catheterization, Coronary Angiography   Hospital Course: [name] Walker is a 59 y.o. male with past medical history of ischemic cardiomyopathy (EF 20-25% by cath in 12/2014), CAD ( BMS to LAD, 2013 - patent in 12/2014) chronic systolic congestive heart failure, HTN, and OSA who presented to the ED on 03/11/2015 for chest discomfort, left arm discomfort, and nausea.  Of note, he recently had a nephrostomy tube placed by [name] Walker on 03/07/2015. He had been off Plavix prior to his surgery and had resumed ASA 81mg  daily following the procedure.  POC Troponin was 0.30 and he was started on IV Heparin. Troponin continued to increase to 4.57, 12.74, 7.94, and 7.47 respectively.  With rising troponin values, it was decided cardiac catheterization would be the best option for the patient. He was educated on the risks and benefits of the procedure and was scheduled for cardiac catheterization on 03/13/2015.  Cath showed the LAD stent to be patent but significant left ventricular systolic dysfunction with predominant anterior and apical severe hypokinesis/akinesis with an EF 25-30%, which was similar to catheterization in July 2016. Dr. Katrinka Walker recommended that the patient discontinue Plavix at this time and only continue with ASA due to hematuria. The patient experienced no complications during or following the  procedure.  During the morning of 03/14/2015, his vitals were stable and his right radial cath site was without tenderness, ecchymosis, or hematoma. An echocardiogram was obtained that showed moderately to severely reduced systolic function with an estimated ejection fraction was in the range of 30%to 35% and Grade 1 diastolic dysfunction.  Dr. Delton Walker recommended the patient receive a LifeVest due to persistent low EF, either before discharge or the next day in office. The patient preferred to be seen in office and [name] Walker was contacted. An order for the Life Vest and supporting documents were faxed in and the patient was instructed to come to [name] at 8:00AM on 03/15/2015 to be fitted for the device. He also has scheduled follow-up with Dr. Graciela Walker on 04/04/2015 to discuss an ICD.  The patient was last examined by Dr. Delton Walker and deemed stable for discharge.  Labs:   Lab Results  Component Value Date   WBC 5.6 03/14/2015   HGB 13.0 03/14/2015   HCT 37.9* 03/14/2015   MCV 94.0 03/14/2015   PLT 141* 03/14/2015     Recent Labs Lab 03/12/15 0420 03/13/15 1534  NA 140  --   K 3.8  --   CL 112*  --   CO2 24  --   BUN 8  --   CREATININE 0.83 0.76  CALCIUM 8.5*  --   GLUCOSE 111*  --     Recent Labs  03/11/15 1954 03/11/15 2211 03/12/15 0420  TROPONINI 12.74* 7.94* 7.47*   Lipid Panel     Component Value Date/Time   CHOL 111 03/12/2015 0420   TRIG 121 03/12/2015 0420   HDL 32* 03/12/2015 0420   CHOLHDL 3.5 03/12/2015 0420   VLDL 24 03/12/2015 0420   LDLCALC 55  03/12/2015 0420   Recent Labs  03/13/15 0248  INR 1.10      Radiology: Dg Chest Port 1 View: 03/11/2015   CLINICAL DATA:  Midsternal chest pain  EXAM: PORTABLE CHEST - 1 VIEW  COMPARISON:  01/06/2015  FINDINGS: Lung hyperinflation suggesting COPD. Normal heart size and mediastinal contours. There is no edema, consolidation, effusion, or pneumothorax. Remote and healed right clavicle fracture.   IMPRESSION: No active disease.   Electronically Signed   By: Marnee Spring M.D.   On: 03/11/2015 13:10    Cardiac Catheterization: 03/14/2015 The LAD stent is patent. The first diagonal that is partially jailed by the stent contains ostial 50% narrowing. The LAD distal to the stent contains eccentric 40% narrowing.  The circumflex is dominant and contains luminal irregularities but no significant obstruction.  The right coronary is nondominant. No significant obstruction is noted.  Significant left ventricular systolic dysfunction with predominant anterior and apical severe hypokinesis/akinesis. EF 25-30%.  When compared to the images obtained in June 2016, no significant change has occurred.  Recommendations:  With continued hematuria, I would recommend continued aspirin only therapy rather than dual antiplatelet therapy.  Discontinue IV heparin.  Observation overnight and if no further chest discomfort, he'll be safe to discharge in a.m.   Echo: 03/14/2015 Study Conclusions - Left ventricle: The cavity size was normal. Wall thickness was normal. There is anterior, anteroseptal, apical and inferoapical akinesis and increased echogenicity consistent with LAD territory ischemia/infarct. Systolic function was moderately to severely reduced. The estimated ejection fraction was in the range of 30% to 35%. Doppler parameters are consistent with abnormal left ventricular relaxation (grade 1 diastolic dysfunction). The E/e&' ratio is between 8-15, suggesting indeterminate LV filling pressure. - Mitral valve: Mildly thickened leaflets . There was mild regurgitation. - Left atrium: The atrium was normal in size. - Right atrium: The atrium was mildly dilated. - Tricuspid valve: There was trivial regurgitation. - Pulmonary arteries: PA peak pressure: 19 mm Hg (S). - Inferior vena cava: The vessel was normal in size. The respirophasic diameter changes were in the  normal range (>= 50%), consistent with normal central venous pressure.  Impressions: - Compared to a prior echo in 2015, there is a persistent LAD territory wall motion abnormality consistent with scar. The LVEF is slightly higher at 30-35%.   FOLLOW UP PLANS AND APPOINTMENTS Allergies  Allergen Reactions  . Nucynta [Tapentadol] Nausea Only and Other (Walker Comments)    Dizziness, woozy     Medication List    STOP taking these medications        clopidogrel 75 MG tablet  Commonly known as:  PLAVIX      TAKE these medications        albuterol 108 (90 BASE) MCG/ACT inhaler  Commonly known as:  PROVENTIL HFA;VENTOLIN HFA  Inhale 2 puffs into the lungs every 6 (six) hours as needed for wheezing or shortness of breath (chest tightness). ProAir     aspirin 81 MG EC tablet  Take 1 tablet (81 mg total) by mouth daily.     atorvastatin 40 MG tablet  Commonly known as:  LIPITOR  Take 1 tablet (40 mg total) by mouth daily at 6 PM.     carvedilol 12.5 MG tablet  Commonly known as:  COREG  Take 12.5 mg by mouth 2 (two) times daily with a meal.     docusate sodium 100 MG capsule  Commonly known as:  COLACE  Take 2 capsules (200 mg total) by  mouth 2 (two) times daily.     hyoscyamine 0.125 MG SL tablet  Commonly known as:  LEVSIN/SL  Place 2 tablets (0.25 mg total) under the tongue every 4 (four) hours as needed (bladder spasms). 1-2 TABS     levofloxacin 500 MG tablet  Commonly known as:  LEVAQUIN  Take 1 tablet (500 mg total) by mouth daily.     nitroGLYCERIN 0.4 MG SL tablet  Commonly known as:  NITROSTAT  Place 1 tablet (0.4 mg total) under the tongue every 5 (five) minutes x 3 doses as needed for chest pain.     NUCYNTA 50 MG Tabs tablet  Generic drug:  tapentadol  Take 1 tablet (50 mg total) by mouth every 6 (six) hours as needed. 1 TO 2 TABS Q 6 HOURS PRN PAIN     NUCYNTA 50 MG Tabs tablet  Generic drug:  tapentadol  Take 1 tablet (50 mg total) by mouth  every 6 (six) hours as needed for moderate pain. 1 TO 2 TABS Q 6 HOURS PRN PAIN     ondansetron 8 MG disintegrating tablet  Commonly known as:  ZOFRAN ODT  Take 1 tablet (8 mg total) by mouth every 6 (six) hours as needed for nausea or vomiting.     oxyCODONE-acetaminophen 10-325 MG per tablet  Commonly known as:  PERCOCET  Take 1-2 tablets by mouth every 4 (four) hours as needed for pain. Maximum dose per 24 hours - 8 pills     promethazine 25 MG suppository  Commonly known as:  PHENERGAN  Place 1 suppository (25 mg total) rectally every 6 (six) hours as needed for nausea or vomiting.     tamsulosin 0.4 MG Caps capsule  Commonly known as:  FLOMAX  Take 0.4 mg by mouth at bedtime.     valsartan 160 MG tablet  Commonly known as:  DIOVAN  Take 160 mg by mouth daily.         Follow-up Information    Follow up with [name] On 03/15/2015.   Why:  Appointment for Life Vest on 03/15/2015 at 8:00AM. ASK FOR KELLY LANIER.   Contact information:   [address] [place] [address] [phone]      Follow up with Sherryl Manges, MD On 04/04/2015.   Specialty:  Cardiology   Why:  Cardiology Appointment with Dr. Graciela Walker on 04/04/2015 ay 1:30 PM.   Contact information:   [address] [place] [address] [phone]       BRING ALL MEDICATIONS WITH YOU TO FOLLOW UP APPOINTMENTS  Time spent with patient to include physician time: 50 minutes Signed: Ellsworth Lennox, PA 03/14/2015, 2:55 PM Co-Sign MD

## 2015-03-14 NOTE — Progress Notes (Signed)
Patient Name: [name] Walker Date of Encounter: 03/14/2015  Active Problems:   Old MI (myocardial infarction)   Chronic systolic heart failure   Essential hypertension, benign   Dyslipidemia   Coronary atherosclerosis of native coronary artery   Kidney stone on right side   Chest pain   NSTEMI (non-ST elevated myocardial infarction)     Primary Cardiologist: Dr.Smith Patient Profile: 59 yo male w/ PMH of ischemic cardiomyopathy, chronic systolic HF (EF < 35%), HTN, OSA, and tobacco abuse admitted on 03/11/2015 for NSTEMI. LHC performed on 03/13/2015 showing patent LAD stent and no significant coronary changes.  SUBJECTIVE: Feeling well. Denies any chest pain or shortness of breath. Reports no complications from his procedure yesterday.  OBJECTIVE Filed Vitals:   03/13/15 1500 03/13/15 1515 03/13/15 2032 03/14/15 0617  BP: 135/81 132/80 104/64 114/74  Pulse: 56 58 51 52  Temp:   98 F (36.7 C) 98 F (36.7 C)  TempSrc:   Oral Oral  Resp:   18 18  Height:      Weight:    207 lb (93.895 kg)  SpO2:   95% 97%    Intake/Output Summary (Last 24 hours) at 03/14/15 0818 Last data filed at 03/13/15 1730  Gross per 24 hour  Intake    360 ml  Output      0 ml  Net    360 ml   Filed Weights   03/12/15 0300 03/13/15 0525 03/14/15 0617  Weight: 209 lb 12.8 oz (95.165 kg) 210 lb 1.6 oz (95.3 kg) 207 lb (93.895 kg)    PHYSICAL EXAM General: Well developed, well nourished, male in no acute distress. Head: Normocephalic, atraumatic.  Neck: Supple without bruits, JVD not elevated. Lungs:  Resp regular and unlabored, CTA without wheezing or rales. Heart: RRR, S1, S2, no S3, S4, or murmur; no rub. Abdomen: Soft, non-tender, non-distended with normoactive bowel sounds. No hepatomegaly. No rebound/guarding. No obvious abdominal masses. Extremities: No clubbing, cyanosis, or edema. Distal pedal pulses are 2+ bilaterally. Right radial cath site without tenderness, ecchymosis, or  hematoma. Neuro: Alert and oriented X 3. Moves all extremities spontaneously. Psych: Normal affect.   LABS: CBC: Recent Labs  03/13/15 1534 03/14/15 0436  WBC 6.3 5.6  HGB 12.9* 13.0  HCT 37.4* 37.9*  MCV 94.0 94.0  PLT 144* 141*   INR: Recent Labs  03/13/15 0248  INR 1.10   Basic Metabolic Panel: Recent Labs  03/11/15 1238 03/12/15 0420 03/13/15 1534  NA 138 140  --   K 4.2 3.8  --   CL 105 112*  --   CO2 25 24  --   GLUCOSE 129* 111*  --   BUN 14 8  --   CREATININE 0.94 0.83 0.76  CALCIUM 9.1 8.5*  --    Cardiac Enzymes: Recent Labs  03/11/15 1954 03/11/15 2211 03/12/15 0420  TROPONINI 12.74* 7.94* 7.47*    Recent Labs  03/11/15 1209  TROPIPOC 0.30*   Fasting Lipid Panel: Recent Labs  03/12/15 0420  CHOL 111  HDL 32*  LDLCALC 55  TRIG 161  CHOLHDL 3.5    TELE: Sinus rhythm with rate in 40's - 60's.       ECHO:Pending  Cardiac Catheterization: 03/14/2015 The LAD stent is patent. The first diagonal that is partially jailed by the stent contains ostial 50% narrowing. The LAD distal to the stent contains eccentric 40% narrowing.  The circumflex is dominant and contains luminal irregularities but no significant obstruction.  The right coronary is nondominant. No significant obstruction is noted.  Significant left ventricular systolic dysfunction with predominant anterior and apical severe hypokinesis/akinesis. EF 25-30%.  When compared to the images obtained in June 2016, no significant change has occurred.   Recommendations:  With continued hematuria, I would recommend continued aspirin only therapy rather than dual antiplatelet therapy.  Discontinue IV heparin.  Observation overnight and if no further chest discomfort, he'll be safe to discharge in a.m.  Current Medications:  . aspirin EC  81 mg Oral Daily  . atorvastatin  40 mg Oral q1800  . carvedilol  12.5 mg Oral BID WC  . docusate sodium  100 mg Oral BID  . heparin  5,000  Units Subcutaneous 3 times per day  . irbesartan  75 mg Oral Daily  . sodium chloride  3 mL Intravenous Q12H  . tamsulosin  0.4 mg Oral QHS      ASSESSMENT AND PLAN:  1. NSTEMI - history of CAD with BMS to LAD in 2013. Patent at last cath in 12/2014. - cyclic troponins have been 4.57, 12.74, 7.94, and 7.47 respectively. - LHC performed on 03/14/2015 showed patent LAD stent and no significant new findings since imaging in June 2016. No intervention was needed. - Continue ASA, Heparin, Coreg, Statin, and ARB. Will continue ASA only therapy instead of DAPT in setting of hematuria per Dr. Katrinka Blazing.  2. Ischemic Cardiomyopathy - EF determined by LHC on 03/14/2015 was 25-30%, consistent with previous Echo in 01/2014. - continue Coreg and ARB. - planning to discuss ICD with Dr. Graciela Husbands in October 2016.  3. Chronic Systolic Heart Failure - Echo on 01/2014 showed EF of 25-30%. - New Echo is pending. - Does not appear volume overloaded on exam.  4. Kidney stone on right side - ureteral stent placed on 03/07/2015.  5. Dyslipidemia - HDL low at 32 on 03/12/2015. - continue statin.  Appears stable for discharge today.  Signed, Ellsworth Lennox , PA-C 8:18 AM 03/14/2015 Pager: [phone]  59 year old male with ischemic cardiomyopathy ejection fraction of 20-25% with prior LAD stent, widely patent in July 2016 (Dr. Katrinka Blazing) during cardiac catheterization, 3 days postop urostomy stent placement by urology at [facility] regional off Plavix here with transient chest discomfort, now with elevated Troponin consistent with NSTEMI, cath showed patent stents. Echo in August 2016 shows persistent LVEF 25-30%, she is scheduled to see Dr Graciela Husbands for ICD consideration. A follow up echo is pending, if persistent LVEF < 35%, he will need to be discharged with a LifeVest.  Continue ASA, Heparin, Coreg, Statin, and ARB. Will continue ASA only therapy instead of DAPT in setting of hematuria per Dr. Katrinka Blazing.  Discharge  home today after echo results are available and potentially LifeVest arranged.   [name] Walker 03/14/2015

## 2015-03-14 NOTE — Discharge Instructions (Signed)

## 2015-03-14 NOTE — Progress Notes (Signed)
  Echocardiogram 2D Echocardiogram has been performed.  [name] 03/14/2015, 11:35 AM

## 2015-03-14 NOTE — Progress Notes (Signed)
Spoke with Dennis Bast and she said she could either come to the hospital today and fit the patient for a Life Vest this evening or he could go to the [employer]. Office tomorrow morning at 8:00AM to be fitted.  The patient requested to come to the office tomorrow for the appointment. Therefore, he will be discharged from the hospital.  Supporting documents and the order form have been faxed to [employer] at [phone].   Signed, Ellsworth Lennox, PA-C 03/14/2015, 2:04 PM Pager: [phone]

## 2015-03-16 ENCOUNTER — Telehealth: Payer: Self-pay | Admitting: Interventional Cardiology

## 2015-03-16 NOTE — Telephone Encounter (Signed)
New Message       Pt calling stating that when he was recently in the hospital he was started back on his Aspirin and his kidney doctor is wanting him to stop taking it. Please call back and advise.

## 2015-03-17 NOTE — Telephone Encounter (Signed)
Returned pt call. Adv pt per Dr.Smith from a cardiac standpoint  Aspirin should NOT be stopped. Pt should continue Asa  daily. Pt verbalized understanding.

## 2015-03-20 ENCOUNTER — Telehealth: Payer: Self-pay | Admitting: *Deleted

## 2015-03-20 NOTE — Telephone Encounter (Signed)
Have left several messages for patient since last month.     Lmtcb on 8/16, 9/06, & 9/16.  Have been attempting to reach patient to arrange SICD screening. Patient recently admitted for CP and has appt on 10/4 to address LVD again.  Will keep attempting to reach patient to go over upcoming appt and screening for SICD.  I will also review with Dr Odessa Fleming nurse, Herbert Seta, as to update her why patient has not been scheduled for screening/procedure since OV in July.

## 2015-03-21 ENCOUNTER — Encounter: Payer: Self-pay | Admitting: *Deleted

## 2015-03-21 NOTE — Telephone Encounter (Signed)
This encounter was created in error - please disregard.

## 2015-03-21 NOTE — Telephone Encounter (Addendum)
Patient returns my call!  Explained that we had attempted to reach him since last month to arrange SICD screening. Informed patient that I would arrange for him to be screened at his OV on 10/4. He is agreeable to plan. Barbara Cower, [employer] rep, notified of appt day/time.

## 2015-03-22 ENCOUNTER — Telehealth: Payer: Self-pay | Admitting: Cardiology

## 2015-03-22 NOTE — Telephone Encounter (Signed)
Please let patient know that he has very mild OSA with an AHI of 7/hr.  Please set up OV at next available to discuss resutls.

## 2015-03-23 NOTE — Telephone Encounter (Signed)
Patient is aware of results. He is scheduled to come in to discuss treatment on 04/20/15 3PM

## 2015-03-23 NOTE — Telephone Encounter (Signed)
Left message for patient to call.

## 2015-04-02 ENCOUNTER — Other Ambulatory Visit: Payer: Self-pay | Admitting: Interventional Cardiology

## 2015-04-03 ENCOUNTER — Other Ambulatory Visit: Payer: Self-pay

## 2015-04-04 ENCOUNTER — Encounter: Payer: Self-pay | Admitting: Internal Medicine

## 2015-04-04 ENCOUNTER — Ambulatory Visit (INDEPENDENT_AMBULATORY_CARE_PROVIDER_SITE_OTHER): Payer: PRIVATE HEALTH INSURANCE | Admitting: Internal Medicine

## 2015-04-04 VITALS — BP 102/66 | HR 63 | Ht 72.0 in | Wt 210.0 lb

## 2015-04-04 DIAGNOSIS — I255 Ischemic cardiomyopathy: Secondary | ICD-10-CM | POA: Diagnosis not present

## 2015-04-04 DIAGNOSIS — I420 Dilated cardiomyopathy: Principal | ICD-10-CM

## 2015-04-04 DIAGNOSIS — I214 Non-ST elevation (NSTEMI) myocardial infarction: Secondary | ICD-10-CM

## 2015-04-04 MED ORDER — SACUBITRIL-VALSARTAN 24-26 MG PO TABS: 1.0000 | ORAL_TABLET | Freq: Two times a day (BID) | ORAL | Status: DC

## 2015-04-04 NOTE — Patient Instructions (Signed)
Medication Instructions: 1) Start Entresto 24/26 mg one tablet by mouth twice daily (start on Thursday) 2) Stop Diovan (Valsartan)  Labwork: - none  Procedures/Testing: - Your physician has requested that you have an echocardiogram- in 4 weeks. Echocardiography is a painless test that uses sound waves to create images of your heart. It provides your doctor with information about the size and shape of your heart and how well your heart's chambers and valves are working. This procedure takes approximately one hour. There are no restrictions for this procedure.  - Your physician has recommended that you have a Subcutaneous defibrillator inserted. An implantable cardioverter defibrillator (ICD) is a small device that is placed in your chest or, in rare cases, your abdomen. This device uses electrical pulses or shocks to help control life-threatening, irregular heartbeats that could lead the heart to suddenly stop beating (sudden cardiac arrest). Leads are attached to the ICD that goes into your heart. This is done in the hospital and usually requires an overnight stay. - we will plan to set this up for November after you meet with the Urologist and possibly have your stent removed. Please call us after your appointment with Urology.  Follow-Up: - to be determined post procedure  Any Additional Special Instructions Will Be Listed Below (If Applicable). - none

## 2015-04-04 NOTE — Addendum Note (Signed)
Addended by: Sherri Rad C on: 04/04/2015 03:38 PM   Modules accepted: Orders

## 2015-04-04 NOTE — Progress Notes (Signed)
Patient Care Team: Barbette Reichmann, MD as PCP - General (Internal Medicine)   HPI  [name] Walker is a 59 y.o. male Seen for reconsideration of an ICD.  He was seen in July. He has a history of coronary artery disease with an MI 2013 and stenting of his LAD. Catheterization 7/16 demonstrated anterior wall akinesis with an ejection fraction of 20-25% and patent coronaries. At that time ICD was recommended but not consummated because of issues with hematuria 9/16 he presented to the hospital with chest pain and had a non-STEMI with peak troponin of 12.5. He underwent repeat catheterization demonstrating no significant changes in his coronary anatomy; the explanation for his non-STEMI was not hypothesized in the records that I reviewed  There was a problem with hematuria requiring nephrostomy tube. His Plavix was discontinued and aspirin was maintained.  Lithotripsy has been successful in getting rid of the stone. A stent is in place but the nephrostomy has been  removed. The stent is to be removed potentially early November.  He has no chest pain. He is having mild shortness of breath but this is stable. He is having no peripheral edema  He is smoking but is down to 4 cigarettes a day.      Past Medical History  Diagnosis Date  . Varicose veins   . Hypertension   . Chronic kidney disease     stone  . Sleep apnea   . Sleep apnea     to have sleep study  . Coronary artery disease 2013    BMS to LAD  . MI (myocardial infarction) (HCC) 06/19/2012    being considered for defibilator  . NSTEMI (non-ST elevated myocardial infarction) (HCC) 03/2015    Patent BMS - LAD stent.  . Ischemic cardiomyopathy     EF 25-30%     Past Surgical History  Procedure Laterality Date  . Coronary stent placement    . Left heart catheterization with coronary angiogram Right 06/19/2012    Procedure: LEFT HEART CATHETERIZATION WITH CORONARY ANGIOGRAM;  Surgeon: Lesleigh Noe, MD;   Location: [facility] CATH LAB;  Service: Cardiovascular;  Laterality: Right;  . Percutaneous coronary stent intervention (pci-s) Right 06/19/2012    Procedure: PERCUTANEOUS CORONARY STENT INTERVENTION (PCI-S);  Surgeon: Lesleigh Noe, MD;  Location: [facility] CATH LAB;  Service: Cardiovascular;  Laterality: Right;  . Cardiac catheterization N/A 01/09/2015    Procedure: Left Heart Cath and Coronary Angiography;  Surgeon: Lyn Records, MD;  Location: [facility] INVASIVE CV LAB;  Service: Cardiovascular;  Laterality: N/A;  . Cystoscopy with stent placement Right 03/07/2015    Procedure: CYSTOSCOPY WITH STENT PLACEMENT;  Surgeon: Orson Ape, MD;  Location: [facility] ORS;  Service: Urology;  Laterality: Right;  . Extracorporeal shock wave lithotripsy Right 03/09/2015    Procedure: EXTRACORPOREAL SHOCK WAVE LITHOTRIPSY (ESWL);  Surgeon: Orson Ape, MD;  Location: [facility] ORS;  Service: Urology;  Laterality: Right;  . Cardiac catheterization N/A 03/13/2015    Procedure: Left Heart Cath and Coronary Angiography;  Surgeon: Lyn Records, MD;  Location: [facility] INVASIVE CV LAB;  Service: Cardiovascular;  Laterality: N/A;    Current Outpatient Prescriptions  Medication Sig Dispense Refill  . albuterol (PROVENTIL HFA;VENTOLIN HFA) 108 (90 BASE) MCG/ACT inhaler Inhale 2 puffs into the lungs every 6 (six) hours as needed for wheezing or shortness of breath (chest tightness). ProAir    . aspirin EC 81 MG EC tablet Take 1 tablet (81 mg total)  by mouth daily.    Marland Kitchen atorvastatin (LIPITOR) 40 MG tablet Take 1 tablet (40 mg total) by mouth daily at 6 PM. 30 tablet 6  . carvedilol (COREG) 12.5 MG tablet Take 12.5 mg by mouth 2 (two) times daily with a meal.    . hyoscyamine (LEVSIN/SL) 0.125 MG SL tablet Place 2 tablets (0.25 mg total) under the tongue every 4 (four) hours as needed (bladder spasms). 1-2 TABS (Patient taking differently: Place 0.125 mg under the tongue every 4 (four) hours as needed (bladder spasms). ) 40 tablet 3  .  nitroGLYCERIN (NITROSTAT) 0.4 MG SL tablet Place 1 tablet (0.4 mg total) under the tongue every 5 (five) minutes x 3 doses as needed for chest pain. 25 tablet 0  . oxyCODONE-acetaminophen (PERCOCET) 10-325 MG per tablet Take 1-2 tablets by mouth every 4 (four) hours as needed for pain. Maximum dose per 24 hours - 8 pills 30 tablet 0  . tamsulosin (FLOMAX) 0.4 MG CAPS capsule Take 0.4 mg by mouth at bedtime.   3  . valsartan (DIOVAN) 160 MG tablet Take 160 mg by mouth daily.     No current facility-administered medications for this visit.    Allergies  Allergen Reactions  . Nucynta [Tapentadol] Nausea Only and Other (See Comments)    Dizziness, woozy      Review of Systems negative except from HPI and PMH  Physical Exam BP 102/66 mmHg  Pulse 63  Ht 6' (1.829 m)  Wt 210 lb (95.255 kg)  BMI 28.47 kg/m2 Well developed and well nourished in no acute distress HENT normal E scleral and icterus clear Neck Supple JVP flat; carotids brisk and full Clear to ausculation  Regular rate and rhythm, no murmurs gallops or rub Soft with active bowel sounds No clubbing cyanosis  Edema Alert and oriented, grossly normal motor and sensory function Skin Warm and Dry  ECG demonstrates sinus rhythm at 63 Interval 17/08/39  Assessment and  Plan  Ischemic cardiomyopathy  Congestive heart.-Class II  Nephrolithiasis with residual "gravel" and stent in place  We will plan to change his valsartan--Entresto.  We will plan then to reevaluate his LV function in about 4 weeks.  He would like to be mapped for consideration for S ICD.  The issue of timiig ewill need to be deferred until the stent is removed.

## 2015-04-04 NOTE — Addendum Note (Signed)
Addended by: Sherri Rad C on: 04/04/2015 03:18 PM   Modules accepted: Orders, Medications

## 2015-04-19 DIAGNOSIS — G4733 Obstructive sleep apnea (adult) (pediatric): Secondary | ICD-10-CM | POA: Insufficient documentation

## 2015-04-19 NOTE — Progress Notes (Signed)
Cardiology Office Note   Date:  04/20/2015   ID:  [name] Walker, DOB September 28, 1955, MRN [id number]  PCP:  Barbette ReichmannHANDE,VISHWANATH, MD    Chief Complaint  Patient presents with  . Sleep Apnea      History of Present Illness: [name] Walker is a 59 y.o. male who presents for evaluation of OSA.  Dr. Graciela HusbandsKlein ordered a home sleep study due to He underwent home sleep study by Dr. Graciela HusbandsKlein which showed very mild OSA with an AHI of 7/hr.  He is now here to discuss treatment options.  He goes to bed at 8am and gets up at 3:40am so he gets close to 8 hours of sleep.  He feels rested in the am for the most part if he gets 8 hours of sleep.  Around 2pm he will feel tired but does not nap.  He may fall asleep late in the evening when he sits on the couch.  He does snore and wakes himself up snoring.  He does not wake up gasping for air.  His epworth sleepiness score was low at 2.  He sleeps mainly on his side.  He does not drink ETOH.      Past Medical History  Diagnosis Date  . Varicose veins   . Hypertension   . Chronic kidney disease     stone  . Sleep apnea   . Sleep apnea     to have sleep study  . Coronary artery disease 2013    BMS to LAD  . MI (myocardial infarction) (HCC) 06/19/2012    being considered for defibilator  . NSTEMI (non-ST elevated myocardial infarction) (HCC) 03/2015    Patent BMS - LAD stent.  . Ischemic cardiomyopathy     EF 25-30%     Past Surgical History  Procedure Laterality Date  . Coronary stent placement    . Left heart catheterization with coronary angiogram Right 06/19/2012    Procedure: LEFT HEART CATHETERIZATION WITH CORONARY ANGIOGRAM;  Surgeon: Lesleigh NoeHenry W Smith III, MD;  [facility];  Service: Cardiovascular;  Laterality: Right;  . Percutaneous coronary stent intervention (pci-s) Right 06/19/2012    Procedure: PERCUTANEOUS CORONARY STENT INTERVENTION (PCI-S);  Surgeon: Lesleigh NoeHenry W Smith III, MD;  [facility];  Service:  Cardiovascular;  Laterality: Right;  . Cardiac catheterization N/A 01/09/2015    Procedure: Left Heart Cath and Coronary Angiography;  Surgeon: Lyn RecordsHenry W Smith, MD;  [facility];  Service: Cardiovascular;  Laterality: N/A;  . Cystoscopy with stent placement Right 03/07/2015    Procedure: CYSTOSCOPY WITH STENT PLACEMENT;  Surgeon: Orson ApeMichael R Wolff, MD;  Location: [facility] ORS;  Service: Urology;  Laterality: Right;  . Extracorporeal shock wave lithotripsy Right 03/09/2015    Procedure: EXTRACORPOREAL SHOCK WAVE LITHOTRIPSY (ESWL);  Surgeon: Orson ApeMichael R Wolff, MD;  Location: [facility] ORS;  Service: Urology;  Laterality: Right;  . Cardiac catheterization N/A 03/13/2015    Procedure: Left Heart Cath and Coronary Angiography;  Surgeon: Lyn RecordsHenry W Smith, MD;  [facility];  Service: Cardiovascular;  Laterality: N/A;     Current Outpatient Prescriptions  Medication Sig Dispense Refill  . albuterol (PROVENTIL HFA;VENTOLIN HFA) 108 (90 BASE) MCG/ACT inhaler Inhale 2 puffs into the lungs every 6 (six) hours as needed for wheezing or shortness of breath (chest tightness). ProAir    . aspirin EC 81 MG EC tablet Take 1  tablet (81 mg total) by mouth daily.    Marland Kitchen atorvastatin (LIPITOR) 40 MG tablet Take 1 tablet (40 mg total) by mouth daily at 6 PM. 30 tablet 6  . carvedilol (COREG) 12.5 MG tablet Take 12.5 mg by mouth 2 (two) times daily with a meal.    . hyoscyamine (LEVSIN/SL) 0.125 MG SL tablet Place 2 tablets (0.25 mg total) under the tongue every 4 (four) hours as needed (bladder spasms). 1-2 TABS (Patient taking differently: Place 0.125 mg under the tongue every 4 (four) hours as needed (bladder spasms). ) 40 tablet 3  . nitroGLYCERIN (NITROSTAT) 0.4 MG SL tablet Place 1 tablet (0.4 mg total) under the tongue every 5 (five) minutes x 3 doses as needed for chest pain. 25 tablet 0  . oxyCODONE-acetaminophen (PERCOCET) 10-325 MG per tablet Take 1-2 tablets by mouth every 4 (four) hours as needed  for pain. Maximum dose per 24 hours - 8 pills 30 tablet 0  . sacubitril-valsartan (ENTRESTO) 24-26 MG Take 1 tablet by mouth 2 (two) times daily. 60 tablet 6  . tamsulosin (FLOMAX) 0.4 MG CAPS capsule Take 0.4 mg by mouth at bedtime.   3   No current facility-administered medications for this visit.    Allergies:   Nucynta    Social History:  The patient  reports that he has been smoking Cigarettes.  He has a 5 pack-year smoking history. He has never used smokeless tobacco. He reports that he drinks alcohol. He reports that he does not use illicit drugs.   Family History:  The patient's family history includes Cancer in his father; Heart attack in his brother; Heart murmur in his mother.    ROS:  Please see the history of present illness.   Otherwise, review of systems are positive for none.   All other systems are reviewed and negative.    PHYSICAL EXAM: VS:  BP 94/66 mmHg  Pulse 71  Ht 6' (1.829 m)  Wt 205 lb 12.8 oz (93.35 kg)  BMI 27.91 kg/m2  SpO2 93% , BMI Body mass index is 27.91 kg/(m^2). GEN: Well nourished, well developed, in no acute distress HEENT: normal Neck: no JVD, carotid bruits, or masses Cardiac: RRR; no murmurs, rubs, or gallops,no edema  Respiratory:  clear to auscultation bilaterally, normal work of breathing GI: soft, nontender, nondistended, + BS MS: no deformity or atrophy Skin: warm and dry, no rash Neuro:  Strength and sensation are intact Psych: euthymic mood, full affect   EKG:  EKG is not ordered today.    Recent Labs: 03/12/2015: BUN 8; Potassium 3.8; Sodium 140 03/13/2015: Creatinine, Ser 0.76 03/14/2015: Hemoglobin 13.0; Platelets 141*    Lipid Panel    Component Value Date/Time   CHOL 111 03/12/2015 0420   TRIG 121 03/12/2015 0420   HDL 32* 03/12/2015 0420   CHOLHDL 3.5 03/12/2015 0420   VLDL 24 03/12/2015 0420   LDLCALC 55 03/12/2015 0420      Wt Readings from Last 3 Encounters:  04/20/15 205 lb 12.8 oz (93.35 kg)  04/04/15  210 lb (95.255 kg)  03/14/15 207 lb (93.895 kg)       ASSESSMENT AND PLAN:  1.  Mild OSA with an AHI of 7/hr.  I have also instructed the patient on proper sleep hygiene, avoidance of sleeping in the supine position The patient was also instructed to avoid driving if sleepy.  He has no significant daytime sleepiness and his Epworth sleepiness score is low.  I do not think he  will benefit from CPAP.  I will refer him to dentistry for an evaluation for oral device.   2.  HTN - controlled    Current medicines are reviewed at length with the patient today.  The patient does not have concerns regarding medicines.  The following changes have been made:  no change  Labs/ tests ordered today: See above Assessment and Plan No orders of the defined types were placed in this encounter.     Disposition:   FU with me in 6 months Signed, Quintella Reichert, MD  04/20/2015 3:15 PM    [facility] [address] [place], [place], [place]  [address] Phone: [phone]; Fax: [phone]

## 2015-04-20 ENCOUNTER — Encounter: Payer: Self-pay | Admitting: Internal Medicine

## 2015-04-20 ENCOUNTER — Encounter: Payer: Self-pay | Admitting: Cardiology

## 2015-04-20 ENCOUNTER — Ambulatory Visit (INDEPENDENT_AMBULATORY_CARE_PROVIDER_SITE_OTHER): Payer: PRIVATE HEALTH INSURANCE | Admitting: Cardiology

## 2015-04-20 VITALS — BP 94/66 | HR 71 | Ht 72.0 in | Wt 205.8 lb

## 2015-04-20 DIAGNOSIS — I10 Essential (primary) hypertension: Secondary | ICD-10-CM | POA: Diagnosis not present

## 2015-04-20 DIAGNOSIS — G4733 Obstructive sleep apnea (adult) (pediatric): Secondary | ICD-10-CM

## 2015-04-20 NOTE — Patient Instructions (Signed)
Medication Instructions:  Your physician recommends that you continue on your current medications as directed. Please refer to the Current Medication list given to you today.   Labwork: None  Testing/Procedures: None  Follow-Up: You have been referred to Dr. Toni ArthursFuller for evaluation of an oral device for OSA.  Your physician wants you to follow-up in: 6 months with Dr. Mayford Knifeurner. You will receive a reminder letter in the mail two months in advance. If you don't receive a letter, please call our office to schedule the follow-up appointment.   Any Other Special Instructions Will Be Listed Below (If Applicable).

## 2015-04-25 ENCOUNTER — Other Ambulatory Visit: Payer: Self-pay | Admitting: Interventional Cardiology

## 2015-04-29 ENCOUNTER — Other Ambulatory Visit: Payer: Self-pay | Admitting: Interventional Cardiology

## 2015-05-03 ENCOUNTER — Telehealth: Payer: Self-pay | Admitting: Cardiology

## 2015-05-03 NOTE — Telephone Encounter (Signed)
New message    Referral was sent on yesterday - oral sleep compliance - office called this am to speak with patient - patient changed his mind does not want to come.

## 2015-05-03 NOTE — Telephone Encounter (Signed)
Confirmed with patient he has refused referral to Dr. Toni ArthursFuller.  He st his OSA is not bad enough for treatment.

## 2015-05-03 NOTE — Telephone Encounter (Signed)
No need for follow up

## 2015-05-04 NOTE — Telephone Encounter (Signed)
Patient is aware that he does not need to continue to follow-up with Dr. Mayford Knifeurner.  Instructed him to call us if he changed his mind on anything and we would help him as much as we could.   He appreciated the call.

## 2015-05-05 ENCOUNTER — Telehealth: Payer: Self-pay

## 2015-05-05 NOTE — Telephone Encounter (Signed)
Prior auth for App[employer]resto 24-26 sent to [facility].

## 2015-05-09 ENCOUNTER — Other Ambulatory Visit: Payer: Self-pay | Admitting: Internal Medicine

## 2015-05-09 ENCOUNTER — Other Ambulatory Visit: Payer: Self-pay

## 2015-05-09 ENCOUNTER — Ambulatory Visit (HOSPITAL_COMMUNITY): Payer: 59 | Attending: Cardiovascular Disease

## 2015-05-09 DIAGNOSIS — F17200 Nicotine dependence, unspecified, uncomplicated: Secondary | ICD-10-CM | POA: Insufficient documentation

## 2015-05-09 DIAGNOSIS — I255 Ischemic cardiomyopathy: Secondary | ICD-10-CM

## 2015-05-09 DIAGNOSIS — R29898 Other symptoms and signs involving the musculoskeletal system: Secondary | ICD-10-CM | POA: Insufficient documentation

## 2015-05-09 DIAGNOSIS — I059 Rheumatic mitral valve disease, unspecified: Secondary | ICD-10-CM | POA: Diagnosis not present

## 2015-05-09 DIAGNOSIS — E785 Hyperlipidemia, unspecified: Secondary | ICD-10-CM | POA: Diagnosis not present

## 2015-05-09 DIAGNOSIS — I10 Essential (primary) hypertension: Secondary | ICD-10-CM | POA: Insufficient documentation

## 2015-05-09 DIAGNOSIS — I77810 Thoracic aortic ectasia: Secondary | ICD-10-CM | POA: Diagnosis not present

## 2015-05-09 DIAGNOSIS — I420 Dilated cardiomyopathy: Principal | ICD-10-CM

## 2015-05-09 DIAGNOSIS — I517 Cardiomegaly: Secondary | ICD-10-CM | POA: Diagnosis not present

## 2015-05-10 ENCOUNTER — Telehealth: Payer: Self-pay | Admitting: Internal Medicine

## 2015-05-10 ENCOUNTER — Other Ambulatory Visit: Payer: Self-pay

## 2015-05-10 NOTE — Telephone Encounter (Signed)
Walk in pt form-pt needs autho for meds-gave to refill dept.

## 2015-05-11 ENCOUNTER — Ambulatory Visit
Admission: RE | Admit: 2015-05-11 | Discharge: 2015-05-11 | Disposition: A | Discharge status: Home / Self Care | Payer: Managed Care, Other (non HMO) | Source: Ambulatory Visit | Attending: Urology | Admitting: Urology

## 2015-05-11 ENCOUNTER — Encounter
Admission: RE | Disposition: A | Discharge status: Home / Self Care | Payer: Self-pay | Source: Ambulatory Visit | Attending: Urology

## 2015-05-11 ENCOUNTER — Encounter: Payer: Self-pay | Admitting: *Deleted

## 2015-05-11 DIAGNOSIS — I2510 Atherosclerotic heart disease of native coronary artery without angina pectoris: Secondary | ICD-10-CM | POA: Insufficient documentation

## 2015-05-11 DIAGNOSIS — Z79899 Other long term (current) drug therapy: Secondary | ICD-10-CM | POA: Diagnosis not present

## 2015-05-11 DIAGNOSIS — N200 Calculus of kidney: Secondary | ICD-10-CM | POA: Diagnosis not present

## 2015-05-11 DIAGNOSIS — F17200 Nicotine dependence, unspecified, uncomplicated: Secondary | ICD-10-CM | POA: Diagnosis not present

## 2015-05-11 DIAGNOSIS — E7800 Pure hypercholesterolemia, unspecified: Secondary | ICD-10-CM | POA: Diagnosis not present

## 2015-05-11 DIAGNOSIS — I252 Old myocardial infarction: Secondary | ICD-10-CM | POA: Diagnosis not present

## 2015-05-11 DIAGNOSIS — I10 Essential (primary) hypertension: Secondary | ICD-10-CM | POA: Diagnosis not present

## 2015-05-11 DIAGNOSIS — Z7951 Long term (current) use of inhaled steroids: Secondary | ICD-10-CM | POA: Diagnosis not present

## 2015-05-11 HISTORY — PX: EXTRACORPOREAL SHOCK WAVE LITHOTRIPSY: SHX1557

## 2015-05-11 SURGERY — LITHOTRIPSY, ESWL
Anesthesia: Moderate Sedation | Laterality: Right

## 2015-05-11 MED ORDER — PROMETHAZINE HCL 25 MG/ML IJ SOLN
25.0000 mg | Freq: Once | INTRAMUSCULAR | Status: AC
  Administered 2015-05-11: 25 mg via INTRAMUSCULAR

## 2015-05-11 MED ORDER — NUCYNTA 50 MG PO TABS: 50.0000 mg | ORAL_TABLET | Freq: Four times a day (QID) | ORAL | Status: DC | PRN

## 2015-05-11 MED ORDER — MORPHINE SULFATE (PF) 10 MG/ML IV SOLN
INTRAVENOUS | Status: AC
  Administered 2015-05-11: 10 mg via INTRAMUSCULAR
  Filled 2015-05-11: qty 1

## 2015-05-11 MED ORDER — PROMETHAZINE HCL 25 MG/ML IJ SOLN
INTRAMUSCULAR | Status: AC
  Administered 2015-05-11: 25 mg via INTRAMUSCULAR
  Filled 2015-05-11: qty 1

## 2015-05-11 MED ORDER — HYOSCYAMINE SULFATE 0.125 MG SL SUBL: 0.1250 mg | SUBLINGUAL_TABLET | SUBLINGUAL | Status: DC | PRN

## 2015-05-11 MED ORDER — MIDAZOLAM HCL 2 MG/2ML IJ SOLN
INTRAMUSCULAR | Status: AC
  Administered 2015-05-11: 1 mg via INTRAMUSCULAR
  Filled 2015-05-11: qty 2

## 2015-05-11 MED ORDER — ONDANSETRON 8 MG PO TBDP: 8.0000 mg | ORAL_TABLET | Freq: Four times a day (QID) | ORAL | Status: DC | PRN

## 2015-05-11 MED ORDER — MIDAZOLAM HCL 2 MG/2ML IJ SOLN
1.0000 mg | Freq: Once | INTRAMUSCULAR | Status: AC
  Administered 2015-05-11: 1 mg via INTRAMUSCULAR

## 2015-05-11 MED ORDER — LEVOFLOXACIN 500 MG PO TABS: 500.0000 mg | ORAL_TABLET | Freq: Every day | ORAL | Status: DC

## 2015-05-11 MED ORDER — DIPHENHYDRAMINE HCL 25 MG PO CAPS
25.0000 mg | ORAL_CAPSULE | ORAL | Status: AC
  Administered 2015-05-11: 25 mg via ORAL

## 2015-05-11 MED ORDER — DEXTROSE-NACL 5-0.45 % IV SOLN
INTRAVENOUS | Status: DC
  Administered 2015-05-11: 12:00:00 via INTRAVENOUS

## 2015-05-11 MED ORDER — FUROSEMIDE 10 MG/ML IJ SOLN: 10.0000 mg | Freq: Once | INTRAMUSCULAR | Status: DC

## 2015-05-11 MED ORDER — TAMSULOSIN HCL 0.4 MG PO CAPS: 0.4000 mg | ORAL_CAPSULE | Freq: Every day | ORAL | Status: DC

## 2015-05-11 MED ORDER — MORPHINE SULFATE (PF) 10 MG/ML IV SOLN
10.0000 mg | Freq: Once | INTRAVENOUS | Status: AC
  Administered 2015-05-11: 10 mg via INTRAMUSCULAR
  Filled 2015-05-11: qty 5

## 2015-05-11 MED ORDER — DIPHENHYDRAMINE HCL 25 MG PO CAPS
ORAL_CAPSULE | ORAL | Status: AC
  Administered 2015-05-11: 25 mg via ORAL
  Filled 2015-05-11: qty 1

## 2015-05-11 MED ORDER — LEVOFLOXACIN 500 MG PO TABS
500.0000 mg | ORAL_TABLET | Freq: Once | ORAL | Status: AC
  Administered 2015-05-11: 500 mg via ORAL

## 2015-05-11 MED ORDER — LEVOFLOXACIN 500 MG PO TABS
ORAL_TABLET | ORAL | Status: AC
  Administered 2015-05-11: 500 mg via ORAL
  Filled 2015-05-11: qty 1

## 2015-05-11 MED ORDER — DOCUSATE SODIUM 100 MG PO CAPS: 200.0000 mg | ORAL_CAPSULE | Freq: Two times a day (BID) | ORAL | Status: DC

## 2015-05-11 NOTE — Discharge Instructions (Addendum)
Dietary Guidelines to Help Prevent Kidney Stones °Your risk of kidney stones can be decreased by adjusting the foods you eat. The most important thing you can do is drink enough fluid. You should drink enough fluid to keep your urine clear or pale yellow. The following guidelines provide specific information for the type of kidney stone you have had. °GUIDELINES ACCORDING TO TYPE OF KIDNEY STONE °Calcium Oxalate Kidney Stones °· Reduce the amount of salt you eat. Foods that have a lot of salt cause your body to release excess calcium into your urine. The excess calcium can combine with a substance called oxalate to form kidney stones. °· Reduce the amount of animal protein you eat if the amount you eat is excessive. Animal protein causes your body to release excess calcium into your urine. Ask your dietitian how much protein from animal sources you should be eating. °· Avoid foods that are high in oxalates. If you take vitamins, they should have less than 500 mg of vitamin C. Your body turns vitamin C into oxalates. You do not need to avoid fruits and vegetables high in vitamin C. °Calcium Phosphate Kidney Stones °· Reduce the amount of salt you eat to help prevent the release of excess calcium into your urine. °· Reduce the amount of animal protein you eat if the amount you eat is excessive. Animal protein causes your body to release excess calcium into your urine. Ask your dietitian how much protein from animal sources you should be eating. °· Get enough calcium from food or take a calcium supplement (ask your dietitian for recommendations). Food sources of calcium that do not increase your risk of kidney stones include: °¨ Broccoli. °¨ Dairy products, such as cheese and yogurt. °¨ Pudding. °Uric Acid Kidney Stones °· Do not have more than 6 oz of animal protein per day. °FOOD SOURCES °Animal Protein Sources °· Meat (all types). °· Poultry. °· Eggs. °· Fish, seafood. °Foods High in Salt °· Salt seasonings. °· Soy  sauce. °· Teriyaki sauce. °· Cured and processed meats. °· Salted crackers and snack foods. °· Fast food. °· Canned soups and most canned foods. °Foods High in Oxalates °· Grains: °¨ Amaranth. °¨ Barley. °¨ Grits. °¨ Wheat germ. °¨ Bran. °¨ Buckwheat flour. °¨ All bran cereals. °¨ Pretzels. °¨ Whole wheat bread. °· Vegetables: °¨ Beans (wax). °¨ Beets and beet greens. °¨ Collard greens. °¨ Eggplant. °¨ Escarole. °¨ Leeks. °¨ Okra. °¨ Parsley. °¨ Rutabagas. °¨ Spinach. °¨ Swiss chard. °¨ Tomato paste. °¨ Fried potatoes. °¨ Sweet potatoes. °· Fruits: °¨ Red currants. °¨ Figs. °¨ Kiwi. °¨ Rhubarb. °· Meat and Other Protein Sources: °¨ Beans (dried). °¨ Soy burgers and other soybean products. °¨ Miso. °¨ Nuts (peanuts, almonds, pecans, cashews, hazelnuts). °¨ Nut butters. °¨ Sesame seeds and tahini (paste made of sesame seeds). °¨ Poppy seeds. °· Beverages: °¨ Chocolate drink mixes. °¨ Soy milk. °¨ Instant iced tea. °¨ Juices made from high-oxalate fruits or vegetables. °· Other: °¨ Carob. °¨ Chocolate. °¨ Fruitcake. °¨ Marmalades. °  °This information is not intended to replace advice given to you by your health care provider. Make sure you discuss any questions you have with your health care provider. °  °Document Released: 10/12/2010 Document Revised: 06/22/2013 Document Reviewed: 05/14/2013 °Elsevier Interactive Patient Education ©2016 [employer] ° ° °Kidney Stones °Kidney stones (urolithiasis) are deposits that form inside your kidneys. The intense pain is caused by the stone moving through the urinary tract. When the stone moves, the   ureter goes into spasm around the stone. The stone is usually passed in the urine.  °CAUSES  °· A disorder that makes certain neck glands produce too much parathyroid hormone (primary hyperparathyroidism). °· A buildup of uric acid crystals, similar to gout in your joints. °· Narrowing (stricture) of the ureter. °· A kidney obstruction present at birth (congenital  obstruction). °· Previous surgery on the kidney or ureters. °· Numerous kidney infections. °SYMPTOMS  °· Feeling sick to your stomach (nauseous). °· Throwing up (vomiting). °· Blood in the urine (hematuria). °· Pain that usually spreads (radiates) to the groin. °· Frequency or urgency of urination. °DIAGNOSIS  °· Taking a history and physical exam. °· Blood or urine tests. °· CT scan. °· Occasionally, an examination of the inside of the urinary bladder (cystoscopy) is performed. °TREATMENT  °· Observation. °· Increasing your fluid intake. °· Extracorporeal shock wave lithotripsy--This is a noninvasive procedure that uses shock waves to break up kidney stones. °· Surgery may be needed if you have severe pain or persistent obstruction. There are various surgical procedures. Most of the procedures are performed with the use of small instruments. Only small incisions are needed to accommodate these instruments, so recovery time is minimized. °The size, location, and chemical composition are all important variables that will determine the proper choice of action for you. Talk to your health care provider to better understand your situation so that you will minimize the risk of injury to yourself and your kidney.  °HOME CARE INSTRUCTIONS  °· Drink enough water and fluids to keep your urine clear or pale yellow. This will help you to pass the stone or stone fragments. °· Strain all urine through the provided strainer. Keep all particulate matter and stones for your health care provider to see. The stone causing the pain may be as small as a grain of salt. It is very important to use the strainer each and every time you pass your urine. The collection of your stone will allow your health care provider to analyze it and verify that a stone has actually passed. The stone analysis will often identify what you can do to reduce the incidence of recurrences. °· Only take over-the-counter or prescription medicines for pain,  discomfort, or fever as directed by your health care provider. °· Keep all follow-up visits as told by your health care provider. This is important. °· Get follow-up X-rays if required. The absence of pain does not always mean that the stone has passed. It may have only stopped moving. If the urine remains completely obstructed, it can cause loss of kidney function or even complete destruction of the kidney. It is your responsibility to make sure X-rays and follow-ups are completed. Ultrasounds of the kidney can show blockages and the status of the kidney. Ultrasounds are not associated with any radiation and can be performed easily in a matter of minutes. °· Make changes to your daily diet as told by your health care provider. You may be told to: °¨ Limit the amount of salt that you eat. °¨ Eat 5 or more servings of fruits and vegetables each day. °¨ Limit the amount of meat, poultry, fish, and eggs that you eat. °· Collect a 24-hour urine sample as told by your health care provider. You may need to collect another urine sample every 6-12 months. °SEEK MEDICAL CARE IF: °· You experience pain that is progressive and unresponsive to any pain medicine you have been prescribed. °SEEK IMMEDIATE MEDICAL CARE IF:  °·   Pain cannot be controlled with the prescribed medicine. °· You have a fever or shaking chills. °· The severity or intensity of pain increases over 18 hours and is not relieved by pain medicine. °· You develop a new onset of abdominal pain. °· You feel faint or pass out. °· You are unable to urinate. °  °This information is not intended to replace advice given to you by your health care provider. Make sure you discuss any questions you have with your health care provider. °  °Document Released: 06/17/2005 Document Revised: 03/08/2015 Document Reviewed: 11/18/2012 °Elsevier Interactive Patient Education ©2016 [employer] ° °Lithotripsy, Care After °Refer to this sheet in the next few weeks. These instructions  provide you with information on caring for yourself after your procedure. Your health care provider may also give you more specific instructions. Your treatment has been planned according to current medical practices, but problems sometimes occur. Call your health care provider if you have any problems or questions after your procedure. °WHAT TO EXPECT AFTER THE PROCEDURE  °· Your urine may have a red tinge for a few days after treatment. Blood loss is usually minimal. °· You may have soreness in the back or flank area. This usually goes away after a few days. The procedure can cause blotches or bruises on the back where the pressure wave enters the skin. These marks usually cause only minimal discomfort and should disappear in a short time. °· Stone fragments should begin to pass within 24 hours of treatment. However, a delayed passage is not unusual. °· You may have pain, discomfort, and feel sick to your stomach (nauseated) when the crushed fragments of stone are passed down the tube from the kidney to the bladder. Stone fragments can pass soon after the procedure and may last for up to 4-8 weeks. °· A small number of patients may have severe pain when stone fragments are not able to pass, which leads to an obstruction. °· If your stone is greater than 1 inch (2.5 cm) in diameter or if you have multiple stones that have a combined diameter greater than 1 inch (2.5 cm), you may require more than one treatment. °· If you had a stent placed prior to your procedure, you may experience some discomfort, especially during urination. You may experience the pain or discomfort in your flank or back, or you may experience a sharp pain or discomfort at the base of your penis or in your lower abdomen. The discomfort usually lasts only a few minutes after urinating. °HOME CARE INSTRUCTIONS  °· Rest at home until you feel your energy improving. °· Only take over-the-counter or prescription medicines for pain, discomfort, or  fever as directed by your health care provider. Depending on the type of lithotripsy, you may need to take antibiotics and anti-inflammatory medicines for a few days. °· Drink enough water and fluids to keep your urine clear or pale yellow. This helps "flush" your kidneys. It helps pass any remaining pieces of stone and prevents stones from coming back. °· Most people can resume daily activities within 1-2 days after standard lithotripsy. It can take longer to recover from laser and percutaneous lithotripsy. °· Strain all urine through the provided strainer. Keep all particulate matter and stones for your health care provider to see. The stone may be as small as a grain of salt. It is very important to use the strainer each and every time you pass your urine. Any stones that are found can be sent   to a medical lab for examination. °· Visit your health care provider for a follow-up appointment in a few weeks. Your doctor may remove your stent if you have one. Your health care provider will also check to see whether stone particles still remain. °SEEK MEDICAL CARE IF:  °· Your pain is not relieved by medicine. °· You have a lasting nauseous feeling. °· You feel there is too much blood in the urine. °· You develop persistent problems with frequent or painful urination that does not at least partially improve after 2 days following the procedure. °· You have a congested cough. °· You feel lightheaded. °· You develop a rash or any other signs that might suggest an allergic problem. °· You develop any reaction or side effects to your medicine(s). °SEEK IMMEDIATE MEDICAL CARE IF:  °· You experience severe back or flank pain or both. °· You see nothing but blood when you urinate. °· You cannot pass any urine at all. °· You have a fever or shaking chills. °· You develop shortness of breath, difficulty breathing, or chest pain. °· You develop vomiting that will not stop after 6-8 hours. °· You have a fainting episode. °  °This  information is not intended to replace advice given to you by your health care provider. Make sure you discuss any questions you have with your health care provider. °  °Document Released: 07/07/2007 Document Revised: 03/08/2015 Document Reviewed: 12/31/2012 °Elsevier Interactive Patient Education ©2016 [employer] ° °Lithotripsy °Lithotripsy is a treatment that can sometimes help eliminate kidney stones and pain that they cause. A form of lithotripsy, also known as extracorporeal shock wave lithotripsy, is a nonsurgical procedure that helps your body rid itself of the kidney stone when it is too big to pass on its own. Extracorporeal shock wave lithotripsy is a method of crushing a kidney stone with shock waves. These shock waves pass through your body and are focused on your stone. They cause the kidney stones to crumble while still in the urinary tract. It is then easier for the smaller pieces of stone to pass in the urine. °Lithotripsy usually takes about an hour. It is done in a hospital, a lithotripsy center, or a mobile unit. It usually does not require an overnight stay. Your health care provider will instruct you on preparation for the procedure. Your health care provider will tell you what to expect afterward. °LET YOUR HEALTH CARE PROVIDER KNOW ABOUT: °· Any allergies you have. °· All medicines you are taking, including vitamins, herbs, eye drops, creams, and over-the-counter medicines. °· Previous problems you or members of your family have had with the use of anesthetics. °· Any blood disorders you have. °· Previous surgeries you have had. °· Medical conditions you have. °RISKS AND COMPLICATIONS °Generally, lithotripsy for kidney stones is a safe procedure. However, as with any procedure, complications can occur. Possible complications include: °· Infection. °· Bleeding of the kidney. °· Bruising of the kidney or skin. °· Obstruction of the ureter. °· Failure of the stone to fragment. °BEFORE THE  PROCEDURE °· Do not eat or drink for 6-8 hours prior to the procedure. You may, however, take the medications with a sip of water that your physician instructs you to take °· Do not take aspirin or aspirin-containing products for 7 days prior to your procedure °· Do not take nonsteroidal anti-inflammatory products for 7 days prior to your procedure °PROCEDURE °A stent (flexible tube with holes) may be placed in your ureter. The ureter   is the tube that transports the urine from the kidneys to the bladder. Your health care provider may place a stent before the procedure. This will help keep urine flowing from the kidney if the fragments of the stone block the ureter. You may have an IV tube placed in one of your veins to give you fluids and medicines. These medicines may help you relax or make you sleep. During the procedure, you will lie comfortably on a fluid-filled cushion or in a warm-water bath. After an X-ray or ultrasound exam to locate your stone, shock waves are aimed at the stone. If you are awake, you may feel a tapping sensation as the shock waves pass through your body. If large stone particles remain after treatment, a second procedure may be necessary at a later date. °For comfort during the test: °· Relax as much as possible. °· Try to remain still as much as possible. °· Try to follow instructions to speed up the test. °· Let your health care provider know if you are uncomfortable, anxious, or in pain. °AFTER THE PROCEDURE  °After surgery, you will be taken to the recovery area. A nurse will watch and check your progress. Once you're awake, stable, and taking fluids well, you will be allowed to go home as long as there are no problems. You will also be allowed to pass your urine before discharge. You may be given antibiotics to help prevent infection. You may also be prescribed pain medicine if needed. In a week or two, your health care provider may remove your stent, if you have one. You may first  have an X-ray exam to check on how successful the fragmentation of your stone has been and how much of the stone has passed. Your health care provider will check to see whether or not stone particles remain. °SEEK IMMEDIATE MEDICAL CARE IF: °· You develop a fever or shaking chills. °· Your pain is not relieved by medicine. °· You feel sick to your stomach (nauseated) and you vomit. °· You develop heavy bleeding. °· You have difficulty urinating. °· You start to pass your stent from your penis. °  °This information is not intended to replace advice given to you by your health care provider. Make sure you discuss any questions you have with your health care provider. °  °Document Released: 06/14/2000 Document Revised: 07/08/2014 Document Reviewed: 12/31/2012 °Elsevier Interactive Patient Education ©2016 [employer] ° °

## 2015-05-12 ENCOUNTER — Encounter: Payer: Self-pay | Admitting: Urology

## 2015-05-16 ENCOUNTER — Telehealth: Payer: Self-pay

## 2015-05-16 NOTE — Telephone Encounter (Signed)
Entresto approved by [employer]uer[employer], request ID [id number]. Good through 05/09/2016.

## 2015-05-18 ENCOUNTER — Telehealth: Payer: Self-pay | Admitting: *Deleted

## 2015-05-18 NOTE — Telephone Encounter (Signed)
I spoke with pt and reviewed echo results with him.  He reports ureteral stent was removed yesterday.    Deirdre Pippinserry, Abdou D - 05/09/15    Duke SalviaSteven C Klein, MD  Sent: Thu May 11, 2015 6:09 PM   To: Jefferey PicaHeather C McGhee, RN     Result Note    Please Inform Patient Echo showed Stable imparired 20-30% heart muscle function  When is his ureteral stent out?

## 2015-05-24 NOTE — Telephone Encounter (Signed)
Per Dr. Graciela HusbandsKlein- ok to schedule for S-ICD implant. I called the patient and he wants to wait until after the 1st of the year to schedule anything. He will call me back when he is ready to schedule.

## 2015-06-14 ENCOUNTER — Ambulatory Visit: Payer: PRIVATE HEALTH INSURANCE | Admitting: Interventional Cardiology

## 2015-07-10 ENCOUNTER — Ambulatory Visit: Payer: PRIVATE HEALTH INSURANCE | Admitting: Interventional Cardiology

## 2015-07-26 ENCOUNTER — Encounter: Payer: Self-pay | Admitting: Cardiology

## 2015-07-26 ENCOUNTER — Ambulatory Visit (INDEPENDENT_AMBULATORY_CARE_PROVIDER_SITE_OTHER): Payer: Managed Care, Other (non HMO) | Admitting: Cardiology

## 2015-07-26 VITALS — BP 100/66 | HR 95 | Wt 207.8 lb

## 2015-07-26 DIAGNOSIS — I255 Ischemic cardiomyopathy: Secondary | ICD-10-CM

## 2015-07-26 DIAGNOSIS — N200 Calculus of kidney: Secondary | ICD-10-CM

## 2015-07-26 DIAGNOSIS — I10 Essential (primary) hypertension: Secondary | ICD-10-CM

## 2015-07-26 DIAGNOSIS — I420 Dilated cardiomyopathy: Secondary | ICD-10-CM

## 2015-07-26 DIAGNOSIS — I739 Peripheral vascular disease, unspecified: Secondary | ICD-10-CM

## 2015-07-26 DIAGNOSIS — I2510 Atherosclerotic heart disease of native coronary artery without angina pectoris: Secondary | ICD-10-CM

## 2015-07-26 DIAGNOSIS — I5022 Chronic systolic (congestive) heart failure: Secondary | ICD-10-CM

## 2015-07-26 DIAGNOSIS — Z720 Tobacco use: Secondary | ICD-10-CM | POA: Diagnosis not present

## 2015-07-26 NOTE — Progress Notes (Signed)
Cardiology Office Note   Date:  07/26/2015   ID:  [name] Walker, DOB June 02, 1956, MRN [id number]  PCP:  Barbette Reichmann, MD  Cardiologist:  Dr. Katrinka Blazing    Chief Complaint  Patient presents with  . Coronary Artery Disease    PT HAS NO COMPLAINTS TODAY  . Cardiomyopathy    ischemic, but able to do ADLs      History of Present Illness: [name] Walker is a 60 y.o. male who presents for his CAD and ICM with EF 25-30% on most recent Echo 05/2015.    He has a history of coronary artery disease with an MI 2013 and stenting of his LAD. Catheterization 7/16 demonstrated anterior wall akinesis with an ejection fraction of 20-25% and patent coronaries. At that time ICD was recommended but not consummated because of issues with hematuria.  9/16 he presented to the hospital with chest pain and had a non-STEMI with peak troponin of 12.5. He underwent repeat catheterization demonstrating no significant changes in his coronary anatomy; the explanation for his non-STEMI was not hypothesized in the records that I reviewed  There was a problem with hematuria requiring nephrostomy tube. His Plavix was discontinued and aspirin was maintained. Lithotripsy has been successful in getting rid of the stone. A stent and  nephrostomy have been removed.   Pt is on entresto and BP is borderline 100/66.  No chest pain, no SOB.  He had one episode of syncope- he jumped up from a nap to answer the phone and went out- woke remembering everything -prior to this he had episodes of dizziness with quick position changes.  No awareness of rapid HR.   He is down to 10 cigarettes per day.  I asked him to stop.    Past Medical History  Diagnosis Date  . Varicose veins   . Hypertension   . Chronic kidney disease     stone  . Coronary artery disease 2013    BMS to LAD  . MI (myocardial infarction) (HCC) 06/19/2012    being considered for defibilator  . NSTEMI (non-ST elevated myocardial infarction) (HCC) 03/2015   Patent BMS - LAD stent.  . Ischemic cardiomyopathy     EF 25-30%     Past Surgical History  Procedure Laterality Date  . Coronary stent placement    . Left heart catheterization with coronary angiogram Right 06/19/2012    Procedure: LEFT HEART CATHETERIZATION WITH CORONARY ANGIOGRAM;  Surgeon: Lesleigh Noe, MD;  Location: [facility] CATH LAB;  Service: Cardiovascular;  Laterality: Right;  . Percutaneous coronary stent intervention (pci-s) Right 06/19/2012    Procedure: PERCUTANEOUS CORONARY STENT INTERVENTION (PCI-S);  Surgeon: Lesleigh Noe, MD;  Location: [facility] CATH LAB;  Service: Cardiovascular;  Laterality: Right;  . Cardiac catheterization N/A 01/09/2015    Procedure: Left Heart Cath and Coronary Angiography;  Surgeon: Lyn Records, MD;  Location: [facility] INVASIVE CV LAB;  Service: Cardiovascular;  Laterality: N/A;  . Cystoscopy with stent placement Right 03/07/2015    Procedure: CYSTOSCOPY WITH STENT PLACEMENT;  Surgeon: Orson Ape, MD;  Location: [facility] ORS;  Service: Urology;  Laterality: Right;  . Extracorporeal shock wave lithotripsy Right 03/09/2015    Procedure: EXTRACORPOREAL SHOCK WAVE LITHOTRIPSY (ESWL);  Surgeon: Orson Ape, MD;  Location: [facility] ORS;  Service: Urology;  Laterality: Right;  . Cardiac catheterization N/A 03/13/2015    Procedure: Left Heart Cath and Coronary Angiography;  Surgeon: Lyn Records, MD;  Location: [facility] INVASIVE CV LAB;  Service: Cardiovascular;  Laterality: N/A;  . Extracorporeal shock wave lithotripsy Right 05/11/2015    Procedure: EXTRACORPOREAL SHOCK WAVE LITHOTRIPSY (ESWL);  Surgeon: Orson Ape, MD;  Location: [facility] ORS;  Service: Urology;  Laterality: Right;     Current Outpatient Prescriptions  Medication Sig Dispense Refill  . albuterol (PROVENTIL HFA;VENTOLIN HFA) 108 (90 BASE) MCG/ACT inhaler Inhale 2 puffs into the lungs every 6 (six) hours as needed for wheezing or shortness of breath (chest tightness). ProAir    . aspirin EC 81 MG EC  tablet Take 1 tablet (81 mg total) by mouth daily.    Marland Kitchen atorvastatin (LIPITOR) 40 MG tablet Take 1 tablet (40 mg total) by mouth daily at 6 PM. 30 tablet 6  . carvedilol (COREG) 12.5 MG tablet Take 12.5 mg by mouth 2 (two) times daily with a meal.    . nitroGLYCERIN (NITROSTAT) 0.4 MG SL tablet Place 1 tablet (0.4 mg total) under the tongue every 5 (five) minutes x 3 doses as needed for chest pain. 25 tablet 0  . sacubitril-valsartan (ENTRESTO) 24-26 MG Take 1 tablet by mouth 2 (two) times daily. 60 tablet 6  . tamsulosin (FLOMAX) 0.4 MG CAPS capsule Take 1 capsule (0.4 mg total) by mouth daily. 30 capsule 11   No current facility-administered medications for this visit.    Allergies:   Codeine and Nucynta    Social History:  The patient  reports that he has been smoking Cigarettes.  He has a 5 pack-year smoking history. He has never used smokeless tobacco. He reports that he drinks alcohol. He reports that he does not use illicit drugs.   Family History:  The patient's family history includes Cancer in his father; Heart attack in his brother; Heart murmur in his mother.    ROS:  General:no colds or fevers, no weight changes Skin:no rashes or ulcers HEENT:no blurred vision, no congestion CV:see HPI PUL:see HPI GI:no diarrhea constipation or melena, no indigestion GU:no hematuria, no dysuria MS:no joint pain, no claudication Neuro:+ episode of syncope, no lightheadedness Endo:no diabetes, no thyroid disease  Wt Readings from Last 3 Encounters:  07/26/15 207 lb 12.8 oz (94.257 kg)  05/11/15 205 lb (92.987 kg)  04/20/15 205 lb 12.8 oz (93.35 kg)     PHYSICAL EXAM: VS:  BP 100/66 mmHg  Pulse 95  Wt 207 lb 12.8 oz (94.257 kg)  SpO2 96% , BMI Body mass index is 28.18 kg/(m^2). General:Pleasant affect, NAD Skin:Warm and dry, brisk capillary refill HEENT:normocephalic, sclera clear, mucus membranes moist Neck:supple, no JVD, no bruits  Heart:S1S2 RRR without murmur, gallup, rub or  click Lungs:clear without rales, rhonchi, or wheezes AVW:UJWJ, non tender, + BS, do not palpate liver spleen or masses Ext:no lower ext edema, 2+ pedal pulses, 2+ radial pulses Neuro:alert and oriented, MAE, follows commands, + facial symmetry    EKG:  EKG is NOT ordered today.    Recent Labs: 03/12/2015: BUN 8; Potassium 3.8; Sodium 140 03/13/2015: Creatinine, Ser 0.76 03/14/2015: Hemoglobin 13.0; Platelets 141*    Lipid Panel    Component Value Date/Time   CHOL 111 03/12/2015 0420   TRIG 121 03/12/2015 0420   HDL 32* 03/12/2015 0420   CHOLHDL 3.5 03/12/2015 0420   VLDL 24 03/12/2015 0420   LDLCALC 55 03/12/2015 0420       Other studies Reviewed: Additional studies/ records that were reviewed today include: last cath, echo most recent.    ASSESSMENT AND PLAN:  1. Chronic systolic heart failure No evidence of  volume overload  2. Ischemic cardiomyopathy EF 25-30% 05/2015 will refer back to Dr. Graciela Husbands for possible ICD placement.   3. Atherosclerosis of native coronary artery of native heart without angina pectoris Widely patent LAD stent from cath in July.  4. Claudication nonlimiting  5. Dyslipidemia On therapy last labs in Sept.   6. Essential hypertension, benign Relatively low blood pressures on heart failure therapy  7. Tobacco abuse continues to smoke. Encouraged to stop. He is down to 10 cigarettes.   8. Kidney stone on right side Right kidney stone with lithotripsy and now removal of stent. .  9. Minimal sleep apnea, no need for CPAP.   10. Orthostatic syncope- brief out for < 1 min. If any further episodes he will call, no other episodes after sitting on side of bed or chair for 1 min.prior to standing.    Current medicines are reviewed with the patient today.  The patient Has no concerns regarding medicines.  The following changes have been made:  See above Labs/ tests ordered today include:see above  Disposition:   FU:  see  above  Signed, Leone Brand, NP  07/26/2015 4:11 PM    [facility] [address] [place], [place], [place]  [address]/ [address] [place], [place] Phone: [phone]; Fax: [phone]  [phone]

## 2015-07-26 NOTE — Patient Instructions (Addendum)
Medication Instructions:  Your physician recommends that you continue on your current medications as directed. Please refer to the Current Medication list given to you today.  Labwork: NONE  Testing/Procedures: NONE  Follow-Up: Your physician wants you to follow-up in: 1 - 2 months with Dr. Graciela Husbands for ICD.  Your physician wants you to follow-up in: 6 month with Dr. Marlou Starks will receive a reminder letter in the mail two months in advance. If you don't receive a letter, please call our office to schedule the follow-up appointment.   If you need a refill on your cardiac medications before your next appointment, please call your pharmacy.

## 2015-09-25 ENCOUNTER — Ambulatory Visit: Payer: Managed Care, Other (non HMO) | Admitting: Internal Medicine

## 2015-10-10 ENCOUNTER — Other Ambulatory Visit: Payer: Self-pay | Admitting: Student

## 2015-10-26 ENCOUNTER — Other Ambulatory Visit: Payer: Self-pay | Admitting: Student

## 2015-11-23 ENCOUNTER — Other Ambulatory Visit: Payer: Self-pay | Admitting: Student

## 2015-11-28 ENCOUNTER — Other Ambulatory Visit: Payer: Self-pay | Admitting: Student

## 2015-12-01 ENCOUNTER — Other Ambulatory Visit: Payer: Self-pay | Admitting: Interventional Cardiology

## 2015-12-04 ENCOUNTER — Ambulatory Visit (INDEPENDENT_AMBULATORY_CARE_PROVIDER_SITE_OTHER): Payer: Managed Care, Other (non HMO) | Admitting: Internal Medicine

## 2015-12-04 ENCOUNTER — Encounter: Payer: Self-pay | Admitting: Internal Medicine

## 2015-12-04 VITALS — BP 124/76 | HR 57 | Ht 71.5 in | Wt 206.2 lb

## 2015-12-04 DIAGNOSIS — I255 Ischemic cardiomyopathy: Secondary | ICD-10-CM

## 2015-12-04 DIAGNOSIS — I420 Dilated cardiomyopathy: Secondary | ICD-10-CM

## 2015-12-04 DIAGNOSIS — I5022 Chronic systolic (congestive) heart failure: Secondary | ICD-10-CM

## 2015-12-04 MED ORDER — SACUBITRIL-VALSARTAN 24-26 MG PO TABS: ORAL_TABLET | ORAL | Status: DC

## 2015-12-04 NOTE — Patient Instructions (Addendum)
Medication Instructions: - Your physician recommends that you continue on your current medications as directed. Please refer to the Current Medication list given to you today.  Labwork: - none  Procedures/Testing: - Your physician has requested that you have an echocardiogram- in the [place] office. Echocardiography is a painless test that uses sound waves to create images of your heart. It provides your doctor with information about the size and shape of your heart and how well your heart's chambers and valves are working. This procedure takes approximately one hour. There are no restrictions for this procedure.  Follow-Up: - Dr. Graciela HusbandsKlein will see you back pending the results of your echo.  - Your physician wants you to follow-up in: 6 months with Dr. Katrinka BlazingSmith. You will receive a reminder letter in the mail two months in advance. If you don't receive a letter, please call our office to schedule the follow-up appointment.   Any Additional Special Instructions Will Be Listed Below (If Applicable).     If you need a refill on your cardiac medications before your next appointment, please call your pharmacy.

## 2015-12-04 NOTE — Progress Notes (Signed)
Patient Care Team: Barbette Reichmann, MD as PCP - General (Internal Medicine)   HPI  [name] Walker is a 60 y.o. male Seen for reconsideration of an ICD.  He was seen in July. He has a history of coronary artery disease with an MI 2013 and stenting of his LAD. Catheterization 7/16 demonstrated anterior wall akinesis with an ejection fraction of 20-25% and patent coronaries. At that time ICD was recommended but not consummated because of issues with hematuria 9/16 he presented to the hospital with chest pain and had a non-STEMI with peak troponin of 12.5. He underwent repeat catheterization demonstrating no significant changes in his coronary anatomy; the explanation for his non-STEMI was not hypothesized in the records that I reviewed  The issues related to his kidney stones and nephrostomy and stenting have all resolved. .  He has no chest pain. He is having mild shortness of breath but this is stable. He is having no peripheral edema     Past Medical History  Diagnosis Date  . Varicose veins   . Hypertension   . Chronic kidney disease     stone  . Coronary artery disease 2013    BMS to LAD  . MI (myocardial infarction) (HCC) 06/19/2012    being considered for defibilator  . NSTEMI (non-ST elevated myocardial infarction) (HCC) 03/2015    Patent BMS - LAD stent.  . Ischemic cardiomyopathy     EF 25-30%     Past Surgical History  Procedure Laterality Date  . Coronary stent placement    . Left heart catheterization with coronary angiogram Right 06/19/2012    Procedure: LEFT HEART CATHETERIZATION WITH CORONARY ANGIOGRAM;  Surgeon: Lesleigh Noe, MD;  Location: [facility] CATH LAB;  Service: Cardiovascular;  Laterality: Right;  . Percutaneous coronary stent intervention (pci-s) Right 06/19/2012    Procedure: PERCUTANEOUS CORONARY STENT INTERVENTION (PCI-S);  Surgeon: Lesleigh Noe, MD;  Location: [facility] CATH LAB;  Service: Cardiovascular;  Laterality: Right;  . Cardiac  catheterization N/A 01/09/2015    Procedure: Left Heart Cath and Coronary Angiography;  Surgeon: Lyn Records, MD;  Location: [facility] INVASIVE CV LAB;  Service: Cardiovascular;  Laterality: N/A;  . Cystoscopy with stent placement Right 03/07/2015    Procedure: CYSTOSCOPY WITH STENT PLACEMENT;  Surgeon: Orson Ape, MD;  Location: [facility] ORS;  Service: Urology;  Laterality: Right;  . Extracorporeal shock wave lithotripsy Right 03/09/2015    Procedure: EXTRACORPOREAL SHOCK WAVE LITHOTRIPSY (ESWL);  Surgeon: Orson Ape, MD;  Location: [facility] ORS;  Service: Urology;  Laterality: Right;  . Cardiac catheterization N/A 03/13/2015    Procedure: Left Heart Cath and Coronary Angiography;  Surgeon: Lyn Records, MD;  Location: [facility] INVASIVE CV LAB;  Service: Cardiovascular;  Laterality: N/A;  . Extracorporeal shock wave lithotripsy Right 05/11/2015    Procedure: EXTRACORPOREAL SHOCK WAVE LITHOTRIPSY (ESWL);  Surgeon: Orson Ape, MD;  Location: [facility] ORS;  Service: Urology;  Laterality: Right;    Current Outpatient Prescriptions  Medication Sig Dispense Refill  . albuterol (PROVENTIL HFA;VENTOLIN HFA) 108 (90 BASE) MCG/ACT inhaler Inhale 2 puffs into the lungs every 6 (six) hours as needed for wheezing or shortness of breath (chest tightness). ProAir    . aspirin EC 81 MG EC tablet Take 1 tablet (81 mg total) by mouth daily.    Marland Kitchen atorvastatin (LIPITOR) 40 MG tablet TAKE 1 TABLET (40 MG TOTAL) BY MOUTH DAILY AT 6 PM. 30 tablet 5  . carvedilol (COREG)  12.5 MG tablet Take 12.5 mg by mouth 2 (two) times daily with a meal.    . nitroGLYCERIN (NITROSTAT) 0.4 MG SL tablet Place 1 tablet (0.4 mg total) under the tongue every 5 (five) minutes x 3 doses as needed for chest pain. 25 tablet 0  . sacubitril-valsartan (ENTRESTO) 24-26 MG Take 1 tablet by mouth 2 (two) times daily. 60 tablet 6  . tamsulosin (FLOMAX) 0.4 MG CAPS capsule Take 1 capsule (0.4 mg total) by mouth daily. 30 capsule 11   No current  facility-administered medications for this visit.    Allergies  Allergen Reactions  . Codeine Other (See Comments)    Too drowsy  . Nucynta [Tapentadol] Nausea Only and Other (See Comments)    Dizziness, woozy      Review of Systems negative except from HPI and PMH  Physical Exam BP 124/76 mmHg  Pulse 57  Ht 5' 11.5" (1.816 m)  Wt 206 lb 3.2 oz (93.532 kg)  BMI 28.36 kg/m2 Well developed and well nourished in no acute distress HENT normal E scleral and icterus clear Neck Supple JVP flat; carotids brisk and full Clear to ausculation  Regular rate and rhythm, no murmurs gallops or rub Soft with active bowel sounds No clubbing cyanosis  Edema Alert and oriented, grossly normal motor and sensory function Skin Warm and Dry  ECG demonstrates sinus rhythm at 63 Interval 17/08/39  Assessment and  Plan  Ischemic cardiomyopathy  Congestive heart.-Class II  At this point we will reassess left ventricular function. In the event that he has persistent LV dysfunction, we will proceed with ICD implantation. We have reviewed again S ICD versus transvenous ICD and he would like to proceed with the former in the event that ICD implantation is recommended.

## 2015-12-07 NOTE — Addendum Note (Signed)
Addended by: Reesa ChewJONES, Shatha Hooser G on: 12/07/2015 11:31 AM   Modules accepted: Orders

## 2015-12-09 ENCOUNTER — Other Ambulatory Visit: Payer: Self-pay | Admitting: Internal Medicine

## 2015-12-15 ENCOUNTER — Other Ambulatory Visit: Payer: Self-pay | Admitting: Interventional Cardiology

## 2015-12-18 ENCOUNTER — Ambulatory Visit (INDEPENDENT_AMBULATORY_CARE_PROVIDER_SITE_OTHER): Payer: Managed Care, Other (non HMO)

## 2015-12-18 ENCOUNTER — Other Ambulatory Visit: Payer: Self-pay

## 2015-12-18 DIAGNOSIS — I5022 Chronic systolic (congestive) heart failure: Secondary | ICD-10-CM | POA: Diagnosis not present

## 2015-12-18 DIAGNOSIS — I255 Ischemic cardiomyopathy: Secondary | ICD-10-CM | POA: Diagnosis not present

## 2015-12-18 DIAGNOSIS — I420 Dilated cardiomyopathy: Secondary | ICD-10-CM

## 2016-04-15 ENCOUNTER — Other Ambulatory Visit: Payer: Self-pay | Admitting: Interventional Cardiology

## 2016-07-11 ENCOUNTER — Other Ambulatory Visit: Payer: Self-pay | Admitting: *Deleted

## 2016-07-11 MED ORDER — CARVEDILOL 12.5 MG PO TABS: 12.5000 mg | ORAL_TABLET | Freq: Two times a day (BID) | ORAL | 2 refills | Status: DC

## 2016-07-11 MED ORDER — ATORVASTATIN CALCIUM 40 MG PO TABS: 40.0000 mg | ORAL_TABLET | Freq: Every day | ORAL | 2 refills | Status: DC

## 2016-09-11 ENCOUNTER — Encounter: Payer: Self-pay | Admitting: Interventional Cardiology

## 2016-09-20 ENCOUNTER — Ambulatory Visit: Payer: Managed Care, Other (non HMO) | Admitting: Interventional Cardiology

## 2016-09-30 ENCOUNTER — Encounter: Payer: Self-pay | Admitting: Interventional Cardiology

## 2016-10-31 ENCOUNTER — Ambulatory Visit: Payer: Managed Care, Other (non HMO) | Admitting: Interventional Cardiology

## 2016-11-11 ENCOUNTER — Other Ambulatory Visit: Payer: Self-pay | Admitting: Internal Medicine

## 2016-11-20 NOTE — Progress Notes (Signed)
Cardiology Office Note    Date:  11/21/2016   ID:  [name] OatsBobby D Sellman, DOB [birth year], MRN [id number]  PCP:  Barbette ReichmannHande, Vishwanath, MD  Cardiologist: Lesleigh NoeHenry W Smith III, MD   Chief Complaint  Patient presents with  . Coronary Artery Disease  . Congestive Heart Failure    History of Present Illness:  [name] Walker is a 61 y.o. male who presents for anterior myocardial infarction, ischemic cardiomyopathy with EF less than 35%, chronic systolic heart failure, functional class II, tobacco use, hypertension, and sleep apnea  He saw Dr. Graciela HusbandsKlein and we were planning to have primary prevention SCD performed. This never happened and is speaking to the patient he is now not in favor of going forward.  He denies angina. He still smokes between one half to one pack of cigarettes per day. His job is changed and he is under a lot less stress. He is sleeping well. He denies claudication.   Past Medical History:  Diagnosis Date  . Chronic kidney disease    stone  . Coronary artery disease 2013   BMS to LAD  . Hypertension   . Ischemic cardiomyopathy    EF 25-30%   . MI (myocardial infarction) (HCC) 06/19/2012   being considered for defibilator  . NSTEMI (non-ST elevated myocardial infarction) (HCC) 03/2015   Patent BMS - LAD stent.  . Varicose veins     Past Surgical History:  Procedure Laterality Date  . CARDIAC CATHETERIZATION N/A 01/09/2015   Procedure: Left Heart Cath and Coronary Angiography;  Surgeon: Lyn RecordsHenry W Smith, MD;  [facility];  Service: Cardiovascular;  Laterality: N/A;  . CARDIAC CATHETERIZATION N/A 03/13/2015   Procedure: Left Heart Cath and Coronary Angiography;  Surgeon: Lyn RecordsHenry W Smith, MD;  [facility];  Service: Cardiovascular;  Laterality: N/A;  . CORONARY STENT PLACEMENT    . CYSTOSCOPY WITH STENT PLACEMENT Right 03/07/2015   Procedure: CYSTOSCOPY WITH STENT PLACEMENT;  Surgeon: Orson ApeMichael R Wolff, MD;  Location: [facility] ORS;  Service: Urology;   Laterality: Right;  . EXTRACORPOREAL SHOCK WAVE LITHOTRIPSY Right 03/09/2015   Procedure: EXTRACORPOREAL SHOCK WAVE LITHOTRIPSY (ESWL);  Surgeon: Orson ApeMichael R Wolff, MD;  Location: [facility] ORS;  Service: Urology;  Laterality: Right;  . EXTRACORPOREAL SHOCK WAVE LITHOTRIPSY Right 05/11/2015   Procedure: EXTRACORPOREAL SHOCK WAVE LITHOTRIPSY (ESWL);  Surgeon: Orson ApeMichael R Wolff, MD;  Location: [facility] ORS;  Service: Urology;  Laterality: Right;  . LEFT HEART CATHETERIZATION WITH CORONARY ANGIOGRAM Right 06/19/2012   Procedure: LEFT HEART CATHETERIZATION WITH CORONARY ANGIOGRAM;  Surgeon: Lesleigh NoeHenry W Smith III, MD;  [facility];  Service: Cardiovascular;  Laterality: Right;  . PERCUTANEOUS CORONARY STENT INTERVENTION (PCI-S) Right 06/19/2012   Procedure: PERCUTANEOUS CORONARY STENT INTERVENTION (PCI-S);  Surgeon: Lesleigh NoeHenry W Smith III, MD;  [facility];  Service: Cardiovascular;  Laterality: Right;    Current Medications: Outpatient Medications Prior to Visit  Medication Sig Dispense Refill  . albuterol (PROVENTIL HFA;VENTOLIN HFA) 108 (90 BASE) MCG/ACT inhaler Inhale 2 puffs into the lungs every 6 (six) hours as needed for wheezing or shortness of breath (chest tightness). ProAir    . aspirin EC 81 MG EC tablet Take 1 tablet (81 mg total) by mouth daily.    Marland Kitchen. atorvastatin (LIPITOR) 40 MG tablet Take 1 tablet (40 mg total) by mouth daily. 30 tablet 2  . carvedilol (COREG) 12.5 MG tablet Take 1 tablet (12.5 mg total) by mouth 2 (two) times daily. 60 tablet 2  .  ENTRESTO 24-26 MG TAKE ONE TABLET TWICE DAILY 60 tablet 0  . nitroGLYCERIN (NITROSTAT) 0.4 MG SL tablet Place 1 tablet (0.4 mg total) under the tongue every 5 (five) minutes x 3 doses as needed for chest pain. 25 tablet 0  . tamsulosin (FLOMAX) 0.4 MG CAPS capsule Take 1 capsule (0.4 mg total) by mouth daily. (Patient not taking: Reported on 11/21/2016) 30 capsule 11   No facility-administered medications prior to visit.      Allergies:    Codeine and Nucynta [tapentadol]   Social History   Social History  . Marital status: Single    Spouse name: N/A  . Number of children: N/A  . Years of education: N/A   Social History Main Topics  . Smoking status: Current Every Day Smoker    Packs/day: 0.50    Years: 10.00    Types: Cigarettes  . Smokeless tobacco: Never Used  . Alcohol use 0.0 oz/week     Comment: occ  . Drug use: No  . Sexual activity: Not Asked   Other Topics Concern  . None   Social History Narrative  . None     Family History:  The patient's family history includes Cancer in his father; Heart attack in his brother; Heart murmur in his mother.   ROS:   Please see the history of present illness.    Chronic cough. Appetite is stable. Weight is stable. Decreased hearing.  All other systems reviewed and are negative.   PHYSICAL EXAM:   VS:  BP 118/80   Pulse (!) 52   Ht 5' 11.5" (1.816 m)   Wt 216 lb (98 kg)   BMI 29.71 kg/m    GEN: Well nourished, well developed, in no acute distress . Smells of cigarette smoke. HEENT: normal  Neck: no JVD, carotid bruits, or masses Cardiac: RRR; no murmurs, rubs, or gallops,no edema  Respiratory:  clear to auscultation bilaterally, normal work of breathing GI: soft, nontender, nondistended, + BS MS: no deformity or atrophy  Skin: warm and dry, no rash Neuro:  Alert and Oriented x 3, Strength and sensation are intact Psych: euthymic mood, full affect  Wt Readings from Last 3 Encounters:  11/21/16 216 lb (98 kg)  12/04/15 206 lb 3.2 oz (93.5 kg)  07/26/15 207 lb 12.8 oz (94.3 kg)      Studies/Labs Reviewed:   EKG:  EKG  Sinus bradycardia at 52 bpm. QS pattern V1 and V2 as well as aVL. Nonspecific T-wave flattening. No significant change compared to prior.  Recent Labs: No results found for requested labs within last 8760 hours.   Lipid Panel    Component Value Date/Time   CHOL 111 03/12/2015 0420   TRIG 121 03/12/2015 0420   HDL 32 (L)  03/12/2015 0420   CHOLHDL 3.5 03/12/2015 0420   VLDL 24 03/12/2015 0420   LDLCALC 55 03/12/2015 0420    Additional studies/ records that were reviewed today include:  Echocardiogram June 2017  Study Conclusions   - Left ventricle: The cavity size was moderately dilated. Systolic   function was moderately to severely reduced. The estimated   ejection fraction was in the range of 30% to 35%. Diffuse   hypokinesis. Hypokinesis of the anterior myocardium. Hypokinesis   of the anteroseptal myocardium. Akinesis of the apical   myocardium. Doppler parameters are consistent with abnormal left   ventricular relaxation (grade 1 diastolic dysfunction). - Aortic root: The aortic root was mildly dilated. - Mitral valve: There was mild  regurgitation. - Right ventricle: Systolic function was normal. - Pulmonary arteries: Systolic pressure was within the normal   range. - Inferior vena cava: The vessel was normal in size. The   respirophasic diameter changes were in the normal range (>= 50%),   consistent with normal central venous pressure.   ASSESSMENT:    1. Chronic systolic heart failure (HCC)   2. Atherosclerosis of native coronary artery of native heart without angina pectoris   3. Essential hypertension, benign   4. Old MI (myocardial infarction)   5. Claudication (HCC)   6. Dyslipidemia      PLAN:  In order of problems listed above:  1. No evidence of volume overload. Last LVEF was less than 35% in June 2017. AICD was arranged but he refuses now to proceed. He has not been back to see Dr. Graciela Husbands. There is no evidence of volume overload. Maintain current therapy for heart failure which includes Coreg 12.5 mg twice a day and Entresto 24-26 milligrams twice a day. 2. Encouraged to stop smoking. LDL target less than 70. Aerobic exercise encouraged. 3. 2 g sodium diet. Target blood pressure less than 130/90 mmHg. 4. Not addressed 5. Not currently limiting issue 6. LDL target less  than 70   Risk factor modification. We discussed the importance of AICD for primary prevention. He has not convinced that he will also have the stented this time. We will consider at a later date and certainly if he develops heart feed her symptoms I will press a little harder to hopefully get him implanted. Plan at this time aerobic activity, clinical follow-up in one year.  Medication Adjustments/Labs and Tests Ordered: Current medicines are reviewed at length with the patient today.  Concerns regarding medicines are outlined above.  Medication changes, Labs and Tests ordered today are listed in the Patient Instructions below. There are no Patient Instructions on file for this visit.   Signed, Lesleigh Noe, MD  11/21/2016 4:32 PM    [facility] [address] [place], [place], [place]  [address] Phone: [phone]; Fax: [phone]

## 2016-11-21 ENCOUNTER — Ambulatory Visit (INDEPENDENT_AMBULATORY_CARE_PROVIDER_SITE_OTHER): Payer: BLUE CROSS/BLUE SHIELD | Admitting: Interventional Cardiology

## 2016-11-21 ENCOUNTER — Encounter: Payer: Self-pay | Admitting: Interventional Cardiology

## 2016-11-21 VITALS — BP 118/80 | HR 52 | Ht 71.5 in | Wt 216.0 lb

## 2016-11-21 DIAGNOSIS — E785 Hyperlipidemia, unspecified: Secondary | ICD-10-CM | POA: Diagnosis not present

## 2016-11-21 DIAGNOSIS — I739 Peripheral vascular disease, unspecified: Secondary | ICD-10-CM | POA: Diagnosis not present

## 2016-11-21 DIAGNOSIS — I5022 Chronic systolic (congestive) heart failure: Secondary | ICD-10-CM | POA: Diagnosis not present

## 2016-11-21 DIAGNOSIS — I2510 Atherosclerotic heart disease of native coronary artery without angina pectoris: Secondary | ICD-10-CM | POA: Diagnosis not present

## 2016-11-21 DIAGNOSIS — I10 Essential (primary) hypertension: Secondary | ICD-10-CM | POA: Diagnosis not present

## 2016-11-21 DIAGNOSIS — I252 Old myocardial infarction: Secondary | ICD-10-CM | POA: Diagnosis not present

## 2016-11-21 NOTE — Patient Instructions (Signed)
Medication Instructions:  None  Labwork: Your physician recommends that you return for lab work at your earliest convenience (Lipid and liver)    Testing/Procedures: None  Follow-Up: Your physician wants you to follow-up in: 1 year with Dr. Katrinka BlazingSmith.  You will receive a reminder letter in the mail two months in advance. If you don't receive a letter, please call our office to schedule the follow-up appointment.   Any Other Special Instructions Will Be Listed Below (If Applicable).     If you need a refill on your cardiac medications before your next appointment, please call your pharmacy.

## 2016-12-06 ENCOUNTER — Other Ambulatory Visit: Payer: BLUE CROSS/BLUE SHIELD | Admitting: *Deleted

## 2016-12-06 DIAGNOSIS — E785 Hyperlipidemia, unspecified: Secondary | ICD-10-CM | POA: Diagnosis not present

## 2016-12-06 DIAGNOSIS — I10 Essential (primary) hypertension: Secondary | ICD-10-CM

## 2016-12-06 LAB — COMPREHENSIVE METABOLIC PANEL
ALT: 11 IU/L (ref 0–44)
AST: 12 IU/L (ref 0–40)
Albumin/Globulin Ratio: 1.8 (ref 1.2–2.2)
Albumin: 4.2 g/dL (ref 3.5–5.5)
Alkaline Phosphatase: 75 IU/L (ref 39–117)
BUN/Creatinine Ratio: 18 (ref 9–20)
BUN: 17 mg/dL (ref 6–24)
Bilirubin Total: 0.6 mg/dL (ref 0.0–1.2)
CO2: 22 mmol/L (ref 18–29)
Calcium: 8.9 mg/dL (ref 8.7–10.2)
Chloride: 106 mmol/L (ref 96–106)
Creatinine, Ser: 0.96 mg/dL (ref 0.76–1.27)
GFR calc Af Amer: 100 mL/min/{1.73_m2} (ref >59)
GFR calc non Af Amer: 86 mL/min/{1.73_m2} (ref >59)
Globulin, Total: 2.3 g/dL (ref 1.5–4.5)
Glucose: 109 mg/dL — ABNORMAL HIGH (ref 65–99)
Potassium: 4.3 mmol/L (ref 3.5–5.2)
Sodium: 142 mmol/L (ref 134–144)
Total Protein: 6.5 g/dL (ref 6.0–8.5)

## 2016-12-06 LAB — HEPATIC FUNCTION PANEL: Bilirubin, Direct: 0.15 mg/dL (ref 0.00–0.40)

## 2016-12-16 ENCOUNTER — Other Ambulatory Visit: Payer: Self-pay | Admitting: Internal Medicine

## 2016-12-17 ENCOUNTER — Telehealth: Payer: Self-pay

## 2016-12-19 ENCOUNTER — Telehealth: Payer: Self-pay | Admitting: *Deleted

## 2016-12-19 ENCOUNTER — Telehealth: Payer: Self-pay | Admitting: Interventional Cardiology

## 2016-12-19 NOTE — Telephone Encounter (Signed)
[facility]ym[facility]

## 2016-12-19 NOTE — Telephone Encounter (Signed)
Follow Up:; ° ° °Returning your call. °

## 2016-12-19 NOTE — Telephone Encounter (Signed)
New message ° ° ° °Pt is returning call to Jennifer about labs.  °

## 2016-12-19 NOTE — Telephone Encounter (Signed)
Left detailed message on confirmed VM.  Advised to call back with any questions

## 2016-12-19 NOTE — Telephone Encounter (Signed)
Spoke with pt and made him aware lab results were normal.  Pt verbalized understanding.

## 2016-12-20 ENCOUNTER — Telehealth: Payer: Self-pay | Admitting: Internal Medicine

## 2016-12-20 NOTE — Telephone Encounter (Signed)
Refill department notified me yesterday that they had spoken with the patient- he reports that entresto is costing him about $400/ month- he has a $6500 deductible. I was able to speak with [name] Walker, Pharm D today- she advised that they patient can go on-line or call to activate the [facility]. With his high deductible this may not bring it down to $10, but whatever entresto pays will apply toward his deductible along with whatever he is paying- he will meet his deductible quicker.  Once he goes on-line/ calls to activate the co-pay card, then the pharmacy can do a test run to see what his part would be as far as cost.    I left a message for the patient on his cell # to call to discuss.

## 2016-12-26 NOTE — Telephone Encounter (Signed)
PA for ENTRESTO approved through covermymeds. 

## 2017-01-20 ENCOUNTER — Other Ambulatory Visit: Payer: Self-pay | Admitting: Interventional Cardiology

## 2017-02-26 ENCOUNTER — Other Ambulatory Visit: Payer: Self-pay | Admitting: Interventional Cardiology

## 2017-03-24 ENCOUNTER — Encounter: Payer: Self-pay | Admitting: *Deleted

## 2017-03-24 NOTE — Pre-Procedure Instructions (Signed)
HISTORY REVIEWED WITH DR Priscella Mann AND OK TO PROCEED

## 2017-03-24 NOTE — Patient Instructions (Signed)
  Your procedure is scheduled on: 03/25/17 3:OO PM Report to Day Surgery. MEDICAL MALL SECOND FLOOR  Remember: Instructions that are not followed completely may result in serious medical risk, up to and including death, or upon the discretion of your surgeon and anesthesiologist your surgery may need to be rescheduled.    X____ 1. Do not eat food after midnight the night before your procedure. No gum chewing or hard candies. You may drink clear liquids up to 2 hours before you are scheduled to arrive for your surgery- DO not drink clear liquids within 2 hours of the start of your surgery.  Clear Liquids include: water, apple juice without pulp, clear carbohydrate drink such as Clearfast of Gartorade, Black Coffee or Tea (Do not add anything to coffee or tea).    _X___ 2. No Alcohol for 24 hours before or after surgery.   _X___ 3. Do Not Smoke For 24 Hours Prior to Your Surgery.   ____ 4. Bring all medications with you on the day of surgery if instructed.    _X___ 5. Notify your doctor if there is any change in your medical condition     (cold, fever, infections).       Do not wear jewelry, make-up, hairpins, clips or nail polish.  Do not wear lotions, powders, or perfumes. You may wear deodorant.  Do not shave 48 hours prior to surgery. Men may shave face and neck.  Do not bring valuables to the hospital.    [facility] is not responsible for any belongings or valuables.               Contacts, dentures or bridgework may not be worn into surgery.  Leave your suitcase in the car. After surgery it may be brought to your room.  For patients admitted to the hospital, discharge time is determined by your                treatment team.   Patients discharged the day of surgery will not be allowed to drive home.   _X___ Take these medicines the morning of surgery with A SIP OF WATER:    1.CARVEDILOL  2.   3.   4.  5.  6.  ____ Fleet Enema (as directed)   ____ Use  CHG Soap as directed  _X___ Use inhalers on the day of surgery  AND BRING  ____ Stop metformin 2 days prior to surgery    ____ Take 1/2 of usual insulin dose the night before surgery and none on the morning of surgery.   __X__ Stop Coumadin/Plavix/aspirin on    NO ASPIRIN TODAY  ____ Stop Anti-inflammatories on    ____ Stop supplements until after surgery.    ____ Bring C-Pap to the hospital.

## 2017-03-25 ENCOUNTER — Encounter: Payer: Self-pay | Admitting: *Deleted

## 2017-03-25 ENCOUNTER — Ambulatory Visit: Payer: BLUE CROSS/BLUE SHIELD | Admitting: Anesthesiology

## 2017-03-25 ENCOUNTER — Encounter
Admission: RE | Disposition: A | Discharge status: Home / Self Care | Payer: Self-pay | Source: Ambulatory Visit | Attending: Urology

## 2017-03-25 ENCOUNTER — Ambulatory Visit
Admission: RE | Admit: 2017-03-25 | Discharge: 2017-03-25 | Disposition: A | Discharge status: Home / Self Care | Payer: BLUE CROSS/BLUE SHIELD | Source: Ambulatory Visit | Attending: Urology | Admitting: Urology

## 2017-03-25 DIAGNOSIS — N529 Male erectile dysfunction, unspecified: Secondary | ICD-10-CM | POA: Insufficient documentation

## 2017-03-25 DIAGNOSIS — Z7982 Long term (current) use of aspirin: Secondary | ICD-10-CM | POA: Diagnosis not present

## 2017-03-25 DIAGNOSIS — Z955 Presence of coronary angioplasty implant and graft: Secondary | ICD-10-CM | POA: Diagnosis not present

## 2017-03-25 DIAGNOSIS — Z79899 Other long term (current) drug therapy: Secondary | ICD-10-CM | POA: Diagnosis not present

## 2017-03-25 DIAGNOSIS — I2510 Atherosclerotic heart disease of native coronary artery without angina pectoris: Secondary | ICD-10-CM | POA: Diagnosis not present

## 2017-03-25 DIAGNOSIS — N202 Calculus of kidney with calculus of ureter: Secondary | ICD-10-CM | POA: Diagnosis present

## 2017-03-25 DIAGNOSIS — F17210 Nicotine dependence, cigarettes, uncomplicated: Secondary | ICD-10-CM | POA: Insufficient documentation

## 2017-03-25 HISTORY — PX: CYSTOSCOPY/RETROGRADE/URETEROSCOPY/STONE EXTRACTION WITH BASKET: SHX5317

## 2017-03-25 HISTORY — PX: CYSTOSCOPY WITH STENT PLACEMENT: SHX5790

## 2017-03-25 HISTORY — DX: Personal history of urinary calculi: Z87.442

## 2017-03-25 HISTORY — DX: Unspecified hearing loss, unspecified ear: H91.90

## 2017-03-25 HISTORY — DX: Bronchitis, not specified as acute or chronic: J40

## 2017-03-25 SURGERY — CYSTOSCOPY, WITH STENT INSERTION
Anesthesia: General | Site: Ureter | Laterality: Right | Wound class: Clean Contaminated

## 2017-03-25 MED ORDER — EPHEDRINE SULFATE 50 MG/ML IJ SOLN
20.0000 mg | Freq: Once | INTRAMUSCULAR | Status: AC
  Administered 2017-03-25: 20 mg via INTRAVENOUS

## 2017-03-25 MED ORDER — ONDANSETRON HCL 4 MG/2ML IJ SOLN
INTRAMUSCULAR | Status: DC | PRN
  Administered 2017-03-25: 4 mg via INTRAVENOUS

## 2017-03-25 MED ORDER — LIDOCAINE HCL (CARDIAC) 20 MG/ML IV SOLN
INTRAVENOUS | Status: DC | PRN
  Administered 2017-03-25: 20 mg via INTRAVENOUS

## 2017-03-25 MED ORDER — DEXAMETHASONE SODIUM PHOSPHATE 10 MG/ML IJ SOLN
INTRAMUSCULAR | Status: DC | PRN
Start: 2017-03-25 — End: 2017-03-25
  Administered 2017-03-25: 10 mg via INTRAVENOUS

## 2017-03-25 MED ORDER — FAMOTIDINE 20 MG PO TABS
20.0000 mg | ORAL_TABLET | Freq: Once | ORAL | Status: AC
  Administered 2017-03-25: 20 mg via ORAL

## 2017-03-25 MED ORDER — IOTHALAMATE MEGLUMINE 43 % IV SOLN
INTRAVENOUS | Status: DC | PRN
  Administered 2017-03-25: 15 mL via URETHRAL

## 2017-03-25 MED ORDER — LACTATED RINGERS IV SOLN
INTRAVENOUS | Status: DC
  Administered 2017-03-25: 75 mL/h via INTRAVENOUS

## 2017-03-25 MED ORDER — FENTANYL CITRATE (PF) 100 MCG/2ML IJ SOLN
INTRAMUSCULAR | Status: DC | PRN
  Administered 2017-03-25 (×2): 50 ug via INTRAVENOUS

## 2017-03-25 MED ORDER — SUCCINYLCHOLINE CHLORIDE 20 MG/ML IJ SOLN
INTRAMUSCULAR | Status: DC | PRN
  Administered 2017-03-25: 120 mg via INTRAVENOUS

## 2017-03-25 MED ORDER — ONDANSETRON 8 MG PO TBDP: 4.0000 mg | ORAL_TABLET | Freq: Four times a day (QID) | ORAL | 3 refills | Status: DC | PRN

## 2017-03-25 MED ORDER — CEFAZOLIN SODIUM-DEXTROSE 1-4 GM/50ML-% IV SOLN
1.0000 g | Freq: Once | INTRAVENOUS | Status: AC
  Administered 2017-03-25: 1 g via INTRAVENOUS

## 2017-03-25 MED ORDER — ONDANSETRON HCL 4 MG/2ML IJ SOLN
INTRAMUSCULAR | Status: AC
  Filled 2017-03-25: qty 2

## 2017-03-25 MED ORDER — OXYCODONE-ACETAMINOPHEN 10-325 MG PO TABS: 1.0000 | ORAL_TABLET | ORAL | 0 refills | Status: DC | PRN

## 2017-03-25 MED ORDER — MIDAZOLAM HCL 2 MG/2ML IJ SOLN
INTRAMUSCULAR | Status: AC
  Filled 2017-03-25: qty 2

## 2017-03-25 MED ORDER — HYOSCYAMINE SULFATE SL 0.125 MG SL SUBL: 0.1250 mg | SUBLINGUAL_TABLET | SUBLINGUAL | 3 refills | Status: DC | PRN

## 2017-03-25 MED ORDER — DEXAMETHASONE SODIUM PHOSPHATE 10 MG/ML IJ SOLN
INTRAMUSCULAR | Status: AC
  Filled 2017-03-25: qty 1

## 2017-03-25 MED ORDER — PROPOFOL 10 MG/ML IV BOLUS
INTRAVENOUS | Status: AC
  Filled 2017-03-25: qty 20

## 2017-03-25 MED ORDER — LIDOCAINE HCL (PF) 2 % IJ SOLN
INTRAMUSCULAR | Status: AC
  Filled 2017-03-25: qty 2

## 2017-03-25 MED ORDER — FENTANYL CITRATE (PF) 100 MCG/2ML IJ SOLN: 25.0000 ug | INTRAMUSCULAR | Status: DC | PRN

## 2017-03-25 MED ORDER — FAMOTIDINE 20 MG PO TABS
ORAL_TABLET | ORAL | Status: AC
  Filled 2017-03-25: qty 1

## 2017-03-25 MED ORDER — SODIUM CHLORIDE 0.9 % IV SOLN
INTRAVENOUS | Status: DC | PRN
  Administered 2017-03-25: 50 ug/min via INTRAVENOUS

## 2017-03-25 MED ORDER — LEVOFLOXACIN 500 MG PO TABS: 500.0000 mg | ORAL_TABLET | Freq: Every day | ORAL | 1 refills | Status: DC

## 2017-03-25 MED ORDER — ONDANSETRON HCL 4 MG/2ML IJ SOLN: 4.0000 mg | Freq: Once | INTRAMUSCULAR | Status: DC | PRN

## 2017-03-25 MED ORDER — PHENYLEPHRINE HCL 10 MG/ML IJ SOLN
INTRAMUSCULAR | Status: AC
  Filled 2017-03-25: qty 1

## 2017-03-25 MED ORDER — ROCURONIUM BROMIDE 100 MG/10ML IV SOLN
INTRAVENOUS | Status: DC | PRN
  Administered 2017-03-25: 10 mg via INTRAVENOUS
  Administered 2017-03-25: 5 mg via INTRAVENOUS
  Administered 2017-03-25: 10 mg via INTRAVENOUS

## 2017-03-25 MED ORDER — EPHEDRINE SULFATE 50 MG/ML IJ SOLN
INTRAMUSCULAR | Status: DC
Start: 2017-03-25 — End: 2017-03-25
  Filled 2017-03-25: qty 1

## 2017-03-25 MED ORDER — SUGAMMADEX SODIUM 200 MG/2ML IV SOLN
INTRAVENOUS | Status: AC
  Filled 2017-03-25: qty 2

## 2017-03-25 MED ORDER — SUGAMMADEX SODIUM 200 MG/2ML IV SOLN
INTRAVENOUS | Status: DC | PRN
  Administered 2017-03-25: 200 mg via INTRAVENOUS

## 2017-03-25 MED ORDER — KETOROLAC TROMETHAMINE 30 MG/ML IJ SOLN
INTRAMUSCULAR | Status: DC | PRN
  Administered 2017-03-25: 30 mg via INTRAVENOUS

## 2017-03-25 MED ORDER — LIDOCAINE HCL 2 % EX GEL
CUTANEOUS | Status: AC
  Filled 2017-03-25: qty 10

## 2017-03-25 MED ORDER — PHENYLEPHRINE HCL 10 MG/ML IJ SOLN
INTRAMUSCULAR | Status: DC | PRN
  Administered 2017-03-25: 150 ug via INTRAVENOUS
  Administered 2017-03-25 (×2): 200 ug via INTRAVENOUS

## 2017-03-25 MED ORDER — TAMSULOSIN HCL 0.4 MG PO CAPS: 0.4000 mg | ORAL_CAPSULE | Freq: Every day | ORAL | 11 refills | Status: DC

## 2017-03-25 MED ORDER — PROPOFOL 10 MG/ML IV BOLUS
INTRAVENOUS | Status: DC | PRN
  Administered 2017-03-25: 150 mg via INTRAVENOUS
  Administered 2017-03-25: 50 mg via INTRAVENOUS

## 2017-03-25 MED ORDER — KETOROLAC TROMETHAMINE 30 MG/ML IJ SOLN
INTRAMUSCULAR | Status: AC
  Filled 2017-03-25: qty 1

## 2017-03-25 MED ORDER — LIDOCAINE HCL 2 % EX GEL
CUTANEOUS | Status: DC | PRN
  Administered 2017-03-25: 1 via URETHRAL

## 2017-03-25 MED ORDER — MIDAZOLAM HCL 5 MG/5ML IJ SOLN
INTRAMUSCULAR | Status: DC | PRN
  Administered 2017-03-25: 2 mg via INTRAVENOUS

## 2017-03-25 MED ORDER — FENTANYL CITRATE (PF) 100 MCG/2ML IJ SOLN
INTRAMUSCULAR | Status: AC
  Filled 2017-03-25: qty 2

## 2017-03-25 SURGICAL SUPPLY — 23 items
BAG DRAIN CYSTO-URO LG1000N (MISCELLANEOUS) ×3 IMPLANT
BASKET ZERO TIP 1.9FR (BASKET) ×3 IMPLANT
CATH URETL 5X70 OPEN END (CATHETERS) ×3 IMPLANT
CNTNR SPEC 2.5X3XGRAD LEK (MISCELLANEOUS) ×2
CONRAY 43 FOR UROLOGY 50M (MISCELLANEOUS) ×3 IMPLANT
CONT SPEC 4OZ STER OR WHT (MISCELLANEOUS) ×1
CONTAINER SPEC 2.5X3XGRAD LEK (MISCELLANEOUS) ×2 IMPLANT
FIBER LASER 365 (Laser) IMPLANT
GLOVE BIO SURGEON STRL SZ7 (GLOVE) ×6 IMPLANT
GOWN STRL REUS W/ TWL LRG LVL4 (GOWN DISPOSABLE) ×2 IMPLANT
GOWN STRL REUS W/TWL LRG LVL4 (GOWN DISPOSABLE) ×1
GOWN STRL REUS W/TWL XL LVL4 (GOWN DISPOSABLE) ×3 IMPLANT
GUIDEWIRE STR ZIPWIRE 035X150 (MISCELLANEOUS) ×3 IMPLANT
KIT RM TURNOVER CYSTO AR (KITS) ×3 IMPLANT
PACK CYSTO AR (MISCELLANEOUS) ×3 IMPLANT
SET CYSTO W/LG BORE CLAMP LF (SET/KITS/TRAYS/PACK) ×3 IMPLANT
SOL .9 NS 3000ML IRR  AL (IV SOLUTION) ×1
SOL .9 NS 3000ML IRR UROMATIC (IV SOLUTION) ×2 IMPLANT
SOL PREP PVP 2OZ (MISCELLANEOUS) ×3
SOLUTION PREP PVP 2OZ (MISCELLANEOUS) ×2 IMPLANT
STENT URET 6FRX26 CONTOUR (STENTS) ×3 IMPLANT
SURGILUBE 2OZ TUBE FLIPTOP (MISCELLANEOUS) ×3 IMPLANT
WATER STERILE IRR 1000ML POUR (IV SOLUTION) ×3 IMPLANT

## 2017-03-25 NOTE — H&P (Signed)
[name] Walker, [name] Walker NO.:  [id number]  MEDICAL RECORD NO.:  [id number]  LOCATION:                                 FACILITY:  PHYSICIAN:  Anola Gurney          DATE OF BIRTH:  03/21/1956  DATE OF ADMISSION:  03/25/2017 DATE OF DISCHARGE:                            HISTORY AND PHYSICAL   SAME-DAY SURGERY:  March 26, 2017.  CHIEF COMPLAINT:  Right flank pain.  HISTORY OF PRESENT ILLNESS:  [name] Walker is a 61 year old white male, who presented to the office on September 24th with a 3-day history of right flank pain radiating anteriorly.  He was found to have multiple small stones in the distal ureter measuring in aggregate 4 x 7 mm.  He was also found to have 4 large stones in the right renal pelvis.  He comes in now for cystoscopy with right ureteroscopic ureterolithotomy and stent placement.  ALLERGIES:  NO DRUG ALLERGIES.  CURRENT MEDICATIONS:  Atorvastatin, carvedilol, Entresto, aspirin, albuterol, and tamsulosin.  SURGICAL HISTORY:  Lithotripsy 2016, ureteral stent placement 2016, and coronary artery stent placement 2013.  SOCIAL HISTORY:  The patient smokes a pack a day and has a greater than 40-pack year history.  He denies alcohol use.  FAMILY HISTORY:  Father died of coronary artery disease.  Mother is living age 60.  PAST AND CURRENT MEDICAL CONDITIONS: 1. Coronary artery disease. 2. Kidney stone disease. 3. Erectile dysfunction.  REVIEW OF SYSTEMS:  The patient denied chest pain, shortness of breath, diabetes, or stroke.  PHYSICAL EXAMINATION:  GENERAL:  Well-nourished white male in no distress. HEENT:  Sclerae were clear. NECK:  Supple.  No palpable cervical adenopathy. LUNGS:  Clear to auscultation. CARDIOVASCULAR:  Regular rhythm and rate, without audible murmurs. ABDOMINAL:  Soft, nontender abdomen. GU AND RECTAL:  Deferred. NEUROMUSCULAR:  Alert and oriented x3.  IMPRESSION: 1. Right urolithiasis. 2. Right  nephrolithiasis.  PLAN:  Right ureteroscopic ureterolithotomy with holmium laser lithotripsy and stent placement.    ______________________________ Anola Gurney   ______________________________ Anola Gurney    MW/MEDQ  D:  03/24/2017  T:  03/24/2017  Job:  [id number]

## 2017-03-25 NOTE — Anesthesia Procedure Notes (Signed)
Procedure Name: Intubation Date/Time: 03/25/2017 4:40 PM Performed by: Dionne Bucy Pre-anesthesia Checklist: Patient identified, Patient being monitored, Timeout performed, Emergency Drugs available and Suction available Patient Re-evaluated:Patient Re-evaluated prior to induction Oxygen Delivery Method: Circle system utilized Preoxygenation: Pre-oxygenation with 100% oxygen Induction Type: IV induction Ventilation: Mask ventilation without difficulty Laryngoscope Size: McGraph and 4 Grade View: Grade I Tube type: Oral Tube size: 7.5 mm Number of attempts: 1 Airway Equipment and Method: Stylet and Video-laryngoscopy Placement Confirmation: ETT inserted through vocal cords under direct vision,  positive ETCO2 and breath sounds checked- equal and bilateral Secured at: 22 cm Tube secured with: Tape Dental Injury: Teeth and Oropharynx as per pre-operative assessment  Comments: DLx1 with MAC 3 grade 3 view; changed to MacGrath 4 with grade 2 view; +ETCO2 +=BBS; pt stable throughout induction

## 2017-03-25 NOTE — Anesthesia Preprocedure Evaluation (Signed)
Anesthesia Evaluation  Patient identified by MRN, date of birth, ID band Patient awake    Reviewed: Allergy & Precautions, H&P , NPO status , Patient's Chart, lab work & pertinent test results  History of Anesthesia Complications Negative for: history of anesthetic complications  Airway Mallampati: III  TM Distance: >3 FB Neck ROM: full    Dental  (+) Poor Dentition   Pulmonary sleep apnea , Current Smoker,    Pulmonary exam normal breath sounds clear to auscultation       Cardiovascular Exercise Tolerance: Good hypertension, (-) angina+ CAD, + Past MI, + Cardiac Stents and +CHF  Normal cardiovascular exam Rhythm:regular Rate:Normal     Neuro/Psych negative neurological ROS  negative psych ROS   GI/Hepatic negative GI ROS, Neg liver ROS,   Endo/Other  negative endocrine ROS  Renal/GU Renal diseasenegative Renal ROS  negative genitourinary   Musculoskeletal   Abdominal   Peds  Hematology negative hematology ROS (+)   Anesthesia Other Findings Past Medical History:   Varicose veins                                               No pertinent past medical history                            Hypertension                                                 Chronic kidney disease                                         Comment:stone   Sleep apnea                                                  Sleep apnea                                                    Comment:to have sleep study   Coronary artery disease                                      MI (myocardial infarction)                      06/19/2012     Comment:being considered for defibilator   Reproductive/Obstetrics negative OB ROS                             Anesthesia Physical  Anesthesia Plan  ASA: IV  Anesthesia Plan:    Post-op Pain Management:    Induction:   PONV Risk Score and Plan:   Airway Management Planned: Oral  ETT  Additional Equipment:   Intra-op Plan:   Post-operative Plan: Extubation in OR  Informed Consent: I have reviewed the patients History and Physical, chart, labs and discussed the procedure including the risks, benefits and alternatives for the proposed anesthesia with the patient or authorized representative who has indicated his/her understanding and acceptance.   Dental Advisory Given and Dental advisory given  Plan Discussed with: Anesthesiologist, CRNA and Surgeon  Anesthesia Plan Comments: (Patient informed that he is higher risk for medical compliacionts during this procedure due to his medical history.  Patient voiced understanding. )        Anesthesia Quick Evaluation

## 2017-03-25 NOTE — Op Note (Signed)
Preoperative diagnosis: 1. Right ureterolithiasis with hydronephrosis                                             2. Right nephrolithiasis  Postoperative diagnosis: Same   Procedure: 1. Right ureteroscopic ureterolithotomy with basket stone extraction                      2. Right retrograde pyelogram                      3. Right double pigtail ureteral stent placement                      4. Fluoroscopy    Surgeon: Suszanne Conners. Evelene Croon MD  Anesthesia: General  Indications:See the history and physical. After informed consent the above procedure(s) were requested     Technique and findings: After adequate general anesthesia been obtained patient was placed into dorsal lithotomy position and the perineum was prepped and draped in the usual fashion. Fluoroscopy revealed numerous 1-4 mm stones in the distal right ureter measuring in aggregate length 4 cm. The patient also had 4 large right renal stones present.  The short mini ureteroscope was then coupled to the camera and advance into the right ureteral orifice. The initial stone was encountered and measured 4 mm in size. The 0 tip nitinol basket was then used to remove the stone. 3 other stones measuring 3 or 4 mm in size were also extracted in similar fashion. The remaining stones were less than 1 mm size and were flushed out of the ureter. At this point the ureteroscope was advanced to mid ureter and retrograde pyelogram was performed. Patient had moderate hydronephrosis and 4 stones in the renal pelvis were confirmed. A 0.035 Glidewire was passed through the ureteroscope and curled into the renal pelvis. The ureteroscope was then removed taking care leaving the guidewire in position. The cystoscope was backloaded over the guidewire and a 6 x 26 cm double pigtail ureteral stent was advanced over the guidewire and position in the ureter. The guidewire was then removed taking care leaving the stent in position. The bladder was drained and cystoscope was  removed. 10 cc of viscous Xylocaine was instilled within the urethra. The procedure was then terminated and patient transferred to the recovery room in stable condition.

## 2017-03-25 NOTE — Anesthesia Postprocedure Evaluation (Signed)
Anesthesia Post Note  Patient: [name] Walker  Procedure(s) Performed: Procedure(s) (LRB): CYSTOSCOPY WITH STENT PLACEMENT (Right) CYSTOSCOPY/RETROGRADE/URETEROSCOPY/STONE EXTRACTION WITH BASKET (Right)  Patient location during evaluation: PACU Anesthesia Type: General Level of consciousness: awake and alert and oriented Pain management: pain level controlled Vital Signs Assessment: post-procedure vital signs reviewed and stable Respiratory status: spontaneous breathing Cardiovascular status: blood pressure returned to baseline Anesthetic complications: no     Last Vitals:  Vitals:   03/25/17 1834 03/25/17 1902  BP: 104/83 (!) 90/55  Pulse: 69 66  Resp: 20 18  Temp: (!) 36.3 C   SpO2: 100% 99%    Last Pain:  Vitals:   03/25/17 1902  TempSrc:   PainSc: 0-No pain                 Delfin Squillace

## 2017-03-25 NOTE — Discharge Instructions (Addendum)
AMBULATORY SURGERY  DISCHARGE INSTRUCTIONS   1) The drugs that you were given will stay in your system until tomorrow so for the next 24 hours you should not:  A) Drive an automobile B) Make any legal decisions C) Drink any alcoholic beverage   2) You may resume regular meals tomorrow.  Today it is better to start with liquids and gradually work up to solid foods.  You may eat anything you prefer, but it is better to start with liquids, then soup and crackers, and gradually work up to solid foods.   3) Please notify your doctor immediately if you have any unusual bleeding, trouble breathing, redness and pain at the surgery site, drainage, fever, or pain not relieved by medication.    4) Additional Instructions:        Please contact your physician with any problems or Same Day Surgery at [phone], Monday through Friday 6 am to 4 pm, or [place] at [facility] number at [phone].Ureteral Stent Implantation, Care After Refer to this sheet in the next few weeks. These instructions provide you with information about caring for yourself after your procedure. Your health care provider may also give you more specific instructions. Your treatment has been planned according to current medical practices, but problems sometimes occur. Call your health care provider if you have any problems or questions after your procedure. What can I expect after the procedure? After the procedure, it is common to have:  Nausea.  Mild pain when you urinate. You may feel this pain in your lower back or lower abdomen. Pain should stop within a few minutes after you urinate. This may last for up to 1 week.  A small amount of blood in your urine for several days.  Follow these instructions at home:  Medicines  Take over-the-counter and prescription medicines only as told by your health care provider.  If you were prescribed an antibiotic medicine, take it as told by your health care  provider. Do not stop taking the antibiotic even if you start to feel better.  Do not drive for 24 hours if you received a sedative.  Do not drive or operate heavy machinery while taking prescription pain medicines. Activity  Return to your normal activities as told by your health care provider. Ask your health care provider what activities are safe for you.  Do not lift anything that is heavier than 10 lb (4.5 kg). Follow this limit for 1 week after your procedure, or for as long as told by your health care provider. General instructions  Watch for any blood in your urine. Call your health care provider if the amount of blood in your urine increases.  If you have a catheter: ? Follow instructions from your health care provider about taking care of your catheter and collection bag. ? Do not take baths, swim, or use a hot tub until your health care provider approves.  Drink enough fluid to keep your urine clear or pale yellow.  Keep all follow-up visits as told by your health care provider. This is important. Contact a health care provider if:  You have pain that gets worse or does not get better with medicine, especially pain when you urinate.  You have difficulty urinating.  You feel nauseous or you vomit repeatedly during a period of more than 2 days after the procedure. Get help right away if:  Your urine is dark red or has blood clots in it.  You are leaking urine (have  incontinence).  The end of the stent comes out of your urethra.  You cannot urinate.  You have sudden, sharp, or severe pain in your abdomen or lower back.  You have a fever. This information is not intended to replace advice given to you by your health care provider. Make sure you discuss any questions you have with your health care provider. Document Released: 02/17/2013 Document Revised: 11/23/2015 Document Reviewed: 12/30/2014 Elsevier Interactive Patient Education  2018 [employer] Ureteral  Stent Implantation Ureteral stent implantation is a procedure to insert (implant) a flexible, soft, plastic tube (stent) into a tube (ureter) that drains urine from the kidneys. The stent supports the ureter while it heals and helps to drain urine from the kidneys. You may have a ureteral stent implanted after having a procedure to remove a blockage from the ureter (ureterolysis or pyeloplasty).You may also have a stent implanted to open the flow of urine when you have a blockage caused by a kidney stone, tumor, blood clot, or infection. You have two ureters, one on each side of the body. The ureters connect the kidneys to the organ that holds urine until it passes out of the body (bladder). The stent is placed so that one end is in the kidney, and one end is in the bladder. The stent is usually taken out after your ureter has healed. Depending on your condition, you may have a stent for just a few weeks, or you may have a long-term stent that will need to be replaced every few months. Tell a health care provider about:  Any allergies you have.  All medicines you are taking, including vitamins, herbs, eye drops, creams, and over-the-counter medicines.  Any problems you or family members have had with anesthetic medicines.  Any blood disorders you have.  Any surgeries you have had.  Any medical conditions you have.  Whether you are pregnant or may be pregnant. What are the risks? Generally, this is a safe procedure. However, problems may occur, including:  Infection.  Bleeding.  Allergic reactions to medicines.  Damage to other structures or organs. Tearing (perforation) of the ureter is possible.  Movement of the stent away from where it is placed during surgery (migration).  What happens before the procedure?  Ask your health care provider about: ? Changing or stopping your regular medicines. This is especially important if you are taking diabetes medicines or blood  thinners. ? Taking medicines such as aspirin and ibuprofen. These medicines can thin your blood. Do not take these medicines before your procedure if your health care provider instructs you not to.  Follow instructions from your health care provider about eating or drinking restrictions.  Do not drink alcohol and do not use any tobacco products before your procedure, as told by your health care provider.  You may be given antibiotic medicine to help prevent infection.  Plan to have someone take you home after the procedure.  If you go home right after the procedure, plan to have someone with you for 24 hours. What happens during the procedure?  An IV tube will be inserted into one of your veins.  You will be given a medicine to make you fall asleep (general anesthetic). You may also be given a medicine to help you relax (sedative).  A thin, tube-shaped instrument with a light and tiny camera at the end (cystoscope) will be inserted into your urethra. The urethra is the tube that drains urine from the bladder out of the body.  In men, the urethra opens at the end of the penis. In women, the urethra opens in front of the vaginal opening.  The cystoscope will be passed into your bladder.  A thin wire (guide wire) will be passed through your bladder and into your ureter. This is used to guide the stent into your ureter.  The stent will be inserted into your ureter.  The guide wire and the cystoscope will be removed.  A flexible tube (catheter) will be inserted through your urethra so that one end is in your bladder. This helps to drain urine from your bladder. The procedure may vary among hospitals and health care providers. What happens after the procedure?  Your blood pressure, heart rate, breathing rate, and blood oxygen level will be monitored often until the medicines you were given have worn off.  You may continue to receive medicine and fluids through an IV tube.  You may have  some soreness or pain in your abdomen and urethra. Medicines will be available to help you.  You will be encouraged to get up and walk around as soon as you can.  You will have a catheter draining your urine.  You will have some blood in your urine.  Do not drive for 24 hours if you received a sedative. This information is not intended to replace advice given to you by your health care provider. Make sure you discuss any questions you have with your health care provider. Document Released: 06/14/2000 Document Revised: 11/23/2015 Document Reviewed: 12/30/2014 Elsevier Interactive Patient Education  2018 Elsevier Inc.  AMBULATORY SURGERY  DISCHARGE INSTRUCTIONS   5) The drugs that you were given will stay in your system until tomorrow so for the next 24 hours you should not:  D) Drive an automobile E) Make any legal decisions F) Drink any alcoholic beverage   6) You may resume regular meals tomorrow.  Today it is better to start with liquids and gradually work up to solid foods.  You may eat anything you prefer, but it is better to start with liquids, then soup and crackers, and gradually work up to solid foods.   7) Please notify your doctor immediately if you have any unusual bleeding, trouble breathing, redness and pain at the surgery site, drainage, fever, or pain not relieved by medication.    8) Additional Instructions:        Please contact your physician with any problems or Same Day Surgery at [phone], Monday through Friday 6 am to 4 pm, or [place] at [facility] number at [phone].

## 2017-03-25 NOTE — Transfer of Care (Signed)
Immediate Anesthesia Transfer of Care Note  Patient: [name] Walker  Procedure(s) Performed: Procedure(s): CYSTOSCOPY WITH STENT PLACEMENT (Right) CYSTOSCOPY/RETROGRADE/URETEROSCOPY/STONE EXTRACTION WITH BASKET (Right)  Patient Location: PACU  Anesthesia Type:General  Level of Consciousness: sedated  Airway & Oxygen Therapy: Patient Spontanous Breathing and Patient connected to face mask oxygen  Post-op Assessment: Report given to RN and Post -op Vital signs reviewed and stable  Post vital signs: Reviewed and stable  Last Vitals:  Vitals:   03/25/17 1526 03/25/17 1736  BP: 107/72 (!) 86/66  Pulse: 70 66  Resp: 15 (!) 6  Temp: 36.9 C (!) 36.2 C  SpO2: 100% 100%    Last Pain:  Vitals:   03/25/17 1526  TempSrc: Tympanic  PainSc: 4       Patients Stated Pain Goal: 1 (03/25/17 1526)  Complications: No apparent anesthesia complications

## 2017-03-25 NOTE — Anesthesia Post-op Follow-up Note (Signed)
Anesthesia QCDR form completed.        

## 2017-03-26 ENCOUNTER — Encounter: Payer: Self-pay | Admitting: Urology

## 2017-04-01 ENCOUNTER — Encounter
Admission: RE | Admit: 2017-04-01 | Discharge: 2017-04-01 | Disposition: A | Discharge status: Home / Self Care | Payer: BLUE CROSS/BLUE SHIELD | Source: Ambulatory Visit | Attending: Urology | Admitting: Urology

## 2017-04-01 DIAGNOSIS — I2510 Atherosclerotic heart disease of native coronary artery without angina pectoris: Secondary | ICD-10-CM | POA: Diagnosis not present

## 2017-04-01 DIAGNOSIS — I499 Cardiac arrhythmia, unspecified: Secondary | ICD-10-CM

## 2017-04-01 DIAGNOSIS — R001 Bradycardia, unspecified: Secondary | ICD-10-CM | POA: Insufficient documentation

## 2017-04-01 DIAGNOSIS — N200 Calculus of kidney: Secondary | ICD-10-CM | POA: Diagnosis not present

## 2017-04-01 HISTORY — DX: Cardiac arrhythmia, unspecified: I49.9

## 2017-04-02 MED ORDER — LEVOFLOXACIN 500 MG PO TABS: 500.0000 mg | ORAL_TABLET | ORAL | Status: DC

## 2017-04-03 ENCOUNTER — Ambulatory Visit: Admission: RE | Admit: 2017-04-03 | Payer: BLUE CROSS/BLUE SHIELD | Source: Ambulatory Visit | Admitting: Urology

## 2017-04-03 ENCOUNTER — Telehealth: Payer: Self-pay | Admitting: Interventional Cardiology

## 2017-04-03 HISTORY — DX: Male erectile dysfunction, unspecified: N52.9

## 2017-04-03 HISTORY — DX: Pure hypercholesterolemia, unspecified: E78.00

## 2017-04-03 SURGERY — LITHOTRIPSY, ESWL
Anesthesia: Moderate Sedation | Laterality: Right

## 2017-04-03 NOTE — Telephone Encounter (Signed)
New message    Pt is calling stating that his kidney doctor is going to be calling because he was supposed to have a procedure done today and they need a release.

## 2017-04-03 NOTE — Pre-Procedure Instructions (Signed)
Received "confirmed" copy of EKG performed on 04/01/2017 which showed changes in new EKG compared to previous ones. After reviewing previous cardiology notes regarding patients' EF fraction being low, the need for an AICD, chronic heart failure and the new changes, [facility] said that they would not be able to perform the Extracorporeal Shockwave Lithotripsy. After the patient receives cardiac clearance, [facility] wants this procedure to be booked under MAC.  Call to Dr. Fabio Asa' office to notify him of this information and that patient is cancelled for today.  Placed a call to patient and explained all of this to him.  He is aware that he needs a cardiac clearance from Dr. Daneen Schick and it needs to be sent to Dr. Yves Dill.  Once clearance is obtained, then Dr. Yves Dill would be able to reschedule the procedure.  Patient understood information and will proceed by contacting his cardiologist.

## 2017-04-03 NOTE — Telephone Encounter (Signed)
Noted- no call or paperwork received as of now.

## 2017-04-04 ENCOUNTER — Telehealth: Payer: Self-pay | Admitting: *Deleted

## 2017-04-04 NOTE — Telephone Encounter (Signed)
I called patient but was unable to speak to him. I was able to leave a message. Advised him that we would call him if we did not hear from him by Monday afternoon.  Pt seen by Dr Katrinka Blazing 10/2016 Hx CAD, ICM, EF 35% Risk calculator score 2= 6.6%  Per protocol, phone call needed.  Theodore Demark, Cordelia Poche 04/04/2017 4:43 PM Beeper [phone]

## 2017-04-04 NOTE — Telephone Encounter (Signed)
Pre op clearance in process.  See telephone note dated 04/04/17

## 2017-04-04 NOTE — Telephone Encounter (Signed)
   [facility] Pre-operative Risk Assessment    Request for surgical clearance:  1. What type of surgery is being performed? Lithotripsy   2. When is this surgery scheduled? No appointment right now.  3. Are there any medications that need to be held prior to surgery and how long? None right now.  4. Practice name and name of physician performing surgery? Dr. Otelia Limes. Wolff   5. What is your office phone and fax number? Phone [phone] Fax [phone]  6. Anesthesia type (None, local, MAC, general) ? None right now   [place], [place] 04/04/2017, 8:56 AM  _________________________________________________________________   (provider comments below)

## 2017-04-07 NOTE — Telephone Encounter (Signed)
Follow up     Pt is returning call from Friday about clearance.

## 2017-04-07 NOTE — Telephone Encounter (Signed)
To Pre Op In Basket 

## 2017-04-08 NOTE — Telephone Encounter (Signed)
Follow up    Fax [phone] -needs the surgical clearance faxed back asap so they can get pt scheduled for Thursday

## 2017-04-08 NOTE — Telephone Encounter (Signed)
Fax would not go through to number below.  I called office and alternate fax number is [phone].  Note faxed to this number. Confirmation received.

## 2017-04-08 NOTE — Telephone Encounter (Signed)
Pt with Risk calculator score of 2 = 6.6% but Duke activity index is 50.7 - low risk for moderate rick procedure.      Chart reviewed as part of pre-operative protocol coverage. Given past medical history and time since last visit, based on ACC/AHA guidelines, TRAVONTA GILL would be at acceptable risk for the planned procedure without further cardiovascular testing.   Nada Boozer, NP 04/08/2017, 3:10 PM

## 2017-04-09 ENCOUNTER — Encounter: Payer: Self-pay | Admitting: *Deleted

## 2017-04-10 ENCOUNTER — Ambulatory Visit
Admission: RE | Admit: 2017-04-10 | Discharge: 2017-04-10 | Disposition: A | Discharge status: Home / Self Care | Payer: BLUE CROSS/BLUE SHIELD | Source: Ambulatory Visit | Attending: Urology | Admitting: Urology

## 2017-04-10 ENCOUNTER — Encounter: Payer: Self-pay | Admitting: Urology

## 2017-04-10 ENCOUNTER — Encounter
Admission: RE | Disposition: A | Discharge status: Home / Self Care | Payer: Self-pay | Source: Ambulatory Visit | Attending: Urology

## 2017-04-10 DIAGNOSIS — N200 Calculus of kidney: Secondary | ICD-10-CM | POA: Diagnosis present

## 2017-04-10 HISTORY — DX: Cardiac arrhythmia, unspecified: I49.9

## 2017-04-10 HISTORY — PX: EXTRACORPOREAL SHOCK WAVE LITHOTRIPSY: SHX1557

## 2017-04-10 SURGERY — LITHOTRIPSY, ESWL
Anesthesia: Moderate Sedation | Laterality: Right

## 2017-04-10 MED ORDER — DIPHENHYDRAMINE HCL 25 MG PO CAPS
ORAL_CAPSULE | ORAL | Status: AC
  Administered 2017-04-10: 25 mg via ORAL
  Filled 2017-04-10: qty 1

## 2017-04-10 MED ORDER — FUROSEMIDE 10 MG/ML IJ SOLN
INTRAMUSCULAR | Status: AC
  Filled 2017-04-10: qty 2

## 2017-04-10 MED ORDER — PROMETHAZINE HCL 25 MG/ML IJ SOLN
25.0000 mg | Freq: Once | INTRAMUSCULAR | Status: AC
  Administered 2017-04-10: 25 mg via INTRAMUSCULAR

## 2017-04-10 MED ORDER — LEVOFLOXACIN 500 MG PO TABS
500.0000 mg | ORAL_TABLET | ORAL | Status: AC
  Administered 2017-04-10: 500 mg via ORAL

## 2017-04-10 MED ORDER — MORPHINE SULFATE (PF) 10 MG/ML IV SOLN
INTRAVENOUS | Status: AC
  Administered 2017-04-10: 10 mg via INTRAMUSCULAR
  Filled 2017-04-10: qty 1

## 2017-04-10 MED ORDER — MIDAZOLAM HCL 2 MG/2ML IJ SOLN
1.0000 mg | Freq: Once | INTRAMUSCULAR | Status: AC
  Administered 2017-04-10: 1 mg via INTRAMUSCULAR

## 2017-04-10 MED ORDER — FUROSEMIDE 10 MG/ML IJ SOLN
10.0000 mg | Freq: Once | INTRAMUSCULAR | Status: AC
  Administered 2017-04-10: 10 mg via INTRAVENOUS

## 2017-04-10 MED ORDER — MORPHINE SULFATE (PF) 10 MG/ML IV SOLN
10.0000 mg | Freq: Once | INTRAVENOUS | Status: AC
  Administered 2017-04-10: 10 mg via INTRAMUSCULAR

## 2017-04-10 MED ORDER — ONDANSETRON HCL 4 MG/2ML IJ SOLN: 4.0000 mg | INTRAMUSCULAR | Status: DC | PRN

## 2017-04-10 MED ORDER — DIPHENHYDRAMINE HCL 25 MG PO CAPS
25.0000 mg | ORAL_CAPSULE | ORAL | Status: AC
  Administered 2017-04-10: 25 mg via ORAL

## 2017-04-10 MED ORDER — DEXTROSE-NACL 5-0.45 % IV SOLN
INTRAVENOUS | Status: DC
  Administered 2017-04-10: 11:00:00 via INTRAVENOUS

## 2017-04-10 MED ORDER — LEVOFLOXACIN 500 MG PO TABS
ORAL_TABLET | ORAL | Status: AC
Start: 2017-04-10 — End: 2017-04-10
  Administered 2017-04-10: 500 mg via ORAL
  Filled 2017-04-10: qty 1

## 2017-04-10 MED ORDER — MIDAZOLAM HCL 2 MG/2ML IJ SOLN
INTRAMUSCULAR | Status: AC
  Administered 2017-04-10: 1 mg via INTRAMUSCULAR
  Filled 2017-04-10: qty 2

## 2017-04-10 MED ORDER — PROMETHAZINE HCL 25 MG/ML IJ SOLN
INTRAMUSCULAR | Status: AC
  Administered 2017-04-10: 25 mg via INTRAMUSCULAR
  Filled 2017-04-10: qty 1

## 2017-04-10 NOTE — Discharge Instructions (Addendum)
Lithotripsy, Care After °This sheet gives you information about how to care for yourself after your procedure. Your health care provider may also give you more specific instructions. If you have problems or questions, contact your health care provider. °What can I expect after the procedure? °After the procedure, it is common to have: °· Some blood in your urine. This should only last for a few days. °· Soreness in your back, sides, or upper abdomen for a few days. °· Blotches or bruises on your back where the pressure wave entered the skin. °· Pain, discomfort, or nausea when pieces (fragments) of the kidney stone move through the tube that carries urine from the kidney to the bladder (ureter). Stone fragments may pass soon after the procedure, but they may continue to pass for up to 4-8 weeks. °? If you have severe pain or nausea, contact your health care provider. This may be caused by a large stone that was not broken up, and this may mean that you need more treatment. °· Some pain or discomfort during urination. °· Some pain or discomfort in the lower abdomen or (in men) at the base of the penis. ° °Follow these instructions at home: °Medicines °· Take over-the-counter and prescription medicines only as told by your health care provider. °· If you were prescribed an antibiotic medicine, take it as told by your health care provider. Do not stop taking the antibiotic even if you start to feel better. °· Do not drive for 24 hours if you were given a medicine to help you relax (sedative). °· Do not drive or use heavy machinery while taking prescription pain medicine. °Eating and drinking °· Drink enough water and fluids to keep your urine clear or pale yellow. This helps any remaining pieces of the stone to pass. It can also help prevent new stones from forming. °· Eat plenty of fresh fruits and vegetables. °· Follow instructions from your health care provider about eating and drinking restrictions. You may be  instructed: °? To reduce how much salt (sodium) you eat or drink. Check ingredients and nutrition facts on packaged foods and beverages. °? To reduce how much meat you eat. °· Eat the recommended amount of calcium for your age and gender. Ask your health care provider how much calcium you should have. °General instructions °· Get plenty of rest. °· Most people can resume normal activities 1-2 days after the procedure. Ask your health care provider what activities are safe for you. °· If directed, strain all urine through the strainer that was provided by your health care provider. °? Keep all fragments for your health care provider to see. Any stones that are found may be sent to a medical lab for examination. The stone may be as small as a grain of salt. °· Keep all follow-up visits as told by your health care provider. This is important. °Contact a health care provider if: °· You have pain that is severe or does not get better with medicine. °· You have nausea that is severe or does not go away. °· You have blood in your urine longer than your health care provider told you to expect. °· You have more blood in your urine. °· You have pain during urination that does not go away. °· You urinate more frequently than usual and this does not go away. °· You develop a rash or any other possible signs of an allergic reaction. °Get help right away if: °· You have severe pain in   your back, sides, or upper abdomen. °· You have severe pain while urinating. °· Your urine is very dark red. °· You have blood in your stool (feces). °· You cannot pass any urine at all. °· You feel a strong urge to urinate after emptying your bladder. °· You have a fever or chills. °· You develop shortness of breath, difficulty breathing, or chest pain. °· You have severe nausea that leads to persistent vomiting. °· You faint. °Summary °· After this procedure, it is common to have some pain, discomfort, or nausea when pieces (fragments) of the  kidney stone move through the tube that carries urine from the kidney to the bladder (ureter). If this pain or nausea is severe, however, you should contact your health care provider. °· Most people can resume normal activities 1-2 days after the procedure. Ask your health care provider what activities are safe for you. °· Drink enough water and fluids to keep your urine clear or pale yellow. This helps any remaining pieces of the stone to pass, and it can help prevent new stones from forming. °· If directed, strain your urine and keep all fragments for your health care provider to see. Fragments or stones may be as small as a grain of salt. °· Get help right away if you have severe pain in your back, sides, or upper abdomen or have severe pain while urinating. °This information is not intended to replace advice given to you by your health care provider. Make sure you discuss any questions you have with your health care provider. °Document Released: 07/07/2007 Document Revised: 05/08/2016 Document Reviewed: 05/08/2016 °Elsevier Interactive Patient Education © 2017 Elsevier Inc. ° ° ° ° ° ° °AMBULATORY SURGERY  °DISCHARGE INSTRUCTIONS ° ° °1) The drugs that you were given will stay in your system until tomorrow so for the next 24 hours you should not: ° °A) Drive an automobile °B) Make any legal decisions °C) Drink any alcoholic beverage ° ° °2) You may resume regular meals tomorrow.  Today it is better to start with liquids and gradually work up to solid foods. ° °You may eat anything you prefer, but it is better to start with liquids, then soup and crackers, and gradually work up to solid foods. ° ° °3) Please notify your doctor immediately if you have any unusual bleeding, trouble breathing, redness and pain at the surgery site, drainage, fever, or pain not relieved by medication. ° ° ° °4) Additional Instructions: ° ° ° ° ° ° ° °Please contact your physician with any problems or Same Day Surgery at [phone],  Monday through Friday 6 am to 4 pm, or [place] at [facility] number at [phone]. ° °

## 2017-05-13 ENCOUNTER — Ambulatory Visit
Admission: RE | Admit: 2017-05-13 | Discharge: 2017-05-13 | Disposition: A | Discharge status: Home / Self Care | Payer: BLUE CROSS/BLUE SHIELD | Source: Ambulatory Visit | Attending: Urology | Admitting: Urology

## 2017-05-13 ENCOUNTER — Ambulatory Visit: Payer: BLUE CROSS/BLUE SHIELD | Admitting: Anesthesiology

## 2017-05-13 ENCOUNTER — Encounter: Payer: Self-pay | Admitting: *Deleted

## 2017-05-13 ENCOUNTER — Encounter
Admission: RE | Disposition: A | Discharge status: Home / Self Care | Payer: Self-pay | Source: Ambulatory Visit | Attending: Urology

## 2017-05-13 DIAGNOSIS — N189 Chronic kidney disease, unspecified: Secondary | ICD-10-CM | POA: Insufficient documentation

## 2017-05-13 DIAGNOSIS — J449 Chronic obstructive pulmonary disease, unspecified: Secondary | ICD-10-CM | POA: Insufficient documentation

## 2017-05-13 DIAGNOSIS — Z79899 Other long term (current) drug therapy: Secondary | ICD-10-CM | POA: Diagnosis not present

## 2017-05-13 DIAGNOSIS — I2510 Atherosclerotic heart disease of native coronary artery without angina pectoris: Secondary | ICD-10-CM | POA: Diagnosis not present

## 2017-05-13 DIAGNOSIS — G4730 Sleep apnea, unspecified: Secondary | ICD-10-CM | POA: Insufficient documentation

## 2017-05-13 DIAGNOSIS — Z466 Encounter for fitting and adjustment of urinary device: Secondary | ICD-10-CM | POA: Insufficient documentation

## 2017-05-13 DIAGNOSIS — Z955 Presence of coronary angioplasty implant and graft: Secondary | ICD-10-CM | POA: Diagnosis not present

## 2017-05-13 DIAGNOSIS — F17210 Nicotine dependence, cigarettes, uncomplicated: Secondary | ICD-10-CM | POA: Insufficient documentation

## 2017-05-13 DIAGNOSIS — I252 Old myocardial infarction: Secondary | ICD-10-CM | POA: Diagnosis not present

## 2017-05-13 DIAGNOSIS — I129 Hypertensive chronic kidney disease with stage 1 through stage 4 chronic kidney disease, or unspecified chronic kidney disease: Secondary | ICD-10-CM | POA: Diagnosis not present

## 2017-05-13 DIAGNOSIS — Z87442 Personal history of urinary calculi: Secondary | ICD-10-CM | POA: Diagnosis not present

## 2017-05-13 DIAGNOSIS — Z7982 Long term (current) use of aspirin: Secondary | ICD-10-CM | POA: Diagnosis not present

## 2017-05-13 HISTORY — PX: CYSTOSCOPY W/ URETERAL STENT REMOVAL: SHX1430

## 2017-05-13 SURGERY — REMOVAL, STENT, URETER, CYSTOSCOPIC
Anesthesia: General | Site: Ureter | Laterality: Right | Wound class: Clean Contaminated

## 2017-05-13 MED ORDER — PROPOFOL 10 MG/ML IV BOLUS
INTRAVENOUS | Status: DC | PRN
  Administered 2017-05-13: 30 mg via INTRAVENOUS
  Administered 2017-05-13: 170 mg via INTRAVENOUS

## 2017-05-13 MED ORDER — BELLADONNA ALKALOIDS-OPIUM 16.2-60 MG RE SUPP
RECTAL | Status: DC | PRN
Start: 2017-05-13 — End: 2017-05-13
  Administered 2017-05-13: 1 via RECTAL

## 2017-05-13 MED ORDER — BELLADONNA ALKALOIDS-OPIUM 16.2-60 MG RE SUPP
RECTAL | Status: AC
Start: 2017-05-13 — End: 2017-05-13
  Filled 2017-05-13: qty 1

## 2017-05-13 MED ORDER — LIDOCAINE HCL 2 % EX GEL
CUTANEOUS | Status: DC | PRN
  Administered 2017-05-13: 1 via URETHRAL

## 2017-05-13 MED ORDER — FAMOTIDINE 20 MG PO TABS
ORAL_TABLET | ORAL | Status: AC
  Filled 2017-05-13: qty 1

## 2017-05-13 MED ORDER — PROPOFOL 10 MG/ML IV BOLUS
INTRAVENOUS | Status: AC
  Filled 2017-05-13: qty 20

## 2017-05-13 MED ORDER — CEFAZOLIN SODIUM-DEXTROSE 1-4 GM/50ML-% IV SOLN
INTRAVENOUS | Status: AC
  Filled 2017-05-13: qty 50

## 2017-05-13 MED ORDER — FENTANYL CITRATE (PF) 100 MCG/2ML IJ SOLN
INTRAMUSCULAR | Status: DC | PRN
  Administered 2017-05-13: 50 ug via INTRAVENOUS

## 2017-05-13 MED ORDER — MIDAZOLAM HCL 2 MG/2ML IJ SOLN
INTRAMUSCULAR | Status: AC
  Filled 2017-05-13: qty 2

## 2017-05-13 MED ORDER — LEVOFLOXACIN 500 MG PO TABS
ORAL_TABLET | ORAL | Status: AC
  Filled 2017-05-13: qty 1

## 2017-05-13 MED ORDER — PHENYLEPHRINE HCL 10 MG/ML IJ SOLN
INTRAMUSCULAR | Status: DC | PRN
  Administered 2017-05-13 (×4): 100 ug via INTRAVENOUS

## 2017-05-13 MED ORDER — ONDANSETRON HCL 4 MG/2ML IJ SOLN
4.0000 mg | Freq: Once | INTRAMUSCULAR | Status: AC | PRN
  Administered 2017-05-13: 4 mg via INTRAVENOUS

## 2017-05-13 MED ORDER — DEXAMETHASONE SODIUM PHOSPHATE 10 MG/ML IJ SOLN
INTRAMUSCULAR | Status: DC | PRN
  Administered 2017-05-13: 10 mg via INTRAVENOUS

## 2017-05-13 MED ORDER — FAMOTIDINE 20 MG PO TABS
20.0000 mg | ORAL_TABLET | ORAL | Status: AC
  Administered 2017-05-13: 20 mg via ORAL

## 2017-05-13 MED ORDER — EPHEDRINE SULFATE 50 MG/ML IJ SOLN
INTRAMUSCULAR | Status: DC | PRN
  Administered 2017-05-13 (×2): 10 mg via INTRAVENOUS

## 2017-05-13 MED ORDER — LIDOCAINE HCL 2 % EX GEL
CUTANEOUS | Status: AC
  Filled 2017-05-13: qty 10

## 2017-05-13 MED ORDER — LIDOCAINE HCL (CARDIAC) 20 MG/ML IV SOLN
INTRAVENOUS | Status: DC | PRN
  Administered 2017-05-13: 100 mg via INTRAVENOUS

## 2017-05-13 MED ORDER — CEFAZOLIN (ANCEF) 1 G IV SOLR
1.0000 g | INTRAVENOUS | Status: DC
  Filled 2017-05-13: qty 1

## 2017-05-13 MED ORDER — LACTATED RINGERS IV SOLN
INTRAVENOUS | Status: DC
  Administered 2017-05-13 (×2): via INTRAVENOUS

## 2017-05-13 MED ORDER — FENTANYL CITRATE (PF) 100 MCG/2ML IJ SOLN: 25.0000 ug | INTRAMUSCULAR | Status: DC | PRN

## 2017-05-13 MED ORDER — ONDANSETRON HCL 4 MG/2ML IJ SOLN
INTRAMUSCULAR | Status: AC
  Filled 2017-05-13: qty 2

## 2017-05-13 MED ORDER — FENTANYL CITRATE (PF) 100 MCG/2ML IJ SOLN
INTRAMUSCULAR | Status: AC
  Filled 2017-05-13: qty 2

## 2017-05-13 MED ORDER — CEFAZOLIN SODIUM-DEXTROSE 1-4 GM/50ML-% IV SOLN
INTRAVENOUS | Status: DC | PRN
  Administered 2017-05-13: 1 g via INTRAVENOUS

## 2017-05-13 MED ORDER — MIDAZOLAM HCL 2 MG/2ML IJ SOLN
INTRAMUSCULAR | Status: DC | PRN
  Administered 2017-05-13: 2 mg via INTRAVENOUS

## 2017-05-13 MED ORDER — DEXAMETHASONE SODIUM PHOSPHATE 10 MG/ML IJ SOLN
INTRAMUSCULAR | Status: AC
  Filled 2017-05-13: qty 1

## 2017-05-13 SURGICAL SUPPLY — 17 items
BAG DRAIN CYSTO-URO LG1000N (MISCELLANEOUS) ×2 IMPLANT
GLOVE BIO SURGEON STRL SZ7 (GLOVE) ×4 IMPLANT
GLOVE BIO SURGEON STRL SZ7.5 (GLOVE) ×2 IMPLANT
GOWN STRL REUS W/ TWL LRG LVL3 (GOWN DISPOSABLE) ×1 IMPLANT
GOWN STRL REUS W/ TWL XL LVL3 (GOWN DISPOSABLE) ×1 IMPLANT
GOWN STRL REUS W/TWL LRG LVL3 (GOWN DISPOSABLE) ×1
GOWN STRL REUS W/TWL XL LVL3 (GOWN DISPOSABLE) ×1
GUIDEWIRE STR ZIPWIRE 035X150 (MISCELLANEOUS) ×2 IMPLANT
PACK CYSTO AR (MISCELLANEOUS) ×2 IMPLANT
PREP PVP WINGED SPONGE (MISCELLANEOUS) ×2 IMPLANT
SET CYSTO W/LG BORE CLAMP LF (SET/KITS/TRAYS/PACK) ×2 IMPLANT
SOL .9 NS 3000ML IRR  AL (IV SOLUTION) ×1
SOL .9 NS 3000ML IRR UROMATIC (IV SOLUTION) ×1 IMPLANT
SOL PREP PVP 2OZ (MISCELLANEOUS) ×2
SOLUTION PREP PVP 2OZ (MISCELLANEOUS) ×1 IMPLANT
SURGILUBE 2OZ TUBE FLIPTOP (MISCELLANEOUS) ×2 IMPLANT
WATER STERILE IRR 1000ML POUR (IV SOLUTION) ×2 IMPLANT

## 2017-05-13 NOTE — Anesthesia Postprocedure Evaluation (Signed)
Anesthesia Post Note  Patient: [name] Walker  Procedure(s) Performed: CYSTOSCOPY WITH STENT REMOVAL (Right Ureter)  Patient location during evaluation: PACU Anesthesia Type: General Level of consciousness: awake and alert Pain management: pain level controlled Vital Signs Assessment: post-procedure vital signs reviewed and stable Respiratory status: spontaneous breathing and respiratory function stable Cardiovascular status: stable Anesthetic complications: no     Last Vitals:  Vitals:   05/13/17 1342 05/13/17 1400  BP: 101/65 118/78  Pulse: 61 (!) 57  Resp: 17 16  Temp: (!) 36.2 C   SpO2: 96% 98%    Last Pain:  Vitals:   05/13/17 1400  TempSrc:   PainSc: 0-No pain                 Lilianna Case K

## 2017-05-13 NOTE — H&P (Signed)
NAME:  [name] Walker, [name] Walker                      ACCOUNT NO.:  MEDICAL RECORD NO.:  [id number]  LOCATION:                                 FACILITY:  PHYSICIAN:  Anola GurneyMichael Wolff          DATE OF BIRTH:  [birth year]  DATE OF ADMISSION: DATE OF DISCHARGE:                            HISTORY AND PHYSICAL   SAME-DAY SURGERY:  May 13, 2017.  CHIEF COMPLAINT:  Ureteral stent.  HISTORY OF PRESENT ILLNESS:  [name] Walker is a 61 year old white male with right ureterolithiasis and right nephrolithiasis.  He underwent right ureteroscopic ureterolithotomy with holmium laser lithotripsy and stent placement on September 25th and then lithotripsy for the renal stones on October 11th.  KUB in the office indicated that he was stone free now and he comes in today for stent retrieval.  PAST MEDICAL HISTORY:  ALLERGIES:  NO DRUG ALLERGIES.  CURRENT MEDICATIONS:  Atorvastatin, carvedilol, Entresto, aspirin, albuterol, and tamsulosin.  SURGICAL HISTORY:  Lithotripsy, 2016; ureteral stent placement, 2016; coronary artery stent placed in 2013.  SOCIAL HISTORY:  The patient smokes a pack a day and has a greater than 40-pack-year history.  He denied alcohol use.  FAMILY HISTORY:  Father died of coronary artery disease.  Mother is living age 60.  PAST AND CURRENT MEDICAL CONDITIONS: 1. Coronary artery disease. 2. Chronic kidney disease. 3. Recurrent kidney stone disease. 4. Erectile dysfunction.  REVIEW OF SYSTEMS:  The patient denies chest pain, shortness of breath, diabetes, or stroke.  PHYSICAL EXAMINATION:  GENERAL:  Well-nourished white male in no acute distress. HEENT:  Sclerae are clear.  Pupils are equally round, reactive to light and accommodation.  Extraocular movements are intact. NECK:  Supple.  No palpable cervical adenopathy.  No audible carotid bruits. LUNGS:  Clear to auscultation. CARDIOVASCULAR:  Regular rhythm and rate without audible murmurs. ABDOMEN:  Soft, nontender  abdomen. GU:  Deferred. RECTAL:  Deferred. NEUROMUSCULAR:  Alert and oriented x3.  IMPRESSION:  Right ureteral stent.  PLAN:  Cystoscopy with stent retrieval.          ______________________________ Anola GurneyMichael Wolff     MW/MEDQ  D:  05/13/2017  T:  05/13/2017  Job:  [id number]

## 2017-05-13 NOTE — H&P (Signed)
Date of Initial H&P: 05/13/17  History reviewed, patient examined, no change in status, stable for surgery.

## 2017-05-13 NOTE — Discharge Instructions (Signed)
Cystoscopy, Care After  Refer to this sheet in the next few weeks. These instructions provide you with information about caring for yourself after your procedure. Your health care provider may also give you more specific instructions. Your treatment has been planned according to current medical practices, but problems sometimes occur. Call your health care provider if you have any problems or questions after your procedure.  What can I expect after the procedure?  After the procedure, it is common to have:   Mild pain when you urinate. Pain should stop within a few minutes after you urinate. This may last for up to 1 week.   A small amount of blood in your urine for several days.   Feeling like you need to urinate but producing only a small amount of urine.    Follow these instructions at home:       Medicines    Take over-the-counter and prescription medicines only as told by your health care provider.   If you were prescribed an antibiotic medicine, take it as told by your health care provider. Do not stop taking the antibiotic even if you start to feel better.  General instructions        Return to your normal activities as told by your health care provider. Ask your health care provider what activities are safe for you.   Do not drive for 24 hours if you received a sedative.   Watch for any blood in your urine. If the amount of blood in your urine increases, call your health care provider.   Follow instructions from your health care provider about eating or drinking restrictions.   If a tissue sample was removed for testing (biopsy) during your procedure, it is your responsibility to get your test results. Ask your health care provider or the department performing the test when your results will be ready.   Drink enough fluid to keep your urine clear or pale yellow.   Keep all follow-up visits as told by your health care provider. This is important.  Contact a health care provider if:   You have  pain that gets worse or does not get better with medicine, especially pain when you urinate.   You have difficulty urinating.  Get help right away if:   You have more blood in your urine.   You have blood clots in your urine.   You have abdominal pain.   You have a fever or chills.   You are unable to urinate.  This information is not intended to replace advice given to you by your health care provider. Make sure you discuss any questions you have with your health care provider.  Document Released: 01/04/2005 Document Revised: 11/23/2015 Document Reviewed: 05/04/2015  Elsevier Interactive Patient Education  2017 Elsevier Inc[employer]

## 2017-05-13 NOTE — Anesthesia Procedure Notes (Signed)
Procedure Name: LMA Insertion Date/Time: 05/13/2017 1:01 PM Performed by: Junious SilkNoles, Giulliana Mcroberts, CRNA Pre-anesthesia Checklist: Patient identified, Patient being monitored, Timeout performed, Emergency Drugs available and Suction available Patient Re-evaluated:Patient Re-evaluated prior to induction Oxygen Delivery Method: Circle system utilized Preoxygenation: Pre-oxygenation with 100% oxygen Induction Type: IV induction Ventilation: Mask ventilation without difficulty LMA: LMA inserted LMA Size: 4.0 Tube type: Oral Number of attempts: 1 Placement Confirmation: positive ETCO2 and breath sounds checked- equal and bilateral Tube secured with: Tape Dental Injury: Teeth and Oropharynx as per pre-operative assessment

## 2017-05-13 NOTE — Anesthesia Preprocedure Evaluation (Signed)
Anesthesia Evaluation  Patient identified by MRN, date of birth, ID band Patient awake    Reviewed: Allergy & Precautions, NPO status , Patient's Chart, lab work & pertinent test results  History of Anesthesia Complications Negative for: history of anesthetic complications  Airway Mallampati: II       Dental   Pulmonary sleep apnea , COPD (last inhaler > 1 yr ago),  COPD inhaler, Current Smoker,           Cardiovascular hypertension, Pt. on medications and Pt. on home beta blockers + CAD, + Past MI and + Cardiac Stents  (-) dysrhythmias (-) Valvular Problems/Murmurs     Neuro/Psych neg Seizures    GI/Hepatic Neg liver ROS, neg GERD  ,  Endo/Other  neg diabetes  Renal/GU Renal InsufficiencyRenal disease (stones)     Musculoskeletal   Abdominal   Peds  Hematology   Anesthesia Other Findings   Reproductive/Obstetrics                             Anesthesia Physical Anesthesia Plan  ASA: III  Anesthesia Plan: General   Post-op Pain Management:    Induction: Intravenous  PONV Risk Score and Plan: 1 and Treatment may vary due to age or medical condition  Airway Management Planned: LMA  Additional Equipment:   Intra-op Plan:   Post-operative Plan:   Informed Consent: I have reviewed the patients History and Physical, chart, labs and discussed the procedure including the risks, benefits and alternatives for the proposed anesthesia with the patient or authorized representative who has indicated his/her understanding and acceptance.     Plan Discussed with:   Anesthesia Plan Comments:         Anesthesia Quick Evaluation

## 2017-05-13 NOTE — Op Note (Signed)
Preoperative diagnosis: Right ureteral stent  Postoperative diagnosis: Same  Procedure: Cystoscopy with stent retrieval  Surgeon: Suszanne ConnersMichael R. Evelene CroonWolff MD  Anesthesia: General  Indications:See the history and physical. After informed consent the above procedure(s) were requested     Technique and findings: After adequate general anesthesia was obtained the patient was placed into dorsal lithotomy position and the perineum was prepped and draped in the usual fashion.  The 21 French cystoscope was then coupled to the camera and visually advanced into the bladder.  The bladder was thoroughly inspected and no bladder lesions were identified.  The ureteral stent was identified and then engaged with the alligator forceps and removed.  The bladder was drained and cystoscope was removed.  An cc of viscous Xylocaine was instilled within the urethra and the bladder.  A B&O suppository was placed.  The procedure was terminated and patient transferred to the recovery room in stable condition.

## 2017-05-13 NOTE — Transfer of Care (Signed)
Immediate Anesthesia Transfer of Care Note  Patient: [name] Walker  Procedure(s) Performed: CYSTOSCOPY WITH STENT REMOVAL (Right Ureter)  Patient Location: PACU  Anesthesia Type:General  Level of Consciousness: sedated  Airway & Oxygen Therapy: Patient Spontanous Breathing and Patient connected to face mask oxygen  Post-op Assessment: Report given to RN and Post -op Vital signs reviewed and stable  Post vital signs: Reviewed and stable  Last Vitals:  Vitals:   05/13/17 1103 05/13/17 1315  BP: 103/65 (P) 94/61  Pulse: 63 (!) (P) 59  Resp: 20 (P) 12  Temp: 36.8 C (!) (P) 36.3 C  SpO2: 97%     Last Pain:  Vitals:   05/13/17 1315  TempSrc:   PainSc: (P) Asleep      Patients Stated Pain Goal: 0 (05/13/17 1103)  Complications: No apparent anesthesia complications

## 2017-05-13 NOTE — Anesthesia Post-op Follow-up Note (Signed)
Anesthesia QCDR form completed.        

## 2017-05-14 ENCOUNTER — Encounter: Payer: Self-pay | Admitting: Urology

## 2017-06-07 IMAGING — MR MR ABDOMEN WO/W CM
17 of 18 series · 42 of 48 positions shown · IV contrast (multihance)
Comparison: 01/06/2015

CLINICAL DATA: Evaluate renal lesion.

EXAM:
MRI ABDOMEN WITHOUT AND WITH CONTRAST
TECHNIQUE: Multiplanar multisequence MR imaging of the abdomen was performed
both before and after the administration of intravenous contrast.
CONTRAST:  20mL MULTIHANCE GADOBENATE DIMEGLUMINE 529 MG/ML IV SOLN

[Series 3: cor ssfse / · coronal · 7.0mm · 1.48mm/px · 3 of 40 slices shown]
[im 1/40]
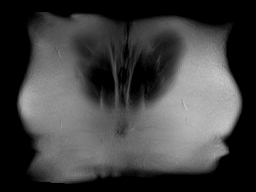
[im 20/40]
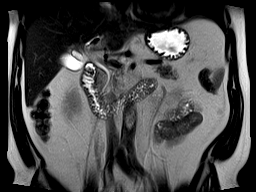
[im 40/40]
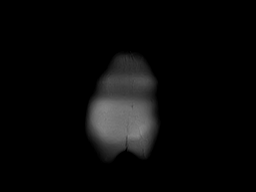

[Series 4: T2 · axial · 6.0mm · 1.48mm/px · 1 of 30 slices shown]
[im 1/30]
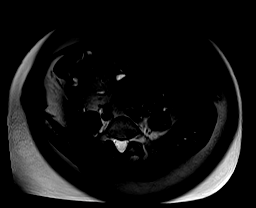

[Series 6: DWI · axial · 6.0mm · 2.00mm/px · z∈[-109,+100]mm · 4 of 90 slices shown (1 of 2)]
[im 1/90]
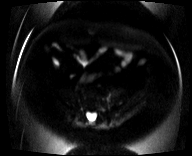
[im 30/90]
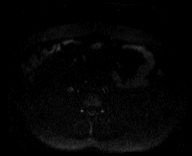
[im 60/90]
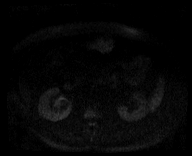
[im 90/90]
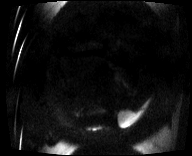

[Series 7: DWI · axial · 6.0mm · 2.00mm/px · 1 of 30 slices shown (2 of 2)]
[im 1/30]
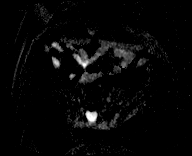

[Series 8: bSSFP · axial · 6.0mm · 0.74mm/px · 1 of 30 slices shown]
[im 1/30]
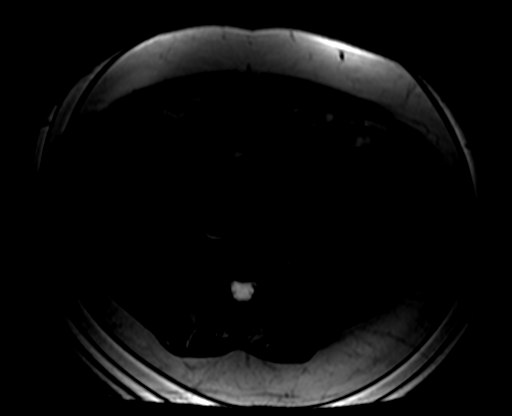

[Series 9: axial dynamic pre · axial · non-contrast · 4.0mm · 1.19mm/px · z∈[-131,+121]mm · 3 of 64 slices shown]
[im 1/64]
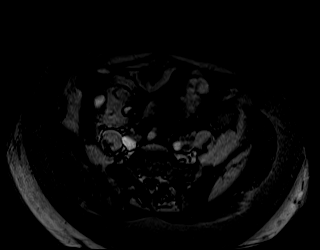
[im 32/64]
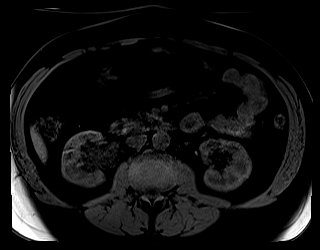
[im 64/64]
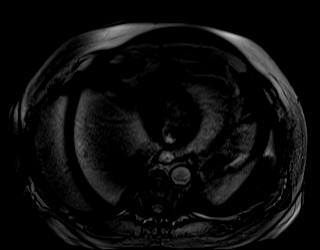

[Series 10: axial dynamic post · axial · 4.0mm · 1.19mm/px · z∈[-131,+121]mm · 3 of 64 slices shown (1 of 3)]
[im 1/64]
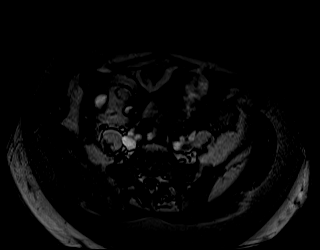
[im 32/64]
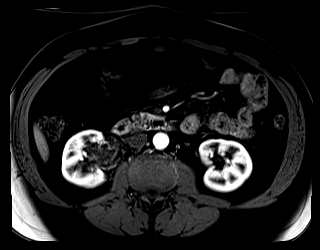
[im 64/64]
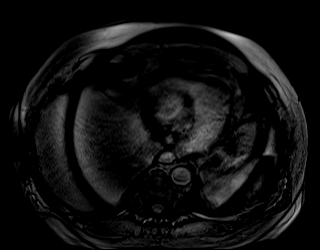

[Series 11: axial dynamic post · axial · 3.0mm · 1.19mm/px · z∈[-99,+90]mm · 3 of 64 slices shown (2 of 3)]
[im 1/64]
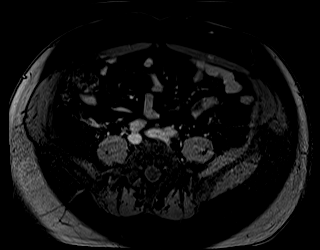
[im 32/64]
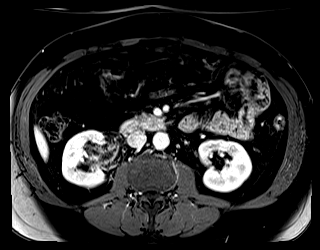
[im 64/64]
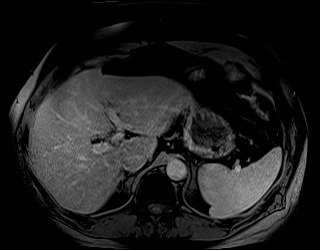

[Series 12: axial dynamic post · axial · 3.0mm · 1.19mm/px · z∈[-99,+90]mm · 3 of 64 slices shown (3 of 3)]
[im 1/64]
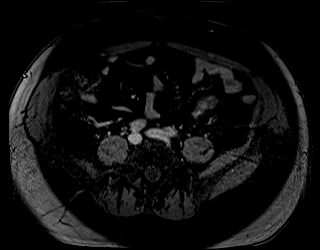
[im 32/64]
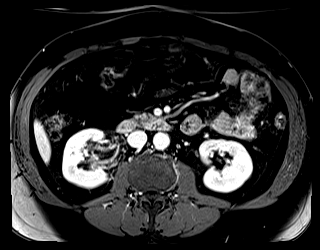
[im 64/64]
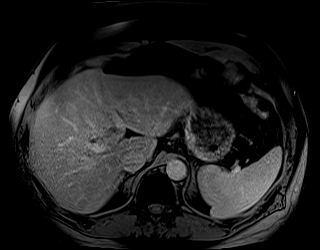

[Series 14: axial ssfse / · axial · 6.0mm · 1.19mm/px · 1 of 30 slices shown]
[im 1/30]
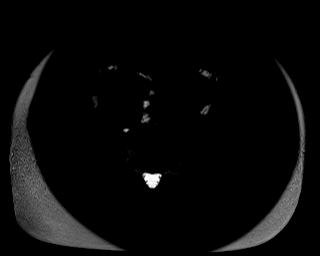

[Series 15: axial dynamic 3 · axial · 4.0mm · 1.19mm/px · z∈[-131,+121]mm · 3 of 64 slices shown]
[im 1/64]
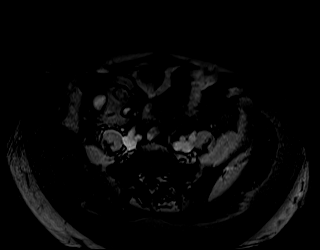
[im 32/64]
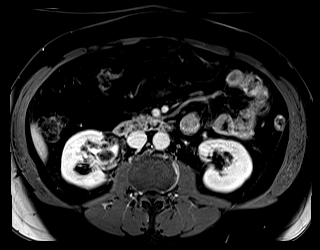
[im 64/64]
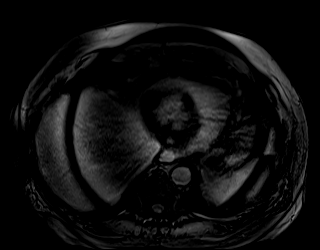

[Series 100: out of phase · axial · 6.0mm · 0.74mm/px · z∈[-109,+100]mm · 3 of 60 slices shown]
[im 1/60]
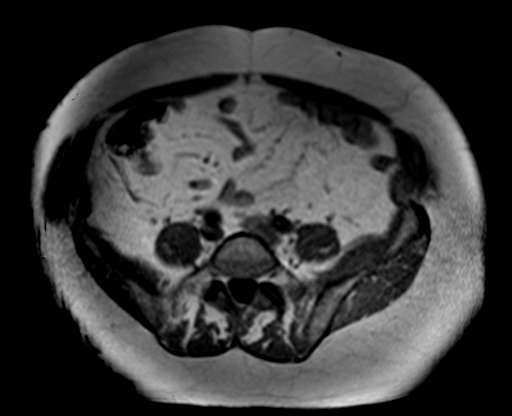
[im 30/60]
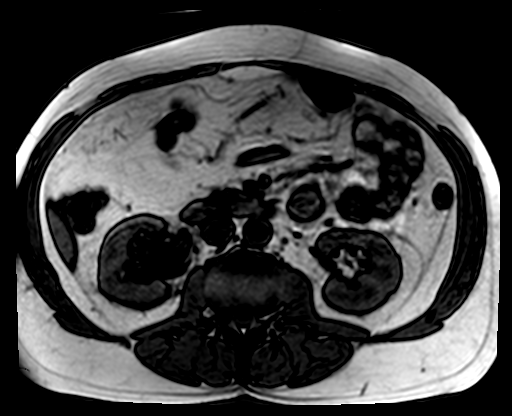
[im 60/60]
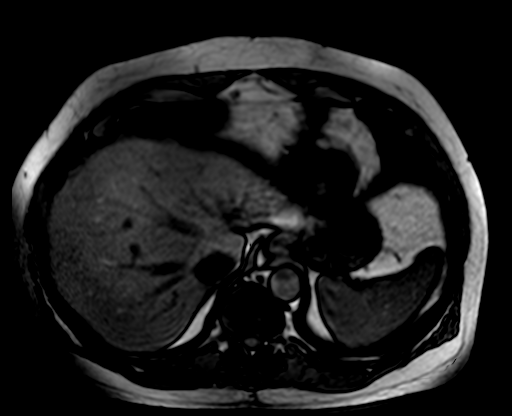

[Series 101: in phase · axial · 6.0mm · 0.74mm/px · 1 of 30 slices shown]
[im 1/30]
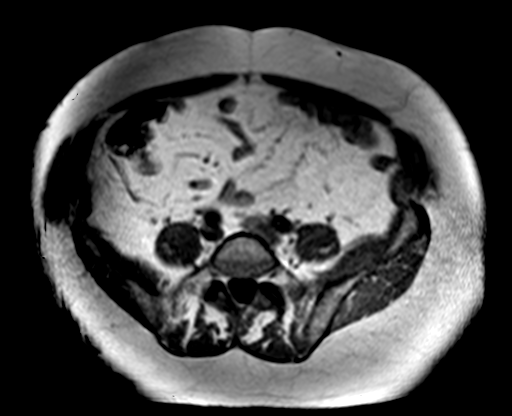

[Series 102: immed sub · axial · 4.0mm · 1.19mm/px · z∈[-131,+121]mm · 3 of 64 slices shown]
[im 1/64]
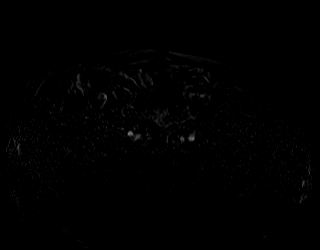
[im 32/64]
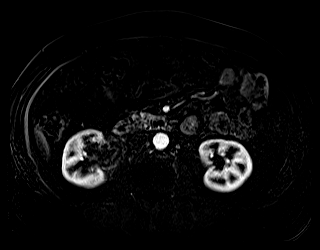
[im 64/64]
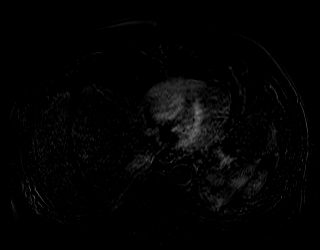

[Series 103: 45 sec sub · axial · 1.19mm/px · z∈[-115,+105]mm · 3 of 64 slices shown]
[im 1/64]
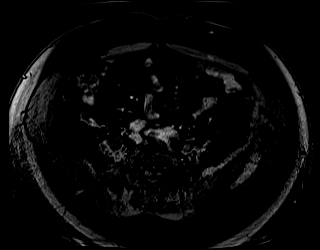
[im 32/64]
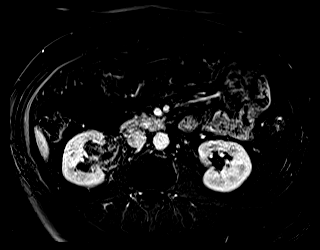
[im 64/64]
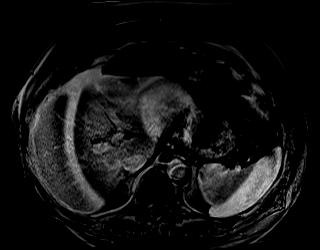

[Series 104: 90 sec sub · axial · 1.19mm/px · z∈[-115,+105]mm · 3 of 64 slices shown]
[im 1/64]
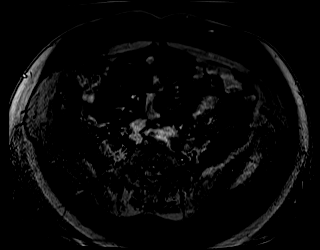
[im 32/64]
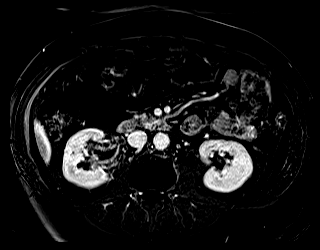
[im 64/64]
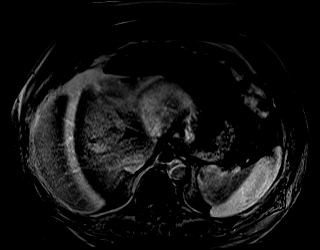

[Series 105: 3 min sub · axial · 4.0mm · 1.19mm/px · z∈[-131,+121]mm · 3 of 64 slices shown]
[im 1/64]
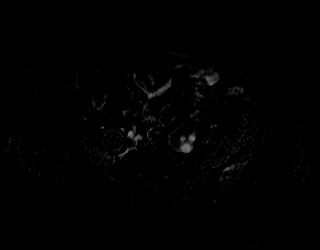
[im 32/64]
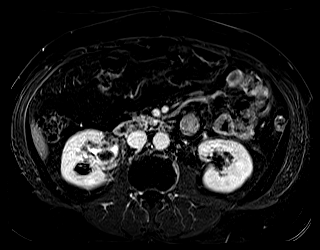
[im 64/64]
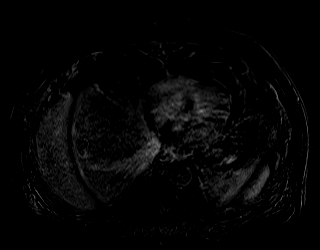

[42 of 48 positions shown; findings below may reference images not displayed]

FINDINGS: Lower chest: No pleural effusion. No pericardial effusion the
visualized portions of the liver are normal.

Hepatobiliary: No suspicious liver abnormality. Gallbladder appears
normal. No biliary dilatation.

Pancreas: Negative

Spleen: Negative

Adrenals/Urinary Tract: The adrenal glands are normal. Normal
appearance of the left kidney. Several right renal cysts are noted.
No abnormal enhancement identified. These are consistent with
Bosniak category 1 lesions. There is right-sided pelvocaliectasis.
Urothelial enhancement of the proximal right renal collecting system
is noted with surrounding inflammation. Stone within the right renal
pelvis measures 1.9 cm. There is periureteral inflammation
identified on the right.

Stomach/Bowel: The stomach and visualized upper abdominal bowel
loops are normal.

Vascular/Lymphatic: Normal appearance of the abdominal aorta. No
upper abdominal adenopathy.

Other: No free fluid or fluid collections identified within the
upper abdomen.

Musculoskeletal: Normal signal is identified throughout the bone
marrow.
IMPRESSION: 1. Bosniak category 1 lesions are identified within the right
kidney.
2. Right renal pelves caliectasis secondary to large stone in the
right renal pelvis.

## 2017-06-18 ENCOUNTER — Telehealth: Payer: Self-pay

## 2017-06-18 NOTE — Telephone Encounter (Signed)
Prior auth for [employer] obtained through [place] [employer]. Ref # [id number] and is valid for 1 year.

## 2017-11-05 ENCOUNTER — Other Ambulatory Visit: Payer: Self-pay | Admitting: Interventional Cardiology

## 2017-12-09 ENCOUNTER — Other Ambulatory Visit: Payer: Self-pay | Admitting: Interventional Cardiology

## 2017-12-11 ENCOUNTER — Telehealth: Payer: Self-pay | Admitting: Interventional Cardiology

## 2017-12-11 MED ORDER — ATORVASTATIN CALCIUM 40 MG PO TABS: 40.0000 mg | ORAL_TABLET | Freq: Every day | ORAL | 1 refills | Status: DC

## 2017-12-11 MED ORDER — SACUBITRIL-VALSARTAN 24-26 MG PO TABS: 1.0000 | ORAL_TABLET | Freq: Two times a day (BID) | ORAL | 1 refills | Status: DC

## 2017-12-11 MED ORDER — CARVEDILOL 12.5 MG PO TABS: 12.5000 mg | ORAL_TABLET | Freq: Two times a day (BID) | ORAL | 1 refills | Status: DC

## 2017-12-11 NOTE — Telephone Encounter (Signed)
New Message:       Pt is calling and states he is needing an appt in the next two weeks because he has no more refills on his medication. The next available for the PA is 7/30 and Dr. Katrinka BlazingSmith has nothing until October.

## 2017-12-11 NOTE — Telephone Encounter (Signed)
Called pt back and wife states that he has left for work.  Scheduled pt to see Norma FredricksonLori Gerhardt, NP on 7/31 at 3pm and advised I would send in prescriptions to last until appt.  Called pt's cell phone and left message letting him know this information and to call back if any questions or conflict with this appt.

## 2018-01-28 ENCOUNTER — Encounter: Payer: Self-pay | Admitting: Nurse Practitioner

## 2018-01-28 ENCOUNTER — Encounter (INDEPENDENT_AMBULATORY_CARE_PROVIDER_SITE_OTHER): Payer: Self-pay

## 2018-01-28 ENCOUNTER — Ambulatory Visit (INDEPENDENT_AMBULATORY_CARE_PROVIDER_SITE_OTHER): Payer: BLUE CROSS/BLUE SHIELD | Admitting: Nurse Practitioner

## 2018-01-28 VITALS — BP 120/70 | HR 63 | Ht 72.0 in | Wt 207.1 lb

## 2018-01-28 DIAGNOSIS — I5022 Chronic systolic (congestive) heart failure: Secondary | ICD-10-CM | POA: Diagnosis not present

## 2018-01-28 DIAGNOSIS — E7849 Other hyperlipidemia: Secondary | ICD-10-CM

## 2018-01-28 DIAGNOSIS — I2510 Atherosclerotic heart disease of native coronary artery without angina pectoris: Secondary | ICD-10-CM

## 2018-01-28 MED ORDER — NITROGLYCERIN 0.4 MG SL SUBL: 0.4000 mg | SUBLINGUAL_TABLET | SUBLINGUAL | 3 refills | Status: DC | PRN

## 2018-01-28 MED ORDER — CARVEDILOL 12.5 MG PO TABS
12.5000 mg | ORAL_TABLET | Freq: Two times a day (BID) | ORAL | 3 refills | Status: DC
Start: 2018-01-28 — End: 2019-02-22

## 2018-01-28 MED ORDER — SACUBITRIL-VALSARTAN 24-26 MG PO TABS: 1.0000 | ORAL_TABLET | Freq: Two times a day (BID) | ORAL | 3 refills | Status: DC

## 2018-01-28 MED ORDER — ATORVASTATIN CALCIUM 40 MG PO TABS: 40.0000 mg | ORAL_TABLET | Freq: Every day | ORAL | 3 refills | Status: DC

## 2018-01-28 NOTE — Patient Instructions (Addendum)
We will be checking the following labs today - BMET, HPF and lipids   Medication Instructions:    Continue with your current medicines.   I have sent in your refills today    Testing/Procedures To Be Arranged:  N/A  Follow-Up:   See Dr. Katrinka BlazingSmith in one year    Other Special Instructions:   N/A    If you need a refill on your cardiac medications before your next appointment, please call your pharmacy.   Call the [facility] office at [phone] if you have any questions, problems or concerns.

## 2018-01-28 NOTE — Progress Notes (Signed)
CARDIOLOGY OFFICE NOTE  Date:  01/28/2018    [name] OatsBobby D Behrman Date of Birth: [birth year] Medical Record #[id number]  PCP:  Barbette ReichmannHande, Vishwanath, MD  Cardiologist:  Katrinka BlazingSmith    Chief Complaint  Patient presents with  . Coronary Artery Disease  . Cardiomyopathy    Follow up visit - seen for Dr. Katrinka BlazingSmith    History of Present Illness: [name] Walker is a 62 y.o. male who presents today for a follow up visit. Seen for Dr. Katrinka BlazingSmith.   He has a history of known CAD with prior ischemic CM with EF less than 35%, chronic systolic HF, ongoing tobacco abuse, HTN and OSA.   His record makes not that he has seen Dr. Graciela HusbandsKlein in the past and were planning on ICD implant - however that never happened and he is no longer in favor of proceeding.   Last seen by Dr. Katrinka BlazingSmith in May of 2018 - was felt to be doing well. Still smoking. Cardiac status ok. He still did not wish to proceed with ICD implant.   Comes in today. Here alone. Needs medicines refilled. He is doing well otherwise. No chest pain. Breathing is fine. Not dizzy. No swelling. Weight is stable. He is down to smoking 1/2 pack of cigarettes from 2 packs. He still does not wish to proceed with ICD implant - says he does not see a need for that. He is happy with how he is doing currently and really has no complaint.    Past Medical History:  Diagnosis Date  . Bronchitis    CHRONIC  . Chronic kidney disease    stone  . Coronary artery disease 2013   BMS to LAD  . Dysrhythmia 04/01/2017   ekg showed changes from previous ekgs. cleared for surgery by cardiology  . Erectile dysfunction   . History of kidney stones   . HOH (hard of hearing)   . Hypercholesterolemia   . Hypertension   . Ischemic cardiomyopathy    EF 25-30%   . MI (myocardial infarction) (HCC) 06/19/2012   being considered for defibilator  . NSTEMI (non-ST elevated myocardial infarction) (HCC) 03/2015   Patent BMS - LAD stent.  . Varicose veins     Past Surgical History:    Procedure Laterality Date  . CARDIAC CATHETERIZATION N/A 01/09/2015   Procedure: Left Heart Cath and Coronary Angiography;  Surgeon: Lyn RecordsHenry W Smith, MD;  [facility];  Service: Cardiovascular;  Laterality: N/A;  . CARDIAC CATHETERIZATION N/A 03/13/2015   Procedure: Left Heart Cath and Coronary Angiography;  Surgeon: Lyn RecordsHenry W Smith, MD;  [facility];  Service: Cardiovascular;  Laterality: N/A;  . CORONARY ANGIOPLASTY     STENT  . CORONARY STENT PLACEMENT    . CYSTOSCOPY W/ URETERAL STENT REMOVAL Right 05/13/2017   Procedure: CYSTOSCOPY WITH STENT REMOVAL;  Surgeon: Orson ApeWolff, Michael R, MD;  Location: [facility] ORS;  Service: Urology;  Laterality: Right;  . CYSTOSCOPY WITH STENT PLACEMENT Right 03/07/2015   Procedure: CYSTOSCOPY WITH STENT PLACEMENT;  Surgeon: Orson ApeMichael R Wolff, MD;  Location: [facility] ORS;  Service: Urology;  Laterality: Right;  . CYSTOSCOPY WITH STENT PLACEMENT Right 03/25/2017   Procedure: CYSTOSCOPY WITH STENT PLACEMENT;  Surgeon: Orson ApeWolff, Michael R, MD;  Location: [facility] ORS;  Service: Urology;  Laterality: Right;  . CYSTOSCOPY/RETROGRADE/URETEROSCOPY/STONE EXTRACTION WITH BASKET Right 03/25/2017   Procedure: CYSTOSCOPY/RETROGRADE/URETEROSCOPY/STONE EXTRACTION WITH BASKET;  Surgeon: Orson ApeWolff, Michael R, MD;  Location: [facility] ORS;  Service: Urology;  Laterality: Right;  .  EXTRACORPOREAL SHOCK WAVE LITHOTRIPSY Right 03/09/2015   Procedure: EXTRACORPOREAL SHOCK WAVE LITHOTRIPSY (ESWL);  Surgeon: Orson Ape, MD;  Location: [facility] ORS;  Service: Urology;  Laterality: Right;  . EXTRACORPOREAL SHOCK WAVE LITHOTRIPSY Right 05/11/2015   Procedure: EXTRACORPOREAL SHOCK WAVE LITHOTRIPSY (ESWL);  Surgeon: Orson Ape, MD;  Location: [facility] ORS;  Service: Urology;  Laterality: Right;  . EXTRACORPOREAL SHOCK WAVE LITHOTRIPSY Right 04/10/2017   Procedure: EXTRACORPOREAL SHOCK WAVE LITHOTRIPSY (ESWL);  Surgeon: Orson Ape, MD;  Location: [facility] ORS;  Service: Urology;   Laterality: Right;  . LEFT HEART CATHETERIZATION WITH CORONARY ANGIOGRAM Right 06/19/2012   Procedure: LEFT HEART CATHETERIZATION WITH CORONARY ANGIOGRAM;  Surgeon: Lesleigh Noe, MD;  Location: [facility] CATH LAB;  Service: Cardiovascular;  Laterality: Right;  . PERCUTANEOUS CORONARY STENT INTERVENTION (PCI-S) Right 06/19/2012   Procedure: PERCUTANEOUS CORONARY STENT INTERVENTION (PCI-S);  Surgeon: Lesleigh Noe, MD;  Location: [facility] CATH LAB;  Service: Cardiovascular;  Laterality: Right;     Medications: Current Meds  Medication Sig  . acetaminophen (TYLENOL) 500 MG tablet Take 1,000 mg daily as needed by mouth for moderate pain or headache.  . albuterol (PROVENTIL HFA;VENTOLIN HFA) 108 (90 BASE) MCG/ACT inhaler Inhale 2 puffs into the lungs every 6 (six) hours as needed for wheezing or shortness of breath (chest tightness). ProAir  . atorvastatin (LIPITOR) 40 MG tablet Take 1 tablet (40 mg total) by mouth daily.  . carvedilol (COREG) 12.5 MG tablet Take 1 tablet (12.5 mg total) by mouth 2 (two) times daily. Please keep appt to continue receiving refills.  . nitroGLYCERIN (NITROSTAT) 0.4 MG SL tablet Place 1 tablet (0.4 mg total) under the tongue every 5 (five) minutes x 3 doses as needed for chest pain.  . sacubitril-valsartan (ENTRESTO) 24-26 MG Take 1 tablet by mouth 2 (two) times daily. Please keep appt to continue getting refills.  . tamsulosin (FLOMAX) 0.4 MG CAPS capsule Take 1 capsule (0.4 mg total) by mouth daily.  . [DISCONTINUED] atorvastatin (LIPITOR) 40 MG tablet Take 1 tablet (40 mg total) by mouth daily. Please keep appt to continue receiving refills.  . [DISCONTINUED] carvedilol (COREG) 12.5 MG tablet Take 1 tablet (12.5 mg total) by mouth 2 (two) times daily. Please keep appt to continue receiving refills.  . [DISCONTINUED] nitroGLYCERIN (NITROSTAT) 0.4 MG SL tablet Place 1 tablet (0.4 mg total) under the tongue every 5 (five) minutes x 3 doses as needed for chest pain.  .  [DISCONTINUED] sacubitril-valsartan (ENTRESTO) 24-26 MG Take 1 tablet by mouth 2 (two) times daily. Please keep appt to continue getting refills.     Allergies: Allergies  Allergen Reactions  . Nucynta [Tapentadol] Nausea Only and Other (See Comments)    Dizziness, woozy.  Sick to his stomach  . Codeine Other (See Comments)    Too drowsy    Social History: The patient  reports that he has been smoking cigarettes.  He has a 20.00 pack-year smoking history. He has never used smokeless tobacco. He reports that he drinks alcohol. He reports that he does not use drugs.   Family History: The patient's family history includes Cancer in his father; Heart attack in his brother; Heart murmur in his mother.   Review of Systems: Please see the history of present illness.   Otherwise, the review of systems is positive for none.   All other systems are reviewed and negative.   Physical Exam: VS:  BP 120/70 (BP Location: Left Arm, Patient Position: Sitting, Cuff Size: Normal)  Pulse 63   Ht 6' (1.829 m)   Wt 207 lb 1.9 oz (93.9 kg)   SpO2 95% Comment: at rest  BMI 28.09 kg/m  .  BMI Body mass index is 28.09 kg/m.  Wt Readings from Last 3 Encounters:  01/28/18 207 lb 1.9 oz (93.9 kg)  05/13/17 206 lb (93.4 kg)  03/25/17 220 lb (99.8 kg)    General: Alert. He looks older than his stated age. He is alert and in no acute distress.   HEENT: Normal.  Neck: Supple, no JVD, carotid bruits, or masses noted.  Cardiac: Regular rate and rhythm. Heart tones are distant. No edema.  Respiratory:  Lungs are clear to auscultation bilaterally with normal work of breathing.  GI: Soft and nontender.  MS: No deformity or atrophy. Gait and ROM intact.  Skin: Warm and dry. Color is normal.  Neuro:  Strength and sensation are intact and no gross focal deficits noted.  Psych: Alert, appropriate and with normal affect.   LABORATORY DATA:  EKG:  EKG is not ordered today.   Lab Results  Component Value  Date   WBC 5.6 03/14/2015   HGB 13.0 03/14/2015   HCT 37.9 (L) 03/14/2015   PLT 141 (L) 03/14/2015   GLUCOSE 109 (H) 12/06/2016   CHOL 111 03/12/2015   TRIG 121 03/12/2015   HDL 32 (L) 03/12/2015   LDLCALC 55 03/12/2015   ALT 11 12/06/2016   AST 12 12/06/2016   NA 142 12/06/2016   K 4.3 12/06/2016   CL 106 12/06/2016   CREATININE 0.96 12/06/2016   BUN 17 12/06/2016   CO2 22 12/06/2016   INR 1.10 03/13/2015     BNP (last 3 results) No results for input(s): BNP in the last 8760 hours.  ProBNP (last 3 results) No results for input(s): PROBNP in the last 8760 hours.   Other Studies Reviewed Today:  Echocardiogram June 2017  Study Conclusions  - Left ventricle: The cavity size was moderately dilated. Systolic function was moderately to severely reduced. The estimated ejection fraction was in the range of 30% to 35%. Diffuse hypokinesis. Hypokinesis of the anterior myocardium. Hypokinesis of the anteroseptal myocardium. Akinesis of the apical myocardium. Doppler parameters are consistent with abnormal left ventricular relaxation (grade 1 diastolic dysfunction). - Aortic root: The aortic root was mildly dilated. - Mitral valve: There was mild regurgitation. - Right ventricle: Systolic function was normal. - Pulmonary arteries: Systolic pressure was within the normal range. - Inferior vena cava: The vessel was normal in size. The respirophasic diameter changes were in the normal range (>= 50%), consistent with normal central venous pressure.    Assessment/Plan:  1. Ischemic CM - does not wish to proceed with ICD implant - doing well on current regimen. NYHA I/II. Appears well compensated today.   2. Chronic systolic HF - EF is 30 to 35%. Medicines refilled. He is doing well from our standpoint.   3. CAD - no active symptoms.   4. HLD - on statin - needs labs  5. Tobacco abuse - smoking less but not ready to stop totally.     Current  medicines are reviewed with the patient today.  The patient does not have concerns regarding medicines other than what has been noted above.  The following changes have been made:  See above.  Labs/ tests ordered today include:    Orders Placed This Encounter  Procedures  . Basic metabolic panel  . Hepatic function panel  . Lipid panel  Disposition:   FU with Dr. Katrinka Blazing in one year.   Patient is agreeable to this plan and will call if any problems develop in the interim.   SignedNorma Fredrickson, NP  01/28/2018 3:28 PM  [facility] [address] [place], [place]  [address] Phone: [phone] Fax: [phone]

## 2018-01-29 LAB — LIPID PANEL
Chol/HDL Ratio: 2.9 ratio (ref 0.0–5.0)
Cholesterol, Total: 124 mg/dL (ref 100–199)
HDL: 43 mg/dL (ref >39)
LDL Calculated: 59 mg/dL (ref 0–99)
Triglycerides: 109 mg/dL (ref 0–149)
VLDL Cholesterol Cal: 22 mg/dL (ref 5–40)

## 2018-01-29 LAB — HEPATIC FUNCTION PANEL
ALT: 9 IU/L (ref 0–44)
AST: 13 IU/L (ref 0–40)
Albumin: 4.1 g/dL (ref 3.6–4.8)
Alkaline Phosphatase: 61 IU/L (ref 39–117)
Bilirubin Total: 0.7 mg/dL (ref 0.0–1.2)
Bilirubin, Direct: 0.2 mg/dL (ref 0.00–0.40)
Total Protein: 6.2 g/dL (ref 6.0–8.5)

## 2018-01-29 LAB — BASIC METABOLIC PANEL
BUN/Creatinine Ratio: 20 (ref 10–24)
BUN: 19 mg/dL (ref 8–27)
CO2: 24 mmol/L (ref 20–29)
Calcium: 9.2 mg/dL (ref 8.6–10.2)
Chloride: 105 mmol/L (ref 96–106)
Creatinine, Ser: 0.94 mg/dL (ref 0.76–1.27)
GFR calc Af Amer: 101 mL/min/{1.73_m2} (ref >59)
GFR calc non Af Amer: 88 mL/min/{1.73_m2} (ref >59)
Glucose: 121 mg/dL — ABNORMAL HIGH (ref 65–99)
Potassium: 4.2 mmol/L (ref 3.5–5.2)
Sodium: 142 mmol/L (ref 134–144)

## 2018-02-10 ENCOUNTER — Encounter: Payer: Self-pay | Admitting: *Deleted

## 2018-02-23 ENCOUNTER — Encounter: Payer: Self-pay | Admitting: *Deleted

## 2018-02-23 DIAGNOSIS — N200 Calculus of kidney: Secondary | ICD-10-CM | POA: Diagnosis not present

## 2018-02-23 DIAGNOSIS — R110 Nausea: Secondary | ICD-10-CM | POA: Diagnosis not present

## 2018-02-23 DIAGNOSIS — R239 Unspecified skin changes: Secondary | ICD-10-CM | POA: Diagnosis not present

## 2018-02-23 MED ORDER — CEFAZOLIN SODIUM-DEXTROSE 1-4 GM/50ML-% IV SOLN
1.0000 g | Freq: Once | INTRAVENOUS | Status: AC
  Administered 2018-02-24: 1 g via INTRAVENOUS

## 2018-02-24 ENCOUNTER — Encounter
Admission: RE | Disposition: A | Discharge status: Home / Self Care | Payer: Self-pay | Source: Ambulatory Visit | Attending: Urology

## 2018-02-24 ENCOUNTER — Ambulatory Visit: Payer: BLUE CROSS/BLUE SHIELD | Admitting: Anesthesiology

## 2018-02-24 ENCOUNTER — Ambulatory Visit
Admission: RE | Admit: 2018-02-24 | Discharge: 2018-02-24 | Disposition: A | Discharge status: Home / Self Care | Payer: BLUE CROSS/BLUE SHIELD | Source: Ambulatory Visit | Attending: Urology | Admitting: Urology

## 2018-02-24 DIAGNOSIS — Z7982 Long term (current) use of aspirin: Secondary | ICD-10-CM | POA: Diagnosis not present

## 2018-02-24 DIAGNOSIS — I130 Hypertensive heart and chronic kidney disease with heart failure and stage 1 through stage 4 chronic kidney disease, or unspecified chronic kidney disease: Secondary | ICD-10-CM | POA: Insufficient documentation

## 2018-02-24 DIAGNOSIS — Z79899 Other long term (current) drug therapy: Secondary | ICD-10-CM | POA: Insufficient documentation

## 2018-02-24 DIAGNOSIS — G4730 Sleep apnea, unspecified: Secondary | ICD-10-CM | POA: Diagnosis not present

## 2018-02-24 DIAGNOSIS — I2510 Atherosclerotic heart disease of native coronary artery without angina pectoris: Secondary | ICD-10-CM | POA: Diagnosis not present

## 2018-02-24 DIAGNOSIS — F17200 Nicotine dependence, unspecified, uncomplicated: Secondary | ICD-10-CM | POA: Insufficient documentation

## 2018-02-24 DIAGNOSIS — N201 Calculus of ureter: Secondary | ICD-10-CM | POA: Diagnosis not present

## 2018-02-24 DIAGNOSIS — I252 Old myocardial infarction: Secondary | ICD-10-CM | POA: Insufficient documentation

## 2018-02-24 DIAGNOSIS — N200 Calculus of kidney: Secondary | ICD-10-CM | POA: Diagnosis not present

## 2018-02-24 DIAGNOSIS — Z87442 Personal history of urinary calculi: Secondary | ICD-10-CM | POA: Insufficient documentation

## 2018-02-24 DIAGNOSIS — I10 Essential (primary) hypertension: Secondary | ICD-10-CM | POA: Diagnosis not present

## 2018-02-24 DIAGNOSIS — N189 Chronic kidney disease, unspecified: Secondary | ICD-10-CM | POA: Diagnosis not present

## 2018-02-24 DIAGNOSIS — Z955 Presence of coronary angioplasty implant and graft: Secondary | ICD-10-CM | POA: Diagnosis not present

## 2018-02-24 DIAGNOSIS — I509 Heart failure, unspecified: Secondary | ICD-10-CM | POA: Diagnosis not present

## 2018-02-24 DIAGNOSIS — I5022 Chronic systolic (congestive) heart failure: Secondary | ICD-10-CM | POA: Diagnosis not present

## 2018-02-24 HISTORY — PX: URETEROSCOPY WITH HOLMIUM LASER LITHOTRIPSY: SHX6645

## 2018-02-24 HISTORY — PX: CYSTOSCOPY/RETROGRADE/URETEROSCOPY: SHX5316

## 2018-02-24 LAB — CBC
HCT: 42.3 % (ref 40.0–52.0)
Hemoglobin: 14.4 g/dL (ref 13.0–18.0)
MCH: 33.8 pg (ref 26.0–34.0)
MCHC: 34.1 g/dL (ref 32.0–36.0)
MCV: 99 fL (ref 80.0–100.0)
Platelets: 160 10*3/uL (ref 150–440)
RBC: 4.27 MIL/uL — ABNORMAL LOW (ref 4.40–5.90)
RDW: 14 % (ref 11.5–14.5)
WBC: 13.3 10*3/uL — ABNORMAL HIGH (ref 3.8–10.6)

## 2018-02-24 LAB — BASIC METABOLIC PANEL
Anion gap: 11 (ref 5–15)
BUN: 20 mg/dL (ref 6–20)
CO2: 27 mmol/L (ref 22–32)
Calcium: 9 mg/dL (ref 8.9–10.3)
Chloride: 104 mmol/L (ref 98–111)
Creatinine, Ser: 1.03 mg/dL (ref 0.61–1.24)
GFR calc Af Amer: >60 mL/min (ref >60)
GFR calc non Af Amer: >60 mL/min (ref >60)
Glucose, Bld: 109 mg/dL — ABNORMAL HIGH (ref 70–99)
Potassium: 3.3 mmol/L — ABNORMAL LOW (ref 3.5–5.1)
Sodium: 142 mmol/L (ref 135–145)

## 2018-02-24 SURGERY — URETEROSCOPY, WITH LITHOTRIPSY USING HOLMIUM LASER
Anesthesia: Choice | Laterality: Right | Wound class: "Clean Contaminated "

## 2018-02-24 MED ORDER — SUCCINYLCHOLINE CHLORIDE 20 MG/ML IJ SOLN
INTRAMUSCULAR | Status: DC | PRN
  Administered 2018-02-24: 100 mg via INTRAVENOUS

## 2018-02-24 MED ORDER — BELLADONNA ALKALOIDS-OPIUM 16.2-60 MG RE SUPP
RECTAL | Status: AC
  Filled 2018-02-24: qty 1

## 2018-02-24 MED ORDER — ONDANSETRON HCL 4 MG/2ML IJ SOLN
INTRAMUSCULAR | Status: AC
  Filled 2018-02-24: qty 2

## 2018-02-24 MED ORDER — FAMOTIDINE 20 MG PO TABS
20.0000 mg | ORAL_TABLET | Freq: Once | ORAL | Status: AC
  Administered 2018-02-24: 20 mg via ORAL

## 2018-02-24 MED ORDER — LACTATED RINGERS IV SOLN
INTRAVENOUS | Status: DC | PRN
  Administered 2018-02-24: 14:00:00 via INTRAVENOUS

## 2018-02-24 MED ORDER — LIDOCAINE HCL (CARDIAC) PF 100 MG/5ML IV SOSY
PREFILLED_SYRINGE | INTRAVENOUS | Status: DC | PRN
  Administered 2018-02-24: 100 mg via INTRAVENOUS

## 2018-02-24 MED ORDER — PROPOFOL 10 MG/ML IV BOLUS
INTRAVENOUS | Status: DC | PRN
  Administered 2018-02-24: 120 mg via INTRAVENOUS

## 2018-02-24 MED ORDER — DEXAMETHASONE SODIUM PHOSPHATE 10 MG/ML IJ SOLN
INTRAMUSCULAR | Status: AC
  Filled 2018-02-24: qty 1

## 2018-02-24 MED ORDER — ROCURONIUM BROMIDE 100 MG/10ML IV SOLN
INTRAVENOUS | Status: DC | PRN
  Administered 2018-02-24: 5 mg via INTRAVENOUS

## 2018-02-24 MED ORDER — SUGAMMADEX SODIUM 200 MG/2ML IV SOLN
INTRAVENOUS | Status: AC
  Filled 2018-02-24: qty 2

## 2018-02-24 MED ORDER — LACTATED RINGERS IV SOLN
INTRAVENOUS | Status: DC
  Administered 2018-02-24: 13:00:00 via INTRAVENOUS

## 2018-02-24 MED ORDER — DEXAMETHASONE SODIUM PHOSPHATE 10 MG/ML IJ SOLN
INTRAMUSCULAR | Status: DC | PRN
  Administered 2018-02-24: 5 mg via INTRAVENOUS

## 2018-02-24 MED ORDER — LIDOCAINE HCL URETHRAL/MUCOSAL 2 % EX GEL
CUTANEOUS | Status: AC
  Filled 2018-02-24: qty 10

## 2018-02-24 MED ORDER — MIDAZOLAM HCL 2 MG/2ML IJ SOLN
INTRAMUSCULAR | Status: DC | PRN
  Administered 2018-02-24: 2 mg via INTRAVENOUS

## 2018-02-24 MED ORDER — SUGAMMADEX SODIUM 200 MG/2ML IV SOLN
INTRAVENOUS | Status: DC | PRN
  Administered 2018-02-24: 200 mg via INTRAVENOUS

## 2018-02-24 MED ORDER — MIDAZOLAM HCL 2 MG/2ML IJ SOLN
INTRAMUSCULAR | Status: AC
  Filled 2018-02-24: qty 2

## 2018-02-24 MED ORDER — FAMOTIDINE 20 MG PO TABS
ORAL_TABLET | ORAL | Status: AC
  Administered 2018-02-24: 20 mg via ORAL
  Filled 2018-02-24: qty 1

## 2018-02-24 MED ORDER — ONDANSETRON HCL 4 MG/2ML IJ SOLN: 4.0000 mg | Freq: Once | INTRAMUSCULAR | Status: DC | PRN

## 2018-02-24 MED ORDER — FENTANYL CITRATE (PF) 100 MCG/2ML IJ SOLN
INTRAMUSCULAR | Status: AC
  Filled 2018-02-24: qty 2

## 2018-02-24 MED ORDER — ONDANSETRON HCL 4 MG/2ML IJ SOLN
INTRAMUSCULAR | Status: DC | PRN
  Administered 2018-02-24: 4 mg via INTRAVENOUS

## 2018-02-24 MED ORDER — FENTANYL CITRATE (PF) 100 MCG/2ML IJ SOLN: 25.0000 ug | INTRAMUSCULAR | Status: DC | PRN

## 2018-02-24 MED ORDER — PHENYLEPHRINE HCL 10 MG/ML IJ SOLN
INTRAMUSCULAR | Status: DC | PRN
  Administered 2018-02-24 (×3): 100 ug via INTRAVENOUS

## 2018-02-24 MED ORDER — CEFAZOLIN SODIUM-DEXTROSE 1-4 GM/50ML-% IV SOLN
INTRAVENOUS | Status: AC
  Filled 2018-02-24: qty 50

## 2018-02-24 MED ORDER — IOTHALAMATE MEGLUMINE 43 % IV SOLN
INTRAVENOUS | Status: DC | PRN
  Administered 2018-02-24: 22 mL via SURGICAL_CAVITY

## 2018-02-24 MED ORDER — PROPOFOL 10 MG/ML IV BOLUS
INTRAVENOUS | Status: AC
  Filled 2018-02-24: qty 20

## 2018-02-24 MED ORDER — FENTANYL CITRATE (PF) 100 MCG/2ML IJ SOLN
INTRAMUSCULAR | Status: DC | PRN
  Administered 2018-02-24: 50 ug via INTRAVENOUS

## 2018-02-24 SURGICAL SUPPLY — 22 items
BAG DRAIN CYSTO-URO LG1000N (MISCELLANEOUS) ×2 IMPLANT
BRUSH SCRUB EZ 1% IODOPHOR (MISCELLANEOUS) ×2 IMPLANT
CATH URETL 5X70 OPEN END (CATHETERS) ×2 IMPLANT
CNTNR SPEC 2.5X3XGRAD LEK (MISCELLANEOUS)
CONRAY 43 FOR UROLOGY 50M (MISCELLANEOUS) ×2 IMPLANT
CONT SPEC 4OZ STER OR WHT (MISCELLANEOUS)
CONTAINER SPEC 2.5X3XGRAD LEK (MISCELLANEOUS) IMPLANT
FIBER LASER 365 (Laser) IMPLANT
GLOVE BIO SURGEON STRL SZ7.5 (GLOVE) ×2 IMPLANT
GOWN STRL REUS W/ TWL LRG LVL3 (GOWN DISPOSABLE) ×1 IMPLANT
GOWN STRL REUS W/ TWL XL LVL3 (GOWN DISPOSABLE) ×1 IMPLANT
GOWN STRL REUS W/TWL LRG LVL3 (GOWN DISPOSABLE) ×1
GOWN STRL REUS W/TWL XL LVL3 (GOWN DISPOSABLE) ×1
GUIDEWIRE STR ZIPWIRE 035X150 (MISCELLANEOUS) ×2 IMPLANT
KIT TURNOVER CYSTO (KITS) ×2 IMPLANT
PACK CYSTO AR (MISCELLANEOUS) ×2 IMPLANT
SET CYSTO W/LG BORE CLAMP LF (SET/KITS/TRAYS/PACK) ×2 IMPLANT
SOL .9 NS 3000ML IRR  AL (IV SOLUTION) ×1
SOL .9 NS 3000ML IRR UROMATIC (IV SOLUTION) ×1 IMPLANT
SURGILUBE 2OZ TUBE FLIPTOP (MISCELLANEOUS) ×2 IMPLANT
SYR 10ML LL (SYRINGE) ×2 IMPLANT
WATER STERILE IRR 1000ML POUR (IV SOLUTION) ×2 IMPLANT

## 2018-02-24 NOTE — H&P (Signed)
Date of Initial H&P: 02/23/18  History reviewed, patient examined, no change in status, stable for surgery.

## 2018-02-24 NOTE — Anesthesia Post-op Follow-up Note (Signed)
Anesthesia QCDR form completed.        

## 2018-02-24 NOTE — OR Nursing (Signed)
Discharge instructions discussed with pt and girlfriend. Both voice understanding.

## 2018-02-24 NOTE — Anesthesia Procedure Notes (Signed)
Procedure Name: Intubation Date/Time: 02/24/2018 2:44 PM Performed by: Justus Memory, CRNA Pre-anesthesia Checklist: Patient identified, Patient being monitored, Timeout performed, Emergency Drugs available and Suction available Patient Re-evaluated:Patient Re-evaluated prior to induction Oxygen Delivery Method: Circle system utilized Preoxygenation: Pre-oxygenation with 100% oxygen Induction Type: IV induction Ventilation: Mask ventilation without difficulty Laryngoscope Size: Mac and 3 Grade View: Grade I Tube type: Oral Tube size: 7.0 mm Number of attempts: 1 Airway Equipment and Method: Stylet Placement Confirmation: ETT inserted through vocal cords under direct vision,  positive ETCO2 and breath sounds checked- equal and bilateral Secured at: 21 cm Tube secured with: Tape Dental Injury: Teeth and Oropharynx as per pre-operative assessment

## 2018-02-24 NOTE — Anesthesia Preprocedure Evaluation (Signed)
Anesthesia Evaluation  Patient identified by MRN, date of birth, ID band Patient awake    Reviewed: Allergy & Precautions, H&P , NPO status , Patient's Chart, lab work & pertinent test results, reviewed documented beta blocker date and time   History of Anesthesia Complications Negative for: history of anesthetic complications  Airway Mallampati: III  TM Distance: >3 FB Neck ROM: full    Dental  (+) Poor Dentition   Pulmonary sleep apnea , Current Smoker,    Pulmonary exam normal breath sounds clear to auscultation       Cardiovascular Exercise Tolerance: Good hypertension, (-) angina+ CAD, + Past MI, + Cardiac Stents and +CHF  Normal cardiovascular exam Rhythm:regular Rate:Normal     Neuro/Psych negative neurological ROS  negative psych ROS   GI/Hepatic negative GI ROS, Neg liver ROS,   Endo/Other  negative endocrine ROS  Renal/GU Renal diseasenegative Renal ROS  negative genitourinary   Musculoskeletal   Abdominal   Peds  Hematology negative hematology ROS (+)   Anesthesia Other Findings Past Medical History:   Varicose veins                                               No pertinent past medical history                            Hypertension                                                 Chronic kidney disease                                         Comment:stone   Sleep apnea                                                  Sleep apnea                                                    Comment:to have sleep study   Coronary artery disease                                      MI (myocardial infarction)                      06/19/2012     Comment:being considered for defibilator   Reproductive/Obstetrics negative OB ROS                             Anesthesia Physical  Anesthesia Plan  ASA: IV  Anesthesia Plan:    Post-op Pain Management:    Induction:   PONV Risk  Score and Plan:   Airway Management Planned: Oral ETT  Additional Equipment:   Intra-op Plan:   Post-operative Plan: Extubation in OR  Informed Consent: I have reviewed the patients History and Physical, chart, labs and discussed the procedure including the risks, benefits and alternatives for the proposed anesthesia with the patient or authorized representative who has indicated his/her understanding and acceptance.   Dental Advisory Given and Dental advisory given  Plan Discussed with: Anesthesiologist, CRNA and Surgeon  Anesthesia Plan Comments: (Patient informed that he is higher risk for medical compliacionts during this procedure due to his medical history.  Patient voiced understanding. )        Anesthesia Quick Evaluation

## 2018-02-24 NOTE — Discharge Instructions (Addendum)
Kidney Stones Kidney stones (urolithiasis) are rock-like masses that form inside of the kidneys. Kidneys are organs that make pee (urine). A kidney stone can cause very bad pain and can block the flow of pee. The stone usually leaves your body (passes) through your pee. You may need to have a doctor take out the stone. Follow these instructions at home: Eating and drinking  Drink enough fluid to keep your pee clear or pale yellow. This will help you pass the stone.  If told by your doctor, change the foods you eat (your diet). This may include: ? Limiting how much salt (sodium) you eat. ? Eating more fruits and vegetables. ? Limiting how much meat, poultry, fish, and eggs you eat.  Follow instructions from your doctor about eating or drinking restrictions. General instructions  Collect pee samples as told by your doctor. You may need to collect a pee sample: ? 24 hours after a stone comes out. ? 8-12 weeks after a stone comes out, and every 6-12 months after that.  Strain your pee every time you pee (urinate), for as long as told. Use the strainer that your doctor recommends.  Do not throw out the stone. Keep it so that it can be tested by your doctor.  Take over-the-counter and prescription medicines only as told by your doctor.  Keep all follow-up visits as told by your doctor. This is important. You may need follow-up tests. Preventing kidney stones To prevent another kidney stone:  Drink enough fluid to keep your pee clear or pale yellow. This is the best way to prevent kidney stones.  Eat healthy foods.  Avoid certain foods as told by your doctor. You may be told to eat less protein.  Stay at a healthy weight.  Contact a doctor if:  You have pain that gets worse or does not get better with medicine. Get help right away if:  You have a fever or chills.  You get very bad pain.  You get new pain in your belly (abdomen).  You pass out (faint).  You cannot pee. This  information is not intended to replace advice given to you by your health care provider. Make sure you discuss any questions you have with your health care provider. Document Released: 12/04/2007 Document Revised: 03/05/2016 Document Reviewed: 03/05/2016 Elsevier Interactive Patient Education  2017 Elsevier Inc.  AMBULATORY SURGERY  DISCHARGE INSTRUCTIONS   1) The drugs that you were given will stay in your system until tomorrow so for the next 24 hours you should not:  A) Drive an automobile B) Make any legal decisions C) Drink any alcoholic beverage   2) You may resume regular meals tomorrow.  Today it is better to start with liquids and gradually work up to solid foods.  You may eat anything you prefer, but it is better to start with liquids, then soup and crackers, and gradually work up to solid foods.   3) Please notify your doctor immediately if you have any unusual bleeding, trouble breathing, redness and pain at the surgery site, drainage, fever, or pain not relieved by medication.    4) Additional Instructions:        Please contact your physician with any problems or Same Day Surgery at [phone], Monday through Friday 6 am to 4 pm, or [place] at [facility] Main number at [phone].

## 2018-02-24 NOTE — Anesthesia Procedure Notes (Signed)
Performed by: Serria Sloma, CRNA       

## 2018-02-24 NOTE — Op Note (Signed)
Preoperative diagnosis: Right ureterolithiasis  Postoperative diagnosis: Passed right renal calculus  Procedure: 1.  Right ureteroscopy                      2.  Right retrograde pyelography                      3.  Fluoroscopy  Surgeon: Suszanne ConnersMichael R. Evelene CroonWolff MD  Anesthesia: General  Indications:See the history and physical. After informed consent the above procedure(s) were requested     Technique and findings: After adequate general anesthesia been obtained the patient was placed into dorsal lithotomy position and the perineum was prepped and draped in the usual fashion.  The 4.5 semirigid ureteroscope was coupled the camera and then visually advanced into the bladder.  Bladder was acted and no lesions were identified.  Right ureteral orifice was identified and appeared to be dilated.  Ureteroscope was then advanced into the right ureter and easily passed up to the proximal right ureter.  No ureteral calculi or filling defects were seen.  Some erythema and edema in the region of the distal right ureter consistent with recent passed urinary stone.  Contrast was then injected through the ureteroscope and the remaining proximal collecting system was visualized.  No filling defects or stones were identified.  Drainage fluoroscopy revealed excellent drainage of all contrast without evidence of hydronephrosis.  At this point the ureteroscope was removed.  Seizure was then terminated and patient transferred to the recovery room in stable condition.

## 2018-02-24 NOTE — Transfer of Care (Signed)
Immediate Anesthesia Transfer of Care Note  Patient: [name] Walker  Procedure(s) Performed: URETEROSCOPY (Right ) CYSTOSCOPY/RETROGRADE/URETEROSCOPY (Right )  Patient Location: PACU  Anesthesia Type:General  Level of Consciousness: drowsy and patient cooperative  Airway & Oxygen Therapy: Patient Spontanous Breathing and Patient connected to face mask oxygen  Post-op Assessment: Report given to RN and Post -op Vital signs reviewed and stable  Post vital signs: Reviewed and stable  Last Vitals:  Vitals Value Taken Time  BP 96/57 02/24/2018  3:18 PM  Temp 36.3 C 02/24/2018  3:18 PM  Pulse 71 02/24/2018  3:23 PM  Resp 14 02/24/2018  3:23 PM  SpO2 100 % 02/24/2018  3:23 PM  Vitals shown include unvalidated device data.  Last Pain:  Vitals:   02/24/18 1518  TempSrc:   PainSc: Asleep         Complications: No apparent anesthesia complications

## 2018-02-24 NOTE — H&P (Signed)
NAME: Sheila OatsERRY, Cornelis D. MEDICAL RECORD [id number] ACCOUNT [id number] DATE OF BIRTH:31-Jul-1955 FACILITY: [facility] LOCATION: [facility] PHYSICIAN:Jessah Danser Gilles Chiquito. Tramaine Snell, MD  HISTORY AND PHYSICAL  DATE OF ADMISSION:  02/24/2018  CHIEF COMPLAINT:  Right flank pain.  HISTORY OF PRESENT ILLNESS:  The patient is a 62 year old white male with sudden onset of severe right flank pain associated with nausea and vomiting on 02/22/2018.  A KUB in the office revealed 2 distal right stones measuring 6 mm x 6 mm each.  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  He comes in now for right ureteroscopic ureterolithotomy with holmium laser lithotripsy.  ALLERGIES:  No drug allergies.  CURRENT MEDICATIONS:  Atorvastatin, carvedilol, Entresto, aspirin, albuterol and tamsulosin.  He also has a prescription for Percocet as needed and Zofran as needed.  PAST SURGICAL HISTORY:  Lithotripsy 2016.  Ureteral stent placed in 2016,  coronary artery stent placement in 2013, ureteroscopic ureterolithotomy 2018.  PAST AND CURRENT MEDICAL CONDITIONS: 1.  Coronary artery disease. 2.  Chronic renal disease. 3.  Recurrent kidney stone disease. 4.  Erectile dysfunction.  REVIEW OF SYSTEMS:  The patient denies chest pain, shortness of breath, diabetes or stroke.  PHYSICAL EXAMINATION: GENERAL:  Well-nourished white male in no acute distress. HEENT:  Sclerae were clear.  Pupils were equally round, reactive to light and accommodation. NECK:  No palpable cervical adenopathy.  No audible carotid bruits. PULMONARY:  Lungs are clear to auscultation. CARDIOVASCULAR:  Regular rhythm and rate. ABDOMEN:  Soft, nontender abdomen. GENITOURINARY AND RECTAL:  Deferred.  NEUROMUSCULAR:  Alert and oriented x3.  IMPRESSION:  Right ureterolithiasis with renal colic.  PLAN:  Right ureteroscopic ureterolithotomy with holmium laser lithotripsy.  AN/NUANCE  D:02/23/2018  T:02/23/2018 JOB:[id number]

## 2018-02-25 ENCOUNTER — Encounter: Payer: Self-pay | Admitting: Urology

## 2018-02-25 NOTE — Anesthesia Postprocedure Evaluation (Signed)
Anesthesia Post Note  Patient: Sheila OatsBobby D Weissmann  Procedure(s) Performed: URETEROSCOPY (Right ) CYSTOSCOPY/RETROGRADE/URETEROSCOPY (Right )  Patient location during evaluation: PACU Anesthesia Type: General Level of consciousness: awake and alert and oriented Pain management: pain level controlled Vital Signs Assessment: post-procedure vital signs reviewed and stable Respiratory status: spontaneous breathing Cardiovascular status: blood pressure returned to baseline Anesthetic complications: no     Last Vitals:  Vitals:   02/24/18 1551 02/24/18 1557  BP: 101/65 (!) 101/57  Pulse: 64   Resp: 18   Temp: (!) 36.1 C   SpO2: 99%     Last Pain:  Vitals:   02/25/18 0911  TempSrc:   PainSc: 0-No pain                 Buford Bremer

## 2018-03-18 DIAGNOSIS — N5201 Erectile dysfunction due to arterial insufficiency: Secondary | ICD-10-CM | POA: Diagnosis not present

## 2018-03-18 DIAGNOSIS — N401 Enlarged prostate with lower urinary tract symptoms: Secondary | ICD-10-CM | POA: Diagnosis not present

## 2018-03-18 DIAGNOSIS — N201 Calculus of ureter: Secondary | ICD-10-CM | POA: Diagnosis not present

## 2018-11-19 ENCOUNTER — Other Ambulatory Visit: Payer: Self-pay | Admitting: Nurse Practitioner

## 2019-01-20 ENCOUNTER — Encounter: Payer: Self-pay | Admitting: Cardiology

## 2019-01-20 ENCOUNTER — Other Ambulatory Visit: Payer: Self-pay

## 2019-01-20 ENCOUNTER — Telehealth: Payer: Self-pay | Admitting: Cardiology

## 2019-01-20 ENCOUNTER — Ambulatory Visit (INDEPENDENT_AMBULATORY_CARE_PROVIDER_SITE_OTHER): Payer: Self-pay | Admitting: Cardiology

## 2019-01-20 VITALS — BP 116/70 | HR 56 | Ht 71.0 in | Wt 220.0 lb

## 2019-01-20 DIAGNOSIS — E785 Hyperlipidemia, unspecified: Secondary | ICD-10-CM

## 2019-01-20 DIAGNOSIS — Z720 Tobacco use: Secondary | ICD-10-CM

## 2019-01-20 DIAGNOSIS — Z79899 Other long term (current) drug therapy: Secondary | ICD-10-CM

## 2019-01-20 DIAGNOSIS — I5022 Chronic systolic (congestive) heart failure: Secondary | ICD-10-CM

## 2019-01-20 DIAGNOSIS — I2510 Atherosclerotic heart disease of native coronary artery without angina pectoris: Secondary | ICD-10-CM

## 2019-01-20 DIAGNOSIS — I255 Ischemic cardiomyopathy: Secondary | ICD-10-CM

## 2019-01-20 NOTE — Telephone Encounter (Signed)

## 2019-01-20 NOTE — Progress Notes (Signed)
01/20/2019 GUNTER CONDE   08-05-1955  [id number]  Primary Physician Tracie Harrier, MD Primary Cardiologist: Dr. Tamala Julian  Electrophysiologist: None   Reason for Visit/CC: F/u for Chronic Systolic CHF  HPI:  63 y/o male, followed by Dr. Tamala Julian, presenting to clinic today for routine f/u. He has a history of known CAD, ischemic CM with EF less than 17%, chronic systolic HF, ongoing tobacco abuse, HTN and OSA. He has a h/o anterior MI in 2013 2/2 occluded proximal LAD that was treated w/ PCI + stent. Last cardiac cath in 2016 showed patent LAD stent but the first diagonal that is partially jailed by the stent contains ostial 50% narrowing. The LAD distal to the stent contains eccentric 40% narrowing. EF was 35-30% at time of last cath. His last echo in 2017 showed persistently low LVEF at 30-35%, G1DD and mild MR. He was previously referred to Dr. Caryl Comes for ICD but pt declined, despite recommendation. Last seen in clinic 12/2017 and was doing well w/o any cardiac symptoms. Lipid panel 12/2017 showed controlled LDL at 59 mg/dL.   Today he reports that he has done well since last OV. No hospitalizations, ER visits or surgeries over the last year. No cardiac symptoms. Denies CP, dyspnea, palpitations, LEE, orthopnea, PND, syncope/ near syncope. Fully compliant w/ meds. Still smokes ~1/4 ppd.     Current Meds  Medication Sig  . acetaminophen (TYLENOL) 500 MG tablet Take 1,000 mg by mouth every 6 (six) hours as needed for moderate pain or headache.   Marland Kitchen atorvastatin (LIPITOR) 40 MG tablet Take 1 tablet (40 mg total) by mouth daily.  . carvedilol (COREG) 12.5 MG tablet Take 1 tablet (12.5 mg total) by mouth 2 (two) times daily. Please keep appt to continue receiving refills.  . nitroGLYCERIN (NITROSTAT) 0.4 MG SL tablet Place 1 tablet (0.4 mg total) under the tongue every 5 (five) minutes x 3 doses as needed for chest pain.  . sacubitril-valsartan (ENTRESTO) 24-26 MG Take 1 tablet by mouth 2 (two) times  daily. Please keep appt to continue getting refills.  . tamsulosin (FLOMAX) 0.4 MG CAPS capsule Take 1 capsule (0.4 mg total) by mouth daily.   Allergies  Allergen Reactions  . Nucynta [Tapentadol] Nausea Only and Other (See Comments)    Dizziness, woozy.  Sick to his stomach  . Codeine Other (See Comments)    Too drowsy   Past Medical History:  Diagnosis Date  . Bronchitis    CHRONIC  . Chronic kidney disease    stone  . Coronary artery disease 2013   BMS to LAD  . Dysrhythmia 04/01/2017   ekg showed changes from previous ekgs. cleared for surgery by cardiology  . Erectile dysfunction   . History of kidney stones   . HOH (hard of hearing)   . Hypercholesterolemia   . Hypertension   . Ischemic cardiomyopathy    EF 25-30%   . MI (myocardial infarction) ([place]) 06/19/2012   being considered for defibilator but patient now refuses this.  . NSTEMI (non-ST elevated myocardial infarction) ([place]) 03/2015   Patent BMS - LAD stent.  . Sleep apnea   . Varicose veins    Family History  Problem Relation Age of Onset  . Cancer Father   . Heart murmur Mother   . Heart attack Brother    Past Surgical History:  Procedure Laterality Date  . CARDIAC CATHETERIZATION N/A 01/09/2015   Procedure: Left Heart Cath and Coronary Angiography;  Surgeon: Belva Crome, MD;  Location: [facility] INVAS[facility] CV LAB;  Service: Cardiovascular;  Laterality: N/A;  . CARDIAC CATHETERIZATION N/A 03/13/2015   Procedure: Left Heart Cath and Coronary Angiography;  Surgeon: Lyn RecordsHenry W Smith, MD;  [facility];  Service: Cardiovascular;  Laterality: N/A;  . CORONARY ANGIOPLASTY     STENT  . CORONARY STENT PLACEMENT    . CYSTOSCOPY W/ URETERAL STENT REMOVAL Right 05/13/2017   Procedure: CYSTOSCOPY WITH STENT REMOVAL;  Surgeon: Orson ApeWolff, Michael R, MD;  Location: [facility] ORS;  Service: Urology;  Laterality: Right;  . CYSTOSCOPY WITH STENT PLACEMENT Right 03/07/2015   Procedure: CYSTOSCOPY WITH STENT PLACEMENT;  Surgeon:  Orson ApeMichael R Wolff, MD;  Location: [facility] ORS;  Service: Urology;  Laterality: Right;  . CYSTOSCOPY WITH STENT PLACEMENT Right 03/25/2017   Procedure: CYSTOSCOPY WITH STENT PLACEMENT;  Surgeon: Orson ApeWolff, Michael R, MD;  Location: [facility] ORS;  Service: Urology;  Laterality: Right;  . CYSTOSCOPY/RETROGRADE/URETEROSCOPY Right 02/24/2018   Procedure: CYSTOSCOPY/RETROGRADE/URETEROSCOPY;  Surgeon: Orson ApeWolff, Michael R, MD;  Location: [facility] ORS;  Service: Urology;  Laterality: Right;  . CYSTOSCOPY/RETROGRADE/URETEROSCOPY/STONE EXTRACTION WITH BASKET Right 03/25/2017   Procedure: CYSTOSCOPY/RETROGRADE/URETEROSCOPY/STONE EXTRACTION WITH BASKET;  Surgeon: Orson ApeWolff, Michael R, MD;  Location: [facility] ORS;  Service: Urology;  Laterality: Right;  . EXTRACORPOREAL SHOCK WAVE LITHOTRIPSY Right 03/09/2015   Procedure: EXTRACORPOREAL SHOCK WAVE LITHOTRIPSY (ESWL);  Surgeon: Orson ApeMichael R Wolff, MD;  Location: [facility] ORS;  Service: Urology;  Laterality: Right;  . EXTRACORPOREAL SHOCK WAVE LITHOTRIPSY Right 05/11/2015   Procedure: EXTRACORPOREAL SHOCK WAVE LITHOTRIPSY (ESWL);  Surgeon: Orson ApeMichael R Wolff, MD;  Location: [facility] ORS;  Service: Urology;  Laterality: Right;  . EXTRACORPOREAL SHOCK WAVE LITHOTRIPSY Right 04/10/2017   Procedure: EXTRACORPOREAL SHOCK WAVE LITHOTRIPSY (ESWL);  Surgeon: Orson ApeWolff, Michael R, MD;  Location: [facility] ORS;  Service: Urology;  Laterality: Right;  . LEFT HEART CATHETERIZATION WITH CORONARY ANGIOGRAM Right 06/19/2012   Procedure: LEFT HEART CATHETERIZATION WITH CORONARY ANGIOGRAM;  Surgeon: Lesleigh NoeHenry W Smith III, MD;  [facility];  Service: Cardiovascular;  Laterality: Right;  . PERCUTANEOUS CORONARY STENT INTERVENTION (PCI-S) Right 06/19/2012   Procedure: PERCUTANEOUS CORONARY STENT INTERVENTION (PCI-S);  Surgeon: Lesleigh NoeHenry W Smith III, MD;  [facility];  Service: Cardiovascular;  Laterality: Right;  . URETEROSCOPY WITH HOLMIUM LASER LITHOTRIPSY Right 02/24/2018   Procedure: URETEROSCOPY;  Surgeon: Orson ApeWolff, Michael  R, MD;  Location: [facility] ORS;  Service: Urology;  Laterality: Right;   Social History   Socioeconomic History  . Marital status: Single    Spouse name: Not on file  . Number of children: Not on file  . Years of education: Not on file  . Highest education level: Not on file  Occupational History  . Not on file  Social Needs  . Financial resource strain: Not on file  . Food insecurity    Worry: Not on file    Inability: Not on file  . Transportation needs    Medical: Not on file    Non-medical: Not on file  Tobacco Use  . Smoking status: Current Every Day Smoker    Packs/day: 0.50    Years: 40.00    Pack years: 20.00    Types: Cigarettes  . Smokeless tobacco: Never Used  Substance and Sexual Activity  . Alcohol use: Yes    Alcohol/week: 0.0 standard drinks    Comment: occ  . Drug use: No  . Sexual activity: Not on file  Lifestyle  . Physical activity    Days per week: Not on file    Minutes  per session: Not on file  . Stress: Not on file  Relationships  . Social Musicianconnections    Talks on phone: Not on file    Gets together: Not on file    Attends religious service: Not on file    Active member of club or organization: Not on file    Attends meetings of clubs or organizations: Not on file    Relationship status: Not on file  . Intimate partner violence    Fear of current or ex partner: Not on file    Emotionally abused: Not on file    Physically abused: Not on file    Forced sexual activity: Not on file  Other Topics Concern  . Not on file  Social History Narrative  . Not on file     Lipid Panel     Component Value Date/Time   CHOL 124 01/28/2018 1530   TRIG 109 01/28/2018 1530   HDL 43 01/28/2018 1530   CHOLHDL 2.9 01/28/2018 1530   CHOLHDL 3.5 03/12/2015 0420   VLDL 24 03/12/2015 0420   LDLCALC 59 01/28/2018 1530    Review of Systems: General: negative for chills, fever, night sweats or weight changes.  Cardiovascular: negative for chest pain, dyspnea  on exertion, edema, orthopnea, palpitations, paroxysmal nocturnal dyspnea or shortness of breath Dermatological: negative for rash Respiratory: negative for cough or wheezing Urologic: negative for hematuria Abdominal: negative for nausea, vomiting, diarrhea, bright red blood per rectum, melena, or hematemesis Neurologic: negative for visual changes, syncope, or dizziness All other systems reviewed and are otherwise negative except as noted above.   Physical Exam:  Blood pressure 116/70, pulse (!) 56, height 5\' 11"  (1.803 m), weight 220 lb (99.8 kg), SpO2 96 %.  General appearance: alert, cooperative, no distress and moderately obese Neck: no carotid bruit and no JVD Lungs: clear to auscultation bilaterally Heart: regular rate and rhythm, S1, S2 normal, no murmur, click, rub or gallop Extremities: extremities normal, atraumatic, no cyanosis or edema Pulses: 2+ and symmetric Skin: Skin color, texture, turgor normal. No rashes or lesions  EKG sinus brady 56 bpm, slight lateral TW inversions/flattening, similar to prior EKGs  -- personally reviewed   ASSESSMENT AND PLAN:   1. CAD: H/o anterior MI treated w/ LAD stent. Patent stent on last cath in 2016 with 50% 1st diag. Stable w/o CP. Continue medical therapy w/ ASA 81 mg,  blocker and statin.   2. Ischemic CM/ Chronic Systolic HF: EF persistently low since anterior MI several years ago. Last echo in 2017 showed EF of 30-35%. An ICD was recommended for  prevention of SCD but he had declined. We discussed this again today and he still declines. He is on medical therapy w/ Entresto and Coreg. Tolerating well. BP and HR controlled. Euvolemic on exam. No symptoms. No changes made to regimen today.    3. HLD:  LDL goal < 70 mg/dL given CAD. His last lipid panel 12/2017 showed controlled LDL at 59 mg/dL. He is due for repeat panel. Will order along w/ HFTs. Continue statin.   4. Tobacco Abuse: smoking cessation advised. I offered cessation aide  but he is not interested. He will try to gradually wean off on his own.    Follow-Up w/ Dr. Katrinka BlazingSmith in 1 year.    Delmer IslamSimmons PA-C, [facility] 01/20/2019 2:01 PM

## 2019-01-20 NOTE — Patient Instructions (Signed)
Medication Instructions:  none If you need a refill on your cardiac medications before your next appointment, please call your pharmacy.   Lab work: This week or early next week (PLEASE SCHEDULE) BMET FLP HFT If you have labs (blood work) drawn today and your tests are completely normal, you will receive your results only by: Marland Kitchen MyChart Message (if you have MyChart) OR . A paper copy in the mail If you have any lab test that is abnormal or we need to change your treatment, we will call you to review the results.  Testing/Procedures: NONE  Follow-Up: At [facility], you and your health needs are our priority.  As part of our continuing mission to provide you with exceptional heart care, we have created designated Provider Care Teams.  These Care Teams include your primary Cardiologist (physician) and Advanced Practice Providers (APPs -  Physician Assistants and Nurse Practitioners) who all work together to provide you with the care you need, when you need it. You will need a follow up appointment in 1 years.  Please call our office 2 months in advance to schedule this appointment.  You may see Dr Tamala Julian or one of the following Advanced Practice Providers on your designated Care Team:   Truitt Merle, NP Cecilie Kicks, NP . Kathyrn Drown, NP  Any Other Special Instructions Will Be Listed Below (If Applicable).

## 2019-01-22 ENCOUNTER — Other Ambulatory Visit: Payer: Self-pay | Admitting: Nurse Practitioner

## 2019-02-20 ENCOUNTER — Other Ambulatory Visit: Payer: Self-pay | Admitting: Nurse Practitioner

## 2019-06-04 DIAGNOSIS — N5201 Erectile dysfunction due to arterial insufficiency: Secondary | ICD-10-CM | POA: Diagnosis not present

## 2019-06-04 DIAGNOSIS — N401 Enlarged prostate with lower urinary tract symptoms: Secondary | ICD-10-CM | POA: Diagnosis not present

## 2019-06-04 DIAGNOSIS — E669 Obesity, unspecified: Secondary | ICD-10-CM | POA: Diagnosis not present

## 2019-06-28 ENCOUNTER — Other Ambulatory Visit: Payer: Self-pay

## 2019-06-28 MED ORDER — ENTRESTO 24-26 MG PO TABS: 1.0000 | ORAL_TABLET | Freq: Two times a day (BID) | ORAL | 1 refills | Status: DC

## 2019-09-18 ENCOUNTER — Ambulatory Visit: Payer: BC Managed Care – PPO | Attending: Internal Medicine

## 2019-09-18 DIAGNOSIS — Z23 Encounter for immunization: Secondary | ICD-10-CM

## 2019-09-18 NOTE — Progress Notes (Signed)
   Covid-19 Vaccination Clinic  Name:  Excell Neyland    MRN: [id number] DOB: July 27, 1955  09/18/2019  Mr. Selman was observed post Covid-19 immunization for 15 minutes without incident. He was provided with Vaccine Information Sheet and instruction to access the V-Safe system.   Mr. Crampton was instructed to call 911 with any severe reactions post vaccine: Marland Kitchen Difficulty breathing  . Swelling of face and throat  . A fast heartbeat  . A bad rash all over body  . Dizziness and weakness   Immunizations Administered    Name Date Dose VIS Date Route   Pfizer COVID-19 Vaccine 09/18/2019 11:09 AM 0.3 mL 06/11/2019 Intramuscular   Manufacturer: [employer], [employer]   Lot: [id number]   NDC: [address]-6

## 2019-10-19 ENCOUNTER — Ambulatory Visit: Payer: BC Managed Care – PPO | Attending: Internal Medicine

## 2019-10-19 DIAGNOSIS — Z23 Encounter for immunization: Secondary | ICD-10-CM

## 2019-10-19 NOTE — Progress Notes (Signed)
   Covid-19 Vaccination Clinic  Name:  [name] Walker    MRN: [id number] DOB: Nov 05, 1955  10/19/2019  [name] was observed post Covid-19 immunization for 15 minutes without incident. He was provided with Vaccine Information Sheet and instruction to access the V-Safe system.   [name] was instructed to call 911 with any severe reactions post vaccine: Marland Kitchen Difficulty breathing  . Swelling of face and throat  . A fast heartbeat  . A bad rash all over body  . Dizziness and weakness   Immunizations Administered    Name Date Dose VIS Date Route   Pfizer COVID-19 Vaccine 10/19/2019  3:48 PM 0.3 mL 08/25/2018 Intramuscular   Manufacturer: [employer], [employer]   Lot: [id number]   NDC: [address]-5

## 2019-11-27 ENCOUNTER — Other Ambulatory Visit: Payer: Self-pay | Admitting: Nurse Practitioner

## 2019-12-30 ENCOUNTER — Other Ambulatory Visit: Payer: Self-pay | Admitting: Interventional Cardiology

## 2019-12-31 MED ORDER — ENTRESTO 24-26 MG PO TABS: 1.0000 | ORAL_TABLET | Freq: Two times a day (BID) | ORAL | 0 refills | Status: DC

## 2020-01-21 ENCOUNTER — Telehealth: Payer: Self-pay | Admitting: Interventional Cardiology

## 2020-01-21 MED ORDER — ENTRESTO 24-26 MG PO TABS: 1.0000 | ORAL_TABLET | Freq: Two times a day (BID) | ORAL | 0 refills | Status: DC

## 2020-01-21 NOTE — Telephone Encounter (Signed)
Pt's medication was sent to pt's pharmacy as requested. Confirmation received.  °

## 2020-01-21 NOTE — Telephone Encounter (Signed)
*  STAT* If patient is at the pharmacy, call can be transferred to refill team.   1. Which medications need to be refilled? (please list name of each medication and dose if known) sacubitril-valsartan (ENTRESTO) 24-26 MG  2. Which pharmacy/location (including street and city if local pharmacy) is medication to be sent to? [facility] - [place], [place] - [address]  3. Do they need a 30 day or 90 day supply? 90 day supply  Patient states he has a 10 day supply remaining.

## 2020-01-25 ENCOUNTER — Other Ambulatory Visit: Payer: Self-pay | Admitting: Interventional Cardiology

## 2020-02-19 ENCOUNTER — Other Ambulatory Visit: Payer: Self-pay | Admitting: Interventional Cardiology

## 2020-03-22 NOTE — Progress Notes (Signed)
Cardiology Office Note:    Date:  03/24/2020   ID:  [name] Walker, DOB 27-Jun-1956, MRN [id number]  PCP:  [name] Reichmann, MD  Cardiologist:  [name] Noe, MD   Referring MD: [name] Reichmann, MD   Chief Complaint  Patient presents with  . Coronary Artery Disease  . Congestive Heart Failure    History of Present Illness:    [name] Walker is a 64 y.o. male with a hx of CAD, ischemic CM with EF less than 35%, chronic systolic HF, ongoing tobacco abuse, HTN and OSA. He has a h/o anterior MI in 2013 2/2 occluded proximal LAD that was treated w/ PCI + stent. Last cardiac cath in 2016 showed patent LAD stent but the first diagonal that is partially jailed by the stent contains ostial 50% narrowing.Declined ICD and last EF 40%.  [name] Walker is active.  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  He is not smoking.  He plays golf several times per month.  He mows his grass pushing a lawn more.  He denies orthopnea and PND.  No episodes of angina.  He is compliant with his medical regimen.  No blood work in 2 years.  Past Medical History:  Diagnosis Date  . Bronchitis    CHRONIC  . Chronic kidney disease    stone  . Coronary artery disease 2013   BMS to LAD  . Dysrhythmia 04/01/2017   ekg showed changes from previous ekgs. cleared for surgery by cardiology  . Erectile dysfunction   . History of kidney stones   . HOH (hard of hearing)   . Hypercholesterolemia   . Hypertension   . Ischemic cardiomyopathy    EF 25-30%   . MI (myocardial infarction) (HCC) 06/19/2012   being considered for defibilator but patient now refuses this.  . NSTEMI (non-ST elevated myocardial infarction) (HCC) 03/2015   Patent BMS - LAD stent.  . Sleep apnea   . Varicose veins     Past Surgical History:  Procedure Laterality Date  . CARDIAC CATHETERIZATION N/A 01/09/2015   Procedure: Left Heart Cath and Coronary Angiography;  Surgeon: [name] Records, MD;  Location: [facility] INVASIVE CV LAB;   Service: Cardiovascular;  Laterality: N/A;  . CARDIAC CATHETERIZATION N/A 03/13/2015   Procedure: Left Heart Cath and Coronary Angiography;  Surgeon: [name] Records, MD;  Location: [facility] INVASIVE CV LAB;  Service: Cardiovascular;  Laterality: N/A;  . CORONARY ANGIOPLASTY     STENT  . CORONARY STENT PLACEMENT    . CYSTOSCOPY W/ URETERAL STENT REMOVAL Right 05/13/2017   Procedure: CYSTOSCOPY WITH STENT REMOVAL;  Surgeon: [name] Ape, MD;  Location: [facility] ORS;  Service: Urology;  Laterality: Right;  . CYSTOSCOPY WITH STENT PLACEMENT Right 03/07/2015   Procedure: CYSTOSCOPY WITH STENT PLACEMENT;  Surgeon: [name] Ape, MD;  Location: [facility];  Service: Urology;  Laterality: Right;  . CYSTOSCOPY WITH STENT PLACEMENT Right 03/25/2017   Procedure: CYSTOSCOPY WITH STENT PLACEMENT;  Surgeon: [name] Ape, MD;  Location: [facility];  Service: Urology;  Laterality: Right;  . CYSTOSCOPY/RETROGRADE/URETEROSCOPY Right 02/24/2018   Procedure: CYSTOSCOPY/RETROGRADE/URETEROSCOPY;  Surgeon: [name] Ape, MD;  Location: [facility];  Service: Urology;  Laterality: Right;  . CYSTOSCOPY/RETROGRADE/URETEROSCOPY/STONE EXTRACTION WITH BASKET Right 03/25/2017   Procedure: CYSTOSCOPY/RETROGRADE/URETEROSCOPY/STONE EXTRACTION WITH BASKET;  Surgeon: [name] Ape, MD;  Location: [facility];  Service: Urology;  Laterality: Right;  . EXTRACORPOREAL SHOCK WAVE LITHOTRIPSY Right 03/09/2015   Procedure: EXTRACORPOREAL SHOCK WAVE LITHOTRIPSY (ESWL);  Surgeon: [name] Ape, MD;  Location: [facility];  Service: Urology;  Laterality: Right;  . EXTRACORPOREAL SHOCK WAVE LITHOTRIPSY Right 05/11/2015   Procedure: EXTRACORPOREAL SHOCK WAVE LITHOTRIPSY (ESWL);  Surgeon: [name] Ape, MD;  Location: [facility];  Service: Urology;  Laterality: Right;  . EXTRACORPOREAL SHOCK WAVE LITHOTRIPSY Right 04/10/2017   Procedure: EXTRACORPOREAL SHOCK WAVE LITHOTRIPSY (ESWL);  Surgeon: [name] Ape, MD;  Location: [facility];  Service: Urology;   Laterality: Right;  . LEFT HEART CATHETERIZATION WITH CORONARY ANGIOGRAM Right 06/19/2012   Procedure: LEFT HEART CATHETERIZATION WITH CORONARY ANGIOGRAM;  Surgeon: [name] Noe, MD;  Location: [facility] CATH LAB;  Service: Cardiovascular;  Laterality: Right;  . PERCUTANEOUS CORONARY STENT INTERVENTION (PCI-S) Right 06/19/2012   Procedure: PERCUTANEOUS CORONARY STENT INTERVENTION (PCI-S);  Surgeon: [name] Noe, MD;  Location: [facility] CATH LAB;  Service: Cardiovascular;  Laterality: Right;  . URETEROSCOPY WITH HOLMIUM LASER LITHOTRIPSY Right 02/24/2018   Procedure: URETEROSCOPY;  Surgeon: [name] Ape, MD;  Location: [facility];  Service: Urology;  Laterality: Right;    Current Medications: Current Meds  Medication Sig  . acetaminophen (TYLENOL) 500 MG tablet Take 1,000 mg by mouth every 6 (six) hours as needed for moderate pain or headache.   Marland Kitchen atorvastatin (LIPITOR) 40 MG tablet Take 1 tablet (40 mg total) by mouth daily. Please keep upcoming appt with Dr. Katrinka Blazing in September before anymore refills. Thank you  . carvedilol (COREG) 12.5 MG tablet Take 1 tablet (12.5 mg total) by mouth 2 (two) times daily. Please make yearly appt with Dr. Katrinka Blazing for July before anymore refills. 1st attempt  . nitroGLYCERIN (NITROSTAT) 0.4 MG SL tablet Place 1 tablet (0.4 mg total) under the tongue every 5 (five) minutes x 3 doses as needed for chest pain.  . sacubitril-valsartan (ENTRESTO) 24-26 MG Take 1 tablet by mouth 2 (two) times daily. Please keep upcoming appt in September with Dr. Katrinka Blazing before anymore refills. Thank you  . tamsulosin (FLOMAX) 0.4 MG CAPS capsule Take 1 capsule (0.4 mg total) by mouth daily.     Allergies:   Nucynta [tapentadol] and Codeine   Social History   Socioeconomic History  . Marital status: Single    Spouse name: Not on file  . Number of children: Not on file  . Years of education: Not on file  . Highest education level: Not on file  Occupational History  . Not on file    Tobacco Use  . Smoking status: Current Every Day Smoker    Packs/day: 0.50    Years: 40.00    Pack years: 20.00    Types: Cigarettes  . Smokeless tobacco: Never Used  Vaping Use  . Vaping Use: Never used  Substance and Sexual Activity  . Alcohol use: Yes    Alcohol/week: 0.0 standard drinks    Comment: occ  . Drug use: No  . Sexual activity: Not on file  Other Topics Concern  . Not on file  Social History Narrative  . Not on file   Social Determinants of Health   Financial Resource Strain:   . Difficulty of Paying Living Expenses: Not on file  Food Insecurity:   . Worried About Programme researcher, broadcasting/film/video in the Last Year: Not on file  . Ran Out of Food in the Last Year: Not on file  Transportation Needs:   . Lack of Transportation (Medical): Not on file  . Lack of Transportation (Non-Medical): Not on file  Physical Activity:   .  Days of Exercise per Week: Not on file  . Minutes of Exercise per Session: Not on file  Stress:   . Feeling of Stress : Not on file  Social Connections:   . Frequency of Communication with Friends and Family: Not on file  . Frequency of Social Gatherings with Friends and Family: Not on file  . Attends Religious Services: Not on file  . Active Member of Clubs or Organizations: Not on file  . Attends Banker Meetings: Not on file  . Marital Status: Not on file     Family History: The patient's family history includes Cancer in his father; Heart attack in his brother; Heart murmur in his mother.  ROS:   Please see the history of present illness.    He has been safe during the pandemic.  He has not had infection.  All other systems reviewed and are negative.  EKGs/Labs/Other Studies Reviewed:    The following studies were reviewed today: No recent imaging.  Last echo 2017.  EKG:  EKG sinus bradycardia, 59 bpm, QS pattern V1 through V3.  When compared to the prior tracing from July 2020, no significant changes noted.  Recent  Labs: No results found for requested labs within last 8760 hours.  Recent Lipid Panel    Component Value Date/Time   CHOL 124 01/28/2018 1530   TRIG 109 01/28/2018 1530   HDL 43 01/28/2018 1530   CHOLHDL 2.9 01/28/2018 1530   CHOLHDL 3.5 03/12/2015 0420   VLDL 24 03/12/2015 0420   LDLCALC 59 01/28/2018 1530    Physical Exam:    VS:  BP 118/68   Pulse (!) 59   Ht 5\' 11"  (1.803 m)   Wt 216 lb 12.8 oz (98.3 kg)   SpO2 97%   BMI 30.24 kg/m     Wt Readings from Last 3 Encounters:  03/24/20 216 lb 12.8 oz (98.3 kg)  01/20/19 220 lb (99.8 kg)  02/24/18 208 lb 1.6 oz (94.4 kg)     GEN: Slightly overweight. No acute distress HEENT: Normal NECK: No JVD. LYMPHATICS: No lymphadenopathy CARDIAC:  RRR without murmur, gallop, or edema. VASCULAR:  Normal Pulses. No bruits. RESPIRATORY:  Clear to auscultation without rales, wheezing or rhonchi  ABDOMEN: Soft, non-tender, non-distended, No pulsatile mass, MUSCULOSKELETAL: No deformity  SKIN: Warm and dry NEUROLOGIC:  Alert and oriented x 3 PSYCHIATRIC:  Normal affect   ASSESSMENT:    1. Coronary artery disease involving native coronary artery of native heart without angina pectoris   2. Hyperlipidemia, unspecified hyperlipidemia type   3. Chronic systolic heart failure (HCC)   4. Tobacco abuse   5. Educated about COVID-19 virus infection    PLAN:    In order of problems listed above:  1. Secondary prevention is discussed.  Blood work will be done today to evaluate lipids and to screen for evidence of glycemic problems. 2. He is on high intensity statin therapy but no lipid panel in quite some time.  Continue Lipitor 40 mg/day.  A liver panel and lipid will be done. 3. Needs 2D Doppler echocardiogram prior to our next office visit unless symptoms develop prior to that time.  Continue Entresto 26/24 mg twice daily and carvedilol 12.5 mg twice daily. 4. Encouraged to discontinue cigarette smoking. 5. He has been vaccinated with  mRNA.  He has not had Covid.  He is 02/26/18.  Overall education and awareness concerning primary/secondary risk prevention was discussed in detail: LDL less than 70, hemoglobin  A1c less than 7, blood pressure target less than 130/80 mmHg, >150 minutes of moderate aerobic activity per week, avoidance of smoking, weight control (via diet and exercise), and continued surveillance/management of/for obstructive sleep apnea.    Medication Adjustments/Labs and Tests Ordered: Current medicines are reviewed at length with the patient today.  Concerns regarding medicines are outlined above.  Orders Placed This Encounter  Procedures  . EKG 12-Lead   No orders of the defined types were placed in this encounter.   There are no Patient Instructions on file for this visit.   Signed, [name] NoeHenry W Chrisette Man III, MD  03/24/2020 3:36 PM    [facility]

## 2020-03-24 ENCOUNTER — Ambulatory Visit (INDEPENDENT_AMBULATORY_CARE_PROVIDER_SITE_OTHER): Payer: BC Managed Care – PPO | Admitting: Interventional Cardiology

## 2020-03-24 ENCOUNTER — Other Ambulatory Visit: Payer: Self-pay

## 2020-03-24 ENCOUNTER — Encounter: Payer: Self-pay | Admitting: Interventional Cardiology

## 2020-03-24 VITALS — BP 118/68 | HR 59 | Ht 71.0 in | Wt 216.8 lb

## 2020-03-24 DIAGNOSIS — I2510 Atherosclerotic heart disease of native coronary artery without angina pectoris: Secondary | ICD-10-CM | POA: Diagnosis not present

## 2020-03-24 DIAGNOSIS — E785 Hyperlipidemia, unspecified: Secondary | ICD-10-CM | POA: Diagnosis not present

## 2020-03-24 DIAGNOSIS — Z720 Tobacco use: Secondary | ICD-10-CM | POA: Diagnosis not present

## 2020-03-24 DIAGNOSIS — I5022 Chronic systolic (congestive) heart failure: Secondary | ICD-10-CM

## 2020-03-24 DIAGNOSIS — Z7189 Other specified counseling: Secondary | ICD-10-CM

## 2020-03-24 NOTE — Patient Instructions (Signed)
Medication Instructions:  Your physician recommends that you continue on your current medications as directed. Please refer to the Current Medication list given to you today.  *If you need a refill on your cardiac medications before your next appointment, please call your pharmacy*   Lab Work: BMET, Liver, Lipid, CBC and A1C today  If you have labs (blood work) drawn today and your tests are completely normal, you will receive your results only by: Marland Kitchen MyChart Message (if you have MyChart) OR . A paper copy in the mail If you have any lab test that is abnormal or we need to change your treatment, we will call you to review the results.   Testing/Procedures: Your physician has requested that you have an echocardiogram 1-2 weeks prior to seeing Dr. Katrinka Blazing back in a year. Echocardiography is a painless test that uses sound waves to create images of your heart. It provides your doctor with information about the size and shape of your heart and how well your heart's chambers and valves are working. This procedure takes approximately one hour. There are no restrictions for this procedure.     Follow-Up: At [facility], you and your health needs are our priority.  As part of our continuing mission to provide you with exceptional heart care, we have created designated Provider Care Teams.  These Care Teams include your primary Cardiologist (physician) and Advanced Practice Providers (APPs -  Physician Assistants and Nurse Practitioners) who all work together to provide you with the care you need, when you need it.  We recommend signing up for the patient portal called "MyChart".  Sign up information is provided on this After Visit Summary.  MyChart is used to connect with patients for Virtual Visits (Telemedicine).  Patients are able to view lab/test results, encounter notes, upcoming appointments, etc.  Non-urgent messages can be sent to your provider as well.   To learn more about what you can do  with MyChart, go to [email or web address].    Your next appointment:   12 month(s)  The format for your next appointment:   In Person  Provider:   You may see Lesleigh Noe, MD or one of the following Advanced Practice Providers on your designated Care Team:    Norma Fredrickson, NP  Nada Boozer, NP  Georgie Chard, NP    Other Instructions

## 2020-03-25 LAB — BASIC METABOLIC PANEL WITH GFR
BUN/Creatinine Ratio: 13 (ref 10–24)
BUN: 13 mg/dL (ref 8–27)
CO2: 25 mmol/L (ref 20–29)
Calcium: 9.9 mg/dL (ref 8.6–10.2)
Chloride: 103 mmol/L (ref 96–106)
Creatinine, Ser: 0.98 mg/dL (ref 0.76–1.27)
GFR calc Af Amer: 94 mL/min/1.73 (ref >59)
GFR calc non Af Amer: 82 mL/min/1.73 (ref >59)
Glucose: 90 mg/dL (ref 65–99)
Potassium: 3.8 mmol/L (ref 3.5–5.2)
Sodium: 139 mmol/L (ref 134–144)

## 2020-03-25 LAB — HEMOGLOBIN A1C
Est. average glucose Bld gHb Est-mCnc: 123 mg/dL
Hgb A1c MFr Bld: 5.9 % — ABNORMAL HIGH (ref 4.8–5.6)

## 2020-03-25 LAB — HEPATIC FUNCTION PANEL
ALT: 9 IU/L (ref 0–44)
AST: 11 IU/L (ref 0–40)
Albumin: 4.6 g/dL (ref 3.8–4.8)
Alkaline Phosphatase: 72 IU/L (ref 44–121)
Bilirubin Total: 0.8 mg/dL (ref 0.0–1.2)
Bilirubin, Direct: 0.21 mg/dL (ref 0.00–0.40)
Total Protein: 7 g/dL (ref 6.0–8.5)

## 2020-03-25 LAB — CBC
Hematocrit: 41.4 % (ref 37.5–51.0)
Hemoglobin: 14.8 g/dL (ref 13.0–17.7)
MCH: 33.9 pg — ABNORMAL HIGH (ref 26.6–33.0)
MCHC: 35.7 g/dL (ref 31.5–35.7)
MCV: 95 fL (ref 79–97)
Platelets: 174 x10E3/uL (ref 150–450)
RBC: 4.36 x10E6/uL (ref 4.14–5.80)
RDW: 12.8 % (ref 11.6–15.4)
WBC: 8.2 x10E3/uL (ref 3.4–10.8)

## 2020-03-25 LAB — LIPID PANEL
Chol/HDL Ratio: 3.6 ratio (ref 0.0–5.0)
Cholesterol, Total: 151 mg/dL (ref 100–199)
HDL: 42 mg/dL (ref >39)
LDL Chol Calc (NIH): 84 mg/dL (ref 0–99)
Triglycerides: 143 mg/dL (ref 0–149)
VLDL Cholesterol Cal: 25 mg/dL (ref 5–40)

## 2020-03-27 ENCOUNTER — Telehealth: Payer: Self-pay | Admitting: *Deleted

## 2020-03-27 DIAGNOSIS — E785 Hyperlipidemia, unspecified: Secondary | ICD-10-CM

## 2020-03-27 DIAGNOSIS — I2510 Atherosclerotic heart disease of native coronary artery without angina pectoris: Secondary | ICD-10-CM

## 2020-03-27 DIAGNOSIS — Z79899 Other long term (current) drug therapy: Secondary | ICD-10-CM

## 2020-03-27 DIAGNOSIS — I5022 Chronic systolic (congestive) heart failure: Secondary | ICD-10-CM

## 2020-03-27 NOTE — Telephone Encounter (Signed)
-----   Message from Lyn Records, MD sent at 03/25/2020 12:16 PM EDT ----- Let the patient know the LDL is above target at 84. Worse compared to 2 years ago. Decrease dairy and animal fats in diet.Blood sugar mildly elevated. Hgb A1c suggests prediabetes. What carbohydrates in diet. These labs should be repeated in 1 year. A copy will be sent to Barbette Reichmann, MD

## 2020-04-05 DIAGNOSIS — J019 Acute sinusitis, unspecified: Secondary | ICD-10-CM | POA: Diagnosis not present

## 2020-04-05 DIAGNOSIS — J209 Acute bronchitis, unspecified: Secondary | ICD-10-CM | POA: Diagnosis not present

## 2020-04-05 DIAGNOSIS — R0789 Other chest pain: Secondary | ICD-10-CM | POA: Diagnosis not present

## 2020-04-05 DIAGNOSIS — I10 Essential (primary) hypertension: Secondary | ICD-10-CM | POA: Diagnosis not present

## 2020-04-05 DIAGNOSIS — R0602 Shortness of breath: Secondary | ICD-10-CM | POA: Diagnosis not present

## 2020-04-05 DIAGNOSIS — J90 Pleural effusion, not elsewhere classified: Secondary | ICD-10-CM | POA: Diagnosis not present

## 2020-04-24 ENCOUNTER — Other Ambulatory Visit: Payer: Self-pay | Admitting: Interventional Cardiology

## 2020-05-12 ENCOUNTER — Other Ambulatory Visit: Payer: Self-pay | Admitting: Interventional Cardiology

## 2020-05-12 ENCOUNTER — Other Ambulatory Visit: Payer: Self-pay | Admitting: *Deleted

## 2020-05-12 MED ORDER — ATORVASTATIN CALCIUM 40 MG PO TABS: 40.0000 mg | ORAL_TABLET | Freq: Every day | ORAL | 0 refills | Status: DC

## 2020-05-29 DIAGNOSIS — N401 Enlarged prostate with lower urinary tract symptoms: Secondary | ICD-10-CM | POA: Diagnosis not present

## 2020-05-29 DIAGNOSIS — N5201 Erectile dysfunction due to arterial insufficiency: Secondary | ICD-10-CM | POA: Diagnosis not present

## 2020-08-01 ENCOUNTER — Telehealth: Payer: Self-pay | Admitting: *Deleted

## 2020-08-01 NOTE — Telephone Encounter (Signed)
Patient called assuming there was no refills for ENTRESTO and some one from company told him he didn't have enough refills. [facility] who confirmed that pt medication was not out of refills. Patient was notified to call in for his refills due to not being set up for automatic refills.

## 2020-12-11 ENCOUNTER — Other Ambulatory Visit: Payer: Self-pay | Admitting: *Deleted

## 2020-12-11 DIAGNOSIS — I5022 Chronic systolic (congestive) heart failure: Secondary | ICD-10-CM

## 2020-12-11 DIAGNOSIS — E785 Hyperlipidemia, unspecified: Secondary | ICD-10-CM

## 2020-12-11 DIAGNOSIS — I2510 Atherosclerotic heart disease of native coronary artery without angina pectoris: Secondary | ICD-10-CM

## 2021-02-12 ENCOUNTER — Other Ambulatory Visit: Payer: Self-pay | Admitting: Interventional Cardiology

## 2021-03-27 ENCOUNTER — Telehealth: Payer: Self-pay

## 2021-03-27 NOTE — Telephone Encounter (Signed)
   [facility] Pre-operative Risk Assessment    Patient Name: [name] Walker  DOB: Sep 29, 1955 MRN: [id number]  HEARTCARE STAFF:  - IMPORTANT!!!!!! Under Visit Info/Reason for Call, type in Other and utilize the format Clearance MM/DD/YY or Clearance TBD. Do not use dashes or single digits. - Please review there is not already an duplicate clearance open for this procedure. - If request is for dental extraction, please clarify the # of teeth to be extracted. - If the patient is currently at the dentist's office, call Pre-Op Callback Staff (MA/nurse) to input urgent request.  - If the patient is not currently in the dentist office, please route to the Pre-Op pool.  Request for surgical clearance:  What type of surgery is being performed? 3 FILLING AND 2 CROWNS  When is this surgery scheduled? TBD  What type of clearance is required (medical clearance vs. Pharmacy clearance to hold med vs. Both)? MEDICAL  Are there any medications that need to be held prior to surgery and how long? NONE  Practice name and name of physician performing surgery? [facility], DR THOMAS [place]  What is the office phone number? [phone]   7.   What is the office fax number? [phone]  8.   Anesthesia type (None, local, MAC, general) ? LOCAL   [name] Walker 03/27/2021, 4:57 PM  _________________________________________________________________   (provider comments below)

## 2021-03-28 NOTE — Telephone Encounter (Signed)
   Patient Name: [name] Walker  DOB: Apr 05, 1956 MRN: [id number]  Primary Cardiologist: Lesleigh Noe, MD  Chart reviewed as part of pre-operative protocol coverage.   Dental procedures such as filling and crowns are considered low risk procedures per guidelines and generally do not require any specific cardiac clearance. It is also generally accepted that there is no need to interrupt blood thinner therapy.  SBE prophylaxis is not required for the patient from a cardiac standpoint.  I will route this recommendation to the requesting party via Epic fax function and remove from pre-op pool.  Please call with questions.  Beatriz Stallion, PA-C 03/28/2021, 12:28 PM

## 2021-04-23 ENCOUNTER — Ambulatory Visit (HOSPITAL_COMMUNITY): Payer: BC Managed Care – PPO | Attending: Cardiovascular Disease

## 2021-04-23 ENCOUNTER — Other Ambulatory Visit: Payer: Self-pay

## 2021-04-23 ENCOUNTER — Other Ambulatory Visit: Payer: BC Managed Care – PPO | Admitting: *Deleted

## 2021-04-23 DIAGNOSIS — I2510 Atherosclerotic heart disease of native coronary artery without angina pectoris: Secondary | ICD-10-CM

## 2021-04-23 DIAGNOSIS — I5022 Chronic systolic (congestive) heart failure: Secondary | ICD-10-CM | POA: Diagnosis not present

## 2021-04-23 DIAGNOSIS — E785 Hyperlipidemia, unspecified: Secondary | ICD-10-CM

## 2021-04-23 LAB — ECHOCARDIOGRAM COMPLETE
Area-P 1/2: 3.29 cm2
S' Lateral: 4.7 cm

## 2021-04-23 MED ORDER — PERFLUTREN LIPID MICROSPHERE
1.0000 mL | INTRAVENOUS | Status: AC | PRN
  Administered 2021-04-23: 3 mL via INTRAVENOUS

## 2021-04-24 LAB — HEPATIC FUNCTION PANEL
ALT: 10 IU/L (ref 0–44)
AST: 14 IU/L (ref 0–40)
Albumin: 4.4 g/dL (ref 3.8–4.8)
Alkaline Phosphatase: 65 IU/L (ref 44–121)
Bilirubin Total: 0.7 mg/dL (ref 0.0–1.2)
Bilirubin, Direct: 0.15 mg/dL (ref 0.00–0.40)
Total Protein: 6.4 g/dL (ref 6.0–8.5)

## 2021-04-24 LAB — LIPID PANEL
Chol/HDL Ratio: 3.9 ratio (ref 0.0–5.0)
Cholesterol, Total: 155 mg/dL (ref 100–199)
HDL: 40 mg/dL (ref >39)
LDL Chol Calc (NIH): 89 mg/dL (ref 0–99)
Triglycerides: 147 mg/dL (ref 0–149)
VLDL Cholesterol Cal: 26 mg/dL (ref 5–40)

## 2021-04-24 LAB — BASIC METABOLIC PANEL
BUN/Creatinine Ratio: 22 (ref 10–24)
BUN: 18 mg/dL (ref 8–27)
CO2: 23 mmol/L (ref 20–29)
Calcium: 9.1 mg/dL (ref 8.6–10.2)
Chloride: 103 mmol/L (ref 96–106)
Creatinine, Ser: 0.82 mg/dL (ref 0.76–1.27)
Glucose: 96 mg/dL (ref 70–99)
Potassium: 4.4 mmol/L (ref 3.5–5.2)
Sodium: 140 mmol/L (ref 134–144)
eGFR: 98 mL/min/{1.73_m2} (ref >59)

## 2021-04-24 LAB — CBC
Hematocrit: 42 % (ref 37.5–51.0)
Hemoglobin: 14.5 g/dL (ref 13.0–17.7)
MCH: 33.4 pg — ABNORMAL HIGH (ref 26.6–33.0)
MCHC: 34.5 g/dL (ref 31.5–35.7)
MCV: 97 fL (ref 79–97)
Platelets: 190 10*3/uL (ref 150–450)
RBC: 4.34 x10E6/uL (ref 4.14–5.80)
RDW: 12.3 % (ref 11.6–15.4)
WBC: 7.9 10*3/uL (ref 3.4–10.8)

## 2021-04-24 LAB — HEMOGLOBIN A1C
Est. average glucose Bld gHb Est-mCnc: 120 mg/dL
Hgb A1c MFr Bld: 5.8 % — ABNORMAL HIGH (ref 4.8–5.6)

## 2021-04-25 ENCOUNTER — Telehealth: Payer: Self-pay

## 2021-04-25 NOTE — Telephone Encounter (Signed)
Pt assistance forms printed off with signature and date, by DOD Dr. Graciela Husbands.  Placed completed forms in nurse fax box in medical records, to be faxed to contact information provided on cover sheet.  Will make our Prior Auth Nurse aware of completion.

## 2021-04-25 NOTE — Telephone Encounter (Signed)
**Note De-Identified Bambie Pizzolato Obfuscation** The pts completed [employer] pt asst application for Sherryll Burger was left at the office.  I have completed the providers page with our DODs, Dr Odessa Fleming info in Dr Michaelle Copas absence to prevent delay.  I have emailed all to our TTL nurse with a request to have Dr Graciela Husbands sign, date it, and to fax to [employer] pt asst foundation at the fax number written on the cover letter included or to place in the to be faxed basket in Medical Records to be faxed.

## 2021-04-30 ENCOUNTER — Other Ambulatory Visit: Payer: BC Managed Care – PPO

## 2021-05-01 ENCOUNTER — Telehealth: Payer: Self-pay | Admitting: Interventional Cardiology

## 2021-05-01 NOTE — Telephone Encounter (Signed)
Patient was calling back for results. Please advise °

## 2021-05-01 NOTE — Telephone Encounter (Signed)
Spoke with pt and reviewed labs and echo.  Pt agreeable to trying [place] or [place].  Will wait to start on Monday when he comes in the office because he won't be in [place] to get samples before then.  Pt appreciative for call.

## 2021-05-06 NOTE — Progress Notes (Signed)
Cardiology Office Note:    Date:  05/07/2021   ID:  [name] Walker, DOB May 04, 1956, MRN [id number]  PCP:  [name] Reichmann, MD  Cardiologist:  [name] Noe, MD   Referring MD: [name] Reichmann, MD   Chief Complaint  Patient presents with   Congestive Heart Failure   Coronary Artery Disease     History of Present Illness:    [name] Walker is a 65 y.o. male with a hx of CAD, ischemic CM with EF less than 35%, chronic systolic HF, ongoing tobacco abuse, HTN and OSA. He has a h/o anterior MI in 2013 2/2 occluded proximal LAD that was treated w/ PCI + stent. Last cardiac cath in 2016 showed patent LAD stent but the first diagonal that is partially jailed by the stent contains ostial 50% narrowing.Declined ICD and last EF 40%.   [name] Walker is doing okay.  He plays golf each weekend.  He gets greater than 150 minutes of moderate activity per week.  He denies orthopnea, PND, angina, edema, and syncope.  He has not needed sublingual nitroglycerin.  Having orthostatic dizziness, chronically on the current medical regimen.  Past Medical History:  Diagnosis Date   Bronchitis    CHRONIC   Chronic kidney disease    stone   Coronary artery disease 2013   BMS to LAD   Dysrhythmia 04/01/2017   ekg showed changes from previous ekgs. cleared for surgery by cardiology   Erectile dysfunction    History of kidney stones    HOH (hard of hearing)    Hypercholesterolemia    Hypertension    Ischemic cardiomyopathy    EF 25-30%    MI (myocardial infarction) (HCC) 06/19/2012   being considered for defibilator but patient now refuses this.   NSTEMI (non-ST elevated myocardial infarction) (HCC) 03/2015   Patent BMS - LAD stent.   Sleep apnea    Varicose veins     Past Surgical History:  Procedure Laterality Date   CARDIAC CATHETERIZATION N/A 01/09/2015   Procedure: Left Heart Cath and Coronary Angiography;  Surgeon: [name] Records, MD;  Location: [facility] INVASIVE CV LAB;  Service: Cardiovascular;   Laterality: N/A;   CARDIAC CATHETERIZATION N/A 03/13/2015   Procedure: Left Heart Cath and Coronary Angiography;  Surgeon: [name] Records, MD;  Location: [facility] INVASIVE CV LAB;  Service: Cardiovascular;  Laterality: N/A;   CORONARY ANGIOPLASTY     STENT   CORONARY STENT PLACEMENT     CYSTOSCOPY W/ URETERAL STENT REMOVAL Right 05/13/2017   Procedure: CYSTOSCOPY WITH STENT REMOVAL;  Surgeon: [name] Ape, MD;  Location: [facility];  Service: Urology;  Laterality: Right;   CYSTOSCOPY WITH STENT PLACEMENT Right 03/07/2015   Procedure: CYSTOSCOPY WITH STENT PLACEMENT;  Surgeon: [name] Ape, MD;  Location: [facility];  Service: Urology;  Laterality: Right;   CYSTOSCOPY WITH STENT PLACEMENT Right 03/25/2017   Procedure: CYSTOSCOPY WITH STENT PLACEMENT;  Surgeon: [name] Ape, MD;  Location: [facility];  Service: Urology;  Laterality: Right;   CYSTOSCOPY/RETROGRADE/URETEROSCOPY Right 02/24/2018   Procedure: CYSTOSCOPY/RETROGRADE/URETEROSCOPY;  Surgeon: [name] Ape, MD;  Location: [facility];  Service: Urology;  Laterality: Right;   CYSTOSCOPY/RETROGRADE/URETEROSCOPY/STONE EXTRACTION WITH BASKET Right 03/25/2017   Procedure: CYSTOSCOPY/RETROGRADE/URETEROSCOPY/STONE EXTRACTION WITH BASKET;  Surgeon: [name] Ape, MD;  Location: [facility];  Service: Urology;  Laterality: Right;   EXTRACORPOREAL SHOCK WAVE LITHOTRIPSY Right 03/09/2015   Procedure: EXTRACORPOREAL SHOCK WAVE LITHOTRIPSY (ESWL);  Surgeon: [name] Ape, MD;  Location: [facility];  Service:  Urology;  Laterality: Right;   EXTRACORPOREAL SHOCK WAVE LITHOTRIPSY Right 05/11/2015   Procedure: EXTRACORPOREAL SHOCK WAVE LITHOTRIPSY (ESWL);  Surgeon: [name] Cowper, MD;  Location: [facility];  Service: Urology;  Laterality: Right;   EXTRACORPOREAL SHOCK WAVE LITHOTRIPSY Right 04/10/2017   Procedure: EXTRACORPOREAL SHOCK WAVE LITHOTRIPSY (ESWL);  Surgeon: [name] Cowper, MD;  Location: [facility];  Service: Urology;  Laterality: Right;   LEFT HEART  CATHETERIZATION WITH CORONARY ANGIOGRAM Right 06/19/2012   Procedure: LEFT HEART CATHETERIZATION WITH CORONARY ANGIOGRAM;  Surgeon: [name] Grooms, MD;  Location: [facility] CATH LAB;  Service: Cardiovascular;  Laterality: Right;   PERCUTANEOUS CORONARY STENT INTERVENTION (PCI-S) Right 06/19/2012   Procedure: PERCUTANEOUS CORONARY STENT INTERVENTION (PCI-S);  Surgeon: [name] Grooms, MD;  Location: [facility] CATH LAB;  Service: Cardiovascular;  Laterality: Right;   URETEROSCOPY WITH HOLMIUM LASER LITHOTRIPSY Right 02/24/2018   Procedure: URETEROSCOPY;  Surgeon: [name] Cowper, MD;  Location: [facility];  Service: Urology;  Laterality: Right;    Current Medications: Current Meds  Medication Sig   acetaminophen (TYLENOL) 500 MG tablet Take 1,000 mg by mouth every 6 (six) hours as needed for moderate pain or headache.    atorvastatin (LIPITOR) 40 MG tablet Take 1 tablet (40 mg total) by mouth daily.   dapagliflozin propanediol (FARXIGA) 10 MG TABS tablet Take 1 tablet (10 mg total) by mouth daily before breakfast.   nitroGLYCERIN (NITROSTAT) 0.4 MG SL tablet Place 1 tablet (0.4 mg total) under the tongue every 5 (five) minutes x 3 doses as needed for chest pain.   sacubitril-valsartan (ENTRESTO) 24-26 MG Take 1 tablet by mouth 2 (two) times daily.   tamsulosin (FLOMAX) 0.4 MG CAPS capsule Take 1 capsule (0.4 mg total) by mouth daily.   [DISCONTINUED] carvedilol (COREG) 12.5 MG tablet TAKE ONE TABLET TWICE DAILY     Allergies:   Nucynta [tapentadol] and Codeine   Social History   Socioeconomic History   Marital status: Single    Spouse name: Not on file   Number of children: Not on file   Years of education: Not on file   Highest education level: Not on file  Occupational History   Not on file  Tobacco Use   Smoking status: Every Day    Packs/day: 0.50    Years: 40.00    Pack years: 20.00    Types: Cigarettes   Smokeless tobacco: Never  Vaping Use   Vaping Use: Never used  Substance and  Sexual Activity   Alcohol use: Yes    Alcohol/week: 0.0 standard drinks    Comment: occ   Drug use: No   Sexual activity: Not on file  Other Topics Concern   Not on file  Social History Narrative   Not on file   Social Determinants of Health   Financial Resource Strain: Not on file  Food Insecurity: Not on file  Transportation Needs: Not on file  Physical Activity: Not on file  Stress: Not on file  Social Connections: Not on file     Family History: The patient's family history includes Cancer in his father; Heart attack in his brother; Heart murmur in his mother.  ROS:   Please see the history of present illness.    Easy bruising.  All other systems reviewed and are negative.  EKGs/Labs/Other Studies Reviewed:    The following studies were reviewed today:  ECHOCARDIOGRAM 04/23/2021: IMPRESSIONS     1. Left ventricular ejection fraction, by estimation, is 30 to 35%. The  left ventricle has moderately decreased function. The left ventricle  demonstrates regional wall motion abnormalities (see scoring  diagram/findings for description). The left  ventricular internal cavity size was mildly to moderately dilated. Left  ventricular diastolic parameters are consistent with Grade I diastolic  dysfunction (impaired relaxation).   2. Right ventricular systolic function is normal. The right ventricular  size is mildly enlarged. Tricuspid regurgitation signal is inadequate for  assessing PA pressure.   3. The mitral valve is grossly normal. Mild mitral valve regurgitation.  No evidence of mitral stenosis.   4. The aortic valve is tricuspid. Aortic valve regurgitation is not  visualized. No aortic stenosis is present.   5. The inferior vena cava is normal in size with greater than 50%  respiratory variability, suggesting right atrial pressure of 3 mmHg.   Comparison(s): Prior images unable to be directly viewed, comparison made  by report only. No significant change from  prior study.   Conclusion(s)/Recommendation(s): Findings consistent with ischemic  cardiomyopathy. No left ventricular mural or apical thrombus/thrombi.   EKG:  EKG sinus bradycardia, poor R wave progression with QS pattern V1 through V3.  There is no change compared to prior.  Recent Labs: 04/23/2021: ALT 10; BUN 18; Creatinine, Ser 0.82; Hemoglobin 14.5; Platelets 190; Potassium 4.4; Sodium 140  Recent Lipid Panel    Component Value Date/Time   CHOL 155 04/23/2021 1503   TRIG 147 04/23/2021 1503   HDL 40 04/23/2021 1503   CHOLHDL 3.9 04/23/2021 1503   CHOLHDL 3.5 03/12/2015 0420   VLDL 24 03/12/2015 0420   LDLCALC 89 04/23/2021 1503    Physical Exam:    VS:  BP 102/72   Pulse (!) 57   Ht 5\' 11"  (1.803 m)   Wt 226 lb 6.4 oz (102.7 kg)   SpO2 98%   BMI 31.58 kg/m     Wt Readings from Last 3 Encounters:  05/07/21 226 lb 6.4 oz (102.7 kg)  03/24/20 216 lb 12.8 oz (98.3 kg)  01/20/19 220 lb (99.8 kg)     GEN: Moderate obesity. No acute distress HEENT: Normal NECK: No JVD. LYMPHATICS: No lymphadenopathy CARDIAC: No murmur. RRR no gallop, or edema. VASCULAR:  Normal Pulses. No bruits. RESPIRATORY:  Clear to auscultation without rales, wheezing or rhonchi  ABDOMEN: Soft, non-tender, non-distended, No pulsatile mass, MUSCULOSKELETAL: No deformity  SKIN: Warm and dry NEUROLOGIC:  Alert and oriented x 3 PSYCHIATRIC:  Normal affect   ASSESSMENT:    1. Chronic systolic heart failure ([place])   2. Coronary artery disease involving native coronary artery of native heart without angina pectoris   3. Hyperlipidemia, unspecified hyperlipidemia type   4. Tobacco abuse    PLAN:    In order of problems listed above:  Needs further up titration and heart failure regimen/protection.  We should strive to achieve quadruple therapy with Arni, beta-blocker, MRA, and SGLT2.  Decrease carvedilol to 6.25 mg twice daily.  Start Farxiga 10 mg/day.  Basic metabolic panel 10 to 14 days.   Patient will be seen in return in approximately 6 to 8 weeks.  He should call if orthostatic dizziness or other complaints.  May not be able to use an MRA because of blood pressure. Asymptomatic.  Secondary prevention discussed.   Continue high intensity statin therapy with Lipitor 40 mg/day. Encouraged cessation of smoking.  Guideline directed therapy for left ventricular systolic dysfunction: Angiotensin receptor-neprilysin inhibitor (ARNI)-Entresto; beta-blocker therapy - carvedilol, metoprolol succinate, or bisoprolol; mineralocorticoid receptor antagonist (MRA) therapy -spironolactone or eplerenone.  SGLT-2 agents -  Dapagliflozin [place]) or Empagliflozin ([place]ardiance).These therapies have been shown to improve clinical outcomes including reduction of rehospitalization, survival, and acute heart failure.    Medication Adjustments/Labs and Tests Ordered: Current medicines are reviewed at length with the patient today.  Concerns regarding medicines are outlined above.  Orders Placed This Encounter  Procedures   Basic metabolic panel   EKG [id number]   Meds ordered this encounter  Medications   dapagliflozin propanediol (FARXIGA) 10 MG TABS tablet    Sig: Take 1 tablet (10 mg total) by mouth daily before breakfast.    Dispense:  30 tablet    Refill:  11     There are no Patient Instructions on file for this visit.   Signed, [name] Grooms, MD  05/07/2021 1:33 PM    [facility]

## 2021-05-07 ENCOUNTER — Encounter: Payer: Self-pay | Admitting: Interventional Cardiology

## 2021-05-07 ENCOUNTER — Ambulatory Visit (INDEPENDENT_AMBULATORY_CARE_PROVIDER_SITE_OTHER): Payer: BC Managed Care – PPO | Admitting: Interventional Cardiology

## 2021-05-07 ENCOUNTER — Other Ambulatory Visit: Payer: Self-pay

## 2021-05-07 VITALS — BP 102/72 | HR 57 | Ht 71.0 in | Wt 226.4 lb

## 2021-05-07 DIAGNOSIS — Z720 Tobacco use: Secondary | ICD-10-CM | POA: Diagnosis not present

## 2021-05-07 DIAGNOSIS — I5022 Chronic systolic (congestive) heart failure: Secondary | ICD-10-CM

## 2021-05-07 DIAGNOSIS — E785 Hyperlipidemia, unspecified: Secondary | ICD-10-CM

## 2021-05-07 DIAGNOSIS — I2510 Atherosclerotic heart disease of native coronary artery without angina pectoris: Secondary | ICD-10-CM

## 2021-05-07 MED ORDER — DAPAGLIFLOZIN PROPANEDIOL 10 MG PO TABS: 10.0000 mg | ORAL_TABLET | Freq: Every day | ORAL | 11 refills | Status: DC

## 2021-05-07 MED ORDER — CARVEDILOL 6.25 MG PO TABS: 6.2500 mg | ORAL_TABLET | Freq: Two times a day (BID) | ORAL | 3 refills | Status: DC

## 2021-05-07 NOTE — Patient Instructions (Signed)
Medication Instructions:  1) DECREASE Carvedilol to 6.25mg  twice daily 2) START Farxiga 10mg  once daily  *If you need a refill on your cardiac medications before your next appointment, please call your pharmacy*   Lab Work: BMET in 2 weeks  If you have labs (blood work) drawn today and your tests are completely normal, you will receive your results only by: MyChart Message (if you have MyChart) OR A paper copy in the mail If you have any lab test that is abnormal or we need to change your treatment, we will call you to review the results.   Testing/Procedures: None   Follow-Up: At [facility], you and your health needs are our priority.  As part of our continuing mission to provide you with exceptional heart care, we have created designated Provider Care Teams.  These Care Teams include your primary Cardiologist (physician) and Advanced Practice Providers (APPs -  Physician Assistants and Nurse Practitioners) who all work together to provide you with the care you need, when you need it.  We recommend signing up for the patient portal called "MyChart".  Sign up information is provided on this After Visit Summary.  MyChart is used to connect with patients for Virtual Visits (Telemedicine).  Patients are able to view lab/test results, encounter notes, upcoming appointments, etc.  Non-urgent messages can be sent to your provider as well.   To learn more about what you can do with MyChart, go to [facility].    Your next appointment:   6-8 week(s)  The format for your next appointment:   In Person  Provider:   [email or web address], MD     Other Instructions

## 2021-05-08 ENCOUNTER — Other Ambulatory Visit: Payer: Self-pay

## 2021-05-08 ENCOUNTER — Telehealth: Payer: Self-pay

## 2021-05-08 ENCOUNTER — Other Ambulatory Visit: Payer: Self-pay | Admitting: Interventional Cardiology

## 2021-05-08 MED ORDER — DAPAGLIFLOZIN PROPANEDIOL 10 MG PO TABS: 10.0000 mg | ORAL_TABLET | Freq: Every day | ORAL | 11 refills | Status: DC

## 2021-05-08 NOTE — Telephone Encounter (Signed)
**Note De-identified [name] Walker Obfuscation** I called [facility] andwas given the following information from the pts ins cardneeded to do this Farxiga PA. [employer] ID: [id number] BIN: [id number] No PCN GRP: [id number]  I started a Farxiga PA through covermymeds. [id number]  

## 2021-05-08 NOTE — Telephone Encounter (Signed)
**Note De-Identified Jeydan Barner Obfuscation** Deirdre Pippins Key: [id number] - PA Case ID: [id number] Outcome: Approved today [id number]; Prior Auth;Coverage Start Date:04/08/2021;Coverage End Date:05/08/2022 Drug: Marcelline Deist 10MG  tablets Form: [employer] Electronic PA Form (2017 NCPDP)  I have notified [facility] of this approval.

## 2021-05-08 NOTE — Telephone Encounter (Signed)
**Note De-Identified Shelonda Saxe Obfuscation** Pharmacist with [facility] called me back to let me know that since they are a independent pharmacy the pts [name] Walker will need to be filled [facility].  I called the pt and explained this to him and per his request I have e-[facility] his [name] [place] to [facility].

## 2021-05-09 ENCOUNTER — Other Ambulatory Visit: Payer: Self-pay | Admitting: Interventional Cardiology

## 2021-05-09 MED ORDER — ENTRESTO 24-26 MG PO TABS: 1.0000 | ORAL_TABLET | Freq: Two times a day (BID) | ORAL | 3 refills | Status: DC

## 2021-05-17 ENCOUNTER — Telehealth: Payer: Self-pay | Admitting: Interventional Cardiology

## 2021-05-17 NOTE — Telephone Encounter (Signed)
Pt c/o medication issue:  1. Name of Medication: dapagliflozin propanediol (FARXIGA) 10 MG TABS tablet  2. How are you currently taking this medication (dosage and times per day)? Yes;   3. Are you having a reaction (difficulty breathing--STAT)? No   4. What is your medication issue? Medication make patient lightheadedness ,dizzy and jumpy

## 2021-05-17 NOTE — Telephone Encounter (Signed)
Pt seen on 11/7 and Coreg was decreased to 6.25mg  BID and was started on Farxiga 10mg  QD.  States after a few days on the new regimen he noticed that he was lightheaded, dizzy and jittery all day.  Also would get a HA every morning that would resolve throughout the day. Tried to give it time for his body to adjust but is still having issues.  BP consistently right around 107/70, which is about where it was prior to the medication changes.  Denies SOB, CP or other issues.  Advised I am going to send a message to Dr. for review.  Did advise pt to start taking just half of the Katrinka Blazing until I hear back from Dr. [place].  Pt agreeable to plan.

## 2021-05-17 NOTE — Telephone Encounter (Signed)
Left message to call back  

## 2021-05-17 NOTE — Telephone Encounter (Signed)
If decreasing the dose to 1/2 tablet does not improve his complaints, stop taking Marcelline Deist for one week to see if he feels better. Give [place] follow up and remember we decreased the Carvedilol so if we end up off Farxiga, will need to go back to prior Coreg dose.  ----- Message -----  From: Julio Sicks, RN  Sent: 05/17/2021  10:05 AM EST  To: Lyn Records, MD, Julio Sicks, RN    Left message to call back

## 2021-05-23 MED ORDER — DAPAGLIFLOZIN PROPANEDIOL 5 MG PO TABS: 5.0000 mg | ORAL_TABLET | Freq: Every day | ORAL | 11 refills | Status: DC

## 2021-05-23 NOTE — Telephone Encounter (Signed)
Spoke with pt and he states symptoms have resolved on the Farxiga 5mg  QD.  BP remains the same around 107/70s.  Advised pt I would send to Dr. and let him know and would call back if he had any further recommendations.  Pt appreciative for call.

## 2021-05-23 NOTE — Telephone Encounter (Signed)
Left message to call back  

## 2021-05-23 NOTE — Telephone Encounter (Signed)
Prescription updated to 5mg  QD.  Pt already aware to continue Farxiga 5mg  QD per our last conversation.  Pt scheduled for labs next week.

## 2021-05-28 ENCOUNTER — Other Ambulatory Visit: Payer: Self-pay

## 2021-05-28 ENCOUNTER — Other Ambulatory Visit: Payer: BC Managed Care – PPO | Admitting: *Deleted

## 2021-05-28 DIAGNOSIS — I5022 Chronic systolic (congestive) heart failure: Secondary | ICD-10-CM

## 2021-05-28 LAB — BASIC METABOLIC PANEL
BUN/Creatinine Ratio: 14 (ref 10–24)
BUN: 13 mg/dL (ref 8–27)
CO2: 24 mmol/L (ref 20–29)
Calcium: 9.8 mg/dL (ref 8.6–10.2)
Chloride: 102 mmol/L (ref 96–106)
Creatinine, Ser: 0.91 mg/dL (ref 0.76–1.27)
Glucose: 103 mg/dL — ABNORMAL HIGH (ref 70–99)
Potassium: 4.1 mmol/L (ref 3.5–5.2)
Sodium: 140 mmol/L (ref 134–144)
eGFR: 94 mL/min/{1.73_m2} (ref >59)

## 2021-05-29 ENCOUNTER — Other Ambulatory Visit: Payer: Self-pay | Admitting: Urology

## 2021-05-29 DIAGNOSIS — N200 Calculus of kidney: Secondary | ICD-10-CM

## 2021-05-29 DIAGNOSIS — R319 Hematuria, unspecified: Secondary | ICD-10-CM

## 2021-06-15 ENCOUNTER — Ambulatory Visit: Payer: BC Managed Care – PPO

## 2021-06-18 ENCOUNTER — Ambulatory Visit: Admission: RE | Admit: 2021-06-18 | Payer: BC Managed Care – PPO | Source: Ambulatory Visit

## 2021-06-26 ENCOUNTER — Other Ambulatory Visit: Payer: Self-pay

## 2021-06-26 ENCOUNTER — Ambulatory Visit
Admission: RE | Admit: 2021-06-26 | Discharge: 2021-06-26 | Disposition: A | Discharge status: Home / Self Care | Payer: BC Managed Care – PPO | Source: Ambulatory Visit | Attending: Urology | Admitting: Urology

## 2021-06-26 DIAGNOSIS — N200 Calculus of kidney: Secondary | ICD-10-CM | POA: Diagnosis present

## 2021-06-26 DIAGNOSIS — R319 Hematuria, unspecified: Secondary | ICD-10-CM | POA: Insufficient documentation

## 2021-06-26 MED ORDER — IOHEXOL 350 MG/ML SOLN
100.0000 mL | Freq: Once | INTRAVENOUS | Status: AC | PRN
  Administered 2021-06-26: 16:00:00 100 mL via INTRAVENOUS

## 2021-07-06 ENCOUNTER — Ambulatory Visit: Payer: BC Managed Care – PPO | Admitting: Interventional Cardiology

## 2021-07-09 ENCOUNTER — Encounter
Admission: RE | Admit: 2021-07-09 | Discharge: 2021-07-09 | Disposition: A | Discharge status: Home / Self Care | Payer: BC Managed Care – PPO | Source: Ambulatory Visit | Attending: Urology | Admitting: Urology

## 2021-07-09 ENCOUNTER — Other Ambulatory Visit: Payer: Self-pay

## 2021-07-09 DIAGNOSIS — I10 Essential (primary) hypertension: Secondary | ICD-10-CM | POA: Insufficient documentation

## 2021-07-09 DIAGNOSIS — A419 Sepsis, unspecified organism: Secondary | ICD-10-CM | POA: Diagnosis not present

## 2021-07-09 DIAGNOSIS — Z01810 Encounter for preprocedural cardiovascular examination: Secondary | ICD-10-CM | POA: Insufficient documentation

## 2021-07-09 DIAGNOSIS — A411 Sepsis due to other specified staphylococcus: Secondary | ICD-10-CM | POA: Diagnosis not present

## 2021-07-11 ENCOUNTER — Encounter: Payer: Self-pay | Admitting: Emergency Medicine

## 2021-07-11 ENCOUNTER — Other Ambulatory Visit: Payer: Self-pay

## 2021-07-11 ENCOUNTER — Emergency Department: Payer: BC Managed Care – PPO

## 2021-07-11 ENCOUNTER — Inpatient Hospital Stay
Admission: EM | Admit: 2021-07-11 | Discharge: 2021-07-17 | DRG: 853 | Disposition: A | Discharge status: Home / Self Care | Payer: BC Managed Care – PPO | Attending: Internal Medicine | Admitting: Internal Medicine

## 2021-07-11 DIAGNOSIS — N200 Calculus of kidney: Secondary | ICD-10-CM

## 2021-07-11 DIAGNOSIS — Z7982 Long term (current) use of aspirin: Secondary | ICD-10-CM

## 2021-07-11 DIAGNOSIS — E7800 Pure hypercholesterolemia, unspecified: Secondary | ICD-10-CM | POA: Diagnosis present

## 2021-07-11 DIAGNOSIS — I959 Hypotension, unspecified: Secondary | ICD-10-CM | POA: Diagnosis present

## 2021-07-11 DIAGNOSIS — A411 Sepsis due to other specified staphylococcus: Secondary | ICD-10-CM | POA: Diagnosis present

## 2021-07-11 DIAGNOSIS — G4733 Obstructive sleep apnea (adult) (pediatric): Secondary | ICD-10-CM | POA: Diagnosis present

## 2021-07-11 DIAGNOSIS — N10 Acute tubulo-interstitial nephritis: Secondary | ICD-10-CM | POA: Diagnosis present

## 2021-07-11 DIAGNOSIS — N139 Obstructive and reflux uropathy, unspecified: Secondary | ICD-10-CM | POA: Diagnosis not present

## 2021-07-11 DIAGNOSIS — J9621 Acute and chronic respiratory failure with hypoxia: Secondary | ICD-10-CM | POA: Diagnosis present

## 2021-07-11 DIAGNOSIS — I4892 Unspecified atrial flutter: Secondary | ICD-10-CM | POA: Diagnosis not present

## 2021-07-11 DIAGNOSIS — I110 Hypertensive heart disease with heart failure: Secondary | ICD-10-CM | POA: Diagnosis present

## 2021-07-11 DIAGNOSIS — Z885 Allergy status to narcotic agent status: Secondary | ICD-10-CM

## 2021-07-11 DIAGNOSIS — J440 Chronic obstructive pulmonary disease with acute lower respiratory infection: Secondary | ICD-10-CM | POA: Diagnosis present

## 2021-07-11 DIAGNOSIS — N179 Acute kidney failure, unspecified: Secondary | ICD-10-CM | POA: Diagnosis present

## 2021-07-11 DIAGNOSIS — E872 Acidosis, unspecified: Secondary | ICD-10-CM | POA: Diagnosis present

## 2021-07-11 DIAGNOSIS — I2510 Atherosclerotic heart disease of native coronary artery without angina pectoris: Secondary | ICD-10-CM | POA: Diagnosis not present

## 2021-07-11 DIAGNOSIS — N390 Urinary tract infection, site not specified: Secondary | ICD-10-CM | POA: Diagnosis not present

## 2021-07-11 DIAGNOSIS — J9811 Atelectasis: Secondary | ICD-10-CM | POA: Diagnosis present

## 2021-07-11 DIAGNOSIS — G9341 Metabolic encephalopathy: Secondary | ICD-10-CM | POA: Diagnosis present

## 2021-07-11 DIAGNOSIS — N136 Pyonephrosis: Secondary | ICD-10-CM | POA: Diagnosis present

## 2021-07-11 DIAGNOSIS — I4891 Unspecified atrial fibrillation: Secondary | ICD-10-CM | POA: Diagnosis not present

## 2021-07-11 DIAGNOSIS — N1330 Unspecified hydronephrosis: Secondary | ICD-10-CM

## 2021-07-11 DIAGNOSIS — Z888 Allergy status to other drugs, medicaments and biological substances status: Secondary | ICD-10-CM

## 2021-07-11 DIAGNOSIS — A419 Sepsis, unspecified organism: Secondary | ICD-10-CM | POA: Diagnosis present

## 2021-07-11 DIAGNOSIS — I248 Other forms of acute ischemic heart disease: Secondary | ICD-10-CM | POA: Diagnosis present

## 2021-07-11 DIAGNOSIS — I5020 Unspecified systolic (congestive) heart failure: Secondary | ICD-10-CM

## 2021-07-11 DIAGNOSIS — I252 Old myocardial infarction: Secondary | ICD-10-CM

## 2021-07-11 DIAGNOSIS — Z955 Presence of coronary angioplasty implant and graft: Secondary | ICD-10-CM

## 2021-07-11 DIAGNOSIS — J189 Pneumonia, unspecified organism: Secondary | ICD-10-CM | POA: Diagnosis present

## 2021-07-11 DIAGNOSIS — F17210 Nicotine dependence, cigarettes, uncomplicated: Secondary | ICD-10-CM | POA: Diagnosis present

## 2021-07-11 DIAGNOSIS — I493 Ventricular premature depolarization: Secondary | ICD-10-CM | POA: Diagnosis present

## 2021-07-11 DIAGNOSIS — Z20822 Contact with and (suspected) exposure to covid-19: Secondary | ICD-10-CM | POA: Diagnosis present

## 2021-07-11 DIAGNOSIS — R6521 Severe sepsis with septic shock: Secondary | ICD-10-CM | POA: Diagnosis present

## 2021-07-11 DIAGNOSIS — I5022 Chronic systolic (congestive) heart failure: Secondary | ICD-10-CM | POA: Diagnosis present

## 2021-07-11 DIAGNOSIS — I480 Paroxysmal atrial fibrillation: Secondary | ICD-10-CM | POA: Diagnosis present

## 2021-07-11 DIAGNOSIS — Z720 Tobacco use: Secondary | ICD-10-CM | POA: Diagnosis present

## 2021-07-11 DIAGNOSIS — J441 Chronic obstructive pulmonary disease with (acute) exacerbation: Secondary | ICD-10-CM | POA: Diagnosis present

## 2021-07-11 DIAGNOSIS — Z8249 Family history of ischemic heart disease and other diseases of the circulatory system: Secondary | ICD-10-CM

## 2021-07-11 DIAGNOSIS — I255 Ischemic cardiomyopathy: Secondary | ICD-10-CM | POA: Diagnosis present

## 2021-07-11 DIAGNOSIS — E785 Hyperlipidemia, unspecified: Secondary | ICD-10-CM | POA: Diagnosis present

## 2021-07-11 DIAGNOSIS — R001 Bradycardia, unspecified: Secondary | ICD-10-CM | POA: Diagnosis not present

## 2021-07-11 DIAGNOSIS — Z452 Encounter for adjustment and management of vascular access device: Secondary | ICD-10-CM

## 2021-07-11 DIAGNOSIS — E876 Hypokalemia: Secondary | ICD-10-CM | POA: Diagnosis present

## 2021-07-11 DIAGNOSIS — N529 Male erectile dysfunction, unspecified: Secondary | ICD-10-CM | POA: Diagnosis present

## 2021-07-11 DIAGNOSIS — Z79899 Other long term (current) drug therapy: Secondary | ICD-10-CM

## 2021-07-11 HISTORY — DX: Calculus of kidney: N20.0

## 2021-07-11 HISTORY — DX: Chronic systolic (congestive) heart failure: I50.22

## 2021-07-11 HISTORY — DX: Tobacco use: Z72.0

## 2021-07-11 LAB — CBC WITH DIFFERENTIAL/PLATELET
Abs Immature Granulocytes: 0.04 10*3/uL (ref 0.00–0.07)
Basophils Absolute: 0 10*3/uL (ref 0.0–0.1)
Basophils Relative: 0 %
Eosinophils Absolute: 0 10*3/uL (ref 0.0–0.5)
Eosinophils Relative: 0 %
HCT: 38 % — ABNORMAL LOW (ref 39.0–52.0)
Hemoglobin: 13.5 g/dL (ref 13.0–17.0)
Immature Granulocytes: 1 %
Lymphocytes Relative: 9 %
Lymphs Abs: 0.4 10*3/uL — ABNORMAL LOW (ref 0.7–4.0)
MCH: 34.4 pg — ABNORMAL HIGH (ref 26.0–34.0)
MCHC: 35.5 g/dL (ref 30.0–36.0)
MCV: 96.7 fL (ref 80.0–100.0)
Monocytes Absolute: 0.3 10*3/uL (ref 0.1–1.0)
Monocytes Relative: 9 %
Neutro Abs: 3.1 10*3/uL (ref 1.7–7.7)
Neutrophils Relative %: 81 %
Platelets: 105 10*3/uL — ABNORMAL LOW (ref 150–400)
RBC: 3.93 MIL/uL — ABNORMAL LOW (ref 4.22–5.81)
RDW: 13.1 % (ref 11.5–15.5)
WBC: 3.9 10*3/uL — ABNORMAL LOW (ref 4.0–10.5)
nRBC: 0 % (ref 0.0–0.2)

## 2021-07-11 LAB — URINALYSIS, COMPLETE (UACMP) WITH MICROSCOPIC
Bilirubin Urine: NEGATIVE
Glucose, UA: 150 mg/dL — AB
Ketones, ur: NEGATIVE mg/dL
Leukocytes,Ua: NEGATIVE
Nitrite: NEGATIVE
Protein, ur: 30 mg/dL — AB
Specific Gravity, Urine: 1.012 (ref 1.005–1.030)
pH: 5 (ref 5.0–8.0)

## 2021-07-11 LAB — RESP PANEL BY RT-PCR (FLU A&B, COVID) ARPGX2
Influenza A by PCR: NEGATIVE
Influenza B by PCR: NEGATIVE
SARS Coronavirus 2 by RT PCR: NEGATIVE

## 2021-07-11 LAB — COMPREHENSIVE METABOLIC PANEL
ALT: 15 U/L (ref 0–44)
AST: 19 U/L (ref 15–41)
Albumin: 3.2 g/dL — ABNORMAL LOW (ref 3.5–5.0)
Alkaline Phosphatase: 53 U/L (ref 38–126)
Anion gap: 13 (ref 5–15)
BUN: 54 mg/dL — ABNORMAL HIGH (ref 8–23)
CO2: 19 mmol/L — ABNORMAL LOW (ref 22–32)
Calcium: 8.5 mg/dL — ABNORMAL LOW (ref 8.9–10.3)
Chloride: 101 mmol/L (ref 98–111)
Creatinine, Ser: 3.55 mg/dL — ABNORMAL HIGH (ref 0.61–1.24)
GFR, Estimated: 18 mL/min — ABNORMAL LOW (ref >60)
Glucose, Bld: 118 mg/dL — ABNORMAL HIGH (ref 70–99)
Potassium: 3.2 mmol/L — ABNORMAL LOW (ref 3.5–5.1)
Sodium: 133 mmol/L — ABNORMAL LOW (ref 135–145)
Total Bilirubin: 1.1 mg/dL (ref 0.3–1.2)
Total Protein: 6.4 g/dL — ABNORMAL LOW (ref 6.5–8.1)

## 2021-07-11 LAB — BLOOD GAS, VENOUS
Acid-base deficit: 3.1 mmol/L — ABNORMAL HIGH (ref 0.0–2.0)
Bicarbonate: 20.1 mmol/L (ref 20.0–28.0)
O2 Saturation: 66.9 %
Patient temperature: 37
pCO2, Ven: 31 mmHg — ABNORMAL LOW (ref 44.0–60.0)
pH, Ven: 7.42 (ref 7.250–7.430)
pO2, Ven: 34 mmHg (ref 32.0–45.0)

## 2021-07-11 LAB — LACTIC ACID, PLASMA: Lactic Acid, Venous: 1.6 mmol/L (ref 0.5–1.9)

## 2021-07-11 LAB — TROPONIN I (HIGH SENSITIVITY): Troponin I (High Sensitivity): 57 ng/L — ABNORMAL HIGH (ref <18)

## 2021-07-11 MED ORDER — POLYETHYLENE GLYCOL 3350 17 G PO PACK: 17.0000 g | PACK | Freq: Every day | ORAL | Status: DC | PRN

## 2021-07-11 MED ORDER — DOCUSATE SODIUM 100 MG PO CAPS: 100.0000 mg | ORAL_CAPSULE | Freq: Two times a day (BID) | ORAL | Status: DC | PRN

## 2021-07-11 MED ORDER — SODIUM CHLORIDE 0.9 % IV SOLN
2.0000 g | Freq: Once | INTRAVENOUS | Status: AC
  Administered 2021-07-11: 2 g via INTRAVENOUS
  Filled 2021-07-11: qty 2

## 2021-07-11 MED ORDER — METRONIDAZOLE 500 MG/100ML IV SOLN
500.0000 mg | Freq: Once | INTRAVENOUS | Status: AC
  Administered 2021-07-11: 500 mg via INTRAVENOUS
  Filled 2021-07-11: qty 100

## 2021-07-11 MED ORDER — NOREPINEPHRINE 4 MG/250ML-% IV SOLN
0.0000 ug/min | INTRAVENOUS | Status: DC
  Administered 2021-07-11: 2 ug/min via INTRAVENOUS
  Administered 2021-07-12 (×2): 10 ug/min via INTRAVENOUS
  Filled 2021-07-11 (×3): qty 250

## 2021-07-11 MED ORDER — SODIUM CHLORIDE 0.9 % IV SOLN
2.0000 g | INTRAVENOUS | Status: DC
  Administered 2021-07-12 – 2021-07-17 (×6): 2 g via INTRAVENOUS
  Filled 2021-07-11: qty 2
  Filled 2021-07-11: qty 20
  Filled 2021-07-11 (×2): qty 2
  Filled 2021-07-11 (×3): qty 20

## 2021-07-11 MED ORDER — VANCOMYCIN HCL 2000 MG/400ML IV SOLN
2000.0000 mg | Freq: Once | INTRAVENOUS | Status: AC
  Administered 2021-07-12: 2000 mg via INTRAVENOUS
  Filled 2021-07-11: qty 400

## 2021-07-11 MED ORDER — SODIUM CHLORIDE 0.9 % IV SOLN
500.0000 mg | INTRAVENOUS | Status: DC
  Administered 2021-07-12 – 2021-07-13 (×3): 500 mg via INTRAVENOUS
  Filled 2021-07-11: qty 500
  Filled 2021-07-11: qty 5
  Filled 2021-07-11: qty 500

## 2021-07-11 MED ORDER — LACTATED RINGERS IV BOLUS
2500.0000 mL | Freq: Once | INTRAVENOUS | Status: AC
  Administered 2021-07-11: 2500 mL via INTRAVENOUS

## 2021-07-11 NOTE — ED Provider Notes (Signed)
[facility] Provider Note    Event Date/Time   First MD Initiated Contact with Patient 07/11/21 2139     (approximate)   History   Altered Mental Status   HPI  [name] Walker is a 66 y.o. male   with past medical history of hypertension, hyperlipidemia, known right kidney stone scheduled for lithotripsy tomorrow who presents with fever and altered mental status.  Patient tells me that he is having shortness of breath and felt generally unwell.  He has had some nonproductive cough as well as right flank pain.  Denies nausea vomiting diarrhea.  Denies pain with urination.  He also endorses some dyspnea denies chest pain.  No diarrhea.  No wounds.  History was obtained from his wife who notes that he was more confused tonight and had a fever.  Reviewed patient's most recent CT scan from 06/26/2021 which showed an 11 mm nonobstructing inferior pole stone in the right kidney     Past Medical History:  Diagnosis Date   Bronchitis    CHRONIC   Chronic kidney disease    stone   Coronary artery disease 2013   BMS to LAD   Dysrhythmia 04/01/2017   ekg showed changes from previous ekgs. cleared for surgery by cardiology   Erectile dysfunction    History of kidney stones    HOH (hard of hearing)    Hypercholesterolemia    Hypertension    Ischemic cardiomyopathy    EF 25-30%    MI (myocardial infarction) ([place]) 06/19/2012   being considered for defibilator but patient now refuses this.   NSTEMI (non-ST elevated myocardial infarction) ([place]) 03/2015   Patent BMS - LAD stent.   Sleep apnea    Varicose veins     Patient Active Problem List   Diagnosis Date Noted   Sepsis with hypotension ([place]) 07/11/2021   OSA (obstructive sleep apnea) 04/19/2015   NSTEMI (non-ST elevated myocardial infarction) ([place]) 03/12/2015   Kidney stone on right side 03/02/2015   Claudication ([place]) 01/22/2015   Coronary atherosclerosis of native coronary artery 06/04/2013    Class:  Diagnosis of   Essential hypertension, benign 06/02/2013   Dyslipidemia [id number]   Chronic systolic heart failure ([place]) 06/27/2012    Class: Acute   Old MI (myocardial infarction) 06/19/2012    Class: Acute   Tobacco abuse 06/19/2012     Physical Exam  Triage Vital Signs: ED Triage Vitals  Enc Vitals Group     BP 07/11/21 2150 (!) 67/36     Pulse Rate 07/11/21 2150 100     Resp 07/11/21 2150 18     Temp --      Temp src --      SpO2 07/11/21 2150 92 %     Weight 07/11/21 2152 223 lb (101.2 kg)     Height 07/11/21 2152 5\' 11"  (1.803 m)     Head Circumference --      Peak Flow --      Pain Score 07/11/21 2151 0     Pain Loc --      Pain Edu? --      Excl. in [place]? --     Most recent vital signs: Vitals:   07/12/21 0000 07/12/21 0030  BP: (!) 93/58 102/66  Pulse: 81 81  Resp:  17  Temp:    SpO2: 93% 93%     General: Awake, no distress.  Face is flushed, diaphoretic CV:  Good peripheral perfusion.  Resp:  Normal effort.  Tachypneic, rhonchi throughout Abd:  No distention.  Nontender Neuro:             Awake, Alert, Oriented x 3  Other:     ED Results / Procedures / Treatments  Labs (all labs ordered are listed, but only abnormal results are displayed) Labs Reviewed  CBC WITH DIFFERENTIAL/PLATELET - Abnormal; Notable for the following components:      Result Value   WBC 3.9 (*)    RBC 3.93 (*)    HCT 38.0 (*)    MCH 34.4 (*)    Platelets 105 (*)    Lymphs Abs 0.4 (*)    All other components within normal limits  COMPREHENSIVE METABOLIC PANEL - Abnormal; Notable for the following components:   Sodium 133 (*)    Potassium 3.2 (*)    CO2 19 (*)    Glucose, Bld 118 (*)    BUN 54 (*)    Creatinine, Ser 3.55 (*)    Calcium 8.5 (*)    Total Protein 6.4 (*)    Albumin 3.2 (*)    GFR, Estimated 18 (*)    All other components within normal limits  URINALYSIS, COMPLETE (UACMP) WITH MICROSCOPIC - Abnormal; Notable for the following components:   Color, Urine  YELLOW (*)    APPearance CLOUDY (*)    Glucose, UA 150 (*)    Hgb urine dipstick LARGE (*)    Protein, ur 30 (*)    Bacteria, UA FEW (*)    All other components within normal limits  BLOOD GAS, VENOUS - Abnormal; Notable for the following components:   pCO2, Ven 31 (*)    Acid-base deficit 3.1 (*)    All other components within normal limits  TROPONIN I (HIGH SENSITIVITY) - Abnormal; Notable for the following components:   Troponin I (High Sensitivity) 57 (*)    All other components within normal limits  RESP PANEL BY RT-PCR (FLU A&B, COVID) ARPGX2  CULTURE, BLOOD (ROUTINE X 2)  CULTURE, BLOOD (ROUTINE X 2)  URINE CULTURE  LACTIC ACID, PLASMA  LACTIC ACID, PLASMA  HIV ANTIBODY (ROUTINE TESTING W REFLEX)  PROCALCITONIN  CORTISOL  BRAIN NATRIURETIC PEPTIDE  STREP PNEUMONIAE URINARY ANTIGEN  LEGIONELLA PNEUMOPHILA SEROGP 1 UR AG  CBC  BASIC METABOLIC PANEL  MAGNESIUM  PHOSPHORUS  TROPONIN I (HIGH SENSITIVITY)     EKG  EKG interpreted by myself, right axis deviation, ventricular trigeminy, no acute ischemic changes   RADIOLOGY Reviewed x-ray which does not show any obvious changes to me, radiology does comment there could be an infiltrate at the left base   PROCEDURES:  Critical Care performed: Yes, see critical care procedure note(s)  .Critical Care Performed by: Rada Hay, MD Authorized by: Rada Hay, MD   Critical care provider statement:    Critical care time (minutes):  30   Critical care was time spent personally by me on the following activities:  Development of treatment plan with patient or surrogate, discussions with consultants, evaluation of patient's response to treatment, examination of patient, ordering and review of laboratory studies, ordering and review of radiographic studies, ordering and performing treatments and interventions, pulse oximetry, re-evaluation of patient's condition and review of old charts .1-3 Lead EKG  Interpretation Performed by: Rada Hay, MD Authorized by: Rada Hay, MD     Interpretation: abnormal     ECG rate assessment: tachycardic     Rhythm: sinus tachycardia     Ectopy: trigeminy  Conduction: normal    The patient is on the cardiac monitor to evaluate for evidence of arrhythmia and/or significant heart rate changes.   MEDICATIONS ORDERED IN ED: Medications  norepinephrine (LEVOPHED) 4mg  in 287mL (0.016 mg/mL) premix infusion (11 mcg/min Intravenous Rate/Dose Change 07/12/21 0030)  vancomycin (VANCOREADY) IVPB 2000 mg/400 mL (2,000 mg Intravenous New Bag/Given 07/12/21 0010)  docusate sodium (COLACE) capsule 100 mg (has no administration in time range)  polyethylene glycol (MIRALAX / GLYCOLAX) packet 17 g (has no administration in time range)  cefTRIAXone (ROCEPHIN) 2 g in sodium chloride 0.9 % 100 mL IVPB (has no administration in time range)  azithromycin (ZITHROMAX) 500 mg in sodium chloride 0.9 % 250 mL IVPB (500 mg Intravenous New Bag/Given 07/12/21 0021)  lactated ringers bolus 2,500 mL (0 mLs Intravenous Stopped 07/11/21 2257)  ceFEPIme (MAXIPIME) 2 g in sodium chloride 0.9 % 100 mL IVPB (0 g Intravenous Stopped 07/12/21 0010)  metroNIDAZOLE (FLAGYL) IVPB 500 mg (0 mg Intravenous Stopped 07/12/21 0021)     IMPRESSION / MDM / ASSESSMENT AND PLAN / ED COURSE  I reviewed the triage vital signs and the nursing notes.                              Differential diagnosis includes, but is not limited to, septic shock, pneumonia, infected kidney stone, bacteremia  The patient is a 66 year old male presenting with hypotension fever and altered mental status.  Started with cough and shortness of breath and then patient was more altered tonight.  On arrival he is tachycardic and significantly hypotensive blood pressures in the 0000000 systolic.  Is maintaining his mental status and protecting his airway.  Somewhat tachypneic with rhonchi throughout his lungs abdomen  is benign.  Patient has this known kidney stone on the right which was previously nonobstruction is going to undergo lithotripsy tomorrow planned.  Patient given 30 cc/kg of fluids and started on broad-spectrum antibiotics Vanco cefepime and Flagyl.,  Well as Levophed to maintain a MAP of over 65.  There is chest x-ray does not show an obvious infiltrate to me but radiology does comment there could be a left lower lobe developing infiltrate.  He has no leukocytosis and his lactate is actually normal surprisingly.  He does have acute renal failure with a creatinine of 3.5 from a baseline of just 0.9 about 1 month ago.  I did perform a bedside ultrasound of the kidneys which shows right-sided hydronephrosis normal left side and no distended bladder.  I am concerned in the setting of his acute illness and this right-sided obstruction that he could have an obstructed Pyelo.  Discussed with the ICU NP for admission.  He is still pending his CT chest abdomen pelvis without contrast.  I also discussed the case with Dr. Eliberto Ivory his urologist who agrees with the CT but was not convinced based on the presentation that this was a septic stone.     FINAL CLINICAL IMPRESSION(S) / ED DIAGNOSES   Final diagnoses:  AKI (acute kidney injury) ([place])  Septic shock ([place])     Rx / DC Orders   ED Discharge Orders     None        Note:  This document was prepared using Dragon voice recognition software and may include unintentional dictation errors.   Rada Hay, MD 07/12/21 0040

## 2021-07-11 NOTE — ED Triage Notes (Addendum)
Pt to ED via [facility] from home for AMS. Pt has known kidney stone, saw Urologist this AM. Has Lithotripsy scheduled tomorrow morning. Pt started having new onset of confusion, fever, and dizziness.  Pt denies any burning or discomfort during urination.  Pt is diaphoretic upon assessment  Pt is A&Ox4.

## 2021-07-12 ENCOUNTER — Encounter
Admission: EM | Disposition: A | Discharge status: Home / Self Care | Payer: Self-pay | Source: Home / Self Care | Attending: Internal Medicine

## 2021-07-12 ENCOUNTER — Inpatient Hospital Stay: Payer: BC Managed Care – PPO | Admitting: Anesthesiology

## 2021-07-12 ENCOUNTER — Inpatient Hospital Stay: Payer: BC Managed Care – PPO

## 2021-07-12 ENCOUNTER — Ambulatory Visit: Admission: RE | Admit: 2021-07-12 | Payer: BC Managed Care – PPO | Source: Home / Self Care | Admitting: Urology

## 2021-07-12 ENCOUNTER — Encounter: Payer: Self-pay | Admitting: Urology

## 2021-07-12 DIAGNOSIS — I959 Hypotension, unspecified: Secondary | ICD-10-CM

## 2021-07-12 DIAGNOSIS — A419 Sepsis, unspecified organism: Secondary | ICD-10-CM | POA: Diagnosis not present

## 2021-07-12 HISTORY — PX: CYSTOSCOPY WITH STENT PLACEMENT: SHX5790

## 2021-07-12 LAB — GLUCOSE, CAPILLARY: Glucose-Capillary: 127 mg/dL — ABNORMAL HIGH (ref 70–99)

## 2021-07-12 LAB — PHOSPHORUS: Phosphorus: 3.7 mg/dL (ref 2.5–4.6)

## 2021-07-12 LAB — BASIC METABOLIC PANEL
Anion gap: 12 (ref 5–15)
BUN: 49 mg/dL — ABNORMAL HIGH (ref 8–23)
CO2: 19 mmol/L — ABNORMAL LOW (ref 22–32)
Calcium: 8.2 mg/dL — ABNORMAL LOW (ref 8.9–10.3)
Chloride: 104 mmol/L (ref 98–111)
Creatinine, Ser: 2.76 mg/dL — ABNORMAL HIGH (ref 0.61–1.24)
GFR, Estimated: 25 mL/min — ABNORMAL LOW (ref >60)
Glucose, Bld: 127 mg/dL — ABNORMAL HIGH (ref 70–99)
Potassium: 3.4 mmol/L — ABNORMAL LOW (ref 3.5–5.1)
Sodium: 135 mmol/L (ref 135–145)

## 2021-07-12 LAB — LACTIC ACID, PLASMA: Lactic Acid, Venous: 1.3 mmol/L (ref 0.5–1.9)

## 2021-07-12 LAB — CBC
HCT: 35.4 % — ABNORMAL LOW (ref 39.0–52.0)
Hemoglobin: 12.6 g/dL — ABNORMAL LOW (ref 13.0–17.0)
MCH: 34.1 pg — ABNORMAL HIGH (ref 26.0–34.0)
MCHC: 35.6 g/dL (ref 30.0–36.0)
MCV: 95.9 fL (ref 80.0–100.0)
Platelets: 96 10*3/uL — ABNORMAL LOW (ref 150–400)
RBC: 3.69 MIL/uL — ABNORMAL LOW (ref 4.22–5.81)
RDW: 13.1 % (ref 11.5–15.5)
WBC: 6.7 10*3/uL (ref 4.0–10.5)
nRBC: 0 % (ref 0.0–0.2)

## 2021-07-12 LAB — STREP PNEUMONIAE URINARY ANTIGEN: Strep Pneumo Urinary Antigen: NEGATIVE

## 2021-07-12 LAB — TROPONIN I (HIGH SENSITIVITY): Troponin I (High Sensitivity): 50 ng/L — ABNORMAL HIGH (ref <18)

## 2021-07-12 LAB — PROCALCITONIN: Procalcitonin: 30.36 ng/mL

## 2021-07-12 LAB — MAGNESIUM: Magnesium: 1.5 mg/dL — ABNORMAL LOW (ref 1.7–2.4)

## 2021-07-12 LAB — URINE CULTURE: Culture: NO GROWTH

## 2021-07-12 LAB — BRAIN NATRIURETIC PEPTIDE: B Natriuretic Peptide: 223.1 pg/mL — ABNORMAL HIGH (ref 0.0–100.0)

## 2021-07-12 LAB — MRSA NEXT GEN BY PCR, NASAL: MRSA by PCR Next Gen: NOT DETECTED

## 2021-07-12 LAB — CORTISOL: Cortisol, Plasma: 25.6 ug/dL

## 2021-07-12 LAB — HIV ANTIBODY (ROUTINE TESTING W REFLEX): HIV Screen 4th Generation wRfx: NONREACTIVE

## 2021-07-12 SURGERY — CYSTOSCOPY, WITH STENT INSERTION
Anesthesia: Monitor Anesthesia Care | Site: Ureter | Laterality: Right

## 2021-07-12 SURGERY — LITHOTRIPSY, ESWL
Anesthesia: Choice | Laterality: Right

## 2021-07-12 MED ORDER — METHYLPREDNISOLONE SODIUM SUCC 125 MG IJ SOLR: 125.0000 mg | Freq: Once | INTRAMUSCULAR | Status: DC

## 2021-07-12 MED ORDER — IPRATROPIUM-ALBUTEROL 0.5-2.5 (3) MG/3ML IN SOLN
3.0000 mL | Freq: Four times a day (QID) | RESPIRATORY_TRACT | Status: DC | PRN
  Administered 2021-07-12 (×2): 3 mL via RESPIRATORY_TRACT
  Filled 2021-07-12: qty 3

## 2021-07-12 MED ORDER — IPRATROPIUM-ALBUTEROL 0.5-2.5 (3) MG/3ML IN SOLN: 3.0000 mL | RESPIRATORY_TRACT | Status: DC

## 2021-07-12 MED ORDER — FENTANYL CITRATE (PF) 100 MCG/2ML IJ SOLN
INTRAMUSCULAR | Status: DC | PRN
  Administered 2021-07-12: 50 ug via INTRAVENOUS

## 2021-07-12 MED ORDER — KETAMINE HCL 10 MG/ML IJ SOLN
INTRAMUSCULAR | Status: DC | PRN
  Administered 2021-07-12: 20 mg via INTRAVENOUS
  Administered 2021-07-12 (×2): 10 mg via INTRAVENOUS

## 2021-07-12 MED ORDER — ENOXAPARIN SODIUM 40 MG/0.4ML IJ SOSY
40.0000 mg | PREFILLED_SYRINGE | Freq: Every day | INTRAMUSCULAR | Status: DC
  Administered 2021-07-12: 40 mg via SUBCUTANEOUS
  Filled 2021-07-12: qty 0.4

## 2021-07-12 MED ORDER — ACETAMINOPHEN 10 MG/ML IV SOLN
INTRAVENOUS | Status: AC
  Filled 2021-07-12: qty 100

## 2021-07-12 MED ORDER — KETAMINE HCL 50 MG/5ML IJ SOSY
PREFILLED_SYRINGE | INTRAMUSCULAR | Status: AC
  Filled 2021-07-12: qty 5

## 2021-07-12 MED ORDER — LIDOCAINE HCL URETHRAL/MUCOSAL 2 % EX GEL
CUTANEOUS | Status: DC | PRN
Start: 2021-07-12 — End: 2021-07-12
  Administered 2021-07-12: 1

## 2021-07-12 MED ORDER — FENTANYL CITRATE (PF) 100 MCG/2ML IJ SOLN
INTRAMUSCULAR | Status: AC
  Filled 2021-07-12: qty 2

## 2021-07-12 MED ORDER — MIDAZOLAM HCL 2 MG/2ML IJ SOLN
INTRAMUSCULAR | Status: DC | PRN
  Administered 2021-07-12: 1 mg via INTRAVENOUS

## 2021-07-12 MED ORDER — BUDESONIDE 0.25 MG/2ML IN SUSP
0.2500 mg | Freq: Two times a day (BID) | RESPIRATORY_TRACT | Status: DC
  Administered 2021-07-12 – 2021-07-17 (×11): 0.25 mg via RESPIRATORY_TRACT
  Filled 2021-07-12 (×11): qty 2

## 2021-07-12 MED ORDER — MAGNESIUM SULFATE 2 GM/50ML IV SOLN
2.0000 g | Freq: Once | INTRAVENOUS | Status: AC
  Administered 2021-07-12: 2 g via INTRAVENOUS
  Filled 2021-07-12: qty 50

## 2021-07-12 MED ORDER — IPRATROPIUM-ALBUTEROL 0.5-2.5 (3) MG/3ML IN SOLN
3.0000 mL | Freq: Four times a day (QID) | RESPIRATORY_TRACT | Status: DC
  Administered 2021-07-12 – 2021-07-13 (×3): 3 mL via RESPIRATORY_TRACT
  Filled 2021-07-12 (×4): qty 3

## 2021-07-12 MED ORDER — POTASSIUM CHLORIDE CRYS ER 20 MEQ PO TBCR
20.0000 meq | EXTENDED_RELEASE_TABLET | Freq: Once | ORAL | Status: AC
  Administered 2021-07-12: 20 meq via ORAL
  Filled 2021-07-12: qty 1

## 2021-07-12 MED ORDER — CHLORHEXIDINE GLUCONATE CLOTH 2 % EX PADS
6.0000 | MEDICATED_PAD | Freq: Every day | CUTANEOUS | Status: DC
  Administered 2021-07-12 – 2021-07-16 (×5): 6 via TOPICAL

## 2021-07-12 MED ORDER — IPRATROPIUM-ALBUTEROL 0.5-2.5 (3) MG/3ML IN SOLN
RESPIRATORY_TRACT | Status: AC
  Administered 2021-07-12: 3 mL via RESPIRATORY_TRACT
  Filled 2021-07-12: qty 3

## 2021-07-12 MED ORDER — IOHEXOL 180 MG/ML  SOLN
INTRAMUSCULAR | Status: DC | PRN
  Administered 2021-07-12: 10 mL

## 2021-07-12 MED ORDER — PHENYLEPHRINE HCL (PRESSORS) 10 MG/ML IV SOLN
INTRAVENOUS | Status: DC | PRN
Start: 2021-07-12 — End: 2021-07-12
  Administered 2021-07-12: 160 ug via INTRAVENOUS

## 2021-07-12 MED ORDER — LIDOCAINE HCL (PF) 2 % IJ SOLN
INTRAMUSCULAR | Status: AC
  Filled 2021-07-12: qty 5

## 2021-07-12 MED ORDER — PROPOFOL 10 MG/ML IV BOLUS
INTRAVENOUS | Status: DC | PRN
Start: 2021-07-12 — End: 2021-07-12
  Administered 2021-07-12: 50 mg via INTRAVENOUS

## 2021-07-12 MED ORDER — SODIUM CHLORIDE 0.9 % IR SOLN
Status: DC | PRN
  Administered 2021-07-12: 1000 mL via INTRAVESICAL

## 2021-07-12 MED ORDER — LACTATED RINGERS IV BOLUS
1000.0000 mL | Freq: Once | INTRAVENOUS | Status: AC
  Administered 2021-07-12: 1000 mL via INTRAVENOUS

## 2021-07-12 MED ORDER — ALBUTEROL SULFATE (2.5 MG/3ML) 0.083% IN NEBU: 2.5000 mg | INHALATION_SOLUTION | RESPIRATORY_TRACT | Status: DC | PRN

## 2021-07-12 MED ORDER — MIDAZOLAM HCL 2 MG/2ML IJ SOLN
INTRAMUSCULAR | Status: AC
  Filled 2021-07-12: qty 2

## 2021-07-12 MED ORDER — MIDODRINE HCL 5 MG PO TABS
10.0000 mg | ORAL_TABLET | Freq: Three times a day (TID) | ORAL | Status: DC
  Administered 2021-07-12 – 2021-07-14 (×7): 10 mg via ORAL
  Filled 2021-07-12 (×8): qty 2

## 2021-07-12 MED ORDER — ACETAMINOPHEN 10 MG/ML IV SOLN
INTRAVENOUS | Status: DC | PRN
  Administered 2021-07-12: 1000 mg via INTRAVENOUS

## 2021-07-12 MED ORDER — HYDROCORTISONE SOD SUC (PF) 100 MG IJ SOLR
100.0000 mg | Freq: Three times a day (TID) | INTRAMUSCULAR | Status: DC
  Administered 2021-07-12 – 2021-07-14 (×7): 100 mg via INTRAVENOUS
  Filled 2021-07-12 (×7): qty 2

## 2021-07-12 MED ORDER — PROPOFOL 500 MG/50ML IV EMUL
INTRAVENOUS | Status: DC | PRN
  Administered 2021-07-12: 50 ug/kg/min via INTRAVENOUS

## 2021-07-12 SURGICAL SUPPLY — 22 items
BAG DRAIN CYSTO-URO LG1000N (MISCELLANEOUS) ×2 IMPLANT
BRUSH SCRUB EZ  4% CHG (MISCELLANEOUS) ×1
BRUSH SCRUB EZ 4% CHG (MISCELLANEOUS) ×1 IMPLANT
GAUZE 4X4 16PLY ~~LOC~~+RFID DBL (SPONGE) ×4 IMPLANT
GLOVE SURG ENC MOIS LTX SZ7 (GLOVE) ×4 IMPLANT
GLOVE SURG ENC MOIS LTX SZ7.5 (GLOVE) ×2 IMPLANT
GOWN STRL REUS W/ TWL LRG LVL4 (GOWN DISPOSABLE) ×1 IMPLANT
GOWN STRL REUS W/ TWL XL LVL3 (GOWN DISPOSABLE) ×1 IMPLANT
GOWN STRL REUS W/TWL LRG LVL4 (GOWN DISPOSABLE) ×2
GOWN STRL REUS W/TWL XL LVL3 (GOWN DISPOSABLE) ×2
GUIDEWIRE STR DUAL SENSOR (WIRE) ×2 IMPLANT
GUIDEWIRE STR ZIPWIRE 035X150 (MISCELLANEOUS) ×1 IMPLANT
IV NS IRRIG 3000ML ARTHROMATIC (IV SOLUTION) ×2 IMPLANT
JELLY LUB 2OZ STRL (MISCELLANEOUS) ×1
JELLY LUBE 2OZ STRL (MISCELLANEOUS) ×1 IMPLANT
KIT TURNOVER CYSTO (KITS) ×2 IMPLANT
PACK CYSTO AR (MISCELLANEOUS) ×2 IMPLANT
SET CYSTO W/LG BORE CLAMP LF (SET/KITS/TRAYS/PACK) ×2 IMPLANT
STENT URET 6FRX24 CONTOUR (STENTS) ×1 IMPLANT
STENT URET 6FRX26 CONTOUR (STENTS) ×2 IMPLANT
WATER STERILE IRR 1000ML POUR (IV SOLUTION) ×2 IMPLANT
WATER STERILE IRR 500ML POUR (IV SOLUTION) ×2 IMPLANT

## 2021-07-12 NOTE — Progress Notes (Signed)
eLink Physician-Brief Progress Note Patient Name: [name] Walker DOB: Dec 05, 1955 MRN: [id number]   Date of Service  07/12/2021  HPI/Events of Note  66 yr old male with hx ischemic CM and known kidney stone for which lithotripsy was planned presented with fever and altered mental status.  He is found to have evidence of UTI and acute renal failure.  Empiric abx given along with volume resuscitation.  Presently on norepinephrine drip at 10 mcg/min to maintain MAP.  eICU Interventions  Chart reviewed     Intervention Category Evaluation Type: New Patient Evaluation  Henry [place]el, P 07/12/2021, 4:26 AM

## 2021-07-12 NOTE — Transfer of Care (Signed)
Immediate Anesthesia Transfer of Care Note  Patient: [name] Walker  Procedure(s) Performed: [place]  Patient Location: ICU  Anesthesia Type:MAC  Level of Consciousness: awake, alert  and oriented  Airway & Oxygen Therapy: Patient Spontanous Breathing and Patient connected to face mask oxygen  Post-op Assessment: Report given to RN and Post -op Vital signs reviewed and stable  Post vital signs: Reviewed and stable  Last Vitals:  Vitals Value Taken Time  BP    Temp    Pulse    Resp    SpO2      Last Pain:  Vitals:   07/12/21 0400  TempSrc: Oral  PainSc:          Complications: No notable events documented.

## 2021-07-12 NOTE — Consult Note (Signed)
Urology Consult  Referring physician: Webb Silversmith, DNP, CCRN, FNP-C, AGACNP-BC Acute Care Nurse Practitioner  Reason for referral: Right ureterolithiasis  with urosepsis  Chief Complaint: same  History of Present Illness: 66 y.o. male presented to ER with confusion and fever. CT shows 10 mm obstructing right ureteral stone. Resuscitation with antibiotics and levophed has stabilized him and he has been cleared for stent placement.   Past Medical History:  Diagnosis Date   Bronchitis    CHRONIC   Chronic kidney disease    stone   Coronary artery disease 2013   BMS to LAD   Dysrhythmia 04/01/2017   ekg showed changes from previous ekgs. cleared for surgery by cardiology   Erectile dysfunction    History of kidney stones    HOH (hard of hearing)    Hypercholesterolemia    Hypertension    Ischemic cardiomyopathy    EF 25-30%    MI (myocardial infarction) (HCC) 06/19/2012   being considered for defibilator but patient now refuses this.   NSTEMI (non-ST elevated myocardial infarction) (HCC) 03/2015   Patent BMS - LAD stent.   Sleep apnea    Varicose veins    Past Surgical History:  Procedure Laterality Date   CARDIAC CATHETERIZATION N/A 01/09/2015   Procedure: Left Heart Cath and Coronary Angiography;  Surgeon: Lyn Records, MD;  Location: [facility] INVASIVE CV LAB;  Service: Cardiovascular;  Laterality: N/A;   CARDIAC CATHETERIZATION N/A 03/13/2015   Procedure: Left Heart Cath and Coronary Angiography;  Surgeon: Lyn Records, MD;  Location: [facility] INVASIVE CV LAB;  Service: Cardiovascular;  Laterality: N/A;   CORONARY ANGIOPLASTY     STENT   CORONARY STENT PLACEMENT     CYSTOSCOPY W/ URETERAL STENT REMOVAL Right 05/13/2017   Procedure: CYSTOSCOPY WITH STENT REMOVAL;  Surgeon: Orson Ape, MD;  Location: [facility] ORS;  Service: Urology;  Laterality: Right;   CYSTOSCOPY WITH STENT PLACEMENT Right 03/07/2015   Procedure: CYSTOSCOPY WITH STENT PLACEMENT;  Surgeon: Orson Ape, MD;   Location: [facility] ORS;  Service: Urology;  Laterality: Right;   CYSTOSCOPY WITH STENT PLACEMENT Right 03/25/2017   Procedure: CYSTOSCOPY WITH STENT PLACEMENT;  Surgeon: Orson Ape, MD;  Location: [facility] ORS;  Service: Urology;  Laterality: Right;   CYSTOSCOPY/RETROGRADE/URETEROSCOPY Right 02/24/2018   Procedure: CYSTOSCOPY/RETROGRADE/URETEROSCOPY;  Surgeon: Orson Ape, MD;  Location: [facility] OR[facility];  Service: Urology;  Laterality: Right;   CYSTOSCOPY/RETROGRADE/URETEROSCOPY/STONE EXTRACTION WITH BASKET Right 03/25/2017   Procedure: CYSTOSCOPY/RETROGRADE/URETEROSCOPY/STONE EXTRACTION WITH BASKET;  Surgeon: Orson Ape, MD;  Location: [facility] OR[facility];  Service: Urology;  Laterality: Right;   EXTRACORPOREAL SHOCK WAVE LITHOTRIPSY Right 03/09/2015   Procedure: EXTRACORPOREAL SHOCK WAVE LITHOTRIPSY (ESWL);  Surgeon: Orson Ape, MD;  Location: [facility] OR[facility];  Service: Urology;  Laterality: Right;   EXTRACORPOREAL SHOCK WAVE LITHOTRIPSY Right 05/11/2015   Procedure: EXTRACORPOREAL SHOCK WAVE LITHOTRIPSY (ESWL);  Surgeon: Orson Ape, MD;  Location: [facility];  Service: Urology;  Laterality: Right;   EXTRACORPOREAL SHOCK WAVE LITHOTRIPSY Right 04/10/2017   Procedure: EXTRACORPOREAL SHOCK WAVE LITHOTRIPSY (ESWL);  Surgeon: Orson Ape, MD;  Location: [facility];  Service: Urology;  Laterality: Right;   LEFT HEART CATHETERIZATION WITH CORONARY ANGIOGRAM Right 06/19/2012   Procedure: LEFT HEART CATHETERIZATION WITH CORONARY ANGIOGRAM;  Surgeon: Lesleigh Noe, MD;  Location: [facility] CATH LAB;  Service: Cardiovascular;  Laterality: Right;   PERCUTANEOUS CORONARY STENT INTERVENTION (PCI-S) Right 06/19/2012   Procedure: PERCUTANEOUS CORONARY STENT INTERVENTION (PCI-S);  Surgeon: Lesleigh Noe, MD;  Location: [facility] CATH LAB;  Service: Cardiovascular;  Laterality: Right;   URETEROSCOPY WITH HOLMIUM LASER LITHOTRIPSY Right 02/24/2018   Procedure: URETEROSCOPY;  Surgeon: Orson Ape, MD;  Location: [facility] ORS;   Service: Urology;  Laterality: Right;    Medications: Prior to Admission:  Medications Prior to Admission  Medication Sig Dispense Refill Last Dose   acetaminophen (TYLENOL) 500 MG tablet Take 1,000 mg by mouth every 6 (six) hours as needed for moderate pain or headache.    prn at prn   aspirin EC 81 MG tablet Take 81 mg by mouth daily. Swallow whole.   07/09/2021 at 2000   atorvastatin (LIPITOR) 40 MG tablet TAKE 1 TABLET BY MOUTH DAILY 90 tablet 3 07/11/2021 at 1700   carvedilol (COREG) 6.25 MG tablet Take 1 tablet (6.25 mg total) by mouth 2 (two) times daily. 180 tablet 3 07/11/2021 at 1700   dapagliflozin propanediol (FARXIGA) 5 MG TABS tablet Take 1 tablet (5 mg total) by mouth daily before breakfast. 30 tablet 11 07/11/2021 at 0600   levofloxacin (LEVAQUIN) 500 MG tablet Take 500 mg by mouth daily.   07/11/2021 at 0600   nitroGLYCERIN (NITROSTAT) 0.4 MG SL tablet Place 1 tablet (0.4 mg total) under the tongue every 5 (five) minutes x 3 doses as needed for chest pain. 25 tablet 3 prn at prn   ondansetron (ZOFRAN-ODT) 4 MG disintegrating tablet Take 4 mg by mouth every 8 (eight) hours as needed.   prn at prn   oxyCODONE-acetaminophen (PERCOCET/ROXICET) 5-325 MG tablet Take 1 tablet by mouth every 4 (four) hours as needed.   prn at prn   sacubitril-valsartan (ENTRESTO) 24-26 MG Take 1 tablet by mouth 2 (two) times daily. 180 tablet 3 07/11/2021 at 1700   tamsulosin (FLOMAX) 0.4 MG CAPS capsule Take 1 capsule (0.4 mg total) by mouth daily. 30 capsule 11 07/11/2021 at 0600   Allergies:  Allergies  Allergen Reactions   Nucynta [Tapentadol] Nausea Only and Other (See Comments)    Dizziness, woozy.  Sick to his stomach   Codeine Other (See Comments)    Too drowsy    Family History  Problem Relation Age of Onset   Cancer Father    Heart murmur Mother    Heart attack Brother    Social History:  reports that he has been smoking cigarettes. He has a 20.00 pack-year smoking history. He has never  used smokeless tobacco. He reports current alcohol use. He reports that he does not use drugs.  Review of Systems  Physical Exam:  Vital signs in last 24 hours: Temp:  [98.8 F (37.1 C)-98.9 F (37.2 C)] 98.8 F (37.1 C) (01/12 0301) Pulse Rate:  [71-100] 88 (01/12 0301) Resp:  [15-22] 18 (01/12 0301) BP: (67-102)/(36-66) 96/57 (01/12 0301) SpO2:  [90 %-95 %] 93 % (01/12 0301) Weight:  [100.8 kg-101.2 kg] 100.8 kg (01/12 0500) Physical Exam  Laboratory Data:  Results for orders placed or performed during the hospital encounter of 07/11/21 (from the past 72 hour(s))  Resp Panel by RT-PCR (Flu A&B, Covid) Nasopharyngeal Swab     Status: None   Collection Time: 07/11/21  8:03 PM   Specimen: Nasopharyngeal Swab; Nasopharyngeal(NP) swabs in vial transport medium  Result Value Ref Range   SARS Coronavirus 2 by RT PCR NEGATIVE NEGATIVE    Comment: (NOTE) SARS-CoV-2 target nucleic acids are NOT DETECTED.  The SARS-CoV-2 RNA is generally detectable in upper respiratory specimens during the acute phase of infection. The lowest concentration of SARS-CoV-2  viral copies this assay can detect is 138 copies/mL. A negative result does not preclude SARS-Cov-2 infection and should not be used as the sole basis for treatment or other patient management decisions. A negative result may occur with  improper specimen collection/handling, submission of specimen other than nasopharyngeal swab, presence of viral mutation(s) within the areas targeted by this assay, and inadequate number of viral copies(<138 copies/mL). A negative result must be combined with clinical observations, patient history, and epidemiological information. The expected result is Negative.  Fact Sheet for Patients:  BloggerCourse.com  Fact Sheet for Healthcare Providers:  SeriousBroker.it  This test is no t yet approved or cleared by the [place] FDA and  has been  authorized for detection and/or diagnosis of SARS-CoV-2 by FDA under an Emergency Use Authorization (EUA). This EUA will remain  in effect (meaning this test can be used) for the duration of the COVID-19 declaration under Section 564(b)(1) of the Act, 21 U.S.C.section 360bbb-3(b)(1), unless the authorization is terminated  or revoked sooner.       Influenza A by PCR NEGATIVE NEGATIVE   Influenza B by PCR NEGATIVE NEGATIVE    Comment: (NOTE) The Xpert Xpress SARS-CoV-2/FLU/RSV plus assay is intended as an aid in the diagnosis of influenza from Nasopharyngeal swab specimens and should not be used as a sole basis for treatment. Nasal washings and aspirates are unacceptable for Xpert Xpress SARS-CoV-2/FLU/RSV testing.  Fact Sheet for Patients: BloggerCourse.com  Fact Sheet for Healthcare Providers: SeriousBroker.it  This test is not yet approved or cleared by the [place] FDA and has been authorized for detection and/or diagnosis of SARS-CoV-2 by FDA under an Emergency Use Authorization (EUA). This EUA will remain in effect (meaning this test can be used) for the duration of the COVID-19 declaration under Section 564(b)(1) of the Act, 21 U.S.C. section 360bbb-3(b)(1), unless the authorization is terminated or revoked.  Performed at [facility], [address] [place], [place] [address]   Troponin I (High Sensitivity)     Status: Abnormal   Collection Time: 07/11/21  9:47 PM  Result Value Ref Range   Troponin I (High Sensitivity) 57 (H) <18 ng/L    Comment: (NOTE) Elevated high sensitivity troponin I (hsTnI) values and significant  changes across serial measurements may suggest ACS but many other  chronic and acute conditions are known to elevate hsTnI results.  Refer to the "Links" section for chest pain algorithms and additional  guidance. Performed at [facility], [address]  [place], [place] [address]   CBC with Differential     Status: Abnormal   Collection Time: 07/11/21  9:47 PM  Result Value Ref Range   WBC 3.9 (L) 4.0 - 10.5 K/uL   RBC 3.93 (L) 4.22 - 5.81 MIL/uL   Hemoglobin 13.5 13.0 - 17.0 g/dL   HCT 33.2 (L) 95.1 - 88.4 %   MCV 96.7 80.0 - 100.0 fL   MCH 34.4 (H) 26.0 - 34.0 pg   MCHC 35.5 30.0 - 36.0 g/dL   RDW 16.6 06.3 - 01.6 %   Platelets 105 (L) 150 - 400 K/uL    Comment: Immature Platelet Fraction may be clinically indicated, consider ordering this additional test [id number]    nRBC 0.0 0.0 - 0.2 %   Neutrophils Relative % 81 %   Neutro Abs 3.1 1.7 - 7.7 K/uL   Lymphocytes Relative 9 %   Lymphs Abs 0.4 (L) 0.7 - 4.0 K/uL   Monocytes Relative 9 %  Monocytes Absolute 0.3 0.1 - 1.0 K/uL   Eosinophils Relative 0 %   Eosinophils Absolute 0.0 0.0 - 0.5 K/uL   Basophils Relative 0 %   Basophils Absolute 0.0 0.0 - 0.1 K/uL   Immature Granulocytes 1 %   Abs Immature Granulocytes 0.04 0.00 - 0.07 K/uL    Comment: Performed at [facility], [address] [place], [place] [address]  Comprehensive metabolic panel     Status: Abnormal   Collection Time: 07/11/21  9:47 PM  Result Value Ref Range   Sodium 133 (L) 135 - 145 mmol/L   Potassium 3.2 (L) 3.5 - 5.1 mmol/L   Chloride 101 98 - 111 mmol/L   CO2 19 (L) 22 - 32 mmol/L   Glucose, Bld 118 (H) 70 - 99 mg/dL    Comment: Glucose reference range applies only to samples taken after fasting for at least 8 hours.   BUN 54 (H) 8 - 23 mg/dL   Creatinine, Ser 8.113.55 (H) 0.61 - 1.24 mg/dL   Calcium 8.5 (L) 8.9 - 10.3 mg/dL   Total Protein 6.4 (L) 6.5 - 8.1 g/dL   Albumin 3.2 (L) 3.5 - 5.0 g/dL   AST 19 15 - 41 U/L   ALT 15 0 - 44 U/L   Alkaline Phosphatase 53 38 - 126 U/L   Total Bilirubin 1.1 0.3 - 1.2 mg/dL   GFR, Estimated 18 (L) >60 mL/min    Comment: (NOTE) Calculated using the CKD-EPI Creatinine Equation (2021)    Anion gap 13 5 - 15    Comment: Performed at [facility], [address] [place], [place] [address]  Blood culture (routine x 2)     Status: None (Preliminary result)   Collection Time: 07/11/21  9:47 PM   Specimen: BLOOD  Result Value Ref Range   Specimen Description BLOOD LEFT HAND    Special Requests      BOTTLES DRAWN AEROBIC AND ANAEROBIC Blood Culture adequate volume   Culture      NO GROWTH < 12 HOURS Performed at [facility], [address] [place], [place] [address]    Report Status PENDING   Blood culture (routine x 2)     Status: None (Preliminary result)   Collection Time: 07/11/21  9:47 PM   Specimen: BLOOD  Result Value Ref Range   Specimen Description BLOOD RIGHT ASSIST CONTROL    Special Requests      BOTTLES DRAWN AEROBIC AND ANAEROBIC Blood Culture results may not be optimal due to an excessive volume of blood received in culture bottles   Culture      NO GROWTH < 12 HOURS Performed at [facility], [address] [place], [place] [address]    Report Status PENDING   Lactic acid, plasma     Status: None   Collection Time: 07/11/21  9:47 PM  Result Value Ref Range   Lactic Acid, Venous 1.6 0.5 - 1.9 mmol/L    Comment: Performed at [facility], [address] [place], [place] [address]  Urinalysis, Complete w Microscopic     Status: Abnormal   Collection Time: 07/11/21 11:08 PM  Result Value Ref Range   Color, Urine YELLOW (A) YELLOW   APPearance CLOUDY (A) CLEAR   Specific Gravity, Urine 1.012 1.005 - 1.030   pH 5.0 5.0 - 8.0   Glucose, UA 150 (A) NEGATIVE mg/dL   Hgb urine dipstick LARGE (A) NEGATIVE   Bilirubin Urine NEGATIVE NEGATIVE  Ketones, ur NEGATIVE NEGATIVE mg/dL   Protein, ur 30 (A) NEGATIVE mg/dL   Nitrite NEGATIVE NEGATIVE   Leukocytes,Ua NEGATIVE NEGATIVE   RBC / HPF 11-20 0 - 5 RBC/hpf   WBC, UA 6-10 0 - 5 WBC/hpf   Bacteria, UA FEW (A) NONE SEEN   Squamous Epithelial / LPF 0-5 0 - 5   Mucus PRESENT    Hyaline Casts, UA PRESENT     Comment: Performed  at [facility], [address] [place], [place] [address]  Strep pneumoniae urinary antigen  (not at [facility])     Status: None   Collection Time: 07/11/21 11:08 PM  Result Value Ref Range   Strep Pneumo Urinary Antigen NEGATIVE NEGATIVE    Comment:        Infection due to S. pneumoniae cannot be absolutely ruled out since the antigen present may be below the detection limit of the test. Performed at [facility], [address], [place], [place] [address]   Blood gas, venous     Status: Abnormal   Collection Time: 07/11/21 11:16 PM  Result Value Ref Range   pH, Ven 7.42 7.250 - 7.430   pCO2, Ven 31 (L) 44.0 - 60.0 mmHg   pO2, Ven 34.0 32.0 - 45.0 mmHg   Bicarbonate 20.1 20.0 - 28.0 mmol/L   Acid-base deficit 3.1 (H) 0.0 - 2.0 mmol/L   O2 Saturation 66.9 %   Patient temperature 37.0    Collection site VEIN    Sample type VENOUS     Comment: Performed at [facility], [address] [place], [place] [address]  Troponin I (High Sensitivity)     Status: Abnormal   Collection Time: 07/12/21 12:22 AM  Result Value Ref Range   Troponin I (High Sensitivity) 50 (H) <18 ng/L    Comment: (NOTE) Elevated high sensitivity troponin I (hsTnI) values and significant  changes across serial measurements may suggest ACS but many other  chronic and acute conditions are known to elevate hsTnI results.  Refer to the "Links" section for chest pain algorithms and additional  guidance. Performed at [facility], [address] [place], [place] [address]   Procalcitonin     Status: None   Collection Time: 07/12/21 12:22 AM  Result Value Ref Range   Procalcitonin 30.36 ng/mL    Comment:        Interpretation: PCT >= 10 ng/mL: Important systemic inflammatory response, almost exclusively due to severe bacterial sepsis or septic shock. (NOTE)       Sepsis PCT Algorithm           Lower Respiratory Tract                                      Infection PCT  Algorithm    ----------------------------     ----------------------------         PCT < 0.25 ng/mL                PCT < 0.10 ng/mL          Strongly encourage             Strongly discourage   discontinuation of antibiotics    initiation of antibiotics    ----------------------------     -----------------------------       PCT 0.25 - 0.50 ng/mL  PCT 0.10 - 0.25 ng/mL               OR       >80% decrease in PCT            Discourage initiation of                                            antibiotics      Encourage discontinuation           of antibiotics    ----------------------------     -----------------------------         PCT >= 0.50 ng/mL              PCT 0.26 - 0.50 ng/mL                AND       <80% decrease in PCT             Encourage initiation of                                             antibiotics       Encourage continuation           of antibiotics    ----------------------------     -----------------------------        PCT >= 0.50 ng/mL                  PCT > 0.50 ng/mL               AND         increase in PCT                  Strongly encourage                                      initiation of antibiotics    Strongly encourage escalation           of antibiotics                                     -----------------------------                                           PCT <= 0.25 ng/mL                                                 OR                                        > 80% decrease in PCT  Discontinue / Do not initiate                                             antibiotics  Performed at [facility], [address] [place], [place] [address]   Cortisol     Status: None   Collection Time: 07/12/21 12:22 AM  Result Value Ref Range   Cortisol, Plasma 25.6 ug/dL    Comment: (NOTE) AM    6.7 - 22.6 ug/dL PM   <60.4<10.0       ug/dL Performed at [facility], [address]  [place], [place] [address]   Brain natriuretic peptide     Status: Abnormal   Collection Time: 07/12/21 12:22 AM  Result Value Ref Range   B Natriuretic Peptide 223.1 (H) 0.0 - 100.0 pg/mL    Comment: Performed at [facility], [address] [place], [place] [address]  Lactic acid, plasma     Status: None   Collection Time: 07/12/21 12:28 AM  Result Value Ref Range   Lactic Acid, Venous 1.3 0.5 - 1.9 mmol/L    Comment: Performed at [facility], [address] [place], [place] [address]  Glucose, capillary     Status: Abnormal   Collection Time: 07/12/21  2:54 AM  Result Value Ref Range   Glucose-Capillary 127 (H) 70 - 99 mg/dL    Comment: Glucose reference range applies only to samples taken after fasting for at least 8 hours.   Comment 1 Notify RN    Comment 2 Document in Chart   MRSA Next Gen by PCR, Nasal     Status: None   Collection Time: 07/12/21  2:56 AM   Specimen: Nasal Mucosa; Nasal Swab  Result Value Ref Range   MRSA by PCR Next Gen NOT DETECTED NOT DETECTED    Comment: (NOTE) The GeneXpert MRSA Assay (FDA approved for NASAL specimens only), is one component of a comprehensive MRSA colonization surveillance program. It is not intended to diagnose MRSA infection nor to guide or monitor treatment for MRSA infections. Test performance is not FDA approved in patients less than 65 years old. Performed at [facility], [address] [place], [place] [address]   CBC     Status: Abnormal   Collection Time: 07/12/21  4:22 AM  Result Value Ref Range   WBC 6.7 4.0 - 10.5 K/uL   RBC 3.69 (L) 4.22 - 5.81 MIL/uL   Hemoglobin 12.6 (L) 13.0 - 17.0 g/dL   HCT 86.535.4 (L) 78.439.0 - 69.652.0 %   MCV 95.9 80.0 - 100.0 fL   MCH 34.1 (H) 26.0 - 34.0 pg   MCHC 35.6 30.0 - 36.0 g/dL   RDW 29.513.1 28.411.5 - 13.215.5 %   Platelets 96 (L) 150 - 400 K/uL    Comment: Immature Platelet Fraction may be clinically indicated, consider ordering this additional test [id number]     nRBC 0.0 0.0 - 0.2 %    Comment: Performed at [facility], [address] [place], [place] [address]  Basic metabolic panel     Status: Abnormal   Collection Time: 07/12/21  4:22 AM  Result Value Ref Range   Sodium 135 135 - 145 mmol/L   Potassium 3.4 (L) 3.5 - 5.1 mmol/L   Chloride 104 98 - 111 mmol/L   CO2 19 (L) 22 - 32  mmol/L   Glucose, Bld 127 (H) 70 - 99 mg/dL    Comment: Glucose reference range applies only to samples taken after fasting for at least 8 hours.   BUN 49 (H) 8 - 23 mg/dL   Creatinine, Ser 1.61 (H) 0.61 - 1.24 mg/dL   Calcium 8.2 (L) 8.9 - 10.3 mg/dL   GFR, Estimated 25 (L) >60 mL/min    Comment: (NOTE) Calculated using the CKD-EPI Creatinine Equation (2021)    Anion gap 12 5 - 15    Comment: Performed at [facility], [address], [place], [place] [address]  Magnesium     Status: Abnormal   Collection Time: 07/12/21  4:22 AM  Result Value Ref Range   Magnesium 1.5 (L) 1.7 - 2.4 mg/dL    Comment: Performed at [facility], [address], [place], [place] [address]  Phosphorus     Status: None   Collection Time: 07/12/21  4:22 AM  Result Value Ref Range   Phosphorus 3.7 2.5 - 4.6 mg/dL    Comment: Performed at [facility], [address], [place], [place] [address]   Recent Results (from the past 240 hour(s))  Resp Panel by RT-PCR (Flu A&B, Covid) Nasopharyngeal Swab     Status: None   Collection Time: 07/11/21  8:03 PM   Specimen: Nasopharyngeal Swab; Nasopharyngeal(NP) swabs in vial transport medium  Result Value Ref Range Status   SARS Coronavirus 2 by RT PCR NEGATIVE NEGATIVE Final    Comment: (NOTE) SARS-CoV-2 target nucleic acids are NOT DETECTED.  The SARS-CoV-2 RNA is generally detectable in upper respiratory specimens during the acute phase of infection. The lowest concentration of SARS-CoV-2 viral copies this assay can detect is 138 copies/mL. A negative result does not preclude  SARS-Cov-2 infection and should not be used as the sole basis for treatment or other patient management decisions. A negative result may occur with  improper specimen collection/handling, submission of specimen other than nasopharyngeal swab, presence of viral mutation(s) within the areas targeted by this assay, and inadequate number of viral copies(<138 copies/mL). A negative result must be combined with clinical observations, patient history, and epidemiological information. The expected result is Negative.  Fact Sheet for Patients:  BloggerCourse.com  Fact Sheet for Healthcare Providers:  SeriousBroker.it  This test is no t yet approved or cleared by the [place] FDA and  has been authorized for detection and/or diagnosis of SARS-CoV-2 by FDA under an Emergency Use Authorization (EUA). This EUA will remain  in effect (meaning this test can be used) for the duration of the COVID-19 declaration under Section 564(b)(1) of the Act, 21 U.S.C.section 360bbb-3(b)(1), unless the authorization is terminated  or revoked sooner.       Influenza A by PCR NEGATIVE NEGATIVE Final   Influenza B by PCR NEGATIVE NEGATIVE Final    Comment: (NOTE) The Xpert Xpress SARS-CoV-2/FLU/RSV plus assay is intended as an aid in the diagnosis of influenza from Nasopharyngeal swab specimens and should not be used as a sole basis for treatment. Nasal washings and aspirates are unacceptable for Xpert Xpress SARS-CoV-2/FLU/RSV testing.  Fact Sheet for Patients: BloggerCourse.com  Fact Sheet for Healthcare Providers: SeriousBroker.it  This test is not yet approved or cleared by the [place] FDA and has been authorized for detection and/or diagnosis of SARS-CoV-2 by FDA under an Emergency Use Authorization (EUA). This EUA will remain in effect (meaning this test can be used) for the duration of  the COVID-19 declaration under Section  564(b)(1) of the Act, 21 U.S.C. section 360bbb-3(b)(1), unless the authorization is terminated or revoked.  Performed at [facility], [address] [place], [place] [address]   Blood culture (routine x 2)     Status: None (Preliminary result)   Collection Time: 07/11/21  9:47 PM   Specimen: BLOOD  Result Value Ref Range Status   Specimen Description BLOOD LEFT HAND  Final   Special Requests   Final    BOTTLES DRAWN AEROBIC AND ANAEROBIC Blood Culture adequate volume   Culture   Final    NO GROWTH < 12 HOURS Performed at [facility], [address] [place], [place] [address]    Report Status PENDING  Incomplete  Blood culture (routine x 2)     Status: None (Preliminary result)   Collection Time: 07/11/21  9:47 PM   Specimen: BLOOD  Result Value Ref Range Status   Specimen Description BLOOD RIGHT ASSIST CONTROL  Final   Special Requests   Final    BOTTLES DRAWN AEROBIC AND ANAEROBIC Blood Culture results may not be optimal due to an excessive volume of blood received in culture bottles   Culture   Final    NO GROWTH < 12 HOURS Performed at [facility], [address] [place], [place] [address]    Report Status PENDING  Incomplete  MRSA Next Gen by PCR, Nasal     Status: None   Collection Time: 07/12/21  2:56 AM   Specimen: Nasal Mucosa; Nasal Swab  Result Value Ref Range Status   MRSA by PCR Next Gen NOT DETECTED NOT DETECTED Final    Comment: (NOTE) The GeneXpert MRSA Assay (FDA approved for NASAL specimens only), is one component of a comprehensive MRSA colonization surveillance program. It is not intended to diagnose MRSA infection nor to guide or monitor treatment for MRSA infections. Test performance is not FDA approved in patients less than 75 years old. Performed at [facility], [address] [place], [place] [address]    Creatinine: Recent Labs    07/11/21 2147  07/12/21 0422  CREATININE 3.55* 2.76*   Baseline Creatinine: unknown  Impression/Assessment:  Obstructing right ureterolithiasis with associated urosepsis  Plan:  Cystoscopy with right stent placement  Orson Ape 07/12/2021, 5:53 AM

## 2021-07-12 NOTE — H&P (Addendum)
NAME:  [name] Walker, MRN:  [name], DOB:  05-17-1956, LOS: 1 ADMISSION DATE:  07/11/2021, CONSULTATION DATE:  07/12/21 REFERRING MD:  Acquanetta Belling MD CHIEF COMPLAINT:  Altered Mental status   HPI  66 y.o with significant PMH of CAD, h/o anterior MI in 2013 2/2 occluded proximal LAD that was treated w/ PCI + stent,  ischemic cardiomyopathy with a EF [id number], chronic systolic heart failure, ongoing tobacco abuse, hypertension, OSA, bronchitis and chronic kidney stones presenting to the ED with chief complaints of altered mental status.  Patient unable to provide history, history obtained from patient's wife who is currently at the bedside.  Per patient's wife, patient developed fevers and chills yesterday associated with right flank pain.  Symptoms started 2 days ago with nausea vomiting and what appears to be hematuria per patient's wife.  He was noted to be altered by wife who called EMS.  ED Course:  In the emergency department, the temperature was 37.2C, the heart rate 87 beats/minute, the blood pressure 67/36 mm Hg, the respiratory rate 18 breaths/minute, and the oxygen saturation 92 % on RA. Pertinent Labs in Red Findings: Na+/ K+: 133/3.2, Glucose: 118, BUN/Cr.: 54/3.55, Calcium: 8.5, WBC: 3.9, Plts: 105, PCT: 30.36, Lactic acid: 2.1, COVID, PCR: Negative, Troponin: 57, BNP: 223.  CT chest abdomen pelvis showed 10 mm proximal right ureteral stone with severe right hydronephrosis and perinephric stranding. Patient given 30 cc/kg of fluids and started on broad-spectrum antibiotics Vanco cefepime and Flagyl for sepsis with septic shock. Patient remained hypotensive despite IVF boluses therefore was started on Levophed. PCCM consulted.   Significant Hospital Events   1/12: Admitted to ICU with sepsis secondary to acute pyelonephritis  Consults:  Urology  Procedures:  None  Significant Diagnostic Tests:  1/12/: CTA chest, abdomen and pelvis>The 10 mm proximal right ureteral stone described  above is obstructing, causing severe right hydronephrosis with perinephric stranding.  Micro Data:  1/11: SARS-CoV-2 PCR> negative 1/11: Influenza PCR> negative 1/12: Blood culture x2> 1/12: Urine Culture> 1/12: MRSA PCR>>  1/12: Strep pneumo urinary antigen> 1/12: Legionella urinary antigen>  Antimicrobials:  Vancomycin 1/11 x 1 Cefepime 1/11 x1 Metronidazole 1/11x1 Azithromycin 1/12> Ceftriaxone 1/12>  OBJECTIVE  Blood pressure (!) 96/57, pulse 88, temperature 98.8 F (37.1 C), temperature source Oral, resp. rate 18, height 5\' 11"  (1.803 m), weight 101.2 kg, SpO2 93 %.        Intake/Output Summary (Last 24 hours) at 07/12/2021 0346 Last data filed at 07/12/2021 0328 Gross per 24 hour  Intake 142.59 ml  Output 220 ml  Net -77.41 ml   Filed Weights   07/11/21 2152  Weight: 101.2 kg   Physical Examination  GENERAL: 66 year-old critically ill patient lying in the bed with no acute  EYES: Pupils equal, round, reactive to light and accommodation. No scleral icterus. Extraocular muscles intact.  HEENT: Head atraumatic, normocephalic. Oropharynx and nasopharynx clear. Hard of hearing NECK:  Supple, no jugular venous distention. No thyroid enlargement, no tenderness.  LUNGS: Normal breath sounds bilaterally, no wheezing, rales,rhonchi or crepitation. No use of accessory muscles of respiration.  CARDIOVASCULAR: S1, S2 normal. No murmurs, rubs, or gallops.  ABDOMEN: Soft, nontender, nondistended. Bowel sounds present. No organomegaly or mass. Right flank tenderness. EXTREMITIES: No pedal edema, cyanosis, or clubbing.  NEUROLOGIC: Cranial nerves II through XII are intact.  Muscle strength 5/5 in all extremities. Sensation intact. Gait not checked.  PSYCHIATRIC: The patient is alert and oriented x 3.  SKIN: No obvious rash, lesion,  or ulcer.   Labs/imaging that I havepersonally reviewed  (right click and "Reselect all SmartList Selections" daily)     Labs   CBC: Recent Labs   Lab 07/11/21 2147  WBC 3.9*  NEUTROABS 3.1  HGB 13.5  HCT 38.0*  MCV 96.7  PLT 105*    Basic Metabolic Panel: Recent Labs  Lab 07/11/21 2147  NA 133*  K 3.2*  CL 101  CO2 19*  GLUCOSE 118*  BUN 54*  CREATININE 3.55*  CALCIUM 8.5*   GFR: Estimated Creatinine Clearance: 25.5 mL/min (A) (by C-G formula based on SCr of 3.55 mg/dL (H)). Recent Labs  Lab 07/11/21 2147 07/12/21 0022 07/12/21 0028  PROCALCITON  --  30.36  --   WBC 3.9*  --   --   LATICACIDVEN 1.6  --  1.3    Liver Function Tests: Recent Labs  Lab 07/11/21 2147  AST 19  ALT 15  ALKPHOS 53  BILITOT 1.1  PROT 6.4*  ALBUMIN 3.2*   No results for input(s): LIPASE, AMYLASE in the last 168 hours. No results for input(s): AMMONIA in the last 168 hours.  ABG    Component Value Date/Time   PHART 7.473 (H) 06/23/2012 0445   PCO2ART 34.3 (L) 06/23/2012 0445   PO2ART 67.7 (L) 06/23/2012 0445   HCO3 20.1 07/11/2021 2316   TCO2 25.9 06/23/2012 0445   ACIDBASEDEF 3.1 (H) 07/11/2021 2316   O2SAT 66.9 07/11/2021 2316     Coagulation Profile: No results for input(s): INR, PROTIME in the last 168 hours.  Cardiac Enzymes: No results for input(s): CKTOTAL, CKMB, CKMBINDEX, TROPONINI in the last 168 hours.  HbA1C: Hgb A1c MFr Bld  Date/Time Value Ref Range Status  04/23/2021 03:03 PM 5.8 (H) 4.8 - 5.6 % Final    Comment:             Prediabetes: 5.7 - 6.4          Diabetes: >6.4          Glycemic control for adults with diabetes: <7.0   03/24/2020 03:47 PM 5.9 (H) 4.8 - 5.6 % Final    Comment:             Prediabetes: 5.7 - 6.4          Diabetes: >6.4          Glycemic control for adults with diabetes: <7.0     CBG: Recent Labs  Lab 07/12/21 0254  GLUCAP 127*    Review of Systems:   Review of Systems  Constitutional:  Positive for chills and fever.  Respiratory:  Positive for sputum production, shortness of breath and wheezing.   Gastrointestinal:  Positive for nausea and vomiting.   Genitourinary:  Positive for flank pain and frequency.  Musculoskeletal:  Positive for back pain.  Neurological: Negative.   Endo/Heme/Allergies: Negative.   Psychiatric/Behavioral: Negative.     Past Medical History  He,  has a past medical history of Bronchitis, Chronic kidney disease, Coronary artery disease (2013), Dysrhythmia (04/01/2017), Erectile dysfunction, History of kidney stones, HOH (hard of hearing), Hypercholesterolemia, Hypertension, Ischemic cardiomyopathy, MI (myocardial infarction) ([place]) (06/19/2012), NSTEMI (non-ST elevated myocardial infarction) ([place]) (03/2015), Sleep apnea, and Varicose veins.   Surgical History    Past Surgical History:  Procedure Laterality Date   CARDIAC CATHETERIZATION N/A 01/09/2015   Procedure: Left Heart Cath and Coronary Angiography;  Surgeon: Belva Crome, MD;  Location: [place] [facility];  Service: Cardiovascular;  Laterality: N/A;   CARDIAC CATHETERIZATION  N/A 03/13/2015   Procedure: Left Heart Cath and Coronary Angiography;  Surgeon: Belva Crome, MD;  Location: [place] [facility];  Service: Cardiovascular;  Laterality: N/A;   CORONARY ANGIOPLASTY     STENT   CORONARY STENT PLACEMENT     CYSTOSCOPY W/ URETERAL STENT REMOVAL Right 05/13/2017   Procedure: CYSTOSCOPY WITH STENT REMOVAL;  Surgeon: Royston Cowper, MD;  Location: [facility];  Service: Urology;  Laterality: Right;   CYSTOSCOPY WITH STENT PLACEMENT Right 03/07/2015   Procedure: CYSTOSCOPY WITH STENT PLACEMENT;  Surgeon: Royston Cowper, MD;  Location: [facility];  Service: Urology;  Laterality: Right;   CYSTOSCOPY WITH STENT PLACEMENT Right 03/25/2017   Procedure: CYSTOSCOPY WITH STENT PLACEMENT;  Surgeon: Royston Cowper, MD;  Location: [facility];  Service: Urology;  Laterality: Right;   CYSTOSCOPY/RETROGRADE/URETEROSCOPY Right 02/24/2018   Procedure: CYSTOSCOPY/RETROGRADE/URETEROSCOPY;  Surgeon: Royston Cowper, MD;  Location: [facility];  Service: Urology;  Laterality: Right;    CYSTOSCOPY/RETROGRADE/URETEROSCOPY/STONE EXTRACTION WITH BASKET Right 03/25/2017   Procedure: CYSTOSCOPY/RETROGRADE/URETEROSCOPY/STONE EXTRACTION WITH BASKET;  Surgeon: Royston Cowper, MD;  Location: [facility];  Service: Urology;  Laterality: Right;   EXTRACORPOREAL SHOCK WAVE LITHOTRIPSY Right 03/09/2015   Procedure: EXTRACORPOREAL SHOCK WAVE LITHOTRIPSY (ESWL);  Surgeon: Royston Cowper, MD;  Location: [facility];  Service: Urology;  Laterality: Right;   EXTRACORPOREAL SHOCK WAVE LITHOTRIPSY Right 05/11/2015   Procedure: EXTRACORPOREAL SHOCK WAVE LITHOTRIPSY (ESWL);  Surgeon: Royston Cowper, MD;  Location: [facility];  Service: Urology;  Laterality: Right;   EXTRACORPOREAL SHOCK WAVE LITHOTRIPSY Right 04/10/2017   Procedure: EXTRACORPOREAL SHOCK WAVE LITHOTRIPSY (ESWL);  Surgeon: Royston Cowper, MD;  Location: [facility];  Service: Urology;  Laterality: Right;   LEFT HEART CATHETERIZATION WITH CORONARY ANGIOGRAM Right 06/19/2012   Procedure: LEFT HEART CATHETERIZATION WITH CORONARY ANGIOGRAM;  Surgeon: Sinclair Grooms, MD;  Location: [facility];  Service: Cardiovascular;  Laterality: Right;   PERCUTANEOUS CORONARY STENT INTERVENTION (PCI-S) Right 06/19/2012   Procedure: PERCUTANEOUS CORONARY STENT INTERVENTION (PCI-S);  Surgeon: Sinclair Grooms, MD;  Location: [facility] CATH LAB;  Service: Cardiovascular;  Laterality: Right;   URETEROSCOPY WITH HOLMIUM LASER LITHOTRIPSY Right 02/24/2018   Procedure: URETEROSCOPY;  Surgeon: Royston Cowper, MD;  Location: [facility];  Service: Urology;  Laterality: Right;     Social History   reports that he has been smoking cigarettes. He has a 20.00 pack-year smoking history. He has never used smokeless tobacco. He reports current alcohol use. He reports that he does not use drugs.   Family History   His family history includes Cancer in his father; Heart attack in his brother; Heart murmur in his mother.   Allergies Allergies  Allergen Reactions   Nucynta  [Tapentadol] Nausea Only and Other (See Comments)    Dizziness, woozy.  Sick to his stomach   Codeine Other (See Comments)    Too drowsy     Home Medications  Prior to Admission medications   Medication Sig Start Date End Date Taking? Authorizing Provider  acetaminophen (TYLENOL) 500 MG tablet Take 1,000 mg by mouth every 6 (six) hours as needed for moderate pain or headache.    Yes [provider]  aspirin EC 81 MG tablet Take 81 mg by mouth daily. Swallow whole.   Yes [provider]  atorvastatin (LIPITOR) 40 MG tablet TAKE 1 TABLET BY MOUTH DAILY 05/10/21  Yes Belva Crome, MD  carvedilol (COREG) 6.25 MG tablet Take 1 tablet (6.25 mg total) by  mouth 2 (two) times daily. 05/07/21  Yes Belva Crome, MD  dapagliflozin propanediol (FARXIGA) 5 MG TABS tablet Take 1 tablet (5 mg total) by mouth daily before breakfast. 05/23/21  Yes Belva Crome, MD  levofloxacin (LEVAQUIN) 500 MG tablet Take 500 mg by mouth daily. 07/08/21 07/18/21 Yes [provider]  nitroGLYCERIN (NITROSTAT) 0.4 MG SL tablet Place 1 tablet (0.4 mg total) under the tongue every 5 (five) minutes x 3 doses as needed for chest pain. 01/28/18  Yes Burtis Junes, NP  ondansetron (ZOFRAN-ODT) 4 MG disintegrating tablet Take 4 mg by mouth every 8 (eight) hours as needed. 07/08/21  Yes [provider]  oxyCODONE-acetaminophen (PERCOCET/ROXICET) 5-325 MG tablet Take 1 tablet by mouth every 4 (four) hours as needed. 07/08/21  Yes [provider]  sacubitril-valsartan (ENTRESTO) 24-26 MG Take 1 tablet by mouth 2 (two) times daily. 05/09/21  Yes Belva Crome, MD  tamsulosin (FLOMAX) 0.4 MG CAPS capsule Take 1 capsule (0.4 mg total) by mouth daily. 03/25/17  Yes Royston Cowper, MD  Scheduled Meds:  Chlorhexidine Gluconate Cloth  6 each Topical Q0600   Continuous Infusions:  azithromycin Stopped (07/12/21 0205)   cefTRIAXone (ROCEPHIN)  IV     norepinephrine (LEVOPHED) Adult infusion 10  mcg/min (07/12/21 0328)   PRN Meds:.docusate sodium, polyethylene glycol  Assessment & Plan:  Sepsis with Septic Shock secondary to acute obstructive pyelonephritis and Possible underlying Pneumonia Lactic: 2.1, Baseline PCT: 30.36, UA: +, CXR: developing infiltrates at left base, CT: acute pyelonephritis Initial interventions/workup included: 2 L of NS/LR & Cefepime/ Vancomycin/ Metronidazole -Supplemental oxygen as needed, to maintain SpO2 > 90% -F/u cultures, trend lactic/ PCT -Monitor WBC/ fever curve -Continue IV antibiotics with ceftriaxone and azithromycin -IVF hydration as needed -Pressors for MAP goal >65 -Strict I/O's  Acute right-sided Obstructing right ureterolithiasis with associated urosepsis PMHx: Chronic Kidney stones CT shows 10 mm right ureteral obstructing stone causing severe right hydronephrosis with perinephric stranding -Urology consult.  Discussed with on-call urology Dr.Wolf pending possible Percutaneous nephrostomy (PNS) or ureteral stenting ([place])  Acute on Chronic Hypoxic Respiratory Failure secondary to possible Pneumonia and AECOPD PMHx: COPD, Bronchitis, tobacco abuse, OSA -Supplemental O2 to maintain SpO2 >88% -Intermittent chest x-ray & ABG PRN -Daily WUA with SBT as tolerated  -Ensure adequate pulmonary hygiene  -F/u cultures, trend PCT -Continue CAP coverage as above -Budesonide inhaler/nebs BID, bronchodilators PRN -Smoking cessation education once stabilized   Chronic systolic heart failure last known EF <35% -Hypertension Hx: CAD anterior MI in 2013 2/2 occluded proximal LAD s/p PCI + stent,  ischemic cardiomyopathy, HLD  -Does not appear to be volume overloaded -Continuous cardiac monitoring -Maintain MAP greater than 65 -Hold carvedilol and Entresto in the setting of hypotension -Continue atorvastatin once able to take p.o.   Acute Metabolic Encephalopathy due to sepsis -Provide supportive care   Acute Kidney Injury in the setting of  severe obstructive uropathy -Monitor I&O's / urinary output -Follow BMP -Ensure adequate renal perfusion -Avoid nephrotoxic agents as able -Replace electrolytes as indicated    Best practice:  Diet:  NPO Pain/Anxiety/Delirium protocol (if indicated): No VAP protocol (if indicated): Not indicated DVT prophylaxis: Contraindicated GI prophylaxis: PPI Glucose control:  SSI No Central venous access:  Yes, and it is still needed Arterial line:  N/A Foley:  Yes, and it is still needed Mobility:  bed rest  PT consulted: N/A Last date of multidisciplinary goals of care discussion [1/12] Code Status:  full code Disposition: ICU   =  Goals of Care = Code Status Order: FULL  Primary Emergency Contact: Calion Wishes to pursue full aggressive treatment and intervention options, including CPR and intubation, but goals of care will be addressed on going with family if that should become necessary.  Critical care time: 45 minutes     Rufina Falco, DNP, CCRN, FNP-C, AGACNP-BC Acute Care Nurse Practitioner  [facility] Pager: [phone] [place] [facility]  .

## 2021-07-12 NOTE — Op Note (Signed)
Preoperative diagnosis: 1.  Right ureterolithiasis with hydronephrosis                                            2.  Urinary sepsis  Postoperative diagnosis: Same  Procedure: 1.  Cystoscopy with right stent placement                     2.  Fluoroscopy  Surgeon: Suszanne Conners. Evelene Croon MD  Anesthesia: Sedation  Indications: See the history and physical.   66 y.o. male presented to ER with confusion and fever. CT shows 10 mm obstructing right ureteral stone. Resuscitation with antibiotics and levophed has stabilized him and he has been cleared for stent placement. After informed consent the above procedure(s) were requested.     Technique and findings: After adequate sedation been obtained patient was placed into dorsal lithotomy position and the perineum was prepped and draped in the usual fashion.  Fluoroscopy revealed a proximal 10 mm right ureteral stone.  The 73 French the scope was coupled the camera and visually advanced into the bladder.  No bladder tumors were identified.  Both ureteral orifices were identified and had clear efflux.  With fluoroscopic guidance a 0.035 Glidewire was advanced up the right urete beyond the ureteral calculus and curled into the right renal pelvis.  A 6 x 26 cm double-pigtail ureteral stent was then advanced over the guidewire and positioned in the ureter.  The guidewire was then removed, taking care to leave the stent in position.  The bladder was drained and cystoscope was removed.  10 cc of viscous Xylocaine was instilled within the urethra and the bladder.  The procedure was then terminated and patient transferred back to ICU in stable condition.

## 2021-07-12 NOTE — Progress Notes (Signed)
PHARMACY CONSULT NOTE - FOLLOW UP  Pharmacy Consult for Electrolyte Monitoring and Replacement   Recent Labs: Potassium (mmol/L)  Date Value  07/12/2021 3.4 (L)   Magnesium (mg/dL)  Date Value  67/06/4579 1.5 (L)   Calcium (mg/dL)  Date Value  99/83/3825 8.2 (L)   Albumin (g/dL)  Date Value  05/39/7673 3.2 (L)  04/23/2021 4.4   Phosphorus (mg/dL)  Date Value  41/93/7902 3.7   Sodium (mmol/L)  Date Value  07/12/2021 135  05/28/2021 140     Assessment: 66 year old male with known right kidney stone originally planned for lithotripsy as outpatient. Patient presented with AMS; concern for septic shock in the setting of suspected pyelonephritis. Patient underwent cystoscopy with stent placement 1/12. AKI in the setting of above. Patient is in the ICU weaning off vasopressors. Pharmacy consulted for electrolyte management.  Goal of Therapy:  Electrolytes WNL  Plan:  --Potassium 20 mEq PO + magnesium 2 g IV --Follow up with morning labs  Pricilla Riffle, PharmD, BCPS Clinical Pharmacist 07/12/2021 11:56 AM

## 2021-07-12 NOTE — Anesthesia Preprocedure Evaluation (Addendum)
Anesthesia Evaluation  Patient identified by MRN, date of birth, ID band Patient awake  General Assessment Comment:  Urosepsis, on norepinephrine.  Patient AOx3.  Has increased respiratory effort and audible wheezing; patient says it has been this way for two days. CXR and CT scan of chest negative for any pneumonia, just showing atelactasis and COPD changes. Not hypoxic.  Reviewed: Allergy & Precautions, NPO status , Patient's Chart, lab work & pertinent test results  History of Anesthesia Complications Negative for: history of anesthetic complications  Airway Mallampati: II  TM Distance: >3 FB Neck ROM: Full    Dental no notable dental hx. (+) Teeth Intact   Pulmonary sleep apnea , neg COPD, Current Smoker and Patient abstained from smoking.,     + decreased breath sounds+ wheezing  rales    Cardiovascular Exercise Tolerance: Good METShypertension, + CAD, + Past MI, + Cardiac Stents and +CHF  (-) dysrhythmias  Rhythm:Regular Rate:Tachycardia - Systolic murmurs TTE 2355 (unchanged from 5 years prior)  1. Left ventricular ejection fraction, by estimation, is 30 to 35%. The  left ventricle has moderately decreased function. The left ventricle  demonstrates regional wall motion abnormalities (see scoring  diagram/findings for description). The left  ventricular internal cavity size was mildly to moderately dilated. Left  ventricular diastolic parameters are consistent with Grade I diastolic  dysfunction (impaired relaxation).  2. Right ventricular systolic function is normal. The right ventricular  size is mildly enlarged. Tricuspid regurgitation signal is inadequate for  assessing PA pressure.  3. The mitral valve is grossly normal. Mild mitral valve regurgitation.  No evidence of mitral stenosis.  4. The aortic valve is tricuspid. Aortic valve regurgitation is not  visualized. No aortic stenosis is present.  5. The  inferior vena cava is normal in size with greater than 50%  respiratory variability, suggesting right atrial pressure of 3 mmHg.    Neuro/Psych negative neurological ROS  negative psych ROS   GI/Hepatic neg GERD  ,(+)     (-) substance abuse  ,   Endo/Other  neg diabetes  Renal/GU ARFRenal disease     Musculoskeletal   Abdominal   Peds  Hematology   Anesthesia Other Findings Past Medical History: No date: Bronchitis     Comment:  CHRONIC No date: Chronic kidney disease     Comment:  stone 2013: Coronary artery disease     Comment:  BMS to LAD 04/01/2017: Dysrhythmia     Comment:  ekg showed changes from previous ekgs. cleared for               surgery by cardiology No date: Erectile dysfunction No date: History of kidney stones No date: HOH (hard of hearing) No date: Hypercholesterolemia No date: Hypertension No date: Ischemic cardiomyopathy     Comment:  EF 25-30%  06/19/2012: MI (myocardial infarction) ([place])     Comment:  being considered for defibilator but patient now refuses              this. 03/2015: NSTEMI (non-ST elevated myocardial infarction) ([place])     Comment:  Patent BMS - LAD stent. No date: Sleep apnea No date: Varicose veins  Reproductive/Obstetrics                            Anesthesia Physical Anesthesia Plan  ASA: 4 and emergent  Anesthesia Plan: MAC   Post-op Pain Management: Ofirmev IV (intra-op) and Ketamine IV   Induction: Intravenous  PONV  Risk Score and Plan: 1 and Ondansetron and Midazolam  Airway Management Planned: Natural Airway and Oral ETT  Additional Equipment: None  Intra-op Plan:   Post-operative Plan: Extubation in OR and Possible Post-op intubation/ventilation  Informed Consent: I have reviewed the patients History and Physical, chart, labs and discussed the procedure including the risks, benefits and alternatives for the proposed anesthesia with the patient or authorized representative  who has indicated his/her understanding and acceptance.     Dental advisory given  Plan Discussed with: CRNA and Surgeon  Anesthesia Plan Comments: (Discussed risks of anesthesia with patient, including PONV, sore throat, lip/dental/eye damage. Rare risks discussed as well, such as cardiorespiratory and neurological sequelae, and allergic reactions. Discussed the role of CRNA in patient's perioperative care. Patient understands. Patient counseled on being higher risk for anesthesia due to comorbidities: CHF, urosepsis, respiratory distress. Patient was told about increased risk of cardiac and respiratory events, including death.  Plan for natural airway, MAC anesthesia, possible GETA and post op ventilation. NPO appropriate, no nausea. Plan for duoneb preop.)        Anesthesia Quick Evaluation

## 2021-07-12 NOTE — H&P (Signed)
Date of Initial H&P: 07/12/20 Patient has obstructing right ureteral stone with urosepsis requiring pressors. No stable enough to proceed with R stent placement. Also see note by Webb Silversmith, DNP, CCRN, FNP-C, AGACNP-BC Acute Care Nurse Practitioner. History reviewed, patient examined, no change in status, stable for surgery.

## 2021-07-12 NOTE — Procedures (Signed)
Central Venous Catheter Insertion Procedure Note  [name] Walker  [name]  [birth year]  Date:07/12/21  Time:5:23 AM   Provider Performing:Jennika Ringgold [name]   Procedure: Insertion of Non-tunneled Central Venous [phone]) with US guidance [id number])   Indication(s) Medication administration and Difficult access  Consent Risks of the procedure as well as the alternatives and risks of each were explained to the patient and/or caregiver.  Consent for the procedure was obtained and is signed in the bedside chart  Anesthesia Topical only with 1% lidocaine   Timeout Verified patient identification, verified procedure, site/side was marked, verified correct patient position, special equipment/implants available, medications/allergies/relevant history reviewed, required imaging and test results available.  Sterile Technique Maximal sterile technique including full sterile barrier drape, hand hygiene, sterile gown, sterile gloves, mask, hair covering, sterile ultrasound probe cover (if used).  Procedure Description Area of catheter insertion was cleaned with chlorhexidine and draped in sterile fashion.  With real-time ultrasound guidance a central venous catheter was placed into the right internal jugular vein. Nonpulsatile blood flow and easy flushing noted in all ports.  The catheter was sutured in place and sterile dressing applied.  Complications/Tolerance None; patient tolerated the procedure well. Chest X-ray is ordered to verify placement for internal jugular or subclavian cannulation.   Chest x-ray is not ordered for femoral cannulation.  EBL Minimal  Specimen(s) None   Rufina Falco, DNP, CCRN, FNP-C, AGACNP-BC Acute Care Nurse Practitioner  [facility] Pager: [phone] [place] at [facility]

## 2021-07-13 ENCOUNTER — Telehealth: Payer: Self-pay | Admitting: *Deleted

## 2021-07-13 ENCOUNTER — Encounter: Payer: Self-pay | Admitting: Nurse Practitioner

## 2021-07-13 DIAGNOSIS — N179 Acute kidney failure, unspecified: Secondary | ICD-10-CM

## 2021-07-13 DIAGNOSIS — N139 Obstructive and reflux uropathy, unspecified: Secondary | ICD-10-CM

## 2021-07-13 DIAGNOSIS — I480 Paroxysmal atrial fibrillation: Secondary | ICD-10-CM

## 2021-07-13 DIAGNOSIS — R6521 Severe sepsis with septic shock: Secondary | ICD-10-CM

## 2021-07-13 DIAGNOSIS — Z720 Tobacco use: Secondary | ICD-10-CM

## 2021-07-13 LAB — BLOOD CULTURE ID PANEL (REFLEXED) - BCID2

## 2021-07-13 LAB — PROTIME-INR
INR: 1.1 (ref 0.8–1.2)
Prothrombin Time: 13.8 seconds (ref 11.4–15.2)

## 2021-07-13 LAB — LEGIONELLA PNEUMOPHILA SEROGP 1 UR AG: L. pneumophila Serogp 1 Ur Ag: NEGATIVE

## 2021-07-13 LAB — BASIC METABOLIC PANEL
Anion gap: 7 (ref 5–15)
BUN: 34 mg/dL — ABNORMAL HIGH (ref 8–23)
CO2: 24 mmol/L (ref 22–32)
Calcium: 8.5 mg/dL — ABNORMAL LOW (ref 8.9–10.3)
Chloride: 107 mmol/L (ref 98–111)
Creatinine, Ser: 1.23 mg/dL (ref 0.61–1.24)
GFR, Estimated: >60 mL/min (ref >60)
Glucose, Bld: 142 mg/dL — ABNORMAL HIGH (ref 70–99)
Potassium: 3.1 mmol/L — ABNORMAL LOW (ref 3.5–5.1)
Sodium: 138 mmol/L (ref 135–145)

## 2021-07-13 LAB — HEPARIN LEVEL (UNFRACTIONATED): Heparin Unfractionated: <0.1 IU/mL — ABNORMAL LOW (ref 0.30–0.70)

## 2021-07-13 LAB — APTT: aPTT: 31 seconds (ref 24–36)

## 2021-07-13 LAB — TSH: TSH: 0.072 u[IU]/mL — ABNORMAL LOW (ref 0.350–4.500)

## 2021-07-13 LAB — MAGNESIUM: Magnesium: 2.1 mg/dL (ref 1.7–2.4)

## 2021-07-13 MED ORDER — HEPARIN BOLUS VIA INFUSION
3000.0000 [IU] | Freq: Once | INTRAVENOUS | Status: AC
  Administered 2021-07-13: 3000 [IU] via INTRAVENOUS
  Filled 2021-07-13: qty 3000

## 2021-07-13 MED ORDER — AMIODARONE HCL IN DEXTROSE 360-4.14 MG/200ML-% IV SOLN
60.0000 mg/h | INTRAVENOUS | Status: DC
  Administered 2021-07-13 (×2): 60 mg/h via INTRAVENOUS
  Filled 2021-07-13 (×2): qty 200

## 2021-07-13 MED ORDER — AMIODARONE HCL IN DEXTROSE 360-4.14 MG/200ML-% IV SOLN: 30.0000 mg/h | INTRAVENOUS | Status: DC

## 2021-07-13 MED ORDER — LOPERAMIDE HCL 2 MG PO CAPS
4.0000 mg | ORAL_CAPSULE | ORAL | Status: DC | PRN
  Administered 2021-07-13: 4 mg via ORAL
  Filled 2021-07-13: qty 2

## 2021-07-13 MED ORDER — POTASSIUM CHLORIDE CRYS ER 20 MEQ PO TBCR
40.0000 meq | EXTENDED_RELEASE_TABLET | Freq: Once | ORAL | Status: AC
  Administered 2021-07-13: 40 meq via ORAL
  Filled 2021-07-13: qty 2

## 2021-07-13 MED ORDER — HEPARIN (PORCINE) 25000 UT/250ML-% IV SOLN
1900.0000 [IU]/h | INTRAVENOUS | Status: DC
  Administered 2021-07-13: 1500 [IU]/h via INTRAVENOUS
  Administered 2021-07-13: 1300 [IU]/h via INTRAVENOUS
  Administered 2021-07-14 – 2021-07-16 (×4): 1900 [IU]/h via INTRAVENOUS
  Filled 2021-07-13 (×6): qty 250

## 2021-07-13 MED ORDER — LACTATED RINGERS IV BOLUS
500.0000 mL | Freq: Once | INTRAVENOUS | Status: AC
  Administered 2021-07-13: 500 mL via INTRAVENOUS

## 2021-07-13 MED ORDER — HEPARIN BOLUS VIA INFUSION
5700.0000 [IU] | Freq: Once | INTRAVENOUS | Status: AC
  Administered 2021-07-13: 5700 [IU] via INTRAVENOUS
  Filled 2021-07-13: qty 5700

## 2021-07-13 MED ORDER — IPRATROPIUM-ALBUTEROL 0.5-2.5 (3) MG/3ML IN SOLN
3.0000 mL | Freq: Three times a day (TID) | RESPIRATORY_TRACT | Status: DC
  Administered 2021-07-13 – 2021-07-16 (×8): 3 mL via RESPIRATORY_TRACT
  Filled 2021-07-13 (×8): qty 3

## 2021-07-13 MED ORDER — AMIODARONE HCL 200 MG PO TABS
400.0000 mg | ORAL_TABLET | Freq: Two times a day (BID) | ORAL | Status: DC
  Administered 2021-07-13 – 2021-07-16 (×6): 400 mg via ORAL
  Filled 2021-07-13 (×6): qty 2

## 2021-07-13 MED ORDER — AMIODARONE LOAD VIA INFUSION
150.0000 mg | Freq: Once | INTRAVENOUS | Status: AC
  Administered 2021-07-13: 150 mg via INTRAVENOUS
  Filled 2021-07-13: qty 83.34

## 2021-07-13 MED ORDER — AMIODARONE IV BOLUS ONLY 150 MG/100ML
150.0000 mg | Freq: Once | INTRAVENOUS | Status: AC
  Administered 2021-07-13: 150 mg via INTRAVENOUS
  Filled 2021-07-13: qty 100

## 2021-07-13 NOTE — Progress Notes (Signed)
ANTICOAGULATION CONSULT NOTE  Pharmacy Consult for heparin Indication: atrial fibrillation  Allergies  Allergen Reactions   Nucynta [Tapentadol] Nausea Only and Other (See Comments)    Dizziness, woozy.  Sick to his stomach   Codeine Other (See Comments)    Too drowsy    Patient Measurements: Height: 5\' 11"  (180.3 cm) Weight: 101.2 kg (223 lb 1.7 oz) IBW/kg (Calculated) : 75.3 Heparin Dosing Weight: 96 kg  Vital Signs: Temp: 98.5 F (36.9 C) (01/13 1256) Temp Source: Oral (01/13 1256) BP: 122/84 (01/13 1700) Pulse Rate: 64 (01/13 1700)  Labs: Recent Labs    07/11/21 2147 07/12/21 0022 07/12/21 0422 07/13/21 0338 07/13/21 0927 07/13/21 1620  HGB 13.5  --  12.6*  --   --   --   HCT 38.0*  --  35.4*  --   --   --   PLT 105*  --  96*  --   --   --   APTT  --   --   --   --  31  --   LABPROT  --   --   --   --  13.8  --   INR  --   --   --   --  1.1  --   HEPARINUNFRC  --   --   --   --   --  <0.10*  CREATININE 3.55*  --  2.76* 1.23  --   --   TROPONINIHS 57* 50*  --   --   --   --     Estimated Creatinine Clearance: 73.5 mL/min (by C-G formula based on SCr of 1.23 mg/dL).  Medical History: Past Medical History:  Diagnosis Date   Bronchitis    CHRONIC   Chronic HFrEF (heart failure with reduced ejection fraction) (HCC)    a. 03/2021 Echo: EF 30-35%, GrI DD.   Coronary artery disease    a. 05/2012 Ant STEMI/PCI: LAD 100p (thrombectomy and 3.5x20 Veriflex BMS); b. 12/2014 Cath: patent stent; c. 03/2015 Cath: LM nl, LAD 10 ISR, 59m, D1 50p, LCX 20 diff throughout, RCA nl-->Med rx.   Erectile dysfunction    History of kidney stones    HOH (hard of hearing)    Hypercholesterolemia    Hypertension    Ischemic cardiomyopathy    a. 11/2015 Echo: EF 30-35%, diff HK; b. Refused ICD; c. 03/2021 Echo: EF 30-35%, GrI DD, nl RV fxn, mild MR.   MI (myocardial infarction) (HCC) 06/19/2012   being considered for defibilator but patient now refuses this.   Nephrolithiasis     NSTEMI (non-ST elevated myocardial infarction) (HCC) 03/2015   Patent BMS - LAD stent.   Sleep apnea    Tobacco abuse    Varicose veins      Assessment: 66 year old male with known right kidney stone originally planned for lithotripsy as outpatient. Patient presented with AMS; concern for septic shock in the setting of suspected pyelonephritis. Patient underwent cystoscopy with stent placement 1/12. AKI in the setting of above, now improved. New onset atrial fibrillation, started on an amiodarone infusion. Pharmacy consult for heparin management.  Goal of Therapy:  Heparin level 0.3-0.7 units/ml Monitor platelets by anticoagulation protocol: Yes  Date/time  Level  Comment 1/13 @ 1620   < 0.10  Subtherapeutic @1300  un/hr (resulted at 2111)   Plan:  --Heparin 3000 unit bolus, then increase rate to 1500 units/hr --Check HL 6hrs after rate change --CBC daily while on heparin  Keyetta Hollingworth Rodriguez-Guzman PharmD, BCPS  07/13/2021 9:34 PM

## 2021-07-13 NOTE — Progress Notes (Signed)
PHARMACY - PHYSICIAN COMMUNICATION CRITICAL VALUE ALERT - BLOOD CULTURE IDENTIFICATION (BCID)  [name] Walker is an 66 y.o. male who presented to [facility] on 07/11/2021 with a chief complaint of confusion, fever, dizziness  Assessment:  Blood culture from 1/11 with GPC in 1 of 4 bottles.  He is on ceftriaxone and azithromycin possible pneumonia.  He also has concerns for pyelonephritis d/t obstructive ureteral stone s/p stent placement 1/12.   Name of physician (or Provider) Contacted: Dr Sunnie Nielsen  Current antibiotics: Ceftriaxone and azithromycin   Changes to prescribed antibiotics recommended:  Patient is on recommended antibiotics - No changes needed. Blood culture likely contaminant monitor on current antibiotic  Results for orders placed or performed during the hospital encounter of 07/11/21  Blood Culture ID Panel (Reflexed) (Collected: 07/11/2021  9:47 PM)  Result Value Ref Range   Enterococcus faecalis NOT DETECTED NOT DETECTED   Enterococcus Faecium NOT DETECTED NOT DETECTED   Listeria monocytogenes NOT DETECTED NOT DETECTED   Staphylococcus species DETECTED (A) NOT DETECTED   Staphylococcus aureus (BCID) NOT DETECTED NOT DETECTED   Staphylococcus epidermidis DETECTED (A) NOT DETECTED   Staphylococcus lugdunensis NOT DETECTED NOT DETECTED   Streptococcus species NOT DETECTED NOT DETECTED   Streptococcus agalactiae NOT DETECTED NOT DETECTED   Streptococcus pneumoniae NOT DETECTED NOT DETECTED   Streptococcus pyogenes NOT DETECTED NOT DETECTED   A.calcoaceticus-baumannii NOT DETECTED NOT DETECTED   Bacteroides fragilis NOT DETECTED NOT DETECTED   Enterobacterales NOT DETECTED NOT DETECTED   Enterobacter cloacae complex NOT DETECTED NOT DETECTED   Escherichia coli NOT DETECTED NOT DETECTED   Klebsiella aerogenes NOT DETECTED NOT DETECTED   Klebsiella oxytoca NOT DETECTED NOT DETECTED   Klebsiella pneumoniae NOT DETECTED NOT DETECTED   Proteus species NOT DETECTED NOT  DETECTED   Salmonella species NOT DETECTED NOT DETECTED   Serratia marcescens NOT DETECTED NOT DETECTED   Haemophilus influenzae NOT DETECTED NOT DETECTED   Neisseria meningitidis NOT DETECTED NOT DETECTED   Pseudomonas aeruginosa NOT DETECTED NOT DETECTED   Stenotrophomonas maltophilia NOT DETECTED NOT DETECTED   Candida albicans NOT DETECTED NOT DETECTED   Candida auris NOT DETECTED NOT DETECTED   Candida glabrata NOT DETECTED NOT DETECTED   Candida krusei NOT DETECTED NOT DETECTED   Candida parapsilosis NOT DETECTED NOT DETECTED   Candida tropicalis NOT DETECTED NOT DETECTED   Cryptococcus neoformans/gattii NOT DETECTED NOT DETECTED   Methicillin resistance mecA/C NOT DETECTED NOT DETECTED    Juliette Alcide, PharmD, BCPS, BCIDP Work Cell: [phone] 07/13/2021 2:09 PM

## 2021-07-13 NOTE — Consult Note (Signed)
1 Day Post-Op Subjective: Patient reports no flank pain.  Voiding well.  Tolerating diet.  Renal function is now normal.  Objective: Vital signs in last 24 hours: Temp:  [97.8 F (36.6 C)-98.6 F (37 C)] 98.5 F (36.9 C) (01/13 1256) Pulse Rate:  [26-152] 63 (01/13 1630) Resp:  [13-23] 18 (01/13 1630) BP: (82-139)/(48-94) 105/69 (01/13 1630) SpO2:  [90 %-98 %] 94 % (01/13 1630) Weight:  [101.2 kg] 101.2 kg (01/13 0338)  Intake/Output from previous day: 01/12 0701 - 01/13 0700 In: 1417.3 [P.O.:240; I.V.:527.2; IV Piggyback:650.1] Out: [id number] [Urine:3750] Intake/Output this shift: Total I/O In: 1088.3 [I.V.:483.6; IV Piggyback:604.7] Out: 300 [Urine:300]  Physical Exam:  General: Alert and oriented x3 GI: No CVA tenderness.   Lab Results: Recent Labs    07/11/21 2147 07/12/21 0422  HGB 13.5 12.6*  HCT 38.0* 35.4*   BMET Recent Labs    07/12/21 0422 07/13/21 0338  NA 135 138  K 3.4* 3.1*  CL 104 107  CO2 19* 24  GLUCOSE 127* 142*  BUN 49* 34*  CREATININE 2.76* 1.23  CALCIUM 8.2* 8.5*   Recent Labs    07/13/21 0927  INR 1.1   No results for input(s): LABURIN in the last 72 hours. Results for orders placed or performed during the hospital encounter of 07/11/21  Resp Panel by RT-PCR (Flu A&B, Covid) Nasopharyngeal Swab     Status: None   Collection Time: 07/11/21  8:03 PM   Specimen: Nasopharyngeal Swab; Nasopharyngeal(NP) swabs in vial transport medium  Result Value Ref Range Status   SARS Coronavirus 2 by RT PCR NEGATIVE NEGATIVE Final    Comment: (NOTE) SARS-CoV-2 target nucleic acids are NOT DETECTED.  The SARS-CoV-2 RNA is generally detectable in upper respiratory specimens during the acute phase of infection. The lowest concentration of SARS-CoV-2 viral copies this assay can detect is 138 copies/mL. A negative result does not preclude SARS-Cov-2 infection and should not be used as the sole basis for treatment or other patient management decisions.  A negative result may occur with  improper specimen collection/handling, submission of specimen other than nasopharyngeal swab, presence of viral mutation(s) within the areas targeted by this assay, and inadequate number of viral copies(<138 copies/mL). A negative result must be combined with clinical observations, patient history, and epidemiological information. The expected result is Negative.  Fact Sheet for Patients:  EntrepreneurPulse.com.au  Fact Sheet for Healthcare Providers:  IncredibleEmployment.be  This test is no t yet approved or cleared by the [place] FDA and  has been authorized for detection and/or diagnosis of SARS-CoV-2 by FDA under an Emergency Use Authorization (EUA). This EUA will remain  in effect (meaning this test can be used) for the duration of the COVID-19 declaration under Section 564(b)(1) of the Act, 21 U.S.C.section 360bbb-3(b)(1), unless the authorization is terminated  or revoked sooner.       Influenza A by PCR NEGATIVE NEGATIVE Final   Influenza B by PCR NEGATIVE NEGATIVE Final    Comment: (NOTE) The Xpert Xpress SARS-CoV-2/FLU/RSV plus assay is intended as an aid in the diagnosis of influenza from Nasopharyngeal swab specimens and should not be used as a sole basis for treatment. Nasal washings and aspirates are unacceptable for Xpert Xpress SARS-CoV-2/FLU/RSV testing.  Fact Sheet for Patients: EntrepreneurPulse.com.au  Fact Sheet for Healthcare Providers: IncredibleEmployment.be  This test is not yet approved or cleared by the [place] FDA and has been authorized for detection and/or diagnosis of SARS-CoV-2 by FDA under an Emergency Use Authorization (EUA).  This EUA will remain in effect (meaning this test can be used) for the duration of the COVID-19 declaration under Section 564(b)(1) of the Act, 21 U.S.C. section 360bbb-3(b)(1), unless the authorization  is terminated or revoked.  Performed at [facility], [address] [place], [place] [address]   Blood culture (routine x 2)     Status: None (Preliminary result)   Collection Time: 07/11/21  9:47 PM   Specimen: BLOOD  Result Value Ref Range Status   Specimen Description BLOOD LEFT HAND  Final   Special Requests   Final    BOTTLES DRAWN AEROBIC AND ANAEROBIC Blood Culture adequate volume   Culture  Setup Time   Final    Organism ID to follow GRAM POSITIVE COCCI ANAEROBIC BOTTLE ONLY CRITICAL RESULT CALLED TO, READ BACK BY AND VERIFIED WITH: BRANDON BEERS AT [id number] 07/13/21.PMF Performed at [facility], [address] [place], [place] [address]    Culture [facility] POSITIVE COCCI  Final   Report Status PENDING  Incomplete  Blood culture (routine x 2)     Status: None (Preliminary result)   Collection Time: 07/11/21  9:47 PM   Specimen: BLOOD  Result Value Ref Range Status   Specimen Description BLOOD RIGHT ASSIST CONTROL  Final   Special Requests   Final    BOTTLES DRAWN AEROBIC AND ANAEROBIC Blood Culture results may not be optimal due to an excessive volume of blood received in culture bottles   Culture   Final    NO GROWTH 2 DAYS Performed at [facility], [address] [place], [place] [address]    Report Status PENDING  Incomplete  Blood Culture ID Panel (Reflexed)     Status: Abnormal   Collection Time: 07/11/21  9:47 PM  Result Value Ref Range Status   Enterococcus faecalis NOT DETECTED NOT DETECTED Final   Enterococcus Faecium NOT DETECTED NOT DETECTED Final   Listeria monocytogenes NOT DETECTED NOT DETECTED Final   Staphylococcus species DETECTED (A) NOT DETECTED Final    Comment: CRITICAL RESULT CALLED TO, READ BACK BY AND VERIFIED WITH: BRANDON BEERS AT 1332 07/13/21.PMF    Staphylococcus aureus (BCID) NOT DETECTED NOT DETECTED Final   Staphylococcus epidermidis DETECTED (A) NOT DETECTED Final    Comment: CRITICAL RESULT CALLED TO,  READ BACK BY AND VERIFIED WITH: BRANDON BEERS AT 1332 07/13/21.PMF    Staphylococcus lugdunensis NOT DETECTED NOT DETECTED Final   Streptococcus species NOT DETECTED NOT DETECTED Final   Streptococcus agalactiae NOT DETECTED NOT DETECTED Final   Streptococcus pneumoniae NOT DETECTED NOT DETECTED Final   Streptococcus pyogenes NOT DETECTED NOT DETECTED Final   A.calcoaceticus-baumannii NOT DETECTED NOT DETECTED Final   Bacteroides fragilis NOT DETECTED NOT DETECTED Final   Enterobacterales NOT DETECTED NOT DETECTED Final   Enterobacter cloacae complex NOT DETECTED NOT DETECTED Final   Escherichia coli NOT DETECTED NOT DETECTED Final   Klebsiella aerogenes NOT DETECTED NOT DETECTED Final   Klebsiella oxytoca NOT DETECTED NOT DETECTED Final   Klebsiella pneumoniae NOT DETECTED NOT DETECTED Final   Proteus species NOT DETECTED NOT DETECTED Final   Salmonella species NOT DETECTED NOT DETECTED Final   Serratia marcescens NOT DETECTED NOT DETECTED Final   Haemophilus influenzae NOT DETECTED NOT DETECTED Final   Neisseria meningitidis NOT DETECTED NOT DETECTED Final   Pseudomonas aeruginosa NOT DETECTED NOT DETECTED Final   Stenotrophomonas maltophilia NOT DETECTED NOT DETECTED Final   Candida albicans NOT DETECTED NOT DETECTED Final   Candida auris NOT DETECTED  NOT DETECTED Final   Candida glabrata NOT DETECTED NOT DETECTED Final   Candida krusei NOT DETECTED NOT DETECTED Final   Candida parapsilosis NOT DETECTED NOT DETECTED Final   Candida tropicalis NOT DETECTED NOT DETECTED Final   Cryptococcus neoformans/gattii NOT DETECTED NOT DETECTED Final   Methicillin resistance mecA/C NOT DETECTED NOT DETECTED Final    Comment: Performed at [facility], [address], [place], [place] [address]  Urine Culture     Status: None   Collection Time: 07/11/21 11:08 PM   Specimen: Urine, Random  Result Value Ref Range Status   Specimen Description   Final    URINE, RANDOM Performed at  [facility], [address] [place], [place] [address]    Special Requests   Final    NONE Performed at [facility], [address] [place], [place] [address]    Culture   Final    NO GROWTH Performed at [facility], [place] [address] [place], [place] [address]    Report Status 07/12/2021 FINAL  Final  MRSA Next Gen by PCR, Nasal     Status: None   Collection Time: 07/12/21  2:56 AM   Specimen: Nasal Mucosa; Nasal Swab  Result Value Ref Range Status   MRSA by PCR Next Gen NOT DETECTED NOT DETECTED Final    Comment: (NOTE) The GeneXpert MRSA Assay (FDA approved for NASAL specimens only), is one component of a comprehensive MRSA colonization surveillance program. It is not intended to diagnose MRSA infection nor to guide or monitor treatment for MRSA infections. Test performance is not FDA approved in patients less than 41 years old. Performed at [facility], [address] [place], [place] [address]     Studies/Results: DG Chest Port 1 View  Result Date: 07/12/2021 CLINICAL DATA:  Central line placement EXAM: PORTABLE CHEST 1 VIEW COMPARISON:  Yesterday FINDINGS: New right IJ line with tip at the SVC. No pneumothorax or new mediastinal widening. Interstitial coarsening which is similar to prior. COPD by CT. Normal heart size. No effusion or consolidation. IMPRESSION: New central line without complicating feature. Stable aeration since yesterday. Electronically Signed   By: Jorje Guild M.D.   On: 07/12/2021 05:37   DG Chest Portable 1 View  Result Date: 07/11/2021 CLINICAL DATA:  Shortness of breath EXAM: PORTABLE CHEST 1 VIEW COMPARISON:  03/11/2015 FINDINGS: Central airways thickening with possible developing patchy infiltrate at left base. Normal cardiac size. No pneumothorax. Aortic atherosclerosis IMPRESSION: Central airways thickening suggesting bronchitis, with possible developing infiltrate at left base. Electronically Signed   By:  Donavan Foil M.D.   On: 07/11/2021 22:11   DG OR UROLOGY CYSTO IMAGE ([facility] ONLY)  Result Date: 07/12/2021 There is no interpretation for this exam.  This order is for images obtained during a surgical procedure.  Please See "Surgeries" Tab for more information regarding the procedure.   CT CHEST ABDOMEN PELVIS WO CONTRAST  Addendum Date: 07/12/2021   ADDENDUM REPORT: 07/12/2021 04:21 ADDENDUM: This addendum is requested by urology. The 10 mm proximal right ureteral stone described above is obstructing, causing severe right hydronephrosis with perinephric stranding. Electronically Signed   By: Telford Nab M.D.   On: 07/12/2021 04:21   Result Date: 07/12/2021 CLINICAL DATA:  Pneumonia, complication suspected, xray done. Fever. Altered mental status. Shortness of breath. EXAM: CT CHEST, ABDOMEN AND PELVIS WITHOUT CONTRAST TECHNIQUE: Multidetector CT imaging of the chest, abdomen and pelvis was performed following the standard protocol without IV  contrast. RADIATION DOSE REDUCTION: This exam was performed according to the departmental dose-optimization program which includes automated exposure control, adjustment of the mA and/or kV according to patient size and/or use of iterative reconstruction technique. COMPARISON:  Chest x-ray today.  CT 06/26/2021 FINDINGS: CT CHEST FINDINGS Cardiovascular: Heart is normal size. Aorta is normal caliber. Moderate coronary artery and scattered aortic calcifications. Mediastinum/Nodes: No mediastinal, hilar, or axillary adenopathy. Trachea and esophagus are unremarkable. Thyroid unremarkable. Lungs/Pleura: Paraseptal and centrilobular emphysema. Predominantly linear opacities at the lung bases, favor atelectasis. No effusions. Musculoskeletal: Chest wall soft tissues are unremarkable. No acute bony abnormality. CT ABDOMEN PELVIS FINDINGS Hepatobiliary: No focal hepatic abnormality. Gallbladder unremarkable. Pancreas: No focal abnormality or ductal dilatation. Spleen: No  focal abnormality.  Normal size. Adrenals/Urinary Tract: Severe right hydronephrosis and perinephric stranding due to 10 mm proximal right ureteral stone. No stones or hydronephrosis on the left. Adrenal glands and urinary bladder unremarkable. Stomach/Bowel: Sigmoid diverticulosis. No active diverticulitis. Stomach and small bowel decompressed, unremarkable. Vascular/Lymphatic: Aortic atherosclerosis. No evidence of aneurysm or adenopathy. Reproductive: No visible focal abnormality. Other: No free fluid or free air. Small bilateral inguinal hernias containing fat. Musculoskeletal: No acute bony abnormality. IMPRESSION: Coronary artery disease, aortic atherosclerosis. Bibasilar opacities, favor atelectasis. 10 mm proximal right ureteral stone with severe right hydronephrosis and perinephric stranding. Aortic atherosclerosis. Sigmoid diverticulosis. Electronically Signed: By: Rolm Baptise M.D. On: 07/12/2021 00:56    Assessment/Plan:  1.  Right hydronephrosis due to 10 mm obstructing ureteral stone with hydronephrosis status post emergent stent placement:   Follow-up in the office after discharge and plan lithotripsy when medically stable.        2.  Questionable urosepsis-urine cultures are negative   LOS: 2 days   Royston Cowper 07/13/2021, 5:06 PM

## 2021-07-13 NOTE — Telephone Encounter (Signed)
Patient is currently admitted to the hospital with sepsis and rapid afib. I discussed with our hospital staff. This clearance can be addressed as inpatient or on the post hospital follow up. Will remove from the pool

## 2021-07-13 NOTE — Telephone Encounter (Signed)
° °  Pre-operative Risk Assessment    Patient Name: [name] Walker  DOB: January 21, 1956 MRN: [name]      Request for Surgical Clearance    Procedure:   LITHOTRIPSY   Date of Surgery:  Clearance TBD                                 Surgeon:  DR. Nash Mantis Surgeon's Group or Practice Name:   [place] Phone number:   [phone] Fax number:   [phone]   Type of Clearance Requested:   - Medical    Type of Anesthesia:  Not Indicated   Additional requests/questions:      Astrid Divine   07/13/2021, 3:06 PM

## 2021-07-13 NOTE — Progress Notes (Signed)
PROGRESS NOTE    [name] Walker  [id number] DOB: Nov 04, 1955 DOA: 07/11/2021 PCP: Tracie Harrier, MD   Brief Narrative: 66 year old with past medical history significant for CAD, history of anterior MI in 2013 secondary to occluded proximal LAD that was treated with PCI stents, ischemic cardiomyopathy with ejection fraction [id number], chronic systolic heart failure, ongoing tobacco abuse, hypertension, OSA, chronic kidney stones presented with altered mental status.  Per family patient developed fevers and associated right flank pain the day prior to admission.  He started to have nausea vomiting 2 days prior to admission, also hematuria.  Patient was admitted by critical care team for sepsis secondary to obstructive uropathy, UTI, systolic blood pressure in the 60.  AKI with a creatinine of 3.5.  CT chest abdomen and pelvis showed obstructing 10 mm proximal right ureteral stone with severe right hydronephrosis and perinephric stranding.  Patient was a started on IV fluids, broad-spectrum IV antibiotic, Levophed.  He underwent cystoscopy with right stent placement on 1/12 by Dr. Yves Dill.  His care was transferred to [facility] 1[facility].  He develops A fib with RVR with low blood pressure.  Cardiology was consulted, patient was started on IV amiodarone, and heparin drip.   Assessment & Plan:   Principal Problem:   Sepsis with hypotension ([place]) Active Problems:   Tobacco abuse   Chronic systolic heart failure ([facility])   Dyslipidemia   Kidney stone on right side   AKI (acute kidney injury) ([place])   Acute unilateral obstructive uropathy   1-Sepsis with Septic shock secondary to acute obstructive pyelonephritis and possible underlying pneumonia: -Patient admitted to the ICU, he received IV fluids, broad-spectrum antibiotics and will on IV pressors -He underwent cystoscopy with right stent placement on 1/12 by Dr. Eliberto Ivory. -Continue with IV ceftriaxone and azithromycin. -1 out of 4 blood cultures positive  for staph epi.  Ceftriaxone should cover.  This is likely a contaminant.  Plan to repeat blood cultures. -Urine culture no growth to date.  -On IV stress dose steroids.  -Started on Midodrine.   2-AKI secondary to acute obstructing uropathy secondary to right ureteral lithiasis -Patient underwent cystoscopy and right ureteral stent placement 1/12 -Renal function significantly improved, creatinine peak to 2.5, it has decreased to 1.2. -He received IV fluids. -He will need to follow-up with Dr. Yves Dill  for lithotripsy and ureteral stent removal  3-A fib RVR, with Hypotension;  -He was on Coreg at home, unable to do beta-blockers due to hypotension -He was a started on IV amiodarone drip. -He was also started on heparin drip. -Cardiology consulted. -Blood pressure continues to be low, cardiology was contacted and patient received a bolus of IV amiodarone to improve rate control and subsequently improved blood pressure  4--Hypokalemia: Replete orally 5--Hypomagnesemia: Replaced.  Magnesium 2.1 this morning.  6-Acute on chronic hypoxic respiratory failure secondary to possible pneumonia, AECOPD  -Past medical history COPD, current tobacco abuse, OSA: Continue with  oxygen supplementation. Continue with incentive spirometry. Continue with nebulizer, Pulmicort.  On IV antibiotics for PNA>   7-Chronic systolic heart failure, last known ejection fraction 35% History of hypertension, CAD anterior MI 2013 s/p PCI to LAD.  Ischemic cardiomyopathy, HLD -Continue to hold Carvedilol and Entresto in the setting of hypotension.  8-Acute metabolic encephalopathy due to sepsis: Improved.     Estimated body mass index is 31.12 kg/m as calculated from the following:   Height as of this encounter: 5\' 11"  (1.803 m).   Weight as of this encounter: 101.2 kg.  DVT prophylaxis: Heparin gtt Code Status: Full Code Family Communication: care discussed with patient.  Disposition Plan:  Status is:  Inpatient  Remains inpatient appropriate because: Required IV antibiotics and IV amiodarone with recurrent for A. fib RVR and sepsis        Consultants:  CCM admitted patient.  Cardiology   Procedures:  Cystoscopy, ureteral stent placement.   Antimicrobials:  Ceftriaxone  Zithromax  Subjective: He is alert and conversant, he denies chest pain, denies worsening shortness of breath.  He is feeling better. He told me that his cardiologist is Dr. Tamala Julian.  He was aware that he has a history of A. fib.  Objective: Vitals:   07/13/21 1256 07/13/21 1336 07/13/21 1400 07/13/21 1430  BP: 91/75 104/63 108/63 103/64  Pulse: (!) 58 63 60 61  Resp: 19 13 16 17   Temp: 98.5 F (36.9 C)     TempSrc: Oral     SpO2: 95% 96% 93% 90%  Weight:      Height:        Intake/Output Summary (Last 24 hours) at 07/13/2021 1545 Last data filed at 07/13/2021 1500 Gross per 24 hour  Intake 1520.89 ml  Output 2050 ml  Net -529.11 ml   Filed Weights   07/11/21 2152 07/12/21 0500 07/13/21 0338  Weight: 101.2 kg 100.8 kg 101.2 kg    Examination:  General exam: Appears calm and comfortable  Respiratory system: Clear to auscultation. Respiratory effort normal. Cardiovascular system: S1 & S2 heard, IRR. No JVD, murmurs, rubs, gallops or clicks. No pedal edema. Gastrointestinal system: Abdomen is nondistended, soft and nontender. No organomegaly or masses felt. Normal bowel sounds heard. Central nervous system: Alert and oriented.  Extremities: Symmetric 5 x 5 power.   Data Reviewed: I have personally reviewed following labs and imaging studies  CBC: Recent Labs  Lab 07/11/21 2147 07/12/21 0422  WBC 3.9* 6.7  NEUTROABS 3.1  --   HGB 13.5 12.6*  HCT 38.0* 35.4*  MCV 96.7 95.9  PLT 105* 96*   Basic Metabolic Panel: Recent Labs  Lab 07/11/21 2147 07/12/21 0422 07/13/21 0338  NA 133* 135 138  K 3.2* 3.4* 3.1*  CL 101 104 107  CO2 19* 19* 24  GLUCOSE 118* 127* 142*  BUN 54* 49*  34*  CREATININE 3.55* 2.76* 1.23  CALCIUM 8.5* 8.2* 8.5*  MG  --  1.5* 2.1  PHOS  --  3.7  --    GFR: Estimated Creatinine Clearance: 73.5 mL/min (by C-G formula based on SCr of 1.23 mg/dL). Liver Function Tests: Recent Labs  Lab 07/11/21 2147  AST 19  ALT 15  ALKPHOS 53  BILITOT 1.1  PROT 6.4*  ALBUMIN 3.2*   No results for input(s): LIPASE, AMYLASE in the last 168 hours. No results for input(s): AMMONIA in the last 168 hours. Coagulation Profile: Recent Labs  Lab 07/13/21 0927  INR 1.1   Cardiac Enzymes: No results for input(s): CKTOTAL, CKMB, CKMBINDEX, TROPONINI in the last 168 hours. BNP (last 3 results) No results for input(s): PROBNP in the last 8760 hours. HbA1C: No results for input(s): HGBA1C in the last 72 hours. CBG: Recent Labs  Lab 07/12/21 0254  GLUCAP 127*   Lipid Profile: No results for input(s): CHOL, HDL, LDLCALC, TRIG, CHOLHDL, LDLDIRECT in the last 72 hours. Thyroid Function Tests: Recent Labs    07/13/21 0927  TSH 0.072*   Anemia Panel: No results for input(s): VITAMINB12, FOLATE, FERRITIN, TIBC, IRON, RETICCTPCT in the last 72 hours. Sepsis  Labs: Recent Labs  Lab 07/11/21 2147 07/12/21 0022 07/12/21 0028  PROCALCITON  --  30.36  --   LATICACIDVEN 1.6  --  1.3    Recent Results (from the past 240 hour(s))  Resp Panel by RT-PCR (Flu A&B, Covid) Nasopharyngeal Swab     Status: None   Collection Time: 07/11/21  8:03 PM   Specimen: Nasopharyngeal Swab; Nasopharyngeal(NP) swabs in vial transport medium  Result Value Ref Range Status   SARS Coronavirus 2 by RT PCR NEGATIVE NEGATIVE Final    Comment: (NOTE) SARS-CoV-2 target nucleic acids are NOT DETECTED.  The SARS-CoV-2 RNA is generally detectable in upper respiratory specimens during the acute phase of infection. The lowest concentration of SARS-CoV-2 viral copies this assay can detect is 138 copies/mL. A negative result does not preclude SARS-Cov-2 infection and should not be  used as the sole basis for treatment or other patient management decisions. A negative result may occur with  improper specimen collection/handling, submission of specimen other than nasopharyngeal swab, presence of viral mutation(s) within the areas targeted by this assay, and inadequate number of viral copies(<138 copies/mL). A negative result must be combined with clinical observations, patient history, and epidemiological information. The expected result is Negative.  Fact Sheet for Patients:  EntrepreneurPulse.com.au  Fact Sheet for Healthcare Providers:  IncredibleEmployment.be  This test is no t yet approved or cleared by the [place] FDA and  has been authorized for detection and/or diagnosis of SARS-CoV-2 by FDA under an Emergency Use Authorization (EUA). This EUA will remain  in effect (meaning this test can be used) for the duration of the COVID-19 declaration under Section 564(b)(1) of the Act, 21 U.S.C.section 360bbb-3(b)(1), unless the authorization is terminated  or revoked sooner.       Influenza A by PCR NEGATIVE NEGATIVE Final   Influenza B by PCR NEGATIVE NEGATIVE Final    Comment: (NOTE) The Xpert Xpress SARS-CoV-2/FLU/RSV plus assay is intended as an aid in the diagnosis of influenza from Nasopharyngeal swab specimens and should not be used as a sole basis for treatment. Nasal washings and aspirates are unacceptable for Xpert Xpress SARS-CoV-2/FLU/RSV testing.  Fact Sheet for Patients: EntrepreneurPulse.com.au  Fact Sheet for Healthcare Providers: IncredibleEmployment.be  This test is not yet approved or cleared by the [place] FDA and has been authorized for detection and/or diagnosis of SARS-CoV-2 by FDA under an Emergency Use Authorization (EUA). This EUA will remain in effect (meaning this test can be used) for the duration of the COVID-19 declaration under Section  564(b)(1) of the Act, 21 U.S.C. section 360bbb-3(b)(1), unless the authorization is terminated or revoked.  Performed at [facility], [address] [place], [place] [address]   Blood culture (routine x 2)     Status: None (Preliminary result)   Collection Time: 07/11/21  9:47 PM   Specimen: BLOOD  Result Value Ref Range Status   Specimen Description BLOOD LEFT HAND  Final   Special Requests   Final    BOTTLES DRAWN AEROBIC AND ANAEROBIC Blood Culture adequate volume   Culture  Setup Time   Final    Organism ID to follow GRAM POSITIVE COCCI ANAEROBIC BOTTLE ONLY CRITICAL RESULT CALLED TO, READ BACK BY AND VERIFIED WITH: BRANDON BEERS AT [id number] 07/13/21.PMF Performed at [facility], [address] [place], [place] [address]    Culture [facility] POSITIVE COCCI  Final   Report Status PENDING  Incomplete  Blood culture (routine x 2)     Status: None (  Preliminary result)   Collection Time: 07/11/21  9:47 PM   Specimen: BLOOD  Result Value Ref Range Status   Specimen Description BLOOD RIGHT ASSIST CONTROL  Final   Special Requests   Final    BOTTLES DRAWN AEROBIC AND ANAEROBIC Blood Culture results may not be optimal due to an excessive volume of blood received in culture bottles   Culture   Final    NO GROWTH 2 DAYS Performed at [facility], [address], [place], [place] [address]    Report Status PENDING  Incomplete  Blood Culture ID Panel (Reflexed)     Status: Abnormal   Collection Time: 07/11/21  9:47 PM  Result Value Ref Range Status   Enterococcus faecalis NOT DETECTED NOT DETECTED Final   Enterococcus Faecium NOT DETECTED NOT DETECTED Final   Listeria monocytogenes NOT DETECTED NOT DETECTED Final   Staphylococcus species DETECTED (A) NOT DETECTED Final    Comment: CRITICAL RESULT CALLED TO, READ BACK BY AND VERIFIED WITH: BRANDON BEERS AT 1332 07/13/21.PMF    Staphylococcus aureus (BCID) NOT DETECTED NOT DETECTED Final   Staphylococcus  epidermidis DETECTED (A) NOT DETECTED Final    Comment: CRITICAL RESULT CALLED TO, READ BACK BY AND VERIFIED WITH: BRANDON BEERS AT 1332 07/13/21.PMF    Staphylococcus lugdunensis NOT DETECTED NOT DETECTED Final   Streptococcus species NOT DETECTED NOT DETECTED Final   Streptococcus agalactiae NOT DETECTED NOT DETECTED Final   Streptococcus pneumoniae NOT DETECTED NOT DETECTED Final   Streptococcus pyogenes NOT DETECTED NOT DETECTED Final   A.calcoaceticus-baumannii NOT DETECTED NOT DETECTED Final   Bacteroides fragilis NOT DETECTED NOT DETECTED Final   Enterobacterales NOT DETECTED NOT DETECTED Final   Enterobacter cloacae complex NOT DETECTED NOT DETECTED Final   Escherichia coli NOT DETECTED NOT DETECTED Final   Klebsiella aerogenes NOT DETECTED NOT DETECTED Final   Klebsiella oxytoca NOT DETECTED NOT DETECTED Final   Klebsiella pneumoniae NOT DETECTED NOT DETECTED Final   Proteus species NOT DETECTED NOT DETECTED Final   Salmonella species NOT DETECTED NOT DETECTED Final   Serratia marcescens NOT DETECTED NOT DETECTED Final   Haemophilus influenzae NOT DETECTED NOT DETECTED Final   Neisseria meningitidis NOT DETECTED NOT DETECTED Final   Pseudomonas aeruginosa NOT DETECTED NOT DETECTED Final   Stenotrophomonas maltophilia NOT DETECTED NOT DETECTED Final   Candida albicans NOT DETECTED NOT DETECTED Final   Candida auris NOT DETECTED NOT DETECTED Final   Candida glabrata NOT DETECTED NOT DETECTED Final   Candida krusei NOT DETECTED NOT DETECTED Final   Candida parapsilosis NOT DETECTED NOT DETECTED Final   Candida tropicalis NOT DETECTED NOT DETECTED Final   Cryptococcus neoformans/gattii NOT DETECTED NOT DETECTED Final   Methicillin resistance mecA/C NOT DETECTED NOT DETECTED Final    Comment: Performed at [facility], [address], [place], [place] [address]  Urine Culture     Status: None   Collection Time: 07/11/21 11:08 PM   Specimen: Urine, Random  Result Value  Ref Range Status   Specimen Description   Final    URINE, RANDOM Performed at [facility], [address] [place], [place] [address]    Special Requests   Final    NONE Performed at [facility], [address] [place], [place] [address]    Culture   Final    NO GROWTH Performed at [facility], [address] [place], [place] [address]    Report Status 07/12/2021 FINAL  Final  MRSA Next Gen  by PCR, Nasal     Status: None   Collection Time: 07/12/21  2:56 AM   Specimen: Nasal Mucosa; Nasal Swab  Result Value Ref Range Status   MRSA by PCR Next Gen NOT DETECTED NOT DETECTED Final    Comment: (NOTE) The GeneXpert MRSA Assay (FDA approved for NASAL specimens only), is one component of a comprehensive MRSA colonization surveillance program. It is not intended to diagnose MRSA infection nor to guide or monitor treatment for MRSA infections. Test performance is not FDA approved in patients less than 3 years old. Performed at [facility], [address], [place], [place] [address]          Radiology Studies: DG Chest [place] 1 View  Result Date: 07/12/2021 CLINICAL DATA:  Central line placement EXAM: PORTABLE CHEST 1 VIEW COMPARISON:  Yesterday FINDINGS: New right IJ line with tip at the SVC. No pneumothorax or new mediastinal widening. Interstitial coarsening which is similar to prior. COPD by CT. Normal heart size. No effusion or consolidation. IMPRESSION: New central line without complicating feature. Stable aeration since yesterday. Electronically Signed   By: Jorje Guild M.D.   On: 07/12/2021 05:37   DG Chest Portable 1 View  Result Date: 07/11/2021 CLINICAL DATA:  Shortness of breath EXAM: PORTABLE CHEST 1 VIEW COMPARISON:  03/11/2015 FINDINGS: Central airways thickening with possible developing patchy infiltrate at left base. Normal cardiac size. No pneumothorax. Aortic atherosclerosis IMPRESSION: Central airways thickening  suggesting bronchitis, with possible developing infiltrate at left base. Electronically Signed   By: Donavan Foil M.D.   On: 07/11/2021 22:11   DG OR UROLOGY CYSTO IMAGE ([facility] ONLY)  Result Date: 07/12/2021 There is no interpretation for this exam.  This order is for images obtained during a surgical procedure.  Please See "Surgeries" Tab for more information regarding the procedure.   CT CHEST ABDOMEN PELVIS WO CONTRAST  Addendum Date: 07/12/2021   ADDENDUM REPORT: 07/12/2021 04:21 ADDENDUM: This addendum is requested by urology. The 10 mm proximal right ureteral stone described above is obstructing, causing severe right hydronephrosis with perinephric stranding. Electronically Signed   By: Telford Nab M.D.   On: 07/12/2021 04:21   Result Date: 07/12/2021 CLINICAL DATA:  Pneumonia, complication suspected, xray done. Fever. Altered mental status. Shortness of breath. EXAM: CT CHEST, ABDOMEN AND PELVIS WITHOUT CONTRAST TECHNIQUE: Multidetector CT imaging of the chest, abdomen and pelvis was performed following the standard protocol without IV contrast. RADIATION DOSE REDUCTION: This exam was performed according to the departmental dose-optimization program which includes automated exposure control, adjustment of the mA and/or kV according to patient size and/or use of iterative reconstruction technique. COMPARISON:  Chest x-ray today.  CT 06/26/2021 FINDINGS: CT CHEST FINDINGS Cardiovascular: Heart is normal size. Aorta is normal caliber. Moderate coronary artery and scattered aortic calcifications. Mediastinum/Nodes: No mediastinal, hilar, or axillary adenopathy. Trachea and esophagus are unremarkable. Thyroid unremarkable. Lungs/Pleura: Paraseptal and centrilobular emphysema. Predominantly linear opacities at the lung bases, favor atelectasis. No effusions. Musculoskeletal: Chest wall soft tissues are unremarkable. No acute bony abnormality. CT ABDOMEN PELVIS FINDINGS Hepatobiliary: No focal hepatic  abnormality. Gallbladder unremarkable. Pancreas: No focal abnormality or ductal dilatation. Spleen: No focal abnormality.  Normal size. Adrenals/Urinary Tract: Severe right hydronephrosis and perinephric stranding due to 10 mm proximal right ureteral stone. No stones or hydronephrosis on the left. Adrenal glands and urinary bladder unremarkable. Stomach/Bowel: Sigmoid diverticulosis. No active diverticulitis. Stomach and small bowel decompressed, unremarkable. Vascular/Lymphatic: Aortic atherosclerosis. No evidence of aneurysm or adenopathy. Reproductive: No  visible focal abnormality. Other: No free fluid or free air. Small bilateral inguinal hernias containing fat. Musculoskeletal: No acute bony abnormality. IMPRESSION: Coronary artery disease, aortic atherosclerosis. Bibasilar opacities, favor atelectasis. 10 mm proximal right ureteral stone with severe right hydronephrosis and perinephric stranding. Aortic atherosclerosis. Sigmoid diverticulosis. Electronically Signed: By: Rolm Baptise M.D. On: 07/12/2021 00:56        Scheduled Meds:  budesonide (PULMICORT) nebulizer solution  0.25 mg Nebulization BID   Chlorhexidine Gluconate Cloth  6 each Topical Q0600   hydrocortisone sod succinate (SOLU-CORTEF) inj  100 mg Intravenous Q8H   ipratropium-albuterol  3 mL Nebulization Q6H   midodrine  10 mg Oral TID WC   Continuous Infusions:  amiodarone 30 mg/hr (07/13/21 1347)   azithromycin Stopped (07/12/21 2345)   cefTRIAXone (ROCEPHIN)  IV Stopped (07/13/21 1014)   heparin 1,300 Units/hr (07/13/21 1500)   norepinephrine (LEVOPHED) Adult infusion Stopped (07/12/21 1904)     LOS: 2 days    Time spent: 35 minutes.,     Elmarie Shiley, MD [facility]   If 7PM-7AM, please contact night-coverage [email or web address]  07/13/2021, 3:45 PM

## 2021-07-13 NOTE — Progress Notes (Signed)
°  Amiodarone Drug - Drug Interaction Consult Note  Amiodarone is metabolized by the cytochrome P450 system and therefore has the potential to cause many drug interactions. Amiodarone has an average plasma half-life of 50 days (range 20 to 100 days).   There is potential for drug interactions to occur several weeks or months after stopping treatment and the onset of drug interactions may be slow after initiating amiodarone.   [x]  Statins: Increased risk of myopathy. Simvastatin- restrict dose  to 20mg  daily. Other statins: counsel patients to report any muscle pain or weakness immediately. On atorvastatin 40 mg daily PTA.  []  Anticoagulants: Amiodarone can increase anticoagulant effect. Consider warfarin dose reduction. Patients should be monitored closely and the dose of anticoagulant altered accordingly, remembering that amiodarone levels take several weeks to stabilize.  []  Antiepileptics: Amiodarone can increase plasma concentration of phenytoin, the dose should be reduced. Note that small changes in phenytoin dose can result in large changes in levels. Monitor patient and counsel on signs of toxicity.  [x]  Beta blockers: increased risk of bradycardia, AV block and myocardial depression. Sotalol - avoid concomitant use.  On carvedilol 6.25 mg BID PTA.  []   Calcium channel blockers (diltiazem and verapamil): increased risk of bradycardia, AV block and myocardial depression.  []   Cyclosporine: Amiodarone increases levels of cyclosporine. Reduced dose of cyclosporine is recommended.  []  Digoxin dose should be halved when amiodarone is started.  []  Diuretics: increased risk of cardiotoxicity if hypokalemia occurs.  []  Oral hypoglycemic agents (glyburide, glipizide, glimepiride): increased risk of hypoglycemia. Patient's glucose levels should be monitored closely when initiating amiodarone therapy.   []  Drugs that prolong the QT interval:  Torsades de pointes risk may be increased with  concurrent use - avoid if possible.  Monitor QTc, also keep magnesium/potassium WNL if concurrent therapy can't be avoided.  Antibiotics: e.g. fluoroquinolones, erythromycin.  Antiarrhythmics: e.g. quinidine, procainamide, disopyramide, sotalol.  Antipsychotics: e.g. phenothiazines, haloperidol.   Lithium, tricyclic antidepressants, and methadone.  Thank You,   , PharmD, BCPS Clinical Pharmacist 07/13/2021 1:57 PM

## 2021-07-13 NOTE — Progress Notes (Signed)
[place] for heparin Indication: atrial fibrillation  Allergies  Allergen Reactions   Nucynta [Tapentadol] Nausea Only and Other (See Comments)    Dizziness, woozy.  Sick to his stomach   Codeine Other (See Comments)    Too drowsy    Patient Measurements: Height: 5\' 11"  (180.3 cm) Weight: 101.2 kg (223 lb 1.7 oz) IBW/kg (Calculated) : 75.3 Heparin Dosing Weight: 96 kg  Vital Signs: Temp: 98.6 F (37 C) (01/13 0730) Temp Source: Oral (01/13 0730) BP: 82/65 (01/13 1130) Pulse Rate: 64 (01/13 1130)  Labs: Recent Labs    07/11/21 2147 07/12/21 0022 07/12/21 0422 07/13/21 0338 07/13/21 0927  HGB 13.5  --  12.6*  --   --   HCT 38.0*  --  35.4*  --   --   PLT 105*  --  96*  --   --   APTT  --   --   --   --  31  LABPROT  --   --   --   --  13.8  INR  --   --   --   --  1.1  CREATININE 3.55*  --  2.76* 1.23  --   TROPONINIHS 57* 50*  --   --   --     Estimated Creatinine Clearance: 73.5 mL/min (by C-G formula based on SCr of 1.23 mg/dL).   Medical History: Past Medical History:  Diagnosis Date   Bronchitis    CHRONIC   Chronic HFrEF (heart failure with reduced ejection fraction) ([place])    a. 03/2021 Echo: EF 30-35%, GrI DD.   Coronary artery disease    a. 05/2012 Ant STEMI/PCI: LAD 100p (thrombectomy and 3.5x20 Veriflex BMS); b. 12/2014 Cath: patent stent; c. 03/2015 Cath: LM nl, LAD 10 ISR, 79m, D1 50p, LCX 20 diff throughout, RCA nl-->Med rx.   Erectile dysfunction    History of kidney stones    HOH (hard of hearing)    Hypercholesterolemia    Hypertension    Ischemic cardiomyopathy    a. 11/2015 Echo: EF 30-35%, diff HK; b. Refused ICD; c. 03/2021 Echo: EF 30-35%, GrI DD, nl RV fxn, mild MR.   MI (myocardial infarction) ([place]) 06/19/2012   being considered for defibilator but patient now refuses this.   Nephrolithiasis    NSTEMI (non-ST elevated myocardial infarction) ([place]) 03/2015   Patent BMS - LAD stent.   Sleep apnea     Tobacco abuse    Varicose veins      Assessment: 66 year old male with known right kidney stone originally planned for lithotripsy as outpatient. Patient presented with AMS; concern for septic shock in the setting of suspected pyelonephritis. Patient underwent cystoscopy with stent placement 1/12. AKI in the setting of above, now improved. New onset atrial fibrillation, started on an amiodarone infusion. Pharmacy consult for heparin management.  Goal of Therapy:  Heparin level 0.3-0.7 units/ml Monitor platelets by anticoagulation protocol: Yes   Plan:  --Heparin 5700 unit bolus --Heparin infusion at 1300 units/hr --Check HL at 1600 --CBC daily while on heparin  Tawnya Crook, PharmD, BCPS Clinical Pharmacist 07/13/2021 12:29 PM

## 2021-07-13 NOTE — Progress Notes (Signed)
66 year old male with known right kidney stone originally planned for lithotripsy as outpatient. Patient presented with AMS; concern for septic shock in the setting of suspected pyelonephritis. Patient underwent cystoscopy with stent placement 1/12. AKI in the setting of above. Patient is in the ICU weaned off vasopressors. Pharmacy consulted for electrolyte management.  New onset afib LR bolus 500 and start AMIO Recommend Cardiology Consult

## 2021-07-13 NOTE — Anesthesia Postprocedure Evaluation (Signed)
Anesthesia Post Note  Patient: [name] Walker  Procedure(s) Performed: CYSTOSCOPY WITH STENT PLACEMENT (Right: Ureter)  Patient location during evaluation: ICU Anesthesia Type: General Level of consciousness: awake and awake and alert Pain management: pain level controlled Vital Signs Assessment: post-procedure vital signs reviewed and stable Respiratory status: spontaneous breathing and nonlabored ventilation Cardiovascular status: stable Postop Assessment: no headache and no backache Anesthetic complications: no   No notable events documented.   Last Vitals:  Vitals:   07/13/21 0730 07/13/21 0904  BP: (!) 107/94 93/73  Pulse: 75 (!) 146  Resp: 19 17  Temp: 37 C   SpO2: 91% 95%    Last Pain:  Vitals:   07/13/21 0730  TempSrc: Oral  PainSc: 0-No pain                 Chiropodist

## 2021-07-13 NOTE — Consult Note (Signed)
Cardiology Consult    Patient ID: [name] Walker MRN: [name], DOB/AGE: 65/29/1957   Admit date: 07/11/2021 Date of Consult: 07/13/2021  Primary Physician: Tracie Harrier, MD Primary Cardiologist: Sinclair Grooms, MD Requesting Provider: Corbin Ade, MD  Patient Profile    [name] Walker is a 66 y.o. male with a history of CAD s/p anterior MI and LAD stenting in 2013, HFrEF, ICM (EF 30-35% - prev refused ICD), HTN, HL, tob abuse, OSA, and nephrolithiasis, who is being seen today for the evaluation of PAF/Flutter at the request of Dr. Mortimer Fries.  Past Medical History   Past Medical History:  Diagnosis Date   Bronchitis    CHRONIC   Chronic HFrEF (heart failure with reduced ejection fraction) ([place])    a. 03/2021 Echo: EF 30-35%, GrI DD.   Coronary artery disease    a. 05/2012 Ant STEMI/PCI: LAD 100p (thrombectomy and 3.5x20 Veriflex BMS); b. 12/2014 Cath: patent stent; c. 03/2015 Cath: LM nl, LAD 10 ISR, 67m, D1 50p, LCX 20 diff throughout, RCA nl-->Med rx.   Erectile dysfunction    History of kidney stones    HOH (hard of hearing)    Hypercholesterolemia    Hypertension    Ischemic cardiomyopathy    a. 11/2015 Echo: EF 30-35%, diff HK; b. Refused ICD; c. 03/2021 Echo: EF 30-35%, GrI DD, nl RV fxn, mild MR.   MI (myocardial infarction) ([place]) 06/19/2012   being considered for defibilator but patient now refuses this.   Nephrolithiasis    NSTEMI (non-ST elevated myocardial infarction) ([place]) 03/2015   Patent BMS - LAD stent.   Sleep apnea    Tobacco abuse    Varicose veins     Past Surgical History:  Procedure Laterality Date   CARDIAC CATHETERIZATION N/A 01/09/2015   Procedure: Left Heart Cath and Coronary Angiography;  Surgeon: Belva Crome, MD;  Location: [place] [facility];  Service: Cardiovascular;  Laterality: N/A;   CARDIAC CATHETERIZATION N/A 03/13/2015   Procedure: Left Heart Cath and Coronary Angiography;  Surgeon: Belva Crome, MD;  Location: [place] [facility];   Service: Cardiovascular;  Laterality: N/A;   CORONARY ANGIOPLASTY     STENT   CORONARY STENT PLACEMENT     CYSTOSCOPY W/ URETERAL STENT REMOVAL Right 05/13/2017   Procedure: CYSTOSCOPY WITH STENT REMOVAL;  Surgeon: Royston Cowper, MD;  Location: [facility];  Service: Urology;  Laterality: Right;   CYSTOSCOPY WITH STENT PLACEMENT Right 03/07/2015   Procedure: CYSTOSCOPY WITH STENT PLACEMENT;  Surgeon: Royston Cowper, MD;  Location: [facility];  Service: Urology;  Laterality: Right;   CYSTOSCOPY WITH STENT PLACEMENT Right 03/25/2017   Procedure: CYSTOSCOPY WITH STENT PLACEMENT;  Surgeon: Royston Cowper, MD;  Location: [facility];  Service: Urology;  Laterality: Right;   CYSTOSCOPY WITH STENT PLACEMENT Right 07/12/2021   Procedure: CYSTOSCOPY WITH STENT PLACEMENT;  Surgeon: Royston Cowper, MD;  Location: [facility];  Service: Urology;  Laterality: Right;   CYSTOSCOPY/RETROGRADE/URETEROSCOPY Right 02/24/2018   Procedure: CYSTOSCOPY/RETROGRADE/URETEROSCOPY;  Surgeon: Royston Cowper, MD;  Location: [facility];  Service: Urology;  Laterality: Right;   CYSTOSCOPY/RETROGRADE/URETEROSCOPY/STONE EXTRACTION WITH BASKET Right 03/25/2017   Procedure: CYSTOSCOPY/RETROGRADE/URETEROSCOPY/STONE EXTRACTION WITH BASKET;  Surgeon: Royston Cowper, MD;  Location: [facility];  Service: Urology;  Laterality: Right;   EXTRACORPOREAL SHOCK WAVE LITHOTRIPSY Right 03/09/2015   Procedure: EXTRACORPOREAL SHOCK WAVE LITHOTRIPSY (ESWL);  Surgeon: Royston Cowper, MD;  Location: [facility];  Service: Urology;  Laterality: Right;   EXTRACORPOREAL  SHOCK WAVE LITHOTRIPSY Right 05/11/2015   Procedure: EXTRACORPOREAL SHOCK WAVE LITHOTRIPSY (ESWL);  Surgeon: Royston Cowper, MD;  Location: [facility];  Service: Urology;  Laterality: Right;   EXTRACORPOREAL SHOCK WAVE LITHOTRIPSY Right 04/10/2017   Procedure: EXTRACORPOREAL SHOCK WAVE LITHOTRIPSY (ESWL);  Surgeon: Royston Cowper, MD;  Location: [facility];  Service: Urology;  Laterality: Right;    LEFT HEART CATHETERIZATION WITH CORONARY ANGIOGRAM Right 06/19/2012   Procedure: LEFT HEART CATHETERIZATION WITH CORONARY ANGIOGRAM;  Surgeon: Sinclair Grooms, MD;  Location: [facility];  Service: Cardiovascular;  Laterality: Right;   PERCUTANEOUS CORONARY STENT INTERVENTION (PCI-S) Right 06/19/2012   Procedure: PERCUTANEOUS CORONARY STENT INTERVENTION (PCI-S);  Surgeon: Sinclair Grooms, MD;  Location[facility];  Service: Cardiovascular;  Laterality: Right;   URETEROSCOPY WITH HOLMIUM LASER LITHOTRIPSY Right 02/24/2018   Procedure: URETEROSCOPY;  Surgeon: Royston Cowper, MD;  Location: [facility];  Service: Urology;  Laterality: Right;     Allergies  Allergies  Allergen Reactions   Nucynta [Tapentadol] Nausea Only and Other (See Comments)    Dizziness, woozy.  Sick to his stomach   Codeine Other (See Comments)    Too drowsy    History of Present Illness    66 y/o ? with the above complex PMH including CAD, ICM/HFrEF, HTN, HL, tob abuse, OSA, and nephrolithiasis.  He suffered an anterior STEMI complicated by cardiogenic shock requiring IABP in 05/2012, and was found to have an occluded proximal LAD, which was successfully treated w/ a bare metal stent.  EF was noted to be depressed @ 20% and despite GDMT, he has had persistent LV dysfxn w/ an EF of 30-35% by echo in 03/2021.  He previously refused ICD implantation and over the years has been well compensated from a CHF standpoint.  He did require repeat caths in 12/2014 and again in 03/2015 in the setting of NSTEMI (03/2015), and on both occasions, he had minimal LAD in-stent restenosis w/ otw mild to moderate, nonobstructive LAD, diag, and LCX dzs.  He has been medically managed since.  [name] has recently been dealing with right sided flank/back pain in the setting of nephrolithiasis w/ an 11 mm stone noted on CT in late December.  He was seen by urology w/ plan for outpt lithotripsy on 11/12.  Unfortunately, [name] presented to the  ED on 1/11, due to AMS and fever.  He was hypotensive @ 67/36 w/ AKI (54/3.55), acidosis (lactate 2.1), and HsTrop was elevated @ 57.  CT of the abd/pelvis showed a 10 mm proximal right ureteral stone with severe right hydronephrosis and perinephric stranding.  He was started on IVF, norepi, and abx for mgmt of septic shock and acute pyeloneprhitis.  He was admitted to ICU and seen by urology.  He underwent R ureteral stent placement on 1/12.  He was able to be weaned off of norepi postoperatively and early this AM he was noted to have frequent paroxysms of afib into the 130's.  He was asymptomatic.  He later developed sustained atrial flutter in the 150's and was loaded w/ IV amiodarone.  He initially converted back to sinus but then had return of aflutter and was reloaded w/ amio.  His BP has remained soft, though he was otw asymptomatic when in fib/flutter.  Inpatient Medications     budesonide (PULMICORT) nebulizer solution  0.25 mg Nebulization BID   Chlorhexidine Gluconate Cloth  6 each Topical Q0600   enoxaparin (LOVENOX) injection  40 mg Subcutaneous  Daily   hydrocortisone sod succinate (SOLU-CORTEF) inj  100 mg Intravenous Q8H   ipratropium-albuterol  3 mL Nebulization Q6H   midodrine  10 mg Oral TID WC   potassium chloride  40 mEq Oral Once    Family History    Family History  Problem Relation Age of Onset   Cancer Father    Heart murmur Mother    Heart attack Brother    He indicated that his mother is alive. He indicated that his father is deceased. He indicated that his sister is alive. He indicated that only one of his two brothers is alive. He indicated that his maternal grandmother is deceased. He indicated that his maternal grandfather is deceased. He indicated that his paternal grandmother is deceased. He indicated that his paternal grandfather is deceased.   Social History    Social History   Socioeconomic History   Marital status: Single    Spouse name: Not on file    Number of children: Not on file   Years of education: Not on file   Highest education level: Not on file  Occupational History   Not on file  Tobacco Use   Smoking status: Every Day    Packs/day: 0.50    Years: 40.00    Pack years: 20.00    Types: Cigarettes   Smokeless tobacco: Never  Vaping Use   Vaping Use: Never used  Substance and Sexual Activity   Alcohol use: Yes    Alcohol/week: 0.0 standard drinks    Comment: occ   Drug use: No   Sexual activity: Not on file  Other Topics Concern   Not on file  Social History Narrative   Not on file   Social Determinants of Health   Financial Resource Strain: Not on file  Food Insecurity: Not on file  Transportation Needs: Not on file  Physical Activity: Not on file  Stress: Not on file  Social Connections: Not on file  Intimate Partner Violence: Not on file     Review of Systems    General:  No chills, fever, night sweats or weight changes.  Cardiovascular:  No chest pain, dyspnea on exertion, edema, orthopnea, palpitations, paroxysmal nocturnal dyspnea. Dermatological: No rash, lesions/masses Respiratory: No cough, dyspnea Urologic: No hematuria, dysuria Abdominal:   R flank/back pain on the day of admission.  No nausea, vomiting, diarrhea, bright red blood per rectum, melena, or hematemesis Neurologic:  No visual changes, wkns, +++ changes in mental status on admission. All other systems reviewed and are otherwise negative except as noted above.  Physical Exam    Blood pressure (!) 107/94, pulse 75, temperature 98.6 F (37 C), temperature source Oral, resp. rate 19, height 5\' 11"  (1.803 m), weight 101.2 kg, SpO2 91 %.  General: Pleasant, NAD Psych: Normal affect. Neuro: Alert and oriented X 3. Moves all extremities spontaneously. HEENT: Normal  Neck: Supple without bruits or JVD. Lungs:  Resp regular and unlabored, CTA. Heart: RRR, tachycardic @ the time of my exam, no s3, s4, or murmurs. Abdomen: Soft,  non-tender, non-distended, BS + x 4.  Extremities: No clubbing, cyanosis or edema. DP/PT2+, Radials 2+ and equal bilaterally.  Labs    Cardiac Enzymes Recent Labs  Lab 07/11/21 2147 07/12/21 0022  TROPONINIHS 57* 50*      Lab Results  Component Value Date   WBC 6.7 07/12/2021   HGB 12.6 (L) 07/12/2021   HCT 35.4 (L) 07/12/2021   MCV 95.9 07/12/2021   PLT 96 (L) 07/12/2021  Recent Labs  Lab 07/11/21 2147 07/12/21 0422 07/13/21 0338  NA 133*   < > 138  K 3.2*   < > 3.1*  CL 101   < > 107  CO2 19*   < > 24  BUN 54*   < > 34*  CREATININE 3.55*   < > 1.23  CALCIUM 8.5*   < > 8.5*  PROT 6.4*  --   --   BILITOT 1.1  --   --   ALKPHOS 53  --   --   ALT 15  --   --   AST 19  --   --   GLUCOSE 118*   < > 142*   < > = values in this interval not displayed.   Lab Results  Component Value Date   CHOL 155 04/23/2021   HDL 40 04/23/2021   LDLCALC 89 04/23/2021   TRIG 147 04/23/2021    Radiology Studies    CT ABDOMEN PELVIS W WO CONTRAST  Result Date: 06/26/2021 CLINICAL DATA:  Microscopic hematuria EXAM: CT ABDOMEN AND PELVIS WITHOUT AND WITH CONTRAST TECHNIQUE: Multidetector CT imaging of the abdomen and pelvis was performed following the standard protocol before and following the bolus administration of intravenous contrast. CONTRAST:  127mL OMNIPAQUE IOHEXOL 350 MG/ML SOLN COMPARISON:  MRI February 02, 2015 and CT January 06, 2015 FINDINGS: Lower chest: No acute abnormality. Hepatobiliary: No suspicious hepatic lesion. Gallbladder is unremarkable. No biliary ductal dilation. Pancreas: No pancreatic ductal dilation or evidence of acute inflammation. Spleen: Normal in size without focal abnormality. Adrenals/Urinary Tract: Bilateral adrenal glands are unremarkable. No hydronephrosis. Nonobstructive 11 mm right lower pole renal stone. No obstructive ureteral or bladder calculi. Right renal cysts measuring up to 2 cm. No solid enhancing renal mass. The kidneys demonstrate symmetric  enhancement and excretion of contrast material. No collecting system blue duplication. No suspicious filling defect visualized within the opacified portions of the collecting systems or ureters on delayed imaging. Mild wall thickening of an incompletely distended urinary bladder. Stomach/Bowel: No enteric contrast was administered. Stomach is distended with ingested material without wall thickening. No pathologic dilation of small or large bowel. The terminal ileum appears normal. The appendix is not confidently identified however there is no pericecal inflammation. Descending and sigmoid colonic diverticulosis without findings of acute diverticulitis. Vascular/Lymphatic: Aortic and branch vessel atherosclerosis without abdominal aortic aneurysm. No pathologically enlarged abdominal or pelvic lymph nodes. Reproductive: Prostate is unremarkable. Other: Bilateral fat containing inguinal hernias. No significant abdominopelvic free fluid. Musculoskeletal: Thoracolumbar spondylosis. No acute osseous abnormality. IMPRESSION: 1. Nonobstructive 11 mm right lower pole renal stone. No obstructive ureteral or bladder calculi. 2. No solid enhancing renal mass. 3. Descending and sigmoid colonic diverticulosis without findings of acute diverticulitis. 4. Bilateral fat containing inguinal hernias. 5.  Aortic Atherosclerosis (ICD10-I70.0). Electronically Signed   By: Dahlia Bailiff M.D.   On: 06/26/2021 22:42   DG Chest Port 1 View  Result Date: 07/12/2021 CLINICAL DATA:  Central line placement EXAM: PORTABLE CHEST 1 VIEW COMPARISON:  Yesterday FINDINGS: New right IJ line with tip at the SVC. No pneumothorax or new mediastinal widening. Interstitial coarsening which is similar to prior. COPD by CT. Normal heart size. No effusion or consolidation. IMPRESSION: New central line without complicating feature. Stable aeration since yesterday. Electronically Signed   By: Jorje Guild M.D.   On: 07/12/2021 05:37   DG Chest  Portable 1 View  Result Date: 07/11/2021 CLINICAL DATA:  Shortness of breath EXAM: PORTABLE CHEST 1  VIEW COMPARISON:  03/11/2015 FINDINGS: Central airways thickening with possible developing patchy infiltrate at left base. Normal cardiac size. No pneumothorax. Aortic atherosclerosis IMPRESSION: Central airways thickening suggesting bronchitis, with possible developing infiltrate at left base. Electronically Signed   By: Donavan Foil M.D.   On: 07/11/2021 22:11   DG OR UROLOGY CYSTO IMAGE ([facility] ONLY)  Result Date: 07/12/2021 There is no interpretation for this exam.  This order is for images obtained during a surgical procedure.  Please See "Surgeries" Tab for more information regarding the procedure.   CT CHEST ABDOMEN PELVIS WO CONTRAST  Addendum Date: 07/12/2021   ADDENDUM REPORT: 07/12/2021 04:21 ADDENDUM: This addendum is requested by urology. The 10 mm proximal right ureteral stone described above is obstructing, causing severe right hydronephrosis with perinephric stranding. Electronically Signed   By: Telford Nab M.D.   On: 07/12/2021 04:21   Result Date: 07/12/2021 CLINICAL DATA:  Pneumonia, complication suspected, xray done. Fever. Altered mental status. Shortness of breath. EXAM: CT CHEST, ABDOMEN AND PELVIS WITHOUT CONTRAST TECHNIQUE: Multidetector CT imaging of the chest, abdomen and pelvis was performed following the standard protocol without IV contrast. RADIATION DOSE REDUCTION: This exam was performed according to the departmental dose-optimization program which includes automated exposure control, adjustment of the mA and/or kV according to patient size and/or use of iterative reconstruction technique. COMPARISON:  Chest x-ray today.  CT 06/26/2021 FINDINGS: CT CHEST FINDINGS Cardiovascular: Heart is normal size. Aorta is normal caliber. Moderate coronary artery and scattered aortic calcifications. Mediastinum/Nodes: No mediastinal, hilar, or axillary adenopathy. Trachea and  esophagus are unremarkable. Thyroid unremarkable. Lungs/Pleura: Paraseptal and centrilobular emphysema. Predominantly linear opacities at the lung bases, favor atelectasis. No effusions. Musculoskeletal: Chest wall soft tissues are unremarkable. No acute bony abnormality. CT ABDOMEN PELVIS FINDINGS Hepatobiliary: No focal hepatic abnormality. Gallbladder unremarkable. Pancreas: No focal abnormality or ductal dilatation. Spleen: No focal abnormality.  Normal size. Adrenals/Urinary Tract: Severe right hydronephrosis and perinephric stranding due to 10 mm proximal right ureteral stone. No stones or hydronephrosis on the left. Adrenal glands and urinary bladder unremarkable. Stomach/Bowel: Sigmoid diverticulosis. No active diverticulitis. Stomach and small bowel decompressed, unremarkable. Vascular/Lymphatic: Aortic atherosclerosis. No evidence of aneurysm or adenopathy. Reproductive: No visible focal abnormality. Other: No free fluid or free air. Small bilateral inguinal hernias containing fat. Musculoskeletal: No acute bony abnormality. IMPRESSION: Coronary artery disease, aortic atherosclerosis. Bibasilar opacities, favor atelectasis. 10 mm proximal right ureteral stone with severe right hydronephrosis and perinephric stranding. Aortic atherosclerosis. Sigmoid diverticulosis. Electronically Signed: By: Rolm Baptise M.D. On: 07/12/2021 00:56    ECG & Cardiac Imaging    07/11/2021 sinus tachycardia, 109, freq PVCs, prior anteroseptal infarct - personally reviewed.  Assessment & Plan    1.  Paroxysmal afib/flutter:  pt presented 1/11 w/ AMS, fever, and hypotension in the setting of acute pyelonephritis and septic shock.  He initially required vasopressors.  He underwent right ureteral stenting on January 12, and early this morning, he developed asymptomatic paroxysmal atrial fibrillation and flutter.  While in flutter, rates trended into the 150s.  He required an amiodarone bolus followed by a second bolus in the  setting of recurrent a flutter and at this time, is maintaining sinus rhythm.  Potassium was low and has been supplemented.  TSH is pending.  CHA2DS2-VASc equals 3.  Once cleared with urology, intravenous heparin was initiated.  Provided that rhythm stable, can likely transition him to oral amiodarone in the next 24 hours.  As he will require another urologic procedure within the  next few weeks, would likely plan to continue amiodarone at least in the short-term.  Provided that rhythm is stable, we will plan to transition to oral anticoagulation prior to discharge.  We understand that he will need to hold oral anticoagulation prior to his next urologic procedure, and that should not be an issue from our perspective.  2.  Pyelonephritis and septic shock: In the setting of right nephrolithiasis, status post stenting.  Antibiotics per primary team.  He previously required vasopressor therapy but this was weaned off yesterday.  In the setting of #1, pressures have been soft.  Continue to follow and once stable, would plan to resume home doses of carvedilol and Entresto given prior history of cardiomyopathy and heart failure.  3.  Coronary artery disease/demand ischemia: In the setting of #2, patient has had mild troponin elevation (57  50).  He has not had any chest pain or dyspnea, even in the setting of rapid A. fib/flutter.  He has been on aspirin as an outpatient however, with initiation of Eliquis, will plan to DC.  Plan to resume home doses of beta-blocker and statin therapy.  4.  Chronic heart failure with reduced ejection fraction/ischemic cardiomyopathy: EF 30 to 35% with grade 1 diastolic dysfunction by echo in October 2022.  Euvolemic on examination.  Home doses of beta-blocker, ARB, and Wilder Glade are currently on hold in the setting of hypotension on admission as well as acidosis.  We will look to resume as he stabilizes.  Continue to watch volume status closely.  5.  Tobacco abuse: Smoking cessation  advised.  6.  Obstructive sleep apnea: Patient uses CPAP at home.  Signed, Murray Hodgkins, NP 07/13/2021, 8:52 AM  For questions or updates, please contact   Please consult [email or web address] for contact info under Cardiology/STEMI.

## 2021-07-14 ENCOUNTER — Other Ambulatory Visit: Payer: Self-pay

## 2021-07-14 DIAGNOSIS — I5020 Unspecified systolic (congestive) heart failure: Secondary | ICD-10-CM

## 2021-07-14 DIAGNOSIS — I4891 Unspecified atrial fibrillation: Secondary | ICD-10-CM

## 2021-07-14 LAB — BASIC METABOLIC PANEL
Anion gap: 7 (ref 5–15)
BUN: 31 mg/dL — ABNORMAL HIGH (ref 8–23)
CO2: 23 mmol/L (ref 22–32)
Calcium: 8.3 mg/dL — ABNORMAL LOW (ref 8.9–10.3)
Chloride: 108 mmol/L (ref 98–111)
Creatinine, Ser: 1.06 mg/dL (ref 0.61–1.24)
GFR, Estimated: >60 mL/min (ref >60)
Glucose, Bld: 155 mg/dL — ABNORMAL HIGH (ref 70–99)
Potassium: 3.3 mmol/L — ABNORMAL LOW (ref 3.5–5.1)
Sodium: 138 mmol/L (ref 135–145)

## 2021-07-14 LAB — HEPARIN LEVEL (UNFRACTIONATED)
Heparin Unfractionated: 0.24 IU/mL — ABNORMAL LOW (ref 0.30–0.70)
Heparin Unfractionated: 0.28 IU/mL — ABNORMAL LOW (ref 0.30–0.70)
Heparin Unfractionated: 0.48 IU/mL (ref 0.30–0.70)

## 2021-07-14 LAB — CBC
HCT: 35.2 % — ABNORMAL LOW (ref 39.0–52.0)
Hemoglobin: 12.5 g/dL — ABNORMAL LOW (ref 13.0–17.0)
MCH: 34.3 pg — ABNORMAL HIGH (ref 26.0–34.0)
MCHC: 35.5 g/dL (ref 30.0–36.0)
MCV: 96.7 fL (ref 80.0–100.0)
Platelets: 107 10*3/uL — ABNORMAL LOW (ref 150–400)
RBC: 3.64 MIL/uL — ABNORMAL LOW (ref 4.22–5.81)
RDW: 13.5 % (ref 11.5–15.5)
WBC: 9.7 10*3/uL (ref 4.0–10.5)
nRBC: 0 % (ref 0.0–0.2)

## 2021-07-14 LAB — MAGNESIUM: Magnesium: 2 mg/dL (ref 1.7–2.4)

## 2021-07-14 MED ORDER — MIDODRINE HCL 5 MG PO TABS
5.0000 mg | ORAL_TABLET | Freq: Three times a day (TID) | ORAL | Status: DC
  Administered 2021-07-14 – 2021-07-15 (×3): 5 mg via ORAL
  Filled 2021-07-14 (×3): qty 1

## 2021-07-14 MED ORDER — HEPARIN BOLUS VIA INFUSION
1500.0000 [IU] | Freq: Once | INTRAVENOUS | Status: AC
  Administered 2021-07-14: 1500 [IU] via INTRAVENOUS
  Filled 2021-07-14: qty 1500

## 2021-07-14 MED ORDER — VANCOMYCIN HCL 2000 MG/400ML IV SOLN
2000.0000 mg | Freq: Once | INTRAVENOUS | Status: AC
Start: 2021-07-14 — End: 2021-07-14
  Administered 2021-07-14: 2000 mg via INTRAVENOUS
  Filled 2021-07-14: qty 400

## 2021-07-14 MED ORDER — ASPIRIN EC 81 MG PO TBEC
81.0000 mg | DELAYED_RELEASE_TABLET | Freq: Every day | ORAL | Status: DC
  Administered 2021-07-14 – 2021-07-17 (×4): 81 mg via ORAL
  Filled 2021-07-14 (×4): qty 1

## 2021-07-14 MED ORDER — POTASSIUM CHLORIDE CRYS ER 20 MEQ PO TBCR
40.0000 meq | EXTENDED_RELEASE_TABLET | Freq: Once | ORAL | Status: AC
  Administered 2021-07-14: 40 meq via ORAL
  Filled 2021-07-14: qty 2

## 2021-07-14 MED ORDER — POTASSIUM CHLORIDE CRYS ER 20 MEQ PO TBCR
40.0000 meq | EXTENDED_RELEASE_TABLET | Freq: Once | ORAL | Status: AC
Start: 2021-07-14 — End: 2021-07-14
  Administered 2021-07-14: 40 meq via ORAL
  Filled 2021-07-14: qty 2

## 2021-07-14 MED ORDER — VANCOMYCIN HCL 750 MG/150ML IV SOLN
750.0000 mg | Freq: Two times a day (BID) | INTRAVENOUS | Status: DC
  Administered 2021-07-15 – 2021-07-16 (×3): 750 mg via INTRAVENOUS
  Filled 2021-07-14 (×4): qty 150

## 2021-07-14 MED ORDER — ATORVASTATIN CALCIUM 20 MG PO TABS
40.0000 mg | ORAL_TABLET | Freq: Every day | ORAL | Status: DC
  Administered 2021-07-14: 40 mg via ORAL
  Filled 2021-07-14: qty 2

## 2021-07-14 MED ORDER — DOXYCYCLINE HYCLATE 100 MG PO TABS
100.0000 mg | ORAL_TABLET | Freq: Two times a day (BID) | ORAL | Status: DC
  Administered 2021-07-14: 100 mg via ORAL
  Filled 2021-07-14: qty 1

## 2021-07-14 MED ORDER — HYDROCORTISONE SOD SUC (PF) 100 MG IJ SOLR
50.0000 mg | Freq: Three times a day (TID) | INTRAMUSCULAR | Status: DC
  Administered 2021-07-14 – 2021-07-15 (×3): 50 mg via INTRAVENOUS
  Filled 2021-07-14 (×3): qty 2

## 2021-07-14 NOTE — Progress Notes (Addendum)
PROGRESS NOTE    [name] Walker  [id number] DOB: [birth year] DOA: 07/11/2021 PCP: Tracie Harrier, MD   Brief Narrative: 66 year old with past medical history significant for CAD, history of anterior MI in 2013 secondary to occluded proximal LAD that was treated with PCI stents, ischemic cardiomyopathy with ejection fraction [id number], chronic systolic heart failure, ongoing tobacco abuse, hypertension, OSA, chronic kidney stones presented with altered mental status.  Per family patient developed fevers and associated right flank pain the day prior to admission.  He started to have nausea vomiting 2 days prior to admission, also hematuria.  Patient was admitted by critical care team for sepsis secondary to obstructive uropathy, UTI, systolic blood pressure in the 60.  AKI with a creatinine of 3.5.  CT chest abdomen and pelvis showed obstructing 10 mm proximal right ureteral stone with severe right hydronephrosis and perinephric stranding.  Patient was a started on IV fluids, broad-spectrum IV antibiotic, Levophed.  He underwent cystoscopy with right stent placement on 1/12 by Dr. Yves Dill.  His care was transferred to [facility] 1[facility].  He develops A fib with RVR with low blood pressure.  Cardiology was consulted, patient was started on IV amiodarone, and heparin drip.   Assessment & Plan:   Principal Problem:   Sepsis with hypotension ([place]) Active Problems:   Tobacco abuse   Chronic systolic heart failure (HCC)   Dyslipidemia   Kidney stone on right side   AKI (acute kidney injury) ([place])   Acute unilateral obstructive uropathy   Atrial fibrillation (HCC)   HFrEF (heart failure with reduced ejection fraction) ([place])   1-Sepsis with Septic shock secondary to acute obstructive pyelonephritis and possible underlying pneumonia: -Patient admitted to the ICU, he received IV fluids, broad-spectrum antibiotics and IV pressors -He underwent cystoscopy with right stent placement on 1/12 by Dr.  Eliberto Ivory. -Continue with IV ceftriaxone.  -Urine culture no growth to date.  -On IV stress dose steroids.  -Started on Midodrine.  -Now Blood culture: 2 anaerobic Blood culture positive, Gram positive cocci, BCID ; stap Epi, final report pending. Discussed with ID, plan to start Vancomycin and stop doxy. Continue ceftriaxone.  I have repeated Blood culture 1/14. Start taper stress dose steroids.   2-AKI secondary to acute obstructing uropathy secondary to right ureteral lithiasis -Patient underwent cystoscopy and right ureteral stent placement 1/12 -Renal function significantly improved, creatinine peak to 2.5, it has decreased to 1.2. -He received IV fluids. -He will need to follow-up with Dr. Yves Dill  for lithotripsy and ureteral stent removal  3-A fib RVR, with Hypotension;  -He was on Coreg at home, unable to do beta-blockers due to hypotension -He was a started on IV amiodarone drip. Now on oral amiodarone.  -He was also started on heparin drip. Continue while in house.  -Cardiology consulted and following.  -Patient will need cardiac monitor at discharge. He will also need oral anticoagulation.   4--Hypokalemia: Replete orally times 2.  5--Hypomagnesemia: Replaced.  Magnesium 2.  6-Acute on chronic hypoxic respiratory failure secondary to possible pneumonia, AECOPD  -Past medical history COPD, current tobacco abuse, OSA: Continue with  oxygen supplementation. Continue with incentive spirometry. Continue with nebulizer, Pulmicort.  On IV antibiotics for PNA> ceftriaxone.   7-Chronic systolic heart failure, last known ejection fraction 35% History of hypertension, CAD anterior MI 2013 s/p PCI to LAD.  Ischemic cardiomyopathy, HLD -Continue to hold Carvedilol and Entresto in the setting of hypotension.  8-Acute metabolic encephalopathy due to sepsis: Improved.   Low TSH. Free T  3 and T 4 ordered.  [id number]  Estimated body mass index is 30.69 kg/m as calculated from the  following:   Height as of this encounter: 5\' 11"  (1.803 m).   Weight as of this encounter: 99.8 kg.   DVT prophylaxis: Heparin gtt Code Status: Full Code Family Communication: care discussed with patient.  Disposition Plan:  Status is: Inpatient  Remains inpatient appropriate because: Required IV antibiotics and IV amiodarone with recurrent for A. fib RVR and sepsis        Consultants:  CCM admitted patient.  Cardiology   Procedures:  Cystoscopy, ureteral stent placement.   Antimicrobials:  Ceftriaxone  Zithromax  Subjective: He is feeling better, denies chest pain.   Objective: Vitals:   07/14/21 1000 07/14/21 1100 07/14/21 1200 07/14/21 1400  BP: 115/74 115/70 110/69 112/70  Pulse: (!) 59 (!) 53 (!) 57 (!) 58  Resp: 12 13 19 16   Temp:      TempSrc:      SpO2: 95% 96% (!) 88% 93%  Weight:      Height:        Intake/Output Summary (Last 24 hours) at 07/14/2021 1437 Last data filed at 07/14/2021 1400 Gross per 24 hour  Intake 1198.85 ml  Output 650 ml  Net 548.85 ml    Filed Weights   07/12/21 0500 07/13/21 0338 07/14/21 0456  Weight: 100.8 kg 101.2 kg 99.8 kg    Examination:  General exam: NAD Respiratory system: CTA Cardiovascular system: S 1, S 2 RRR Gastrointestinal system: BS present, soft, nt Central nervous system: alert, oriented Extremities: no edema   Data Reviewed: I have personally reviewed following labs and imaging studies  CBC: Recent Labs  Lab 07/11/21 2147 07/12/21 0422 07/14/21 0450  WBC 3.9* 6.7 9.7  NEUTROABS 3.1  --   --   HGB 13.5 12.6* 12.5*  HCT 38.0* 35.4* 35.2*  MCV 96.7 95.9 96.7  PLT 105* 96* 107*    Basic Metabolic Panel: Recent Labs  Lab 07/11/21 2147 07/12/21 0422 07/13/21 0338 07/14/21 0450 07/14/21 0539  NA 133* 135 138 138  --   K 3.2* 3.4* 3.1* 3.3*  --   CL 101 104 107 108  --   CO2 19* 19* 24 23  --   GLUCOSE 118* 127* 142* 155*  --   BUN 54* 49* 34* 31*  --   CREATININE 3.55* 2.76*  1.23 1.06  --   CALCIUM 8.5* 8.2* 8.5* 8.3*  --   MG  --  1.5* 2.1  --  2.0  PHOS  --  3.7  --   --   --     GFR: Estimated Creatinine Clearance: 84.7 mL/min (by C-G formula based on SCr of 1.06 mg/dL). Liver Function Tests: Recent Labs  Lab 07/11/21 2147  AST 19  ALT 15  ALKPHOS 53  BILITOT 1.1  PROT 6.4*  ALBUMIN 3.2*    No results for input(s): LIPASE, AMYLASE in the last 168 hours. No results for input(s): AMMONIA in the last 168 hours. Coagulation Profile: Recent Labs  Lab 07/13/21 0927  INR 1.1    Cardiac Enzymes: No results for input(s): CKTOTAL, CKMB, CKMBINDEX, TROPONINI in the last 168 hours. BNP (last 3 results) No results for input(s): PROBNP in the last 8760 hours. HbA1C: No results for input(s): HGBA1C in the last 72 hours. CBG: Recent Labs  Lab 07/12/21 0254  GLUCAP 127*    Lipid Profile: No results for input(s): CHOL, HDL, LDLCALC, TRIG, CHOLHDL, LDLDIRECT  in the last 72 hours. Thyroid Function Tests: Recent Labs    07/13/21 0927  TSH 0.072*    Anemia Panel: No results for input(s): VITAMINB12, FOLATE, FERRITIN, TIBC, IRON, RETICCTPCT in the last 72 hours. Sepsis Labs: Recent Labs  Lab 07/11/21 2147 07/12/21 0022 07/12/21 0028  PROCALCITON  --  30.36  --   LATICACIDVEN 1.6  --  1.3     Recent Results (from the past 240 hour(s))  Resp Panel by RT-PCR (Flu A&B, Covid) Nasopharyngeal Swab     Status: None   Collection Time: 07/11/21  8:03 PM   Specimen: Nasopharyngeal Swab; Nasopharyngeal(NP) swabs in vial transport medium  Result Value Ref Range Status   SARS Coronavirus 2 by RT PCR NEGATIVE NEGATIVE Final    Comment: (NOTE) SARS-CoV-2 target nucleic acids are NOT DETECTED.  The SARS-CoV-2 RNA is generally detectable in upper respiratory specimens during the acute phase of infection. The lowest concentration of SARS-CoV-2 viral copies this assay can detect is 138 copies/mL. A negative result does not preclude  SARS-Cov-2 infection and should not be used as the sole basis for treatment or other patient management decisions. A negative result may occur with  improper specimen collection/handling, submission of specimen other than nasopharyngeal swab, presence of viral mutation(s) within the areas targeted by this assay, and inadequate number of viral copies(<138 copies/mL). A negative result must be combined with clinical observations, patient history, and epidemiological information. The expected result is Negative.  Fact Sheet for Patients:  EntrepreneurPulse.com.au  Fact Sheet for Healthcare Providers:  IncredibleEmployment.be  This test is no t yet approved or cleared by the [place] FDA and  has been authorized for detection and/or diagnosis of SARS-CoV-2 by FDA under an Emergency Use Authorization (EUA). This EUA will remain  in effect (meaning this test can be used) for the duration of the COVID-19 declaration under Section 564(b)(1) of the Act, 21 U.S.C.section 360bbb-3(b)(1), unless the authorization is terminated  or revoked sooner.       Influenza A by PCR NEGATIVE NEGATIVE Final   Influenza B by PCR NEGATIVE NEGATIVE Final    Comment: (NOTE) The Xpert Xpress SARS-CoV-2/FLU/RSV plus assay is intended as an aid in the diagnosis of influenza from Nasopharyngeal swab specimens and should not be used as a sole basis for treatment. Nasal washings and aspirates are unacceptable for Xpert Xpress SARS-CoV-2/FLU/RSV testing.  Fact Sheet for Patients: EntrepreneurPulse.com.au  Fact Sheet for Healthcare Providers: IncredibleEmployment.be  This test is not yet approved or cleared by the [place] FDA and has been authorized for detection and/or diagnosis of SARS-CoV-2 by FDA under an Emergency Use Authorization (EUA). This EUA will remain in effect (meaning this test can be used) for the duration of  the COVID-19 declaration under Section 564(b)(1) of the Act, 21 U.S.C. section 360bbb-3(b)(1), unless the authorization is terminated or revoked.  Performed at [facility], [address] [place], [place] [address]   Blood culture (routine x 2)     Status: None (Preliminary result)   Collection Time: 07/11/21  9:47 PM   Specimen: BLOOD  Result Value Ref Range Status   Specimen Description   Final    BLOOD LEFT HAND Performed at [facility], [address] [place], [place] [address]    Special Requests   Final    BOTTLES DRAWN AEROBIC AND ANAEROBIC Blood Culture adequate volume Performed at [facility], [address] [place], [place] [address]    Culture  Setup Time  Final    GRAM POSITIVE COCCI ANAEROBIC BOTTLE ONLY CRITICAL RESULT CALLED TO, READ BACK BY AND VERIFIED WITH: BRANDON BEERS AT 1332 07/13/21.PMF    Culture   Final    GRAM POSITIVE COCCI TOO YOUNG TO READ Performed at [facility], [address], [place], [place] [address]    Report Status PENDING  Incomplete  Blood culture (routine x 2)     Status: None (Preliminary result)   Collection Time: 07/11/21  9:47 PM   Specimen: BLOOD  Result Value Ref Range Status   Specimen Description BLOOD RIGHT ASSIST CONTROL  Final   Special Requests   Final    BOTTLES DRAWN AEROBIC AND ANAEROBIC Blood Culture results may not be optimal due to an excessive volume of blood received in culture bottles   Culture  Setup Time   Final    GRAM POSITIVE COCCI IN BOTH AEROBIC AND ANAEROBIC BOTTLES CRITICAL RESULT CALLED TO, READ BACK BY AND VERIFIED WITH: PHARMD NATHAN BELUE AT 2300 07/13/2021 [place] Performed at [facility], [address] [place], [place] [address]    Culture GRAM POSITIVE COCCI  Final   Report Status PENDING  Incomplete  Blood Culture ID Panel (Reflexed)     Status: Abnormal   Collection Time: 07/11/21  9:47 PM  Result Value Ref Range Status   Enterococcus  faecalis NOT DETECTED NOT DETECTED Final   Enterococcus Faecium NOT DETECTED NOT DETECTED Final   Listeria monocytogenes NOT DETECTED NOT DETECTED Final   Staphylococcus species DETECTED (A) NOT DETECTED Final    Comment: CRITICAL RESULT CALLED TO, READ BACK BY AND VERIFIED WITH: BRANDON BEERS AT 1332 07/13/21.PMF    Staphylococcus aureus (BCID) NOT DETECTED NOT DETECTED Final   Staphylococcus epidermidis DETECTED (A) NOT DETECTED Final    Comment: CRITICAL RESULT CALLED TO, READ BACK BY AND VERIFIED WITH: BRANDON BEERS AT 1332 07/13/21.PMF    Staphylococcus lugdunensis NOT DETECTED NOT DETECTED Final   Streptococcus species NOT DETECTED NOT DETECTED Final   Streptococcus agalactiae NOT DETECTED NOT DETECTED Final   Streptococcus pneumoniae NOT DETECTED NOT DETECTED Final   Streptococcus pyogenes NOT DETECTED NOT DETECTED Final   A.calcoaceticus-baumannii NOT DETECTED NOT DETECTED Final   Bacteroides fragilis NOT DETECTED NOT DETECTED Final   Enterobacterales NOT DETECTED NOT DETECTED Final   Enterobacter cloacae complex NOT DETECTED NOT DETECTED Final   Escherichia coli NOT DETECTED NOT DETECTED Final   Klebsiella aerogenes NOT DETECTED NOT DETECTED Final   Klebsiella oxytoca NOT DETECTED NOT DETECTED Final   Klebsiella pneumoniae NOT DETECTED NOT DETECTED Final   Proteus species NOT DETECTED NOT DETECTED Final   Salmonella species NOT DETECTED NOT DETECTED Final   Serratia marcescens NOT DETECTED NOT DETECTED Final   Haemophilus influenzae NOT DETECTED NOT DETECTED Final   Neisseria meningitidis NOT DETECTED NOT DETECTED Final   Pseudomonas aeruginosa NOT DETECTED NOT DETECTED Final   Stenotrophomonas maltophilia NOT DETECTED NOT DETECTED Final   Candida albicans NOT DETECTED NOT DETECTED Final   Candida auris NOT DETECTED NOT DETECTED Final   Candida glabrata NOT DETECTED NOT DETECTED Final   Candida krusei NOT DETECTED NOT DETECTED Final   Candida parapsilosis NOT DETECTED NOT  DETECTED Final   Candida tropicalis NOT DETECTED NOT DETECTED Final   Cryptococcus neoformans/gattii NOT DETECTED NOT DETECTED Final   Methicillin resistance mecA/C NOT DETECTED NOT DETECTED Final    Comment: Performed at [facility], [address] [place], [place] [address]  Urine Culture  Status: None   Collection Time: 07/11/21 11:08 PM   Specimen: Urine, Random  Result Value Ref Range Status   Specimen Description   Final    URINE, RANDOM Performed at [facility], [address], [place], [place] [address]    Special Requests   Final    NONE Performed at [facility], [address] [place], [place] [address]    Culture   Final    NO GROWTH Performed at [facility], [place] [address], [place], [place] [address]    Report Status 07/12/2021 FINAL  Final  MRSA Next Gen by PCR, Nasal     Status: None   Collection Time: 07/12/21  2:56 AM   Specimen: Nasal Mucosa; Nasal Swab  Result Value Ref Range Status   MRSA by PCR Next Gen NOT DETECTED NOT DETECTED Final    Comment: (NOTE) The GeneXpert MRSA Assay (FDA approved for NASAL specimens only), is one component of a comprehensive MRSA colonization surveillance program. It is not intended to diagnose MRSA infection nor to guide or monitor treatment for MRSA infections. Test performance is not FDA approved in patients less than 68 years old. Performed at [facility], [address] [place], [place] [address]           Radiology Studies: No results found.      Scheduled Meds:  amiodarone  400 mg Oral BID   aspirin EC  81 mg Oral Daily   atorvastatin  40 mg Oral QHS   budesonide (PULMICORT) nebulizer solution  0.25 mg Nebulization BID   Chlorhexidine Gluconate Cloth  6 each Topical Q0600   hydrocortisone sod succinate (SOLU-CORTEF) inj  100 mg Intravenous Q8H   ipratropium-albuterol  3 mL Nebulization TID   midodrine  5 mg Oral TID WC   Continuous Infusions:   cefTRIAXone (ROCEPHIN)  IV Stopped (07/14/21 1054)   heparin 1,700 Units/hr (07/14/21 1400)   norepinephrine (LEVOPHED) Adult infusion Stopped (07/12/21 1904)   vancomycin     [START ON 07/15/2021] vancomycin       LOS: 3 days    Time spent: 35 minutes.,     Elmarie Shiley, MD [facility]   If 7PM-7AM, please contact night-coverage [email or web address]  07/14/2021, 2:37 PM

## 2021-07-14 NOTE — Progress Notes (Signed)
Cross Cover Patient with prolonged QTc on monitor, nursing staff attempting to obtain EKG to confirm. Doxycycline ordered and azithromycin discontinued.

## 2021-07-14 NOTE — Progress Notes (Signed)
PHARMACY - PHYSICIAN COMMUNICATION CRITICAL VALUE ALERT - BLOOD CULTURE IDENTIFICATION (BCID)  BCID:  2nd aerobic bottle with [facility].  1st aerobic bottle reported yesterday afternoon with [facility] - Staph Epi.  Pt currently on ceftriaxone and azithromycin possible pneumonia.  He also has concerns for pyelonephritis d/t obstructive ureteral stone s/p stent placement 1/12, so Dr Sunnie Nielsen kept pt on current regimen and continue to monitor when notified by pharmacy.  Name of provider contacted: Cliffton Asters, NP  Changes to prescribed antibiotics required: Continue to monitor, no changes at this time.  Otelia Sergeant, PharmD, [facility] 07/14/2021 4:24 AM

## 2021-07-14 NOTE — Progress Notes (Signed)
ANTICOAGULATION CONSULT NOTE  Pharmacy Consult for heparin Indication: atrial fibrillation  Allergies  Allergen Reactions   Nucynta [Tapentadol] Nausea Only and Other (See Comments)    Dizziness, woozy.  Sick to his stomach   Codeine Other (See Comments)    Too drowsy    Patient Measurements: Height: 5\' 11"  (180.3 cm) Weight: 99.8 kg (220 lb 0.3 oz) IBW/kg (Calculated) : 75.3 Heparin Dosing Weight: 96 kg  Vital Signs: Temp: 98.2 F (36.8 C) (01/14 0700) Temp Source: Oral (01/14 0700) BP: 112/70 (01/14 1400) Pulse Rate: 58 (01/14 1400)  Labs: Recent Labs    07/11/21 2147 07/12/21 0022 07/12/21 0422 07/13/21 0338 07/13/21 0927 07/13/21 1620 07/14/21 0450 07/14/21 1502  HGB 13.5  --  12.6*  --   --   --  12.5*  --   HCT 38.0*  --  35.4*  --   --   --  35.2*  --   PLT 105*  --  96*  --   --   --  107*  --   APTT  --   --   --   --  31  --   --   --   LABPROT  --   --   --   --  13.8  --   --   --   INR  --   --   --   --  1.1  --   --   --   HEPARINUNFRC  --   --   --   --   --  <0.10* 0.24* 0.28*  CREATININE 3.55*  --  2.76* 1.23  --   --  1.06  --   TROPONINIHS 57* 50*  --   --   --   --   --   --     Estimated Creatinine Clearance: 84.7 mL/min (by C-G formula based on SCr of 1.06 mg/dL).  Medical History: Past Medical History:  Diagnosis Date   Bronchitis    CHRONIC   Chronic HFrEF (heart failure with reduced ejection fraction) (HCC)    a. 03/2021 Echo: EF 30-35%, GrI DD.   Coronary artery disease    a. 05/2012 Ant STEMI/PCI: LAD 100p (thrombectomy and 3.5x20 Veriflex BMS); b. 12/2014 Cath: patent stent; c. 03/2015 Cath: LM nl, LAD 10 ISR, 72m, D1 50p, LCX 20 diff throughout, RCA nl-->Med rx.   Erectile dysfunction    History of kidney stones    HOH (hard of hearing)    Hypercholesterolemia    Hypertension    Ischemic cardiomyopathy    a. 11/2015 Echo: EF 30-35%, diff HK; b. Refused ICD; c. 03/2021 Echo: EF 30-35%, GrI DD, nl RV fxn, mild MR.   MI  (myocardial infarction) (HCC) 06/19/2012   being considered for defibilator but patient now refuses this.   Nephrolithiasis    NSTEMI (non-ST elevated myocardial infarction) (HCC) 03/2015   Patent BMS - LAD stent.   Sleep apnea    Tobacco abuse    Varicose veins      Assessment: 66 year old male with known right kidney stone originally planned for lithotripsy as outpatient. Patient presented with AMS; concern for septic shock in the setting of suspected pyelonephritis. Patient underwent cystoscopy with stent placement 1/12. AKI in the setting of above, now improved. New onset atrial fibrillation, started on an amiodarone infusion. Pharmacy consult for heparin management.  Goal of Therapy:  Heparin level 0.3-0.7 units/ml Monitor platelets by anticoagulation protocol: Yes  Date/time  Level  Comment 1/13 @ 1620   < 0.10     Subtherapeutic @1300  un/hr(resulted at 2111) 1/14 0450  0.24,           subthera,inc to 1700u/hr 1/14 1502  0.28           subthera, inc to 1900 u/hr   Plan:  --Heparin 1500 unit bolus, then increase rate to 1900 units/hr --Check HL 6hrs after rate change --CBC daily while on heparin  2/14 PharmD Clinical Pharmacist 07/14/2021

## 2021-07-14 NOTE — Progress Notes (Addendum)
Pharmacy Antibiotic Note  [name] Walker is a 66 y.o. male w/ PMH of MI s/p PCI, ischemic cardiomyopathy, HTN, OSA, bronchitis and chronic kidney stones admitted on 07/11/2021 with  acute pyelonephritis.  Pharmacy has been consulted for vancomycin dosing.  Plan: start vancomycin 2000 mg IV x 1 then 750 mg IV Q 12 hrs  Goal AUC 400-550 Expected AUC: 450 SCr used: 1.06 mg/dL Ke" 4.163 h-1, A4/5: 36.4 h   Height: 5\' 11"  (180.3 cm) Weight: 99.8 kg (220 lb 0.3 oz) IBW/kg (Calculated) : 75.3  Temp (24hrs), Avg:98.6 F (37 C), Min:98.5 F (36.9 C), Max:98.6 F (37 C)  Recent Labs  Lab 07/11/21 2147 07/12/21 0028 07/12/21 0422 07/13/21 0338 07/14/21 0450  WBC 3.9*  --  6.7  --  9.7  CREATININE 3.55*  --  2.76* 1.23 1.06  LATICACIDVEN 1.6 1.3  --   --   --     Estimated Creatinine Clearance: 84.7 mL/min (by C-G formula based on SCr of 1.06 mg/dL).    Allergies  Allergen Reactions   Nucynta [Tapentadol] Nausea Only and Other (See Comments)    Dizziness, woozy.  Sick to his stomach   Codeine Other (See Comments)    Too drowsy    Antimicrobials this admission: 01/12 ceftriaxone >> 01/14 01/12 azithromycin >> 01/14 01/12 vancomycin x 1    01/14 >>    Microbiology results: 01/11 BCx: 4/4 S epidermidis 01/11 UCx: NG  01/11 MRSA PCR: negative 01/11 SARS CoV-2: negative 01/11 influenza A/B: negative  Thank you for allowing pharmacy to be a part of this patients care.  03/11 07/14/2021 1:35 PM

## 2021-07-14 NOTE — Progress Notes (Signed)
ANTICOAGULATION CONSULT NOTE  Pharmacy Consult for heparin Indication: atrial fibrillation  Allergies  Allergen Reactions   Nucynta [Tapentadol] Nausea Only and Other (See Comments)    Dizziness, woozy.  Sick to his stomach   Codeine Other (See Comments)    Too drowsy    Patient Measurements: Height: 5\' 11"  (180.3 cm) Weight: 99.8 kg (220 lb 0.3 oz) IBW/kg (Calculated) : 75.3 Heparin Dosing Weight: 96 kg  Vital Signs: Temp: 98.5 F (36.9 C) (01/14 0000) Temp Source: Oral (01/14 0000) BP: 96/62 (01/14 0000) Pulse Rate: 61 (01/14 0200)  Labs: Recent Labs    07/11/21 2147 07/12/21 0022 07/12/21 0422 07/13/21 0338 07/13/21 0927 07/13/21 1620 07/14/21 0450  HGB 13.5  --  12.6*  --   --   --  12.5*  HCT 38.0*  --  35.4*  --   --   --  35.2*  PLT 105*  --  96*  --   --   --  107*  APTT  --   --   --   --  31  --   --   LABPROT  --   --   --   --  13.8  --   --   INR  --   --   --   --  1.1  --   --   HEPARINUNFRC  --   --   --   --   --  <0.10* 0.24*  CREATININE 3.55*  --  2.76* 1.23  --   --  1.06  TROPONINIHS 57* 50*  --   --   --   --   --     Estimated Creatinine Clearance: 84.7 mL/min (by C-G formula based on SCr of 1.06 mg/dL).  Medical History: Past Medical History:  Diagnosis Date   Bronchitis    CHRONIC   Chronic HFrEF (heart failure with reduced ejection fraction) (HCC)    a. 03/2021 Echo: EF 30-35%, GrI DD.   Coronary artery disease    a. 05/2012 Ant STEMI/PCI: LAD 100p (thrombectomy and 3.5x20 Veriflex BMS); b. 12/2014 Cath: patent stent; c. 03/2015 Cath: LM nl, LAD 10 ISR, 70m, D1 50p, LCX 20 diff throughout, RCA nl-->Med rx.   Erectile dysfunction    History of kidney stones    HOH (hard of hearing)    Hypercholesterolemia    Hypertension    Ischemic cardiomyopathy    a. 11/2015 Echo: EF 30-35%, diff HK; b. Refused ICD; c. 03/2021 Echo: EF 30-35%, GrI DD, nl RV fxn, mild MR.   MI (myocardial infarction) (HCC) 06/19/2012   being considered for  defibilator but patient now refuses this.   Nephrolithiasis    NSTEMI (non-ST elevated myocardial infarction) (HCC) 03/2015   Patent BMS - LAD stent.   Sleep apnea    Tobacco abuse    Varicose veins      Assessment: 66 year old male with known right kidney stone originally planned for lithotripsy as outpatient. Patient presented with AMS; concern for septic shock in the setting of suspected pyelonephritis. Patient underwent cystoscopy with stent placement 1/12. AKI in the setting of above, now improved. New onset atrial fibrillation, started on an amiodarone infusion. Pharmacy consult for heparin management.  Goal of Therapy:  Heparin level 0.3-0.7 units/ml Monitor platelets by anticoagulation protocol: Yes  Date/time  Level  Comment 1/13 @ 1620   < 0.10  Subtherapeutic @1300  un/hr (resulted at 2111) 1/14 0450  0.24, subtherapeutic   Plan:  --Heparin 1500  unit bolus, then increase rate to 1700 units/hr --Check HL 6hrs after rate change --CBC daily while on heparin  Otelia Sergeant, PharmD, [facility] 07/14/2021 6:55 AM

## 2021-07-14 NOTE — Progress Notes (Addendum)
Progress Note  Patient Name: [name] Walker Date of Encounter: 07/14/2021  Primary Cardiologist: Sinclair Grooms, MD  Subjective   Maintaining sinus rhythm since yesterday afternoon.  Denies chest pain, dyspnea, palpitations, flank/back pain.  Inpatient Medications    Scheduled Meds:  amiodarone  400 mg Oral BID   budesonide (PULMICORT) nebulizer solution  0.25 mg Nebulization BID   Chlorhexidine Gluconate Cloth  6 each Topical Q0600   doxycycline  100 mg Oral Q12H   heparin  1,500 Units Intravenous Once   hydrocortisone sod succinate (SOLU-CORTEF) inj  100 mg Intravenous Q8H   ipratropium-albuterol  3 mL Nebulization TID   midodrine  10 mg Oral TID WC   Continuous Infusions:  cefTRIAXone (ROCEPHIN)  IV Stopped (07/13/21 1014)   heparin 1,500 Units/hr (07/13/21 2157)   norepinephrine (LEVOPHED) Adult infusion Stopped (07/12/21 1904)   PRN Meds: albuterol, docusate sodium, loperamide, polyethylene glycol   Vital Signs    Vitals:   07/14/21 0100 07/14/21 0200 07/14/21 0456 07/14/21 0700  BP:    124/63  Pulse: 63 61  (!) 56  Resp: 18   18  Temp:      TempSrc:      SpO2: (!) 89% (!) 89%  92%  Weight:   99.8 kg   Height:        Intake/Output Summary (Last 24 hours) at 07/14/2021 0747 Last data filed at 07/13/2021 1700 Gross per 24 hour  Intake 1101.25 ml  Output 1050 ml  Net 51.25 ml   Filed Weights   07/12/21 0500 07/13/21 0338 07/14/21 0456  Weight: 100.8 kg 101.2 kg 99.8 kg    Physical Exam   GEN: Well nourished, well developed, in no acute distress.  HEENT: Grossly normal.  Neck: Supple, no JVD, carotid bruits, or masses. Cardiac: RRR, no murmurs, rubs, or gallops. No clubbing, cyanosis, 1+ LLE edema w/ large varicose veins.  Radials 2+, DP/PT 2+ and equal bilaterally.  Respiratory:  Respirations regular and unlabored, scattered rhonchi throughout. GI: Soft, nontender, nondistended, BS + x 4. MS: no deformity or atrophy. Skin: warm and dry, no  rash. Neuro:  Strength and sensation are intact. Psych: AAOx3.  Normal affect.  Labs    Chemistry Recent Labs  Lab 07/11/21 2147 07/12/21 0422 07/13/21 0338 07/14/21 0450  NA 133* 135 138 138  K 3.2* 3.4* 3.1* 3.3*  CL 101 104 107 108  CO2 19* 19* 24 23  GLUCOSE 118* 127* 142* 155*  BUN 54* 49* 34* 31*  CREATININE 3.55* 2.76* 1.23 1.06  CALCIUM 8.5* 8.2* 8.5* 8.3*  PROT 6.4*  --   --   --   ALBUMIN 3.2*  --   --   --   AST 19  --   --   --   ALT 15  --   --   --   ALKPHOS 53  --   --   --   BILITOT 1.1  --   --   --   GFRNONAA 18* 25* >60 >60  ANIONGAP 13 12 7 7      Hematology Recent Labs  Lab 07/11/21 2147 07/12/21 0422 07/14/21 0450  WBC 3.9* 6.7 9.7  RBC 3.93* 3.69* 3.64*  HGB 13.5 12.6* 12.5*  HCT 38.0* 35.4* 35.2*  MCV 96.7 95.9 96.7  MCH 34.4* 34.1* 34.3*  MCHC 35.5 35.6 35.5  RDW 13.1 13.1 13.5  PLT 105* 96* 107*    Cardiac Enzymes  Recent Labs  Lab 07/11/21 2147 07/12/21  0022  TROPONINIHS 57* 50*      BNP Recent Labs  Lab 07/12/21 0022  BNP 223.1*     Lipids  Lab Results  Component Value Date   CHOL 155 04/23/2021   HDL 40 04/23/2021   LDLCALC 89 04/23/2021   TRIG 147 04/23/2021   CHOLHDL 3.9 04/23/2021    HbA1c  Lab Results  Component Value Date   HGBA1C 5.8 (H) 04/23/2021   Lab Results  Component Value Date   TSH 0.072 (L) 07/13/2021    Radiology    DG Chest Port 1 View  Result Date: 07/12/2021 CLINICAL DATA:  Central line placement EXAM: PORTABLE CHEST 1 VIEW COMPARISON:  Yesterday FINDINGS: New right IJ line with tip at the SVC. No pneumothorax or new mediastinal widening. Interstitial coarsening which is similar to prior. COPD by CT. Normal heart size. No effusion or consolidation. IMPRESSION: New central line without complicating feature. Stable aeration since yesterday. Electronically Signed   By: Jorje Guild M.D.   On: 07/12/2021 05:37   DG Chest Portable 1 View  Result Date: 07/11/2021 CLINICAL DATA:   Shortness of breath EXAM: PORTABLE CHEST 1 VIEW COMPARISON:  03/11/2015 FINDINGS: Central airways thickening with possible developing patchy infiltrate at left base. Normal cardiac size. No pneumothorax. Aortic atherosclerosis IMPRESSION: Central airways thickening suggesting bronchitis, with possible developing infiltrate at left base. Electronically Signed   By: Donavan Foil M.D.   On: 07/11/2021 22:11   DG OR UROLOGY CYSTO IMAGE ([facility] ONLY)  Result Date: 07/12/2021 There is no interpretation for this exam.  This order is for images obtained during a surgical procedure.  Please See "Surgeries" Tab for more information regarding the procedure.   CT CHEST ABDOMEN PELVIS WO CONTRAST  Addendum Date: 07/12/2021   ADDENDUM REPORT: 07/12/2021 04:21 ADDENDUM: This addendum is requested by urology. The 10 mm proximal right ureteral stone described above is obstructing, causing severe right hydronephrosis with perinephric stranding. Electronically Signed   By: Telford Nab M.D.   On: 07/12/2021 04:21   Result Date: 07/12/2021 CLINICAL DATA:  Pneumonia, complication suspected, xray done. Fever. Altered mental status. Shortness of breath. EXAM: CT CHEST, ABDOMEN AND PELVIS WITHOUT CONTRAST TECHNIQUE: Multidetector CT imaging of the chest, abdomen and pelvis was performed following the standard protocol without IV contrast. RADIATION DOSE REDUCTION: This exam was performed according to the departmental dose-optimization program which includes automated exposure control, adjustment of the mA and/or kV according to patient size and/or use of iterative reconstruction technique. COMPARISON:  Chest x-ray today.  CT 06/26/2021 FINDINGS: CT CHEST FINDINGS Cardiovascular: Heart is normal size. Aorta is normal caliber. Moderate coronary artery and scattered aortic calcifications. Mediastinum/Nodes: No mediastinal, hilar, or axillary adenopathy. Trachea and esophagus are unremarkable. Thyroid unremarkable. Lungs/Pleura:  Paraseptal and centrilobular emphysema. Predominantly linear opacities at the lung bases, favor atelectasis. No effusions. Musculoskeletal: Chest wall soft tissues are unremarkable. No acute bony abnormality. CT ABDOMEN PELVIS FINDINGS Hepatobiliary: No focal hepatic abnormality. Gallbladder unremarkable. Pancreas: No focal abnormality or ductal dilatation. Spleen: No focal abnormality.  Normal size. Adrenals/Urinary Tract: Severe right hydronephrosis and perinephric stranding due to 10 mm proximal right ureteral stone. No stones or hydronephrosis on the left. Adrenal glands and urinary bladder unremarkable. Stomach/Bowel: Sigmoid diverticulosis. No active diverticulitis. Stomach and small bowel decompressed, unremarkable. Vascular/Lymphatic: Aortic atherosclerosis. No evidence of aneurysm or adenopathy. Reproductive: No visible focal abnormality. Other: No free fluid or free air. Small bilateral inguinal hernias containing fat. Musculoskeletal: No acute bony abnormality. IMPRESSION: Coronary artery disease,  aortic atherosclerosis. Bibasilar opacities, favor atelectasis. 10 mm proximal right ureteral stone with severe right hydronephrosis and perinephric stranding. Aortic atherosclerosis. Sigmoid diverticulosis. Electronically Signed: By: Rolm Baptise M.D. On: 07/12/2021 00:56    Telemetry    Maintaining sinus rhythm since ~ 1320 on 1/13 - Personally Reviewed  Cardiac Studies   2D Echocardiogram 10.2022   1. Left ventricular ejection fraction, by estimation, is 30 to 35%. The left ventricle has moderately decreased function. The left ventricle demonstrates regional wall motion abnormalities (see scoring diagram/findings for description). The left ventricular internal cavity size was mildly to moderately dilated. Left ventricular diastolic parameters are consistent with Grade I diastolic dysfunction (impaired relaxation).   2. Right ventricular systolic function is normal. The right ventricular size is  mildly enlarged. Tricuspid regurgitation signal is inadequate for assessing PA pressure.   3. The mitral valve is grossly normal. Mild mitral valve regurgitation. No evidence of mitral stenosis.   4. The aortic valve is tricuspid. Aortic valve regurgitation is not visualized. No aortic stenosis is present.   5. The inferior vena cava is normal in size with greater than 50% respiratory variability, suggesting right atrial pressure of 3 mmHg.   Patient Profile      66 y.o. male with a history of CAD s/p anterior MI and LAD stenting in 2013, HFrEF, ICM (EF 30-35% - prev refused ICD), HTN, HL, tob abuse, OSA, and nephrolithiasis, admitted 1/12 w/ sepsis, hypotension, AKI, bacteremia, and nephrolithiasis now s/p R ureteral stenting, who developed afib and flutter w/ RVR on 1/13  Amio  Sinus rhythm.  Assessment & Plan    1.  PAF/flutter:  Pt presented 1/11 w/ AMS, fever, and hypotension in the setting of acute pyelonephritis and septic shock.  He initially required vasopressors.  He underwent right ureteral stenting on January 12, and early 1/13, he developed asymptomatic paroxysmal atrial fibrillation and flutter.  While in flutter, rates trended into the 150s. Following amio boluses x 2 + drip, he converted to sinus and has maintained sinus since ~ 1320 on 1/13.  He has felt well overnight.  CHA2DS2VASc = 3.  He is currently on amio 400 bid and heparin.  Pt tentatively scheduled for lithotripsy next Thursday w/ request to hold [facility] for 5-7 days prior to procedure.  Provided atrial arrhythmias remain quiescent, given short duration 1/13, he is likely best served by deferring [place] until after his urol procedures have been completed.  We can place monitor @ d/c to assess burden of afib following recovery from acute illness and defer decision of long term [place] to his primary cardiologist, pending monitoring.  Re: amio usage, ideally would cont throughout periprocedural period over the next few wks, though TSH  returned @ 0.072 yesterday.  Will reorder TSH and FT4 today.  If hyperthyroidism present, will need to d/c amio.  2.  Pyelonephritis/Septic shock/bacteremia:  BC w/ GPC x 2.  Abx per IM.  Azithromycin d/c'd due to concern to prolonged QT (490).  BPs remain soft and home meds on hold.  He is receiving midodrine - ideally short-term in light of LV dysfxn/CHF.  3.  CAD/Demand ischemia:  In the setting of #2, patient has had mild troponin elevation (57  50).  No c/p or dyspnea, even when tachycardic 1/13.  This represents demand ischemia and not ACS.  No plan for ischemic eval @ this time.  Resume home dose of statin.  ? blocker held in setting of hypotension/shock - follow bp's w/ plan to resume  when stable.  4.  Chronic HFrEF/ICM:  EF 30-35% by echo in 03/2021.  Euvolemic.  Home doses of beta-blocker, ARB, and [place] are currently on hold in the setting of hypotension on admission as well as acidosis.  We will look to resume as he stabilizes.  Continue to watch volume status closely.  5.  Tob Abuse:  cessation advised.  6.  OSA:  uses CPAP @ home.  7.  Prolonged QT: QTc 490 this AM in setting of hypokalemia, amiodarone, and azithromycin.  K supplementation ordered.  Azithromycin d/c'd.  Follow up Mg.  Follow.  8.  Hypokalemia:  Supplementation ordered.  F/u Mg this AM.  Signed, Murray Hodgkins, NP  07/14/2021, 7:47 AM    For questions or updates, please contact   Please consult [email or web address] for contact info under Cardiology/STEMI.

## 2021-07-14 NOTE — Progress Notes (Signed)
[place] for Electrolyte Monitoring and Replacement   Recent Labs: Potassium (mmol/L)  Date Value  07/14/2021 3.3 (L)   Magnesium (mg/dL)  Date Value  07/14/2021 2.0   Calcium (mg/dL)  Date Value  07/14/2021 8.3 (L)   Albumin (g/dL)  Date Value  07/11/2021 3.2 (L)  04/23/2021 4.4   Phosphorus (mg/dL)  Date Value  07/12/2021 3.7   Sodium (mmol/L)  Date Value  07/14/2021 138  05/28/2021 140   Corrected Ca: 8.9 mg/dL  Assessment: 66 year old male with known right kidney stone originally planned for lithotripsy as outpatient. Patient presented with AMS; concern for septic shock in the setting of suspected pyelonephritis. Patient underwent cystoscopy with stent placement 1/12. Patient is in the ICU weaning off vasopressors. Pharmacy consulted for electrolyte management.  Goal of Therapy:  Electrolytes WNL  Plan:  Potassium 40 mEq PO x 2 (MD order) Follow up with morning labs  Dallie Piles, PharmD, BCPS Clinical Pharmacist 07/14/2021 1:54 PM

## 2021-07-14 NOTE — Progress Notes (Signed)
ANTICOAGULATION CONSULT NOTE  Pharmacy Consult for heparin Indication: atrial fibrillation  Allergies  Allergen Reactions   Nucynta [Tapentadol] Nausea Only and Other (See Comments)    Dizziness, woozy.  Sick to his stomach   Codeine Other (See Comments)    Too drowsy    Patient Measurements: Height: 5\' 11"  (180.3 cm) Weight: 99.8 kg (220 lb 0.3 oz) IBW/kg (Calculated) : 75.3 Heparin Dosing Weight: 96 kg  Vital Signs: Temp: 97.7 F (36.5 C) (01/14 2000) Temp Source: Oral (01/14 2000) BP: 99/58 (01/14 2000) Pulse Rate: 59 (01/14 2000)  Labs: Recent Labs    07/12/21 0022 07/12/21 0422 07/13/21 0338 07/13/21 0927 07/13/21 1620 07/14/21 0450 07/14/21 1502 07/14/21 2212  HGB  --  12.6*  --   --   --  12.5*  --   --   HCT  --  35.4*  --   --   --  35.2*  --   --   PLT  --  96*  --   --   --  107*  --   --   APTT  --   --   --  31  --   --   --   --   LABPROT  --   --   --  13.8  --   --   --   --   INR  --   --   --  1.1  --   --   --   --   HEPARINUNFRC  --   --   --   --    < > 0.24* 0.28* 0.48  CREATININE  --  2.76* 1.23  --   --  1.06  --   --   TROPONINIHS 50*  --   --   --   --   --   --   --    < > = values in this interval not displayed.    Estimated Creatinine Clearance: 84.7 mL/min (by C-G formula based on SCr of 1.06 mg/dL).  Medical History: Past Medical History:  Diagnosis Date   Bronchitis    CHRONIC   Chronic HFrEF (heart failure with reduced ejection fraction) (HCC)    a. 03/2021 Echo: EF 30-35%, GrI DD.   Coronary artery disease    a. 05/2012 Ant STEMI/PCI: LAD 100p (thrombectomy and 3.5x20 Veriflex BMS); b. 12/2014 Cath: patent stent; c. 03/2015 Cath: LM nl, LAD 10 ISR, 25m, D1 50p, LCX 20 diff throughout, RCA nl-->Med rx.   Erectile dysfunction    History of kidney stones    HOH (hard of hearing)    Hypercholesterolemia    Hypertension    Ischemic cardiomyopathy    a. 11/2015 Echo: EF 30-35%, diff HK; b. Refused ICD; c. 03/2021 Echo: EF  30-35%, GrI DD, nl RV fxn, mild MR.   MI (myocardial infarction) (HCC) 06/19/2012   being considered for defibilator but patient now refuses this.   Nephrolithiasis    NSTEMI (non-ST elevated myocardial infarction) (HCC) 03/2015   Patent BMS - LAD stent.   Sleep apnea    Tobacco abuse    Varicose veins      Assessment: 66 year old male with known right kidney stone originally planned for lithotripsy as outpatient. Patient presented with AMS; concern for septic shock in the setting of suspected pyelonephritis. Patient underwent cystoscopy with stent placement 1/12. AKI in the setting of above, now improved. New onset atrial fibrillation, started on an amiodarone infusion. Pharmacy consult  for heparin management.  Goal of Therapy:  Heparin level 0.3-0.7 units/ml Monitor platelets by anticoagulation protocol: Yes  Date/time  Level  Comment 1/13 @ 1620   < 0.10     Subtherapeutic @1300  un/hr(resulted at 2111) 1/14 0450  0.24,           subthera,inc to 1700u/hr 1/14 1502  0.28           subthera, inc to 1900 u/hr 1/14 2212  0.48  Therapeutic x 1   Plan:  --Continue heparin infusion rate at 1900 units/hr --Recheck HL w/ AM labs to confirm --CBC daily while on heparin  2213, PharmD, [facility] 07/14/2021 11:35 PM

## 2021-07-14 NOTE — Plan of Care (Signed)
#  Staph epi + blood Cx on 1/11 --2/2 Cx+ GPC with BCID+ Staph Epi->could represent 2 different CoNS Recommendations: -D/c doxycyline -Start vancomycin. Follow 1/11 blood Cx and if 2 different species of CoNS then D/C vancomycin

## 2021-07-15 LAB — BASIC METABOLIC PANEL
Anion gap: 8 (ref 5–15)
BUN: 26 mg/dL — ABNORMAL HIGH (ref 8–23)
CO2: 23 mmol/L (ref 22–32)
Calcium: 8.2 mg/dL — ABNORMAL LOW (ref 8.9–10.3)
Chloride: 109 mmol/L (ref 98–111)
Creatinine, Ser: 0.92 mg/dL (ref 0.61–1.24)
GFR, Estimated: >60 mL/min (ref >60)
Glucose, Bld: 132 mg/dL — ABNORMAL HIGH (ref 70–99)
Potassium: 3.7 mmol/L (ref 3.5–5.1)
Sodium: 140 mmol/L (ref 135–145)

## 2021-07-15 LAB — CBC
HCT: 34.9 % — ABNORMAL LOW (ref 39.0–52.0)
Hemoglobin: 12.1 g/dL — ABNORMAL LOW (ref 13.0–17.0)
MCH: 33.1 pg (ref 26.0–34.0)
MCHC: 34.7 g/dL (ref 30.0–36.0)
MCV: 95.4 fL (ref 80.0–100.0)
Platelets: 123 10*3/uL — ABNORMAL LOW (ref 150–400)
RBC: 3.66 MIL/uL — ABNORMAL LOW (ref 4.22–5.81)
RDW: 13.8 % (ref 11.5–15.5)
WBC: 10.2 10*3/uL (ref 4.0–10.5)
nRBC: 0 % (ref 0.0–0.2)

## 2021-07-15 LAB — MAGNESIUM: Magnesium: 1.6 mg/dL — ABNORMAL LOW (ref 1.7–2.4)

## 2021-07-15 LAB — HEPARIN LEVEL (UNFRACTIONATED): Heparin Unfractionated: 0.53 IU/mL (ref 0.30–0.70)

## 2021-07-15 MED ORDER — ATORVASTATIN CALCIUM 20 MG PO TABS
20.0000 mg | ORAL_TABLET | Freq: Every day | ORAL | Status: DC
  Administered 2021-07-15 – 2021-07-16 (×2): 20 mg via ORAL
  Filled 2021-07-15 (×2): qty 1

## 2021-07-15 MED ORDER — HYDROCORTISONE SOD SUC (PF) 100 MG IJ SOLR
50.0000 mg | Freq: Two times a day (BID) | INTRAMUSCULAR | Status: DC
  Administered 2021-07-15 – 2021-07-17 (×4): 50 mg via INTRAVENOUS
  Filled 2021-07-15 (×4): qty 2

## 2021-07-15 MED ORDER — DAPAGLIFLOZIN PROPANEDIOL 5 MG PO TABS
5.0000 mg | ORAL_TABLET | Freq: Every day | ORAL | Status: DC
  Administered 2021-07-16 – 2021-07-17 (×2): 5 mg via ORAL
  Filled 2021-07-15 (×2): qty 1

## 2021-07-15 MED ORDER — DAPAGLIFLOZIN PROPANEDIOL 10 MG PO TABS
10.0000 mg | ORAL_TABLET | Freq: Every day | ORAL | Status: DC
  Administered 2021-07-15: 5 mg via ORAL
  Filled 2021-07-15: qty 1

## 2021-07-15 MED ORDER — MAGNESIUM SULFATE 2 GM/50ML IV SOLN
2.0000 g | Freq: Once | INTRAVENOUS | Status: AC
  Administered 2021-07-15: 2 g via INTRAVENOUS
  Filled 2021-07-15: qty 50

## 2021-07-15 NOTE — Progress Notes (Signed)
PHARMACY CONSULT NOTE  Pharmacy Consult for Electrolyte Monitoring and Replacement   Recent Labs: Potassium (mmol/L)  Date Value  07/15/2021 3.7   Magnesium (mg/dL)  Date Value  81/44/8185 1.6 (L)   Calcium (mg/dL)  Date Value  63/14/9702 8.2 (L)   Albumin (g/dL)  Date Value  63/78/5885 3.2 (L)  04/23/2021 4.4   Phosphorus (mg/dL)  Date Value  02/77/4128 3.7   Sodium (mmol/L)  Date Value  07/15/2021 140  05/28/2021 140   Corrected Ca: 8.9 mg/dL  Assessment: 66 year old male with known right kidney stone originally planned for lithotripsy as outpatient. Patient presented with AMS; concern for septic shock in the setting of suspected pyelonephritis. Patient underwent cystoscopy with stent placement 1/12. Patient is in the ICU weaning off vasopressors. Pharmacy consulted for electrolyte management.  Goal of Therapy:  Electrolytes WNL  Plan:  Mag 1.6,  MD ordered Magnesium sulfate 2 gm IV x1 Follow up electrolytes with morning labs  Angelique Blonder, PharmD Clinical Pharmacist 07/15/2021 9:21 AM

## 2021-07-15 NOTE — Progress Notes (Signed)
[place] for heparin Indication: atrial fibrillation  Allergies  Allergen Reactions   Nucynta [Tapentadol] Nausea Only and Other (See Comments)    Dizziness, woozy.  Sick to his stomach   Codeine Other (See Comments)    Too drowsy    Patient Measurements: Height: 5\' 11"  (180.3 cm) Weight: 107.9 kg (237 lb 14 oz) IBW/kg (Calculated) : 75.3 Heparin Dosing Weight: 96 kg  Vital Signs: Temp: 98.2 F (36.8 C) (01/15 0400) Temp Source: Oral (01/15 0400) BP: 127/73 (01/15 0700) Pulse Rate: 55 (01/15 0700)  Labs: Recent Labs    07/13/21 0338 07/13/21 0927 07/13/21 1620 07/14/21 0450 07/14/21 1502 07/14/21 2212 07/15/21 0451  HGB  --   --   --  12.5*  --   --  12.1*  HCT  --   --   --  35.2*  --   --  34.9*  PLT  --   --   --  107*  --   --  123*  APTT  --  31  --   --   --   --   --   LABPROT  --  13.8  --   --   --   --   --   INR  --  1.1  --   --   --   --   --   HEPARINUNFRC  --   --    < > 0.24* 0.28* 0.48 0.53  CREATININE 1.23  --   --  1.06  --   --  0.92   < > = values in this interval not displayed.    Estimated Creatinine Clearance: 101.3 mL/min (by C-G formula based on SCr of 0.92 mg/dL).  Medical History: Past Medical History:  Diagnosis Date   Bronchitis    CHRONIC   Chronic HFrEF (heart failure with reduced ejection fraction) ([place])    a. 03/2021 Echo: EF 30-35%, GrI DD.   Coronary artery disease    a. 05/2012 Ant STEMI/PCI: LAD 100p (thrombectomy and 3.5x20 Veriflex BMS); b. 12/2014 Cath: patent stent; c. 03/2015 Cath: LM nl, LAD 10 ISR, 37m, D1 50p, LCX 20 diff throughout, RCA nl-->Med rx.   Erectile dysfunction    History of kidney stones    HOH (hard of hearing)    Hypercholesterolemia    Hypertension    Ischemic cardiomyopathy    a. 11/2015 Echo: EF 30-35%, diff HK; b. Refused ICD; c. 03/2021 Echo: EF 30-35%, GrI DD, nl RV fxn, mild MR.   MI (myocardial infarction) ([place]) 06/19/2012   being considered for  defibilator but patient now refuses this.   Nephrolithiasis    NSTEMI (non-ST elevated myocardial infarction) ([place]) 03/2015   Patent BMS - LAD stent.   Sleep apnea    Tobacco abuse    Varicose veins      Assessment: 66 year old male with known right kidney stone originally planned for lithotripsy as outpatient. Patient presented with AMS; concern for septic shock in the setting of suspected pyelonephritis. Patient underwent cystoscopy with stent placement 1/12. AKI in the setting of above, now improved. New onset atrial fibrillation, started on an amiodarone infusion. Pharmacy consult for heparin management.  Goal of Therapy:  Heparin level 0.3-0.7 units/ml Monitor platelets by anticoagulation protocol: Yes  Date/time  Level  Comment 1/13 @ 1620   < 0.10     Subtherapeutic @1300  un/hr(resulted at 2111) 1/14 0450  0.24,  subthera,inc to 1700u/hr 1/14 1502  0.28           subthera, inc to 1900 u/hr 1/14 2212  0.48  Therap x 1 1/15 0451  0.53  Thera x2   Plan:  --Continue heparin infusion rate at 1900 units/hr --f/u HL w/ AM labs  --CBC daily while on heparin  Chinita Greenland PharmD Clinical Pharmacist 07/15/2021

## 2021-07-15 NOTE — Progress Notes (Signed)
PROGRESS NOTE    [name] Walker  [id number] DOB: [birth year] DOA: 07/11/2021 PCP: Tracie Harrier, MD   Brief Narrative: 66 year old with past medical history significant for CAD, history of anterior MI in 2013 secondary to occluded proximal LAD that was treated with PCI stents, ischemic cardiomyopathy with ejection fraction [id number], chronic systolic heart failure, ongoing tobacco abuse, hypertension, OSA, chronic kidney stones presented with altered mental status.  Per family patient developed fevers and associated right flank pain the day prior to admission.  He started to have nausea vomiting 2 days prior to admission, also hematuria.  Patient was admitted by critical care team for sepsis secondary to obstructive uropathy, UTI, systolic blood pressure in the 60.  AKI with a creatinine of 3.5.  CT chest abdomen and pelvis showed obstructing 10 mm proximal right ureteral stone with severe right hydronephrosis and perinephric stranding.  Patient was a started on IV fluids, broad-spectrum IV antibiotic, Levophed.  He underwent cystoscopy with right stent placement on 1/12 by Dr. Yves Dill.  His care was transferred to [facility] 1/13.  He develops A fib with RVR with low blood pressure.  Cardiology was consulted, patient was started on IV amiodarone, and heparin drip.   Assessment & Plan:   Principal Problem:   Sepsis with hypotension ([place]) Active Problems:   Tobacco abuse   Chronic systolic heart failure (HCC)   Dyslipidemia   Kidney stone on right side   AKI (acute kidney injury) ([place])   Acute unilateral obstructive uropathy   Atrial fibrillation (HCC)   HFrEF (heart failure with reduced ejection fraction) ([place])   1-Sepsis with Septic shock secondary to acute obstructive pyelonephritis and possible underlying pneumonia: -Patient admitted to the ICU, he received IV fluids, broad-spectrum antibiotics and IV pressors -He underwent cystoscopy with right stent placement on 1/12 by Dr.  Eliberto Ivory. -Continue with IV ceftriaxone.  -Urine culture no growth to date.  -On IV stress dose steroids.  -Started on Midodrine. Hold if SBP above 120 -Blood culture: 2 anaerobic Blood culture positive, Gram positive cocci, BCID ; stap Epi, final report pending. Discussed with ID, started on IV Vancomycin and stop doxy. Continue ceftriaxone.  I have repeated Blood culture 1/14. So far negative.    2-AKI secondary to acute obstructing uropathy secondary to right ureteral lithiasis -Patient underwent cystoscopy and right ureteral stent placement 1/12 -Renal function significantly improved, creatinine peak to 2.5, it has decreased to 1.2. -He received IV fluids. -He will need to follow-up with Dr. Yves Dill  for lithotripsy and ureteral stent removal  3-A fib RVR, with Hypotension;  -He was on Coreg at home, unable to do beta-blockers due to hypotension -He was a started on IV amiodarone drip. Now on oral amiodarone.  -He was also started on heparin drip. Continue while in house.  -Cardiology consulted and following.  -Patient will need cardiac monitor at discharge. He will also need oral anticoagulation.   4--Hypokalemia: Replaced.  5--Hypomagnesemia: replete IV.   6-Acute on chronic hypoxic respiratory failure secondary to possible pneumonia, AECOPD  -Past medical history COPD, current tobacco abuse, OSA: Continue with  oxygen supplementation. Continue with incentive spirometry. Continue with nebulizer, Pulmicort.  On IV antibiotics for PNA> ceftriaxone.   7-Chronic systolic heart failure, last known ejection fraction 35% History of hypertension, CAD anterior MI 2013 s/p PCI to LAD.  Ischemic cardiomyopathy, HLD -Continue to hold Carvedilol and Entresto in the setting of hypotension.  8-Acute metabolic encephalopathy due to sepsis: Improved.   Low TSH. Free T 3 and  T 4 Pending.  [id number]  Estimated body mass index is 33.18 kg/m as calculated from the following:   Height as of this  encounter: 5\' 11"  (1.803 m).   Weight as of this encounter: 107.9 kg.   DVT prophylaxis: Heparin gtt Code Status: Full Code Family Communication: care discussed with patient.  Disposition Plan:  Status is: Inpatient  Remains inpatient appropriate because: Awaiting Final Blood culture results from 1/11/        Consultants:  CCM admitted patient.  Cardiology   Procedures:  Cystoscopy, ureteral stent placement.   Antimicrobials:  Ceftriaxone  Zithromax  Subjective: No new complaints feels better. BP improved.  Objective: Vitals:   07/15/21 0400 07/15/21 0500 07/15/21 0600 07/15/21 0700  BP: (!) 103/59 121/69 133/71 127/73  Pulse: 70 (!) 54 (!) 59 (!) 55  Resp: (!) 29 16 15 18   Temp: 98.2 F (36.8 C)     TempSrc: Oral     SpO2: 93% 92% 92% 92%  Weight:  107.9 kg    Height:        Intake/Output Summary (Last 24 hours) at 07/15/2021 1116 Last data filed at 07/15/2021 1037 Gross per 24 hour  Intake 1461.29 ml  Output 700 ml  Net 761.29 ml    Filed Weights   07/13/21 0338 07/14/21 0456 07/15/21 0500  Weight: 101.2 kg 99.8 kg 107.9 kg    Examination:  General exam: NAD Respiratory system: CTA Cardiovascular system: S 1, S 2 RRR Gastrointestinal system: BS present, soft, nt Central nervous system: Alert, follows command Extremities: no edema   Data Reviewed: I have personally reviewed following labs and imaging studies  CBC: Recent Labs  Lab 07/11/21 2147 07/12/21 0422 07/14/21 0450 07/15/21 0451  WBC 3.9* 6.7 9.7 10.2  NEUTROABS 3.1  --   --   --   HGB 13.5 12.6* 12.5* 12.1*  HCT 38.0* 35.4* 35.2* 34.9*  MCV 96.7 95.9 96.7 95.4  PLT 105* 96* 107* 123*    Basic Metabolic Panel: Recent Labs  Lab 07/11/21 2147 07/12/21 0422 07/13/21 0338 07/14/21 0450 07/14/21 0539 07/15/21 0451  NA 133* 135 138 138  --  140  K 3.2* 3.4* 3.1* 3.3*  --  3.7  CL 101 104 107 108  --  109  CO2 19* 19* 24 23  --  23  GLUCOSE 118* 127* 142* 155*  --  132*   BUN 54* 49* 34* 31*  --  26*  CREATININE 3.55* 2.76* 1.23 1.06  --  0.92  CALCIUM 8.5* 8.2* 8.5* 8.3*  --  8.2*  MG  --  1.5* 2.1  --  2.0 1.6*  PHOS  --  3.7  --   --   --   --     GFR: Estimated Creatinine Clearance: 101.3 mL/min (by C-G formula based on SCr of 0.92 mg/dL). Liver Function Tests: Recent Labs  Lab 07/11/21 2147  AST 19  ALT 15  ALKPHOS 53  BILITOT 1.1  PROT 6.4*  ALBUMIN 3.2*    No results for input(s): LIPASE, AMYLASE in the last 168 hours. No results for input(s): AMMONIA in the last 168 hours. Coagulation Profile: Recent Labs  Lab 07/13/21 0927  INR 1.1    Cardiac Enzymes: No results for input(s): CKTOTAL, CKMB, CKMBINDEX, TROPONINI in the last 168 hours. BNP (last 3 results) No results for input(s): PROBNP in the last 8760 hours. HbA1C: No results for input(s): HGBA1C in the last 72 hours. CBG: Recent Labs  Lab 07/12/21  0254  GLUCAP 127*    Lipid Profile: No results for input(s): CHOL, HDL, LDLCALC, TRIG, CHOLHDL, LDLDIRECT in the last 72 hours. Thyroid Function Tests: Recent Labs    07/13/21 0927  TSH 0.072*    Anemia Panel: No results for input(s): VITAMINB12, FOLATE, FERRITIN, TIBC, IRON, RETICCTPCT in the last 72 hours. Sepsis Labs: Recent Labs  Lab 07/11/21 2147 07/12/21 0022 07/12/21 0028  PROCALCITON  --  30.36  --   LATICACIDVEN 1.6  --  1.3     Recent Results (from the past 240 hour(s))  Resp Panel by RT-PCR (Flu A&B, Covid) Nasopharyngeal Swab     Status: None   Collection Time: 07/11/21  8:03 PM   Specimen: Nasopharyngeal Swab; Nasopharyngeal(NP) swabs in vial transport medium  Result Value Ref Range Status   SARS Coronavirus 2 by RT PCR NEGATIVE NEGATIVE Final    Comment: (NOTE) SARS-CoV-2 target nucleic acids are NOT DETECTED.  The SARS-CoV-2 RNA is generally detectable in upper respiratory specimens during the acute phase of infection. The lowest concentration of SARS-CoV-2 viral copies this assay can  detect is 138 copies/mL. A negative result does not preclude SARS-Cov-2 infection and should not be used as the sole basis for treatment or other patient management decisions. A negative result may occur with  improper specimen collection/handling, submission of specimen other than nasopharyngeal swab, presence of viral mutation(s) within the areas targeted by this assay, and inadequate number of viral copies(<138 copies/mL). A negative result must be combined with clinical observations, patient history, and epidemiological information. The expected result is Negative.  Fact Sheet for Patients:  EntrepreneurPulse.com.au  Fact Sheet for Healthcare Providers:  IncredibleEmployment.be  This test is no t yet approved or cleared by the [place] FDA and  has been authorized for detection and/or diagnosis of SARS-CoV-2 by FDA under an Emergency Use Authorization (EUA). This EUA will remain  in effect (meaning this test can be used) for the duration of the COVID-19 declaration under Section 564(b)(1) of the Act, 21 U.S.C.section 360bbb-3(b)(1), unless the authorization is terminated  or revoked sooner.       Influenza A by PCR NEGATIVE NEGATIVE Final   Influenza B by PCR NEGATIVE NEGATIVE Final    Comment: (NOTE) The Xpert Xpress SARS-CoV-2/FLU/RSV plus assay is intended as an aid in the diagnosis of influenza from Nasopharyngeal swab specimens and should not be used as a sole basis for treatment. Nasal washings and aspirates are unacceptable for Xpert Xpress SARS-CoV-2/FLU/RSV testing.  Fact Sheet for Patients: EntrepreneurPulse.com.au  Fact Sheet for Healthcare Providers: IncredibleEmployment.be  This test is not yet approved or cleared by the [place] FDA and has been authorized for detection and/or diagnosis of SARS-CoV-2 by FDA under an Emergency Use Authorization (EUA). This EUA will remain in  effect (meaning this test can be used) for the duration of the COVID-19 declaration under Section 564(b)(1) of the Act, 21 U.S.C. section 360bbb-3(b)(1), unless the authorization is terminated or revoked.  Performed at [facility], [address] [place], [place] [address]   Blood culture (routine x 2)     Status: Abnormal (Preliminary result)   Collection Time: 07/11/21  9:47 PM   Specimen: BLOOD  Result Value Ref Range Status   Specimen Description   Final    BLOOD LEFT HAND Performed at [facility], [address] [place], [place] [address]    Special Requests   Final    BOTTLES DRAWN AEROBIC AND ANAEROBIC Blood Culture adequate volume Performed  at [facility], [address] [place], [place] [address]    Culture  Setup Time   Final    GRAM POSITIVE COCCI ANAEROBIC BOTTLE ONLY CRITICAL RESULT CALLED TO, READ BACK BY AND VERIFIED WITH: BRANDON BEERS AT [id number] 07/13/21.PMF    Culture (A)  Final    STAPHYLOCOCCUS EPIDERMIDIS SUSCEPTIBILITIES TO FOLLOW Performed at [facility], [place] [address] [place], [place] [address]    Report Status PENDING  Incomplete  Blood culture (routine x 2)     Status: Abnormal (Preliminary result)   Collection Time: 07/11/21  9:47 PM   Specimen: BLOOD  Result Value Ref Range Status   Specimen Description   Final    BLOOD RIGHT ASSIST CONTROL Performed at [facility], [address], [place], [place] [address]    Special Requests   Final    BOTTLES DRAWN AEROBIC AND ANAEROBIC Blood Culture results may not be optimal due to an excessive volume of blood received in culture bottles Performed at [facility], [address] [place], [place] [address]    Culture  Setup Time   Final    GRAM POSITIVE COCCI IN BOTH AEROBIC AND ANAEROBIC BOTTLES CRITICAL RESULT CALLED TO, READ BACK BY AND VERIFIED WITH: PHARMD NATHAN BELUE AT [place] 07/13/2021 GA Performed at [facility], [address] [place], [place] [address]    Culture STAPHYLOCOCCUS EPIDERMIDIS (A)  Final   Report Status PENDING  Incomplete  Blood Culture ID Panel (Reflexed)     Status: Abnormal   Collection Time: 07/11/21  9:47 PM  Result Value Ref Range Status   Enterococcus faecalis NOT DETECTED NOT DETECTED Final   Enterococcus Faecium NOT DETECTED NOT DETECTED Final   Listeria monocytogenes NOT DETECTED NOT DETECTED Final   Staphylococcus species DETECTED (A) NOT DETECTED Final    Comment: CRITICAL RESULT CALLED TO, READ BACK BY AND VERIFIED WITH: BRANDON BEERS AT 1332 07/13/21.PMF    Staphylococcus aureus (BCID) NOT DETECTED NOT DETECTED Final   Staphylococcus epidermidis DETECTED (A) NOT DETECTED Final    Comment: CRITICAL RESULT CALLED TO, READ BACK BY AND VERIFIED WITH: BRANDON BEERS AT 1332 07/13/21.PMF    Staphylococcus lugdunensis NOT DETECTED NOT DETECTED Final   Streptococcus species NOT DETECTED NOT DETECTED Final   Streptococcus agalactiae NOT DETECTED NOT DETECTED Final   Streptococcus pneumoniae NOT DETECTED NOT DETECTED Final   Streptococcus pyogenes NOT DETECTED NOT DETECTED Final   A.calcoaceticus-baumannii NOT DETECTED NOT DETECTED Final   Bacteroides fragilis NOT DETECTED NOT DETECTED Final   Enterobacterales NOT DETECTED NOT DETECTED Final   Enterobacter cloacae complex NOT DETECTED NOT DETECTED Final   Escherichia coli NOT DETECTED NOT DETECTED Final   Klebsiella aerogenes NOT DETECTED NOT DETECTED Final   Klebsiella oxytoca NOT DETECTED NOT DETECTED Final   Klebsiella pneumoniae NOT DETECTED NOT DETECTED Final   Proteus species NOT DETECTED NOT DETECTED Final   Salmonella species NOT DETECTED NOT DETECTED Final   Serratia marcescens NOT DETECTED NOT DETECTED Final   Haemophilus influenzae NOT DETECTED NOT DETECTED Final   Neisseria meningitidis NOT DETECTED NOT DETECTED Final   Pseudomonas aeruginosa NOT DETECTED NOT DETECTED Final   Stenotrophomonas maltophilia NOT  DETECTED NOT DETECTED Final   Candida albicans NOT DETECTED NOT DETECTED Final   Candida auris NOT DETECTED NOT DETECTED Final   Candida glabrata NOT DETECTED NOT DETECTED Final   Candida krusei NOT DETECTED NOT DETECTED Final   Candida parapsilosis NOT DETECTED NOT DETECTED Final  Candida tropicalis NOT DETECTED NOT DETECTED Final   Cryptococcus neoformans/gattii NOT DETECTED NOT DETECTED Final   Methicillin resistance mecA/C NOT DETECTED NOT DETECTED Final    Comment: Performed at [facility], [address] [place], [place] [address]  Urine Culture     Status: None   Collection Time: 07/11/21 11:08 PM   Specimen: Urine, Random  Result Value Ref Range Status   Specimen Description   Final    URINE, RANDOM Performed at [facility], [address], [place], [place] [address]    Special Requests   Final    NONE Performed at [facility], [address] [place], [place] [address]    Culture   Final    NO GROWTH Performed at [facility], [place] [address], [place], [place] [address]    Report Status 07/12/2021 FINAL  Final  MRSA Next Gen by PCR, Nasal     Status: None   Collection Time: 07/12/21  2:56 AM   Specimen: Nasal Mucosa; Nasal Swab  Result Value Ref Range Status   MRSA by PCR Next Gen NOT DETECTED NOT DETECTED Final    Comment: (NOTE) The GeneXpert MRSA Assay (FDA approved for NASAL specimens only), is one component of a comprehensive MRSA colonization surveillance program. It is not intended to diagnose MRSA infection nor to guide or monitor treatment for MRSA infections. Test performance is not FDA approved in patients less than 70 years old. Performed at [facility], [address] [place], [place] [address]   CULTURE, BLOOD (ROUTINE X 2) w Reflex to ID Panel     Status: None (Preliminary result)   Collection Time: 07/14/21  5:25 AM   Specimen: BLOOD  Result Value Ref Range Status   Specimen Description BLOOD  BLOOD LEFT HAND  Final   Special Requests   Final    BOTTLES DRAWN AEROBIC AND ANAEROBIC Blood Culture adequate volume   Culture   Final    NO GROWTH 1 DAY Performed at [facility], [address] [place], [place] [address]    Report Status PENDING  Incomplete  CULTURE, BLOOD (ROUTINE X 2) w Reflex to ID Panel     Status: None (Preliminary result)   Collection Time: 07/14/21  5:30 AM   Specimen: BLOOD  Result Value Ref Range Status   Specimen Description BLOOD BLOOD RIGHT HAND  Final   Special Requests   Final    BOTTLES DRAWN AEROBIC AND ANAEROBIC Blood Culture adequate volume   Culture   Final    NO GROWTH 1 DAY Performed at [facility], [address], [place], [place] [address]    Report Status PENDING  Incomplete          Radiology Studies: No results found.      Scheduled Meds:  amiodarone  400 mg Oral BID   aspirin EC  81 mg Oral Daily   atorvastatin  20 mg Oral QHS   budesonide (PULMICORT) nebulizer solution  0.25 mg Nebulization BID   Chlorhexidine Gluconate Cloth  6 each Topical Q0600   dapagliflozin propanediol  10 mg Oral Daily   hydrocortisone sod succinate (SOLU-CORTEF) inj  50 mg Intravenous Q12H   ipratropium-albuterol  3 mL Nebulization TID   Continuous Infusions:  cefTRIAXone (ROCEPHIN)  IV 2 g (07/15/21 1116)   heparin 1,900 Units/hr (07/15/21 0943)   vancomycin Stopped (07/15/21 0625)     LOS: 4 days    Time spent: 35 minutes.,  Elmarie Shiley, MD [facility]   If 7PM-7AM, please contact night-coverage [email or web address]  07/15/2021, 11:16 AM

## 2021-07-15 NOTE — Progress Notes (Signed)
Pharmacy Antibiotic Note  [name] Walker is a 66 y.o. male w/ PMH of MI s/p PCI, ischemic cardiomyopathy, HTN, OSA, bronchitis and chronic kidney stones admitted on 07/11/2021 with  acute pyelonephritis. Bactermia-Bcx: GPC.  Pharmacy has been consulted for vancomycin dosing.  Plan:  -vancomycin 750 mg IV Q 12 hrs  Goal AUC 400-550 Expected AUC: 450 SCr used: 1.06 mg/dL Ke" 7.741 h-1, O8/7: 86.7 h  F/u Blood cx, Scr   Height: 5\' 11"  (180.3 cm) Weight: 107.9 kg (237 lb 14 oz) IBW/kg (Calculated) : 75.3  Temp (24hrs), Avg:98.2 F (36.8 C), Min:97.7 F (36.5 C), Max:98.9 F (37.2 C)  Recent Labs  Lab 07/11/21 2147 07/12/21 0028 07/12/21 0422 07/13/21 0338 07/14/21 0450 07/15/21 0451  WBC 3.9*  --  6.7  --  9.7 10.2  CREATININE 3.55*  --  2.76* 1.23 1.06 0.92  LATICACIDVEN 1.6 1.3  --   --   --   --      Estimated Creatinine Clearance: 101.3 mL/min (by C-G formula based on SCr of 0.92 mg/dL).    Allergies  Allergen Reactions   Nucynta [Tapentadol] Nausea Only and Other (See Comments)    Dizziness, woozy.  Sick to his stomach   Codeine Other (See Comments)    Too drowsy    Antimicrobials this admission: 01/12 ceftriaxone >> 01/14 01/12 azithromycin >> 01/14 01/12 vancomycin x 1    01/14 >>    Microbiology results: 01/11 BCx: 4/4 GPC,   BCID:S epidermidis 01/11 UCx: NG  01/11 MRSA PCR: negative 01/11 SARS CoV-2: negative 01/11 influenza A/B: negative  Thank you for allowing pharmacy to be a part of this patients care.  Marjoria Mancillas A 07/15/2021 9:24 AM

## 2021-07-15 NOTE — Progress Notes (Signed)
Progress Note  Patient Name: [name] Walker Date of Encounter: 07/15/2021  [facility] Cardiologist: Belva Crome III, MD   Subjective   No acute events overnight, denies chest pain or shortness of breath.  Tried taking Farxiga 10 mg as outpatient but this caused some dizziness, reduce to 5 mg daily per primary cardiologist.  Inpatient Medications    Scheduled Meds:  amiodarone  400 mg Oral BID   aspirin EC  81 mg Oral Daily   atorvastatin  20 mg Oral QHS   budesonide (PULMICORT) nebulizer solution  0.25 mg Nebulization BID   Chlorhexidine Gluconate Cloth  6 each Topical Q0600   dapagliflozin propanediol  10 mg Oral Daily   hydrocortisone sod succinate (SOLU-CORTEF) inj  50 mg Intravenous Q12H   ipratropium-albuterol  3 mL Nebulization TID   Continuous Infusions:  cefTRIAXone (ROCEPHIN)  IV 2 g (07/15/21 1116)   heparin 1,900 Units/hr (07/15/21 0943)   vancomycin Stopped (07/15/21 0625)   PRN Meds: albuterol, docusate sodium, loperamide, polyethylene glycol   Vital Signs    Vitals:   07/15/21 0400 07/15/21 0500 07/15/21 0600 07/15/21 0700  BP: (!) 103/59 121/69 133/71 127/73  Pulse: 70 (!) 54 (!) 59 (!) 55  Resp: (!) 29 16 15 18   Temp: 98.2 F (36.8 C)     TempSrc: Oral     SpO2: 93% 92% 92% 92%  Weight:  107.9 kg    Height:        Intake/Output Summary (Last 24 hours) at 07/15/2021 1414 Last data filed at 07/15/2021 1410 Gross per 24 hour  Intake 1019.29 ml  Output 925 ml  Net 94.29 ml   Last 3 Weights 07/15/2021 07/14/2021 07/13/2021  Weight (lbs) 237 lb 14 oz 220 lb 0.3 oz 223 lb 1.7 oz  Weight (kg) 107.9 kg 99.8 kg 101.2 kg      Telemetry    Sinus bradycardia heart rate 55- Personally Reviewed  ECG     - Personally Reviewed  Physical Exam   GEN: No acute distress.   Neck: No JVD Cardiac: RRR, no murmurs, rubs, or gallops.  Respiratory: Diminished breath sounds at bases, clear anteriorly GI: Soft, nontender, non-distended  MS: No edema; No  deformity. Neuro:  Nonfocal  Psych: Normal affect   Labs    High Sensitivity Troponin:   Recent Labs  Lab 07/11/21 2147 07/12/21 0022  TROPONINIHS 57* 50*     Chemistry Recent Labs  Lab 07/11/21 2147 07/12/21 0422 07/13/21 0338 07/14/21 0450 07/14/21 0539 07/15/21 0451  NA 133*   < > 138 138  --  140  K 3.2*   < > 3.1* 3.3*  --  3.7  CL 101   < > 107 108  --  109  CO2 19*   < > 24 23  --  23  GLUCOSE 118*   < > 142* 155*  --  132*  BUN 54*   < > 34* 31*  --  26*  CREATININE 3.55*   < > 1.23 1.06  --  0.92  CALCIUM 8.5*   < > 8.5* 8.3*  --  8.2*  MG  --    < > 2.1  --  2.0 1.6*  PROT 6.4*  --   --   --   --   --   ALBUMIN 3.2*  --   --   --   --   --   AST 19  --   --   --   --   --  ALT 15  --   --   --   --   --   ALKPHOS 53  --   --   --   --   --   BILITOT 1.1  --   --   --   --   --   GFRNONAA 18*   < > >60 >60  --  >60  ANIONGAP 13   < > 7 7  --  8   < > = values in this interval not displayed.    Lipids No results for input(s): CHOL, TRIG, HDL, LABVLDL, LDLCALC, CHOLHDL in the last 168 hours.  Hematology Recent Labs  Lab 07/12/21 0422 07/14/21 0450 07/15/21 0451  WBC 6.7 9.7 10.2  RBC 3.69* 3.64* 3.66*  HGB 12.6* 12.5* 12.1*  HCT 35.4* 35.2* 34.9*  MCV 95.9 96.7 95.4  MCH 34.1* 34.3* 33.1  MCHC 35.6 35.5 34.7  RDW 13.1 13.5 13.8  PLT 96* 107* 123*   Thyroid  Recent Labs  Lab 07/13/21 0927  TSH 0.072*    BNP Recent Labs  Lab 07/12/21 0022  BNP 223.1*    DDimer No results for input(s): DDIMER in the last 168 hours.   Radiology    No results found.  Cardiac Studies   TTE 03/2021 1. Left ventricular ejection fraction, by estimation, is 30 to 35%. The  left ventricle has moderately decreased function. The left ventricle  demonstrates regional wall motion abnormalities (see scoring  diagram/findings for description). The left  ventricular internal cavity size was mildly to moderately dilated. Left  ventricular diastolic parameters  are consistent with Grade I diastolic  dysfunction (impaired relaxation).   2. Right ventricular systolic function is normal. The right ventricular  size is mildly enlarged. Tricuspid regurgitation signal is inadequate for  assessing PA pressure.   3. The mitral valve is grossly normal. Mild mitral valve regurgitation.  No evidence of mitral stenosis.   4. The aortic valve is tricuspid. Aortic valve regurgitation is not  visualized. No aortic stenosis is present.   5. The inferior vena cava is normal in size with greater than 50%  respiratory variability, suggesting right atrial pressure of 3 mmHg.   Patient Profile     66 y.o. male with history of CAD/PCI 2013, HFrEF EF 30 to 35%, presenting with altered mental status, fever, hypotension, diagnosed with pyelonephritis and UTI induced sepsis s/p ureteral stent.  Being seen for A. fib RVR.  Assessment & Plan    1.  A. fib RVR -Maintaining sinus rhythm on amiodarone -A. fib in the setting of sepsis.  Urological procedure being planned next week. -cont po amio, heparin -hold anticoagulation upon discharge if patient stays in sinus rhythm -Consider cardiac monitor on discharge. -He may not need long-term anticoagulation.  To be determined by primary cardiologist at follow-up after cardiac monitor.   2.  Cardiomyopathy EF 30 to 35% -Appears euvolemic -Stop midodrine.  Monitor BP. -Restart PTA CHF meds when BP permits -Restart PTA Farxiga.  3.  History of CAD/PCI -Denies chest pain -Resume Lipitor, aspirin.   4.  UTI, pyelonephritis s/p ureteral stent -Antibiotics as per primary team, urology. -Lithotripsy being planned.   Greater than 50% was spent in counseling and coordination of care with patient Total encounter time 35 minutes or more      Signed, Kate Sable, MD  07/15/2021, 2:14 PM

## 2021-07-15 NOTE — Progress Notes (Signed)
°  Amiodarone Drug - Drug Interaction Consult Note  Amiodarone is metabolized by the cytochrome P450 system and therefore has the potential to cause many drug interactions. Amiodarone has an average plasma half-life of 50 days (range 20 to 100 days).   There is potential for drug interactions to occur several weeks or months after stopping treatment and the onset of drug interactions may be slow after initiating amiodarone.   [x]  Statins: Increased risk of myopathy. Simvastatin- restrict dose  to 20mg  daily. Other statins: counsel patients to report any muscle pain or weakness immediately. On atorvastatin 40 mg daily (PTA).  []  Anticoagulants: Amiodarone can increase anticoagulant effect. Consider warfarin dose reduction. Patients should be monitored closely and the dose of anticoagulant altered accordingly, remembering that amiodarone levels take several weeks to stabilize.  []  Antiepileptics: Amiodarone can increase plasma concentration of phenytoin, the dose should be reduced. Note that small changes in phenytoin dose can result in large changes in levels. Monitor patient and counsel on signs of toxicity.  [x]  Beta blockers: increased risk of bradycardia, AV block and myocardial depression. Sotalol - avoid concomitant use.  On carvedilol 6.25 mg BID PTA- not currently ordered.  []   Calcium channel blockers (diltiazem and verapamil): increased risk of bradycardia, AV block and myocardial depression.  []   Cyclosporine: Amiodarone increases levels of cyclosporine. Reduced dose of cyclosporine is recommended.  []  Digoxin dose should be halved when amiodarone is started.  []  Diuretics: increased risk of cardiotoxicity if hypokalemia occurs.  []  Oral hypoglycemic agents (glyburide, glipizide, glimepiride): increased risk of hypoglycemia. Patient's glucose levels should be monitored closely when initiating amiodarone therapy.   []  Drugs that prolong the QT interval:  Torsades de pointes risk may  be increased with concurrent use - avoid if possible.  Monitor QTc, also keep magnesium/potassium WNL if concurrent therapy can't be avoided.  Antibiotics: e.g. fluoroquinolones, erythromycin.  Antiarrhythmics: e.g. quinidine, procainamide, disopyramide, sotalol.  Antipsychotics: e.g. phenothiazines, haloperidol.   Lithium, tricyclic antidepressants, and methadone.  Thank You,   , PharmD Clinical Pharmacist 07/15/2021 9:26 AM

## 2021-07-16 DIAGNOSIS — I4891 Unspecified atrial fibrillation: Secondary | ICD-10-CM | POA: Diagnosis not present

## 2021-07-16 DIAGNOSIS — I255 Ischemic cardiomyopathy: Secondary | ICD-10-CM | POA: Diagnosis not present

## 2021-07-16 DIAGNOSIS — N390 Urinary tract infection, site not specified: Secondary | ICD-10-CM | POA: Diagnosis not present

## 2021-07-16 DIAGNOSIS — N139 Obstructive and reflux uropathy, unspecified: Secondary | ICD-10-CM | POA: Diagnosis not present

## 2021-07-16 DIAGNOSIS — I5022 Chronic systolic (congestive) heart failure: Secondary | ICD-10-CM

## 2021-07-16 DIAGNOSIS — I2510 Atherosclerotic heart disease of native coronary artery without angina pectoris: Secondary | ICD-10-CM

## 2021-07-16 LAB — CBC
HCT: 35 % — ABNORMAL LOW (ref 39.0–52.0)
Hemoglobin: 12.2 g/dL — ABNORMAL LOW (ref 13.0–17.0)
MCH: 33 pg (ref 26.0–34.0)
MCHC: 34.9 g/dL (ref 30.0–36.0)
MCV: 94.6 fL (ref 80.0–100.0)
Platelets: 143 10*3/uL — ABNORMAL LOW (ref 150–400)
RBC: 3.7 MIL/uL — ABNORMAL LOW (ref 4.22–5.81)
RDW: 13.6 % (ref 11.5–15.5)
WBC: 9.9 10*3/uL (ref 4.0–10.5)
nRBC: 0 % (ref 0.0–0.2)

## 2021-07-16 LAB — CULTURE, BLOOD (ROUTINE X 2): Special Requests: ADEQUATE

## 2021-07-16 LAB — THYROID PANEL WITH TSH
Free Thyroxine Index: 1.6 (ref 1.2–4.9)
T3 Uptake Ratio: 35 % (ref 24–39)
T4, Total: 4.5 ug/dL (ref 4.5–12.0)
TSH: 0.361 u[IU]/mL — ABNORMAL LOW (ref 0.450–4.500)

## 2021-07-16 LAB — BASIC METABOLIC PANEL
Anion gap: 7 (ref 5–15)
BUN: 24 mg/dL — ABNORMAL HIGH (ref 8–23)
CO2: 21 mmol/L — ABNORMAL LOW (ref 22–32)
Calcium: 8 mg/dL — ABNORMAL LOW (ref 8.9–10.3)
Chloride: 109 mmol/L (ref 98–111)
Creatinine, Ser: 0.97 mg/dL (ref 0.61–1.24)
GFR, Estimated: >60 mL/min (ref >60)
Glucose, Bld: 121 mg/dL — ABNORMAL HIGH (ref 70–99)
Potassium: 3.7 mmol/L (ref 3.5–5.1)
Sodium: 137 mmol/L (ref 135–145)

## 2021-07-16 LAB — MAGNESIUM: Magnesium: 1.9 mg/dL (ref 1.7–2.4)

## 2021-07-16 LAB — HEPARIN LEVEL (UNFRACTIONATED): Heparin Unfractionated: 0.69 IU/mL (ref 0.30–0.70)

## 2021-07-16 LAB — PHOSPHORUS: Phosphorus: 4.5 mg/dL (ref 2.5–4.6)

## 2021-07-16 MED ORDER — ENOXAPARIN SODIUM 40 MG/0.4ML IJ SOSY
40.0000 mg | PREFILLED_SYRINGE | INTRAMUSCULAR | Status: DC
  Administered 2021-07-16: 40 mg via SUBCUTANEOUS
  Filled 2021-07-16: qty 0.4

## 2021-07-16 MED ORDER — AMIODARONE HCL 200 MG PO TABS
200.0000 mg | ORAL_TABLET | Freq: Two times a day (BID) | ORAL | Status: DC
  Administered 2021-07-16 – 2021-07-17 (×2): 200 mg via ORAL
  Filled 2021-07-16 (×2): qty 1

## 2021-07-16 MED ORDER — IPRATROPIUM-ALBUTEROL 0.5-2.5 (3) MG/3ML IN SOLN
3.0000 mL | Freq: Two times a day (BID) | RESPIRATORY_TRACT | Status: DC
  Administered 2021-07-16 – 2021-07-17 (×2): 3 mL via RESPIRATORY_TRACT
  Filled 2021-07-16 (×2): qty 3

## 2021-07-16 MED ORDER — POTASSIUM CHLORIDE CRYS ER 20 MEQ PO TBCR
40.0000 meq | EXTENDED_RELEASE_TABLET | Freq: Once | ORAL | Status: AC
  Administered 2021-07-16: 40 meq via ORAL
  Filled 2021-07-16: qty 2

## 2021-07-16 MED ORDER — CARVEDILOL 3.125 MG PO TABS
3.1250 mg | ORAL_TABLET | Freq: Two times a day (BID) | ORAL | Status: DC
  Administered 2021-07-16: 3.125 mg via ORAL
  Filled 2021-07-16 (×2): qty 1

## 2021-07-16 MED ORDER — SACUBITRIL-VALSARTAN 24-26 MG PO TABS
1.0000 | ORAL_TABLET | Freq: Two times a day (BID) | ORAL | Status: DC
  Administered 2021-07-16 – 2021-07-17 (×3): 1 via ORAL
  Filled 2021-07-16 (×4): qty 1

## 2021-07-16 NOTE — Evaluation (Signed)
Physical Therapy Evaluation Patient Details Name: [name] Walker MRN: [id number] DOB: 13-Nov-1955 Today's Date: 07/16/2021  History of Present Illness  66 y.o. male with history of CAD/PCI 2013, HFrEF EF 30 to 35%, prior MI, LAD stenting 2013, HTN, HLD, and kidney stones presenting with AMS, fever, hypotension and diagnosed with pyelonephritis and UTI induced sepsis. Now s/p cystoscopy + R ureteral stent on 1/12. Also developed A. fib RVR.   Clinical Impression  Patient received in bed, reports he is going well. Feeling a lot better. Patient is independent with bed mobility. Transfers independently and walked 300 feet, no AD, no difficulty. Patient will be able to walk with nursing staff going forward. He does not require skilled PT at this time.        Recommendations for follow up therapy are one component of a multi-disciplinary discharge planning process, led by the attending physician.  Recommendations may be updated based on patient status, additional functional criteria and insurance authorization.  Follow Up Recommendations No PT follow up    Assistance Recommended at Discharge None  Patient can return home with the following       Equipment Recommendations None recommended by PT  Recommendations for Other Services       Functional Status Assessment Patient has had a recent decline in their functional status and demonstrates the ability to make significant improvements in function in a reasonable and predictable amount of time.     Precautions / Restrictions Precautions Precautions: None Restrictions Weight Bearing Restrictions: No      Mobility  Bed Mobility Overal bed mobility: Independent                  Transfers Overall transfer level: Independent                      Ambulation/Gait Ambulation/Gait assistance: Independent Gait Distance (Feet): 300 Feet Assistive device: None Gait Pattern/deviations: WFL(Within Functional Limits) Gait  velocity: decr        Stairs            Wheelchair Mobility    Modified Rankin (Stroke Patients Only)       Balance Overall balance assessment: Independent                                           Pertinent Vitals/Pain Pain Assessment: No/denies pain    Home Living Family/patient expects to be discharged to:: Private residence Living Arrangements: Spouse/significant other Available Help at Discharge: Family;Available 24 hours/day Type of Home: House Home Access: Stairs to enter   [employer] of Steps: front 2 steps, garage w/c accessible   Home Layout: One level Home Equipment: None      Prior Function Prior Level of Function : Independent/Modified Independent;Working/employed;Driving                     Hand Dominance   Dominant Hand: Right    Extremity/Trunk Assessment   Upper Extremity Assessment Upper Extremity Assessment: Overall WFL for tasks assessed    Lower Extremity Assessment Lower Extremity Assessment: Overall WFL for tasks assessed    Cervical / Trunk Assessment Cervical / Trunk Assessment: Normal  Communication   Communication: No difficulties  Cognition Arousal/Alertness: Awake/alert Behavior During Therapy: WFL for tasks assessed/performed Overall Cognitive Status: Within Functional Limits for tasks assessed  General Comments      Exercises     Assessment/Plan    PT Assessment Patient does not need any further PT services  PT Problem List Decreased strength;Decreased mobility       PT Treatment Interventions      PT Goals (Current goals can be found in the Care Plan section)  Acute Rehab PT Goals Patient Stated Goal: to return home PT Goal Formulation: With patient Time For Goal Achievement: 07/16/21 Potential to Achieve Goals: Good    Frequency       Co-evaluation               AM-PAC PT "6 Clicks"  Mobility  Outcome Measure Help needed turning from your back to your side while in a flat bed without using bedrails?: None Help needed moving from lying on your back to sitting on the side of a flat bed without using bedrails?: None Help needed moving to and from a bed to a chair (including a wheelchair)?: None Help needed standing up from a chair using your arms (e.g., wheelchair or bedside chair)?: None Help needed to walk in hospital room?: None Help needed climbing 3-5 steps with a railing? : None 6 Click Score: 24    End of Session   Activity Tolerance: Patient tolerated treatment well Patient left: in bed Nurse Communication: Mobility status PT Visit Diagnosis: Muscle weakness (generalized) (M62.81)    Time: 1610-9604 PT Time Calculation (min) (ACUTE ONLY): 14 min   Charges:   PT Evaluation $PT Eval Moderate Complexity: 1 Mod          [place] Espina, PT, GCS 07/16/21,10:36 AM

## 2021-07-16 NOTE — TOC Progression Note (Signed)
Transition of Care [facility]) - Progression Note    Patient Details  Name: [name] Walker MRN: [id number] Date of Birth: 1956-01-12  [facility] of [facility]) CM/SW Contact  Allayne Butcher, RN Phone Number: 07/16/2021, 4:48 PM  Clinical Narrative:     Transition of Care [facility]) Screening Note   Patient Details  Name: [name] Walker Date of Birth: [birth year]   Transition of Care [facility]) CM/SW Contact:    Allayne Butcher, RN Phone Number: 07/16/2021, 4:48 PM    Transition of Care Department [facility]) has reviewed patient and no TOC needs have been identified at this time. We will continue to monitor patient advancement through interdisciplinary progression rounds. If new patient transition needs arise, please place a TOC consult.          Expected Discharge Plan and Services                                                 Social Determinants of Health (SDOH) Interventions    Readmission Risk Interventions No flowsheet data found.

## 2021-07-16 NOTE — Progress Notes (Signed)
PHARMACY CONSULT NOTE  Pharmacy Consult for Electrolyte Monitoring and Replacement   Recent Labs: Potassium (mmol/L)  Date Value  07/16/2021 3.7   Magnesium (mg/dL)  Date Value  40/98/1191 1.9   Calcium (mg/dL)  Date Value  47/82/9562 8.0 (L)   Albumin (g/dL)  Date Value  13/01/6577 3.2 (L)  04/23/2021 4.4   Phosphorus (mg/dL)  Date Value  46/96/2952 4.5   Sodium (mmol/L)  Date Value  07/16/2021 137  05/28/2021 140    Assessment: 66 year old male with known right kidney stone originally planned for lithotripsy as outpatient. Patient presented with AMS; concern for septic shock in the setting of suspected pyelonephritis. Patient underwent cystoscopy with stent placement 1/12. Patient is in the ICU weaning off vasopressors. Pharmacy consulted for electrolyte management.  Goal of Therapy:  Electrolytes within normal limits  Plan:  K 3.7, will give Kcl 40 mEq PO x 1 dose given patient is on amiodarone to target K >= 4 Patient care transferred from PCCM to [facility]. Will discontinue electrolyte consult at this time. Defer further ordering of labs and electrolyte replacement to hospitalist Pharmacy will continue to monitor peripherally  Tressie Ellis 07/16/2021 8:02 AM

## 2021-07-16 NOTE — Consult Note (Signed)
NAME: [name] Walker  DOB: 1956-01-29  MRN: [id number]  Date/Time: 07/16/2021 10:34 AM  REQUESTING PROVIDER: Dr.regalado Subjective:  REASON FOR CONSULT: hydronephrosis/complicated UTI ? [name] Walker is a 66 y.o. male with a history of CAD status post stent placement of LAD, ischemic cardiomyopathy with EF of less than [id number], chronic systolic heart failure, hypertension, OSA, chronic kidney stones, tobacco use presents with fever, confusion and chills and altered mental status on 07/11/2021.Marland Kitchen   As per the wife patient also had right flank pain and hematuria Pt has had rt flank pain for the past few weeks and had seen urology as OP for the rt renal stone and was scheduled for surgery on 07/19/21. He saw his PCP on 07/08/21 for ongoing pain- He was given levaquin 500mg  for 10 days In the ED temperature was 37.2, heart rate 87, BP 67/36 respiratory rate 18 and sats 92%. WBC 3.9, platelet 105, procalcitonin 30 and lactate 2.1, BUN 54 creatinine 3.55 and sodium 133 and potassium 3.2.  CT chest abdomen pelvis done which showed a 10 mm proximal right ureteral stone with severe right hydronephrosis and perinephric stranding.  Patient was seen by the ICU team.  He was given IV fluids and broad-spectrum antibiotics including vancomycin cefepime and Flagyl for septic shock.  He was also started on IV Levophed.   Blood culture came back as Staph epidermidis.  And urine culture was negative.  UA showed 11-20 RBCs and 6-10 WBC.  He was taken by urologist for stent placement. He developed A. fib with RVR and was started on amiodarone.  He has been seen by cardiologist.  He is also on anticoagulation with heparin.  He is now in sinus rhythm.  I am asked to see the patient for staph epi bacteremia.  Past Medical History:  Diagnosis Date   Bronchitis    CHRONIC   Chronic HFrEF (heart failure with reduced ejection fraction) ([place])    a. 03/2021 Echo: EF 30-35%, GrI DD.   Coronary artery disease    a. 05/2012 Ant  STEMI/PCI: LAD 100p (thrombectomy and 3.5x20 Veriflex BMS); b. 12/2014 Cath: patent stent; c. 03/2015 Cath: LM nl, LAD 10 ISR, 70m, D1 50p, LCX 20 diff throughout, RCA nl-->Med rx.   Erectile dysfunction    History of kidney stones    HOH (hard of hearing)    Hypercholesterolemia    Hypertension    Ischemic cardiomyopathy    a. 11/2015 Echo: EF 30-35%, diff HK; b. Refused ICD; c. 03/2021 Echo: EF 30-35%, GrI DD, nl RV fxn, mild MR.   MI (myocardial infarction) ([place]) 06/19/2012   being considered for defibilator but patient now refuses this.   Nephrolithiasis    NSTEMI (non-ST elevated myocardial infarction) ([place]) 03/2015   Patent BMS - LAD stent.   Sleep apnea    Tobacco abuse    Varicose veins     Past Surgical History:  Procedure Laterality Date   CARDIAC CATHETERIZATION N/A 01/09/2015   Procedure: Left Heart Cath and Coronary Angiography;  Surgeon: Belva Crome, MD;  Location: [facility];  Service: Cardiovascular;  Laterality: N/A;   CARDIAC CATHETERIZATION N/A 03/13/2015   Procedure: Left Heart Cath and Coronary Angiography;  Surgeon: Belva Crome, MD;  Location: [facility];  Service: Cardiovascular;  Laterality: N/A;   CORONARY ANGIOPLASTY     STENT   CORONARY STENT PLACEMENT     CYSTOSCOPY W/ URETERAL STENT REMOVAL Right 05/13/2017   Procedure: CYSTOSCOPY WITH STENT REMOVAL;  Surgeon: Royston Cowper, MD;  Location: [facility];  Service: Urology;  Laterality: Right;   CYSTOSCOPY WITH STENT PLACEMENT Right 03/07/2015   Procedure: CYSTOSCOPY WITH STENT PLACEMENT;  Surgeon: Royston Cowper, MD;  Location: [facility];  Service: Urology;  Laterality: Right;   CYSTOSCOPY WITH STENT PLACEMENT Right 03/25/2017   Procedure: CYSTOSCOPY WITH STENT PLACEMENT;  Surgeon: Royston Cowper, MD;  Location: [facility];  Service: Urology;  Laterality: Right;   CYSTOSCOPY WITH STENT PLACEMENT Right 07/12/2021   Procedure: CYSTOSCOPY WITH STENT PLACEMENT;  Surgeon: Royston Cowper, MD;   Location: [facility];  Service: Urology;  Laterality: Right;   CYSTOSCOPY/RETROGRADE/URETEROSCOPY Right 02/24/2018   Procedure: CYSTOSCOPY/RETROGRADE/URETEROSCOPY;  Surgeon: Royston Cowper, MD;  Location: [facility];  Service: Urology;  Laterality: Right;   CYSTOSCOPY/RETROGRADE/URETEROSCOPY/STONE EXTRACTION WITH BASKET Right 03/25/2017   Procedure: CYSTOSCOPY/RETROGRADE/URETEROSCOPY/STONE EXTRACTION WITH BASKET;  Surgeon: Royston Cowper, MD;  Location: [facility];  Service: Urology;  Laterality: Right;   EXTRACORPOREAL SHOCK WAVE LITHOTRIPSY Right 03/09/2015   Procedure: EXTRACORPOREAL SHOCK WAVE LITHOTRIPSY (ESWL);  Surgeon: Royston Cowper, MD;  Location: [facility];  Service: Urology;  Laterality: Right;   EXTRACORPOREAL SHOCK WAVE LITHOTRIPSY Right 05/11/2015   Procedure: EXTRACORPOREAL SHOCK WAVE LITHOTRIPSY (ESWL);  Surgeon: Royston Cowper, MD;  Location: [facility];  Service: Urology;  Laterality: Right;   EXTRACORPOREAL SHOCK WAVE LITHOTRIPSY Right 04/10/2017   Procedure: EXTRACORPOREAL SHOCK WAVE LITHOTRIPSY (ESWL);  Surgeon: Royston Cowper, MD;  Location: [facility];  Service: Urology;  Laterality: Right;   LEFT HEART CATHETERIZATION WITH CORONARY ANGIOGRAM Right 06/19/2012   Procedure: LEFT HEART CATHETERIZATION WITH CORONARY ANGIOGRAM;  Surgeon: Sinclair Grooms, MD;  Location: [facility];  Service: Cardiovascular;  Laterality: Right;   PERCUTANEOUS CORONARY STENT INTERVENTION (PCI-S) Right 06/19/2012   Procedure: PERCUTANEOUS CORONARY STENT INTERVENTION (PCI-S);  Surgeon: Sinclair Grooms, MD;  Location: [facility];  Service: Cardiovascular;  Laterality: Right;   URETEROSCOPY WITH HOLMIUM LASER LITHOTRIPSY Right 02/24/2018   Procedure: URETEROSCOPY;  Surgeon: Royston Cowper, MD;  Location: [facility];  Service: Urology;  Laterality: Right;    Social History   Socioeconomic History   Marital status: Single    Spouse name: Not on file   Number of children: Not on file   Years of  education: Not on file   Highest education level: Not on file  Occupational History   Not on file  Tobacco Use   Smoking status: Every Day    Packs/day: 0.50    Years: 40.00    Pack years: 20.00    Types: Cigarettes   Smokeless tobacco: Never  Vaping Use   Vaping Use: Never used  Substance and Sexual Activity   Alcohol use: Yes    Alcohol/week: 0.0 standard drinks    Comment: occ   Drug use: No   Sexual activity: Not on file  Other Topics Concern   Not on file  Social History Narrative   Not on file   Social Determinants of Health   Financial Resource Strain: Not on file  Food Insecurity: Not on file  Transportation Needs: Not on file  Physical Activity: Not on file  Stress: Not on file  Social Connections: Not on file  Intimate Partner Violence: Not on file    Family History  Problem Relation Age of Onset   Cancer Father    Heart murmur Mother    Heart attack Brother    Allergies  Allergen Reactions   Nucynta [  Tapentadol] Nausea Only and Other (See Comments)    Dizziness, woozy.  Sick to his stomach   Codeine Other (See Comments)    Too drowsy   I? Current Facility-Administered Medications  Medication Dose Route Frequency Provider Last Rate Last Admin   albuterol (PROVENTIL) (2.5 MG/3ML) 0.083% nebulizer solution 2.5 mg  2.5 mg Nebulization Q2H PRN Regalado, Belkys A, MD       amiodarone (PACERONE) tablet 200 mg  200 mg Oral BID Kathlyn Sacramento A, MD       aspirin EC tablet 81 mg  81 mg Oral Daily Regalado, Belkys A, MD   81 mg at 07/16/21 0836   atorvastatin (LIPITOR) tablet 20 mg  20 mg Oral QHS Regalado, Belkys A, MD   20 mg at 07/15/21 2134   budesonide (PULMICORT) nebulizer solution 0.25 mg  0.25 mg Nebulization BID Regalado, Belkys A, MD   0.25 mg at 07/16/21 0739   carvedilol (COREG) tablet 3.125 mg  3.125 mg Oral BID WC Kathlyn Sacramento A, MD       cefTRIAXone (ROCEPHIN) 2 g in sodium chloride 0.9 % 100 mL IVPB  2 g Intravenous Q24H Regalado, Belkys A,  MD   Stopped at 07/16/21 0911   Chlorhexidine Gluconate Cloth 2 % PADS 6 each  6 each Topical Q0600 Regalado, Belkys A, MD   6 each at 07/16/21 0837   dapagliflozin propanediol (FARXIGA) tablet 5 mg  5 mg Oral Daily Agbor-Etang, Aaron Edelman, MD   5 mg at 07/16/21 0837   docusate sodium (COLACE) capsule 100 mg  100 mg Oral BID PRN Regalado, Belkys A, MD       [START ON 07/17/2021] enoxaparin (LOVENOX) injection 40 mg  40 mg Subcutaneous Q24H Arida, Muhammad A, MD       hydrocortisone sodium succinate (SOLU-CORTEF) 100 MG injection 50 mg  50 mg Intravenous Q12H Regalado, Belkys A, MD   50 mg at 07/16/21 0836   ipratropium-albuterol (DUONEB) 0.5-2.5 (3) MG/3ML nebulizer solution 3 mL  3 mL Nebulization TID Regalado, Belkys A, MD   3 mL at 07/16/21 0739   loperamide (IMODIUM) capsule 4 mg  4 mg Oral PRN Regalado, Belkys A, MD   4 mg at 07/13/21 0800   polyethylene glycol (MIRALAX / GLYCOLAX) packet 17 g  17 g Oral Daily PRN Regalado, Belkys A, MD       sacubitril-valsartan (ENTRESTO) 24-26 mg per tablet  1 tablet Oral BID Wellington Hampshire, MD   1 tablet at 07/16/21 0947   vancomycin (VANCOREADY) IVPB 750 mg/150 mL  750 mg Intravenous Q12H Dallie Piles, [facility]   Stopped at 07/16/21 [id number]     Abtx:  Anti-infectives (From admission, onward)    Start     Dose/Rate Route Frequency Ordered Stop   07/15/21 0600  vancomycin (VANCOREADY) IVPB 750 mg/150 mL        750 mg 150 mL/hr over 60 Minutes Intravenous Every 12 hours 07/14/21 1351     07/14/21 1500  vancomycin (VANCOREADY) IVPB 2000 mg/400 mL        2,000 mg 200 mL/hr over 120 Minutes Intravenous  Once 07/14/21 1351 07/14/21 1706   07/14/21 1000  doxycycline (VIBRA-TABS) tablet 100 mg  Status:  Discontinued        100 mg Oral Every 12 hours 07/14/21 0622 07/14/21 1328   07/12/21 1000  cefTRIAXone (ROCEPHIN) 2 g in sodium chloride 0.9 % 100 mL IVPB        2 g 200 mL/hr over  30 Minutes Intravenous Every 24 hours 07/11/21 2341     07/11/21 2345   azithromycin (ZITHROMAX) 500 mg in sodium chloride 0.9 % 250 mL IVPB  Status:  Discontinued        500 mg 250 mL/hr over 60 Minutes Intravenous Every 24 hours 07/11/21 2341 07/14/21 0622   07/11/21 2300  ceFEPIme (MAXIPIME) 2 g in sodium chloride 0.9 % 100 mL IVPB        2 g 200 mL/hr over 30 Minutes Intravenous  Once 07/11/21 2252 07/12/21 0010   07/11/21 2300  vancomycin (VANCOREADY) IVPB 2000 mg/400 mL        2,000 mg 200 mL/hr over 120 Minutes Intravenous  Once 07/11/21 2252 07/12/21 0329   07/11/21 2300  metroNIDAZOLE (FLAGYL) IVPB 500 mg        500 mg 100 mL/hr over 60 Minutes Intravenous  Once 07/11/21 2252 07/12/21 0021       REVIEW OF SYSTEMS:  Const:  fever,  chills, negative weight loss Eyes: negative diplopia or visual changes, negative eye pain ENT: negative coryza, negative sore throat Resp: negative cough, hemoptysis, dyspnea Cards: negative for chest pain, palpitations, lower extremity edema GU: negative for frequency, dysuria and hematuria GI: rt flank pain Skin: negative for rash and pruritus Heme: negative for easy bruising and gum/nose bleeding MS: negative for myalgias, arthralgias, back pain and muscle weakness Neurolo:confusion Psych: negative for feelings of anxiety, depression  Endocrine: negative for thyroid, diabetes Allergy/Immunology- negative for any medication or food allergies ? Pertinent Positives include : Objective:  VITALS:  BP 127/81 (BP Location: Left Arm)    Pulse (!) 58    Temp 98.3 F (36.8 C) (Oral)    Resp 17    Ht 5\' 11"  (1.803 m)    Wt 106.7 kg    SpO2 96%    BMI 32.81 kg/m  PHYSICAL EXAM:  General: Alert, cooperative, no distress, appears stated age.  Head: Normocephalic, without obvious abnormality, atraumatic. Eyes: Conjunctivae clear, anicteric sclerae. Pupils are equal ENT Nares normal. No drainage or sinus tenderness. Lips, mucosa, and tongue normal. No Thrush Neck: Supple, symmetrical, no adenopathy, thyroid: non  tender no carotid bruit and no JVD. Back: No CVA tenderness. Lungs: Clear to auscultation bilaterally. No Wheezing or Rhonchi. No rales. Heart: Regular rate and rhythm, no murmur, rub or gallop. Abdomen: Soft, non-tender,not distended. Bowel sounds normal. No masses Extremities: atraumatic, no cyanosis. No edema. No clubbing Skin: No rashes or lesions. Or bruising Lymph: Cervical, supraclavicular normal. Neurologic: Grossly non-focal Pertinent Labs Lab Results CBC    Component Value Date/Time   WBC 9.9 07/16/2021 0401   RBC 3.70 (L) 07/16/2021 0401   HGB 12.2 (L) 07/16/2021 0401   HGB 14.5 04/23/2021 1503   HCT 35.0 (L) 07/16/2021 0401   HCT 42.0 04/23/2021 1503   PLT 143 (L) 07/16/2021 0401   PLT 190 04/23/2021 1503   MCV 94.6 07/16/2021 0401   MCV 97 04/23/2021 1503   MCH 33.0 07/16/2021 0401   MCHC 34.9 07/16/2021 0401   RDW 13.6 07/16/2021 0401   RDW 12.3 04/23/2021 1503   LYMPHSABS 0.4 (L) 07/11/2021 2147   MONOABS 0.3 07/11/2021 2147   EOSABS 0.0 07/11/2021 2147   BASOSABS 0.0 07/11/2021 2147    CMP Latest Ref Rng & Units 07/16/2021 07/15/2021 07/14/2021  Glucose 70 - 99 mg/dL 121(H) 132(H) 155(H)  BUN 8 - 23 mg/dL 24(H) 26(H) 31(H)  Creatinine 0.61 - 1.24 mg/dL 0.97 0.92 1.06  Sodium 135 - 145  mmol/L 137 140 138  Potassium 3.5 - 5.1 mmol/L 3.7 3.7 3.3(L)  Chloride 98 - 111 mmol/L 109 109 108  CO2 22 - 32 mmol/L 21(L) 23 23  Calcium 8.9 - 10.3 mg/dL 8.0(L) 8.2(L) 8.3(L)  Total Protein 6.5 - 8.1 g/dL - - -  Total Bilirubin 0.3 - 1.2 mg/dL - - -  Alkaline Phos 38 - 126 U/L - - -  AST 15 - 41 U/L - - -  ALT 0 - 44 U/L - - -      Microbiology: Recent Results (from the past 240 hour(s))  Resp Panel by RT-PCR (Flu A&B, Covid) Nasopharyngeal Swab     Status: None   Collection Time: 07/11/21  8:03 PM   Specimen: Nasopharyngeal Swab; Nasopharyngeal(NP) swabs in vial transport medium  Result Value Ref Range Status   SARS Coronavirus 2 by RT PCR NEGATIVE NEGATIVE  Final    Comment: (NOTE) SARS-CoV-2 target nucleic acids are NOT DETECTED.  The SARS-CoV-2 RNA is generally detectable in upper respiratory specimens during the acute phase of infection. The lowest concentration of SARS-CoV-2 viral copies this assay can detect is 138 copies/mL. A negative result does not preclude SARS-Cov-2 infection and should not be used as the sole basis for treatment or other patient management decisions. A negative result may occur with  improper specimen collection/handling, submission of specimen other than nasopharyngeal swab, presence of viral mutation(s) within the areas targeted by this assay, and inadequate number of viral copies(<138 copies/mL). A negative result must be combined with clinical observations, patient history, and epidemiological information. The expected result is Negative.  Fact Sheet for Patients:  EntrepreneurPulse.com.au  Fact Sheet for Healthcare Providers:  IncredibleEmployment.be  This test is no t yet approved or cleared by the [place] FDA and  has been authorized for detection and/or diagnosis of SARS-CoV-2 by FDA under an Emergency Use Authorization (EUA). This EUA will remain  in effect (meaning this test can be used) for the duration of the COVID-19 declaration under Section 564(b)(1) of the Act, 21 U.S.C.section 360bbb-3(b)(1), unless the authorization is terminated  or revoked sooner.       Influenza A by PCR NEGATIVE NEGATIVE Final   Influenza B by PCR NEGATIVE NEGATIVE Final    Comment: (NOTE) The Xpert Xpress SARS-CoV-2/FLU/RSV plus assay is intended as an aid in the diagnosis of influenza from Nasopharyngeal swab specimens and should not be used as a sole basis for treatment. Nasal washings and aspirates are unacceptable for Xpert Xpress SARS-CoV-2/FLU/RSV testing.  Fact Sheet for Patients: EntrepreneurPulse.com.au  Fact Sheet for Healthcare  Providers: IncredibleEmployment.be  This test is not yet approved or cleared by the [place] FDA and has been authorized for detection and/or diagnosis of SARS-CoV-2 by FDA under an Emergency Use Authorization (EUA). This EUA will remain in effect (meaning this test can be used) for the duration of the COVID-19 declaration under Section 564(b)(1) of the Act, 21 U.S.C. section 360bbb-3(b)(1), unless the authorization is terminated or revoked.  Performed at [facility], [address] [place], [place] [address]   Blood culture (routine x 2)     Status: Abnormal   Collection Time: 07/11/21  9:47 PM   Specimen: BLOOD  Result Value Ref Range Status   Specimen Description   Final    BLOOD LEFT HAND Performed at [facility], [address] [place], [place] [address]    Special Requests   Final    BOTTLES DRAWN AEROBIC AND ANAEROBIC Blood Culture  adequate volume Performed at [facility], [place], [place] [address]    Culture  Setup Time   Final    GRAM POSITIVE COCCI ANAEROBIC BOTTLE ONLY CRITICAL RESULT CALLED TO, READ BACK BY AND VERIFIED WITH: BRANDON BEERS AT 1332 07/13/21.PMF Performed at [facility], [address], [place], [place] [address]    Culture STAPHYLOCOCCUS EPIDERMIDIS (A)  Final   Report Status 07/16/2021 FINAL  Final   Organism ID, Bacteria STAPHYLOCOCCUS EPIDERMIDIS  Final      Susceptibility   Staphylococcus epidermidis - MIC*    CIPROFLOXACIN 4 RESISTANT Resistant     ERYTHROMYCIN <=0.25 SENSITIVE Sensitive     GENTAMICIN <=0.5 SENSITIVE Sensitive     OXACILLIN <=0.25 SENSITIVE Sensitive     TETRACYCLINE 2 SENSITIVE Sensitive     VANCOMYCIN <=0.5 SENSITIVE Sensitive     TRIMETH/SULFA 80 RESISTANT Resistant     CLINDAMYCIN <=0.25 SENSITIVE Sensitive     RIFAMPIN <=0.5 SENSITIVE Sensitive     Inducible Clindamycin NEGATIVE Sensitive     * STAPHYLOCOCCUS EPIDERMIDIS  Blood  culture (routine x 2)     Status: Abnormal   Collection Time: 07/11/21  9:47 PM   Specimen: BLOOD  Result Value Ref Range Status   Specimen Description   Final    BLOOD RIGHT ASSIST CONTROL Performed at [facility], [address], [place], [place] [address]    Special Requests   Final    BOTTLES DRAWN AEROBIC AND ANAEROBIC Blood Culture results may not be optimal due to an excessive volume of blood received in culture bottles Performed at [facility], [address] [place], [place] [address]    Culture  Setup Time   Final    GRAM POSITIVE COCCI IN BOTH AEROBIC AND ANAEROBIC BOTTLES CRITICAL RESULT CALLED TO, READ BACK BY AND VERIFIED WITH: PHARMD NATHAN BELUE AT 2300 07/13/2021 GA Performed at [facility], [address] [place], [place] [address]    Culture (A)  Final    STAPHYLOCOCCUS EPIDERMIDIS SUSCEPTIBILITIES PERFORMED ON PREVIOUS CULTURE WITHIN THE LAST 5 DAYS. Performed at [facility], [place] [address] [place],  [address]    Report Status 07/16/2021 FINAL  Final  Blood Culture ID Panel (Reflexed)     Status: Abnormal   Collection Time: 07/11/21  9:47 PM  Result Value Ref Range Status   Enterococcus faecalis NOT DETECTED NOT DETECTED Final   Enterococcus Faecium NOT DETECTED NOT DETECTED Final   Listeria monocytogenes NOT DETECTED NOT DETECTED Final   Staphylococcus species DETECTED (A) NOT DETECTED Final    Comment: CRITICAL RESULT CALLED TO, READ BACK BY AND VERIFIED WITH: BRANDON BEERS AT 1332 07/13/21.PMF    Staphylococcus aureus (BCID) NOT DETECTED NOT DETECTED Final   Staphylococcus epidermidis DETECTED (A) NOT DETECTED Final    Comment: CRITICAL RESULT CALLED TO, READ BACK BY AND VERIFIED WITH: BRANDON BEERS AT 1332 07/13/21.PMF    Staphylococcus lugdunensis NOT DETECTED NOT DETECTED Final   Streptococcus species NOT DETECTED NOT DETECTED Final   Streptococcus agalactiae NOT DETECTED NOT DETECTED Final    Streptococcus pneumoniae NOT DETECTED NOT DETECTED Final   Streptococcus pyogenes NOT DETECTED NOT DETECTED Final   A.calcoaceticus-baumannii NOT DETECTED NOT DETECTED Final   Bacteroides fragilis NOT DETECTED NOT DETECTED Final   Enterobacterales NOT DETECTED NOT DETECTED Final   Enterobacter cloacae complex NOT DETECTED NOT DETECTED Final   Escherichia coli NOT DETECTED NOT DETECTED Final   Klebsiella aerogenes NOT DETECTED  NOT DETECTED Final   Klebsiella oxytoca NOT DETECTED NOT DETECTED Final   Klebsiella pneumoniae NOT DETECTED NOT DETECTED Final   Proteus species NOT DETECTED NOT DETECTED Final   Salmonella species NOT DETECTED NOT DETECTED Final   Serratia marcescens NOT DETECTED NOT DETECTED Final   Haemophilus influenzae NOT DETECTED NOT DETECTED Final   Neisseria meningitidis NOT DETECTED NOT DETECTED Final   Pseudomonas aeruginosa NOT DETECTED NOT DETECTED Final   Stenotrophomonas maltophilia NOT DETECTED NOT DETECTED Final   Candida albicans NOT DETECTED NOT DETECTED Final   Candida auris NOT DETECTED NOT DETECTED Final   Candida glabrata NOT DETECTED NOT DETECTED Final   Candida krusei NOT DETECTED NOT DETECTED Final   Candida parapsilosis NOT DETECTED NOT DETECTED Final   Candida tropicalis NOT DETECTED NOT DETECTED Final   Cryptococcus neoformans/gattii NOT DETECTED NOT DETECTED Final   Methicillin resistance mecA/C NOT DETECTED NOT DETECTED Final    Comment: Performed at [facility], [address] [place], [place] [address]  Urine Culture     Status: None   Collection Time: 07/11/21 11:08 PM   Specimen: Urine, Random  Result Value Ref Range Status   Specimen Description   Final    URINE, RANDOM Performed at [facility], [address], [place], [place] [address]    Special Requests   Final    NONE Performed at [facility], [address] [place], [place] [address]    Culture   Final    NO GROWTH Performed at [facility], [address], [place], [place] [address]    Report Status 07/12/2021 FINAL  Final  MRSA Next Gen by PCR, Nasal     Status: None   Collection Time: 07/12/21  2:56 AM   Specimen: Nasal Mucosa; Nasal Swab  Result Value Ref Range Status   MRSA by PCR Next Gen NOT DETECTED NOT DETECTED Final    Comment: (NOTE) The GeneXpert MRSA Assay (FDA approved for NASAL specimens only), is one component of a comprehensive MRSA colonization surveillance program. It is not intended to diagnose MRSA infection nor to guide or monitor treatment for MRSA infections. Test performance is not FDA approved in patients less than 38 years old. Performed at [facility], [address] [place], [place] [address]   CULTURE, BLOOD (ROUTINE X 2) w Reflex to ID Panel     Status: None (Preliminary result)   Collection Time: 07/14/21  5:25 AM   Specimen: BLOOD  Result Value Ref Range Status   Specimen Description BLOOD BLOOD LEFT HAND  Final   Special Requests   Final    BOTTLES DRAWN AEROBIC AND ANAEROBIC Blood Culture adequate volume   Culture   Final    NO GROWTH 2 DAYS Performed at [facility], [address] [place], [place] [address]    Report Status PENDING  Incomplete  CULTURE, BLOOD (ROUTINE X 2) w Reflex to ID Panel     Status: None (Preliminary result)   Collection Time: 07/14/21  5:30 AM   Specimen: BLOOD  Result Value Ref Range Status   Specimen Description BLOOD BLOOD RIGHT HAND  Final   Special Requests   Final    BOTTLES DRAWN AEROBIC AND ANAEROBIC Blood Culture adequate volume   Culture   Final    NO GROWTH 2 DAYS Performed at [facility], [address], [place], [place] [address]    Report Status PENDING  Incomplete    IMAGING RESULTS:  I have personally reviewed the films ?10 mm proximal right ureteral stone described above is obstructing, causing severe right hydronephrosis with perinephric  stranding.  Impression/Recommendation ? Admitted with sepsis , rt hydronephrosis due to ? Pt was on levaquin before hospitalization hence culture neg S/p rt ureteral stent placement Currently on ceftriaxone- will do US renal to see for resolution of rt hydronephrosis  Staph epidermidis in blood culture very likely contaminant, but ceftriaxone will treat this as well No need for vanco   New onset Afi with RVR- now in sinus rhythm on amiodarone  Ischemic  cardiomyopathy with ef of 35% Cad s/p LAD stent ? ___________________________________________________ Discussed with patient, requesting provider Note:  This document was prepared using Dragon voice recognition software and may include unintentional dictation errors.

## 2021-07-16 NOTE — Progress Notes (Signed)
PROGRESS NOTE    [name] Walker  [id number] DOB: 06-02-1956 DOA: 07/11/2021 PCP: Tracie Harrier, MD   Brief Narrative: 66 year old with past medical history significant for CAD, history of anterior MI in 2013 secondary to occluded proximal LAD that was treated with PCI stents, ischemic cardiomyopathy with ejection fraction [id number], chronic systolic heart failure, ongoing tobacco abuse, hypertension, OSA, chronic kidney stones presented with altered mental status.  Per family patient developed fevers and associated right flank pain the day prior to admission.  He started to have nausea vomiting 2 days prior to admission, also hematuria.  Patient was admitted by critical care team for sepsis secondary to obstructive uropathy, UTI, systolic blood pressure in the 60.  AKI with a creatinine of 3.5.  CT chest abdomen and pelvis showed obstructing 10 mm proximal right ureteral stone with severe right hydronephrosis and perinephric stranding.  Patient was a started on IV fluids, broad-spectrum IV antibiotic, Levophed.  He underwent cystoscopy with right stent placement on 1/12 by Dr. Yves Dill.  His care was transferred to [facility] 1/13.  He develops A fib with RVR with low blood pressure.  Cardiology was consulted, patient was started on IV amiodarone, and heparin drip.   Assessment & Plan:   Principal Problem:   Sepsis with hypotension ([place]) Active Problems:   Tobacco abuse   Chronic systolic heart failure (HCC)   Dyslipidemia   Kidney stone on right side   AKI (acute kidney injury) ([place])   Acute unilateral obstructive uropathy   Atrial fibrillation (HCC)   HFrEF (heart failure with reduced ejection fraction) ([place])   1-Sepsis with Septic shock secondary to acute obstructive pyelonephritis and possible underlying pneumonia: -Patient admitted to the ICU, he received IV fluids, broad-spectrum antibiotics and IV pressors -He underwent cystoscopy with right stent placement on 1/12 by Dr.  Eliberto Ivory. -Continue with IV ceftriaxone.  -Urine culture no growth to date.  -On IV stress dose steroids.  -Started on Midodrine. Hold if SBP above 120 -Blood culture: 2 anaerobic Blood culture positive,  stap Epi, final report pending. Discussed with ID, started on IV Vancomycin and stop doxy. Continue ceftriaxone.  I have repeated Blood culture 1/14. So far negative.  -ID to follow on patient for final recommendation in regards antibiotics.   2-AKI secondary to acute obstructing uropathy secondary to right ureteral lithiasis -Patient underwent cystoscopy and right ureteral stent placement 1/12 -Renal function significantly improved, creatinine peak to 2.5, it has decreased to 1.2. -He received IV fluids. -He will need to follow-up with Dr. Yves Dill  for lithotripsy and ureteral stent removal. Discussed with Dr Yves Dill he will arrange procedure out patient, when patient has recover from recent hospitalization.   3-A fib RVR, with Hypotension;  -He was on Coreg at home, unable to do beta-blockers due to hypotension -He was a started on IV amiodarone drip. Now on oral amiodarone.  -He received IV heparin.  -Cardiology consulted and following.  -Patient will need cardiac monitor at discharge.  Cardiology recommend to discontinue heparin, no need for oral anticoagulation. A fib in setting of Sepsis.   4--Hypokalemia: Replaced.  5--Hypomagnesemia: Replaced.   6-Acute on chronic hypoxic respiratory failure secondary to possible pneumonia, AECOPD  -Past medical history COPD, current tobacco abuse, OSA: Continue with  oxygen supplementation. Continue with incentive spirometry. Continue with nebulizer, Pulmicort.  On IV antibiotics for PNA> ceftriaxone.   7-Chronic systolic heart failure, last known ejection fraction 35% History of hypertension, CAD anterior MI 2013 s/p PCI to LAD.  Ischemic cardiomyopathy,  HLD -BP improved. Carvedilol and Entresto resume by cardiology.   8-Acute metabolic  encephalopathy due to sepsis: Improved.   Low TSH. Free T 3 and T 4 Normal.  TSH0.072 Needs repeat TSH in 4 weeks.   Estimated body mass index is 32.81 kg/m as calculated from the following:   Height as of this encounter: 5\' 11"  (1.803 m).   Weight as of this encounter: 106.7 kg.   DVT prophylaxis: Heparin gtt Code Status: Full Code Family Communication: care discussed with patient.  Disposition Plan:  Status is: Inpatient  Remains inpatient appropriate because: Awaiting Final Blood culture results from 1/11/ Awaiting recommendation from ID> for antibiotics.         Consultants:  CCM admitted patient.  Cardiology   Procedures:  Cystoscopy, ureteral stent placement.   Antimicrobials:  Ceftriaxone  Zithromax  Subjective: He is feeling better., no new complaints   Objective: Vitals:   07/16/21 1018 07/16/21 1100 07/16/21 1419 07/16/21 1500  BP: 127/81 102/68 122/75 110/74  Pulse: (!) 58 62 62 (!) 58  Resp: 17 13 20 15   Temp:      TempSrc:      SpO2: 96% 93% 96% 92%  Weight:      Height:        Intake/Output Summary (Last 24 hours) at 07/16/2021 1548 Last data filed at 07/16/2021 1000 Gross per 24 hour  Intake 1497.94 ml  Output 950 ml  Net 547.94 ml    Filed Weights   07/14/21 0456 07/15/21 0500 07/16/21 0500  Weight: 99.8 kg 107.9 kg 106.7 kg    Examination:  General exam: NAD Respiratory system: CTA Cardiovascular system: S ,1 S 2 RRR Gastrointestinal system: BS present, soft, nt Central nervous system: Alert, follows command Extremities: No edema   Data Reviewed: I have personally reviewed following labs and imaging studies  CBC: Recent Labs  Lab 07/11/21 2147 07/12/21 0422 07/14/21 0450 07/15/21 0451 07/16/21 0401  WBC 3.9* 6.7 9.7 10.2 9.9  NEUTROABS 3.1  --   --   --   --   HGB 13.5 12.6* 12.5* 12.1* 12.2*  HCT 38.0* 35.4* 35.2* 34.9* 35.0*  MCV 96.7 95.9 96.7 95.4 94.6  PLT 105* 96* 107* 123* 143*    Basic Metabolic  Panel: Recent Labs  Lab 07/12/21 0422 07/13/21 0338 07/14/21 0450 07/14/21 0539 07/15/21 0451 07/16/21 0401  NA 135 138 138  --  140 137  K 3.4* 3.1* 3.3*  --  3.7 3.7  CL 104 107 108  --  109 109  CO2 19* 24 23  --  23 21*  GLUCOSE 127* 142* 155*  --  132* 121*  BUN 49* 34* 31*  --  26* 24*  CREATININE 2.76* 1.23 1.06  --  0.92 0.97  CALCIUM 8.2* 8.5* 8.3*  --  8.2* 8.0*  MG 1.5* 2.1  --  2.0 1.6* 1.9  PHOS 3.7  --   --   --   --  4.5    GFR: Estimated Creatinine Clearance: 95.7 mL/min (by C-G formula based on SCr of 0.97 mg/dL). Liver Function Tests: Recent Labs  Lab 07/11/21 2147  AST 19  ALT 15  ALKPHOS 53  BILITOT 1.1  PROT 6.4*  ALBUMIN 3.2*    No results for input(s): LIPASE, AMYLASE in the last 168 hours. No results for input(s): AMMONIA in the last 168 hours. Coagulation Profile: Recent Labs  Lab 07/13/21 0927  INR 1.1    Cardiac Enzymes: No results for input(s):  CKTOTAL, CKMB, CKMBINDEX, TROPONINI in the last 168 hours. BNP (last 3 results) No results for input(s): PROBNP in the last 8760 hours. HbA1C: No results for input(s): HGBA1C in the last 72 hours. CBG: Recent Labs  Lab 07/12/21 0254  GLUCAP 127*    Lipid Profile: No results for input(s): CHOL, HDL, LDLCALC, TRIG, CHOLHDL, LDLDIRECT in the last 72 hours. Thyroid Function Tests: Recent Labs    07/15/21 0451  TSH 0.361*  T4TOTAL 4.5    Anemia Panel: No results for input(s): VITAMINB12, FOLATE, FERRITIN, TIBC, IRON, RETICCTPCT in the last 72 hours. Sepsis Labs: Recent Labs  Lab 07/11/21 2147 07/12/21 0022 07/12/21 0028  PROCALCITON  --  30.36  --   LATICACIDVEN 1.6  --  1.3     Recent Results (from the past 240 hour(s))  Resp Panel by RT-PCR (Flu A&B, Covid) Nasopharyngeal Swab     Status: None   Collection Time: 07/11/21  8:03 PM   Specimen: Nasopharyngeal Swab; Nasopharyngeal(NP) swabs in vial transport medium  Result Value Ref Range Status   SARS Coronavirus 2 by RT  PCR NEGATIVE NEGATIVE Final    Comment: (NOTE) SARS-CoV-2 target nucleic acids are NOT DETECTED.  The SARS-CoV-2 RNA is generally detectable in upper respiratory specimens during the acute phase of infection. The lowest concentration of SARS-CoV-2 viral copies this assay can detect is 138 copies/mL. A negative result does not preclude SARS-Cov-2 infection and should not be used as the sole basis for treatment or other patient management decisions. A negative result may occur with  improper specimen collection/handling, submission of specimen other than nasopharyngeal swab, presence of viral mutation(s) within the areas targeted by this assay, and inadequate number of viral copies(<138 copies/mL). A negative result must be combined with clinical observations, patient history, and epidemiological information. The expected result is Negative.  Fact Sheet for Patients:  EntrepreneurPulse.com.au  Fact Sheet for Healthcare Providers:  IncredibleEmployment.be  This test is no t yet approved or cleared by the [place] FDA and  has been authorized for detection and/or diagnosis of SARS-CoV-2 by FDA under an Emergency Use Authorization (EUA). This EUA will remain  in effect (meaning this test can be used) for the duration of the COVID-19 declaration under Section 564(b)(1) of the Act, 21 U.S.C.section 360bbb-3(b)(1), unless the authorization is terminated  or revoked sooner.       Influenza A by PCR NEGATIVE NEGATIVE Final   Influenza B by PCR NEGATIVE NEGATIVE Final    Comment: (NOTE) The Xpert Xpress SARS-CoV-2/FLU/RSV plus assay is intended as an aid in the diagnosis of influenza from Nasopharyngeal swab specimens and should not be used as a sole basis for treatment. Nasal washings and aspirates are unacceptable for Xpert Xpress SARS-CoV-2/FLU/RSV testing.  Fact Sheet for Patients: EntrepreneurPulse.com.au  Fact Sheet for  Healthcare Providers: IncredibleEmployment.be  This test is not yet approved or cleared by the [place] FDA and has been authorized for detection and/or diagnosis of SARS-CoV-2 by FDA under an Emergency Use Authorization (EUA). This EUA will remain in effect (meaning this test can be used) for the duration of the COVID-19 declaration under Section 564(b)(1) of the Act, 21 U.S.C. section 360bbb-3(b)(1), unless the authorization is terminated or revoked.  Performed at [facility], [address] [place], [place] [address]   Blood culture (routine x 2)     Status: Abnormal   Collection Time: 07/11/21  9:47 PM   Specimen: BLOOD  Result Value Ref Range Status   Specimen Description  Final    BLOOD LEFT HAND Performed at [facility], [address] [place], [place] [address]    Special Requests   Final    BOTTLES DRAWN AEROBIC AND ANAEROBIC Blood Culture adequate volume Performed at [address] [place], [place] [address]    Culture  Setup Time   Final    GRAM POSITIVE COCCI ANAEROBIC BOTTLE ONLY CRITICAL RESULT CALLED TO, READ BACK BY AND VERIFIED WITH: BRANDON BEERS AT 1332 07/13/21.PMF Performed at [facility], [place] [address], [place], [place] [address]    Culture STAPHYLOCOCCUS EPIDERMIDIS (A)  Final   Report Status 07/16/2021 FINAL  Final   Organism ID, Bacteria STAPHYLOCOCCUS EPIDERMIDIS  Final      Susceptibility   Staphylococcus epidermidis - MIC*    CIPROFLOXACIN 4 RESISTANT Resistant     ERYTHROMYCIN <=0.25 SENSITIVE Sensitive     GENTAMICIN <=0.5 SENSITIVE Sensitive     OXACILLIN <=0.25 SENSITIVE Sensitive     TETRACYCLINE 2 SENSITIVE Sensitive     VANCOMYCIN <=0.5 SENSITIVE Sensitive     TRIMETH/SULFA 80 RESISTANT Resistant     CLINDAMYCIN <=0.25 SENSITIVE Sensitive     RIFAMPIN <=0.5 SENSITIVE Sensitive     Inducible Clindamycin NEGATIVE Sensitive     * STAPHYLOCOCCUS EPIDERMIDIS   Blood culture (routine x 2)     Status: Abnormal   Collection Time: 07/11/21  9:47 PM   Specimen: BLOOD  Result Value Ref Range Status   Specimen Description   Final    BLOOD RIGHT ASSIST CONTROL Performed at [facility], [address] [place], [place] [address]    Special Requests   Final    BOTTLES DRAWN AEROBIC AND ANAEROBIC Blood Culture results may not be optimal due to an excessive volume of blood received in culture bottles Performed at [facility], [address] [place], [place] [address]    Culture  Setup Time   Final    GRAM POSITIVE COCCI IN BOTH AEROBIC AND ANAEROBIC BOTTLES CRITICAL RESULT CALLED TO, READ BACK BY AND VERIFIED WITH: PHARMD NATHAN BELUE AT 2300 07/13/2021 GA Performed at [facility], [address] [place], [place] [address]    Culture (A)  Final    STAPHYLOCOCCUS EPIDERMIDIS SUSCEPTIBILITIES PERFORMED ON PREVIOUS CULTURE WITHIN THE LAST 5 DAYS. Performed at [facility], [place] [address] [place], [place] [address]    Report Status 07/16/2021 FINAL  Final  Blood Culture ID Panel (Reflexed)     Status: Abnormal   Collection Time: 07/11/21  9:47 PM  Result Value Ref Range Status   Enterococcus faecalis NOT DETECTED NOT DETECTED Final   Enterococcus Faecium NOT DETECTED NOT DETECTED Final   Listeria monocytogenes NOT DETECTED NOT DETECTED Final   Staphylococcus species DETECTED (A) NOT DETECTED Final    Comment: CRITICAL RESULT CALLED TO, READ BACK BY AND VERIFIED WITH: BRANDON BEERS AT 1332 07/13/21.PMF    Staphylococcus aureus (BCID) NOT DETECTED NOT DETECTED Final   Staphylococcus epidermidis DETECTED (A) NOT DETECTED Final    Comment: CRITICAL RESULT CALLED TO, READ BACK BY AND VERIFIED WITH: BRANDON BEERS AT 1332 07/13/21.PMF    Staphylococcus lugdunensis NOT DETECTED NOT DETECTED Final   Streptococcus species NOT DETECTED NOT DETECTED Final   Streptococcus agalactiae NOT DETECTED NOT DETECTED Final    Streptococcus pneumoniae NOT DETECTED NOT DETECTED Final   Streptococcus pyogenes NOT DETECTED NOT DETECTED Final   A.calcoaceticus-baumannii NOT DETECTED NOT DETECTED Final   Bacteroides fragilis NOT  DETECTED NOT DETECTED Final   Enterobacterales NOT DETECTED NOT DETECTED Final   Enterobacter cloacae complex NOT DETECTED NOT DETECTED Final   Escherichia coli NOT DETECTED NOT DETECTED Final   Klebsiella aerogenes NOT DETECTED NOT DETECTED Final   Klebsiella oxytoca NOT DETECTED NOT DETECTED Final   Klebsiella pneumoniae NOT DETECTED NOT DETECTED Final   Proteus species NOT DETECTED NOT DETECTED Final   Salmonella species NOT DETECTED NOT DETECTED Final   Serratia marcescens NOT DETECTED NOT DETECTED Final   Haemophilus influenzae NOT DETECTED NOT DETECTED Final   Neisseria meningitidis NOT DETECTED NOT DETECTED Final   Pseudomonas aeruginosa NOT DETECTED NOT DETECTED Final   Stenotrophomonas maltophilia NOT DETECTED NOT DETECTED Final   Candida albicans NOT DETECTED NOT DETECTED Final   Candida auris NOT DETECTED NOT DETECTED Final   Candida glabrata NOT DETECTED NOT DETECTED Final   Candida krusei NOT DETECTED NOT DETECTED Final   Candida parapsilosis NOT DETECTED NOT DETECTED Final   Candida tropicalis NOT DETECTED NOT DETECTED Final   Cryptococcus neoformans/gattii NOT DETECTED NOT DETECTED Final   Methicillin resistance mecA/C NOT DETECTED NOT DETECTED Final    Comment: Performed at [facility], [address], [place], [place] [address]  Urine Culture     Status: None   Collection Time: 07/11/21 11:08 PM   Specimen: Urine, Random  Result Value Ref Range Status   Specimen Description   Final    URINE, RANDOM Performed at [facility], [address] [place], [place] [address]    Special Requests   Final    NONE Performed at [facility], [address] [place], [place] [address]    Culture   Final    NO GROWTH Performed at [facility], [address], [place], [place] [address]    Report Status 07/12/2021 FINAL  Final  MRSA Next Gen by PCR, Nasal     Status: None   Collection Time: 07/12/21  2:56 AM   Specimen: Nasal Mucosa; Nasal Swab  Result Value Ref Range Status   MRSA by PCR Next Gen NOT DETECTED NOT DETECTED Final    Comment: (NOTE) The GeneXpert MRSA Assay (FDA approved for NASAL specimens only), is one component of a comprehensive MRSA colonization surveillance program. It is not intended to diagnose MRSA infection nor to guide or monitor treatment for MRSA infections. Test performance is not FDA approved in patients less than 53 years old. Performed at [facility], [address] [place], [place] [address]   CULTURE, BLOOD (ROUTINE X 2) w Reflex to ID Panel     Status: None (Preliminary result)   Collection Time: 07/14/21  5:25 AM   Specimen: BLOOD  Result Value Ref Range Status   Specimen Description BLOOD BLOOD LEFT HAND  Final   Special Requests   Final    BOTTLES DRAWN AEROBIC AND ANAEROBIC Blood Culture adequate volume   Culture   Final    NO GROWTH 2 DAYS Performed at [facility], [address], [place],  [address]    Report Status PENDING  Incomplete  CULTURE, BLOOD (ROUTINE X 2) w Reflex to ID Panel     Status: None (Preliminary result)   Collection Time: 07/14/21  5:30 AM   Specimen: BLOOD  Result Value Ref Range Status   Specimen Description BLOOD BLOOD RIGHT HAND  Final   Special Requests   Final    BOTTLES DRAWN AEROBIC AND ANAEROBIC Blood Culture adequate volume  Culture   Final    NO GROWTH 2 DAYS Performed at [facility], [address] [place], [place] [address]    Report Status PENDING  Incomplete          Radiology Studies: No results found.      Scheduled Meds:  amiodarone  200 mg Oral BID   aspirin EC  81 mg Oral Daily   atorvastatin  20 mg Oral QHS   budesonide (PULMICORT) nebulizer solution  0.25 mg  Nebulization BID   carvedilol  3.125 mg Oral BID WC   Chlorhexidine Gluconate Cloth  6 each Topical Q0600   dapagliflozin propanediol  5 mg Oral Daily   [START ON 07/17/2021] enoxaparin (LOVENOX) injection  40 mg Subcutaneous Q24H   hydrocortisone sod succinate (SOLU-CORTEF) inj  50 mg Intravenous Q12H   ipratropium-albuterol  3 mL Nebulization BID   sacubitril-valsartan  1 tablet Oral BID   Continuous Infusions:  cefTRIAXone (ROCEPHIN)  IV Stopped (07/16/21 0911)     LOS: 5 days    Time spent: 35 minutes.,     Elmarie Shiley, MD [facility]   If 7PM-7AM, please contact night-coverage [email or web address]  07/16/2021, 3:48 PM

## 2021-07-16 NOTE — Progress Notes (Signed)
°   07/16/21 1000  Clinical Encounter Type  Visited With Patient  Visit Type Initial;Social support  Referral From Nurse   Chaplain began a visit with patient but was interrupted due to a phone call that patient had to take.

## 2021-07-16 NOTE — Progress Notes (Signed)
Progress Note  Patient Name: [name] Walker Date of Encounter: 07/16/2021  [facility] Cardiologist: Sinclair Grooms, MD   Subjective   He is feeling well.  Denies chest pain or shortness of breath.  He is maintaining in sinus rhythm.  Inpatient Medications    Scheduled Meds:  amiodarone  400 mg Oral BID   aspirin EC  81 mg Oral Daily   atorvastatin  20 mg Oral QHS   budesonide (PULMICORT) nebulizer solution  0.25 mg Nebulization BID   carvedilol  3.125 mg Oral BID WC   Chlorhexidine Gluconate Cloth  6 each Topical Q0600   dapagliflozin propanediol  5 mg Oral Daily   hydrocortisone sod succinate (SOLU-CORTEF) inj  50 mg Intravenous Q12H   ipratropium-albuterol  3 mL Nebulization TID   sacubitril-valsartan  1 tablet Oral BID   Continuous Infusions:  cefTRIAXone (ROCEPHIN)  IV 2 g (07/16/21 0841)   heparin 1,900 Units/hr (07/16/21 0847)   vancomycin Stopped (07/16/21 0639)   PRN Meds: albuterol, docusate sodium, loperamide, polyethylene glycol   Vital Signs    Vitals:   07/16/21 0600 07/16/21 0700 07/16/21 0738 07/16/21 0800  BP: (!) 154/75 (!) 162/80  (!) 163/77  Pulse: (!) 56 60 (!) 58 (!) 57  Resp: 11 14 15 17   Temp:   98.3 F (36.8 C)   TempSrc:   Oral   SpO2: 96% 94% 95% 100%  Weight:      Height:        Intake/Output Summary (Last 24 hours) at 07/16/2021 0905 Last data filed at 07/16/2021 0738 Gross per 24 hour  Intake 1375.32 ml  Output 1325 ml  Net 50.32 ml    Last 3 Weights 07/16/2021 07/15/2021 07/14/2021  Weight (lbs) 235 lb 3.7 oz 237 lb 14 oz 220 lb 0.3 oz  Weight (kg) 106.7 kg 107.9 kg 99.8 kg      Telemetry    Sinus bradycardia heart rate 55- Personally Reviewed  ECG     - Personally Reviewed  Physical Exam   GEN: No acute distress.   Neck: No JVD Cardiac: RRR, no murmurs, rubs, or gallops.  Respiratory: Diminished breath sounds at bases, clear anteriorly GI: Soft, nontender, non-distended  MS: No edema; No deformity. Neuro:   Nonfocal  Psych: Normal affect   Labs    High Sensitivity Troponin:   Recent Labs  Lab 07/11/21 2147 07/12/21 0022  TROPONINIHS 57* 50*      Chemistry Recent Labs  Lab 07/11/21 2147 07/12/21 0422 07/14/21 0450 07/14/21 0539 07/15/21 0451 07/16/21 0401  NA 133*   < > 138  --  140 137  K 3.2*   < > 3.3*  --  3.7 3.7  CL 101   < > 108  --  109 109  CO2 19*   < > 23  --  23 21*  GLUCOSE 118*   < > 155*  --  132* 121*  BUN 54*   < > 31*  --  26* 24*  CREATININE 3.55*   < > 1.06  --  0.92 0.97  CALCIUM 8.5*   < > 8.3*  --  8.2* 8.0*  MG  --    < >  --  2.0 1.6* 1.9  PROT 6.4*  --   --   --   --   --   ALBUMIN 3.2*  --   --   --   --   --   AST 19  --   --   --   --   --  ALT 15  --   --   --   --   --   ALKPHOS 53  --   --   --   --   --   BILITOT 1.1  --   --   --   --   --   GFRNONAA 18*   < > >60  --  >60 >60  ANIONGAP 13   < > 7  --  8 7   < > = values in this interval not displayed.     Lipids No results for input(s): CHOL, TRIG, HDL, LABVLDL, LDLCALC, CHOLHDL in the last 168 hours.  Hematology Recent Labs  Lab 07/14/21 0450 07/15/21 0451 07/16/21 0401  WBC 9.7 10.2 9.9  RBC 3.64* 3.66* 3.70*  HGB 12.5* 12.1* 12.2*  HCT 35.2* 34.9* 35.0*  MCV 96.7 95.4 94.6  MCH 34.3* 33.1 33.0  MCHC 35.5 34.7 34.9  RDW 13.5 13.8 13.6  PLT 107* 123* 143*    Thyroid  Recent Labs  Lab 07/13/21 0927  TSH 0.072*     BNP Recent Labs  Lab 07/12/21 0022  BNP 223.1*     DDimer No results for input(s): DDIMER in the last 168 hours.   Radiology    No results found.  Cardiac Studies   TTE 03/2021 1. Left ventricular ejection fraction, by estimation, is 30 to 35%. The  left ventricle has moderately decreased function. The left ventricle  demonstrates regional wall motion abnormalities (see scoring  diagram/findings for description). The left  ventricular internal cavity size was mildly to moderately dilated. Left  ventricular diastolic parameters are  consistent with Grade I diastolic  dysfunction (impaired relaxation).   2. Right ventricular systolic function is normal. The right ventricular  size is mildly enlarged. Tricuspid regurgitation signal is inadequate for  assessing PA pressure.   3. The mitral valve is grossly normal. Mild mitral valve regurgitation.  No evidence of mitral stenosis.   4. The aortic valve is tricuspid. Aortic valve regurgitation is not  visualized. No aortic stenosis is present.   5. The inferior vena cava is normal in size with greater than 50%  respiratory variability, suggesting right atrial pressure of 3 mmHg.   Patient Profile     66 y.o. male with history of CAD/PCI 2013, HFrEF EF 30 to 35%, presenting with altered mental status, fever, hypotension, diagnosed with pyelonephritis and UTI induced sepsis s/p ureteral stent.  Being seen for A. fib RVR.  Assessment & Plan    1.  A. fib RVR: This was in the setting of pyelonephritis and sepsis. -Maintaining sinus rhythm on amiodarone -Planned lithotripsy later this week. -He is currently on anticoagulation with heparin.  I am going to stop heparin drip as his atrial fibrillation was relatively brief and in the setting of sepsis. -I think it is reasonable to continue amiodarone in the short-term to try to keep him in sinus rhythm.  This can likely be discontinued in the near future. Once amiodarone is discontinued, consider 2-week outpatient monitor to look for recurrent atrial fibrillation requiring anticoagulation.   2.  Cardiomyopathy EF 30 to 35% -Appears euvolemic -Blood pressure is elevated.  I resumed Entresto at the previous dose.  I resumed carvedilol at a lower dose of 3.125 mg twice daily given relative bradycardia.  3.  History of CAD/PCI -Denies chest pain -Resume Lipitor, aspirin.   4.  UTI, pyelonephritis s/p ureteral stent -Antibiotics as per primary team, urology. -Lithotripsy being planned.   5.  Preoperative cardiovascular  evaluation for lithotripsy: The patient currently with no anginal symptoms and reasonable functional capacity.  He is considered low risk for cardiovascular complications.  No need for ischemic cardiac work-up.  Total encounter time more than 35 minutes. Greater than 50% was spent in counseling and coordination of care with the patient.       Signed, Kathlyn Sacramento, MD  07/16/2021, 9:05 AM

## 2021-07-16 NOTE — Progress Notes (Signed)
ANTICOAGULATION CONSULT NOTE  Pharmacy Consult for heparin Indication: atrial fibrillation  Allergies  Allergen Reactions   Nucynta [Tapentadol] Nausea Only and Other (See Comments)    Dizziness, woozy.  Sick to his stomach   Codeine Other (See Comments)    Too drowsy    Patient Measurements: Height: 5\' 11"  (180.3 cm) Weight: 106.7 kg (235 lb 3.7 oz) IBW/kg (Calculated) : 75.3 Heparin Dosing Weight: 96 kg  Vital Signs: Temp: 98.3 F (36.8 C) (01/16 0000) Temp Source: Oral (01/16 0000) BP: 154/75 (01/16 0200) Pulse Rate: 58 (01/15 2300)  Labs: Recent Labs    07/13/21 0927 07/13/21 1620 07/14/21 0450 07/14/21 1502 07/14/21 2212 07/15/21 0451 07/16/21 0401  HGB  --    < > 12.5*  --   --  12.1* 12.2*  HCT  --   --  35.2*  --   --  34.9* 35.0*  PLT  --   --  107*  --   --  123* 143*  APTT 31  --   --   --   --   --   --   LABPROT 13.8  --   --   --   --   --   --   INR 1.1  --   --   --   --   --   --   HEPARINUNFRC  --    < > 0.24*   < > 0.48 0.53 0.69  CREATININE  --   --  1.06  --   --  0.92 0.97   < > = values in this interval not displayed.    Estimated Creatinine Clearance: 95.7 mL/min (by C-G formula based on SCr of 0.97 mg/dL).  Medical History: Past Medical History:  Diagnosis Date   Bronchitis    CHRONIC   Chronic HFrEF (heart failure with reduced ejection fraction) (HCC)    a. 03/2021 Echo: EF 30-35%, GrI DD.   Coronary artery disease    a. 05/2012 Ant STEMI/PCI: LAD 100p (thrombectomy and 3.5x20 Veriflex BMS); b. 12/2014 Cath: patent stent; c. 03/2015 Cath: LM nl, LAD 10 ISR, 31m, D1 50p, LCX 20 diff throughout, RCA nl-->Med rx.   Erectile dysfunction    History of kidney stones    HOH (hard of hearing)    Hypercholesterolemia    Hypertension    Ischemic cardiomyopathy    a. 11/2015 Echo: EF 30-35%, diff HK; b. Refused ICD; c. 03/2021 Echo: EF 30-35%, GrI DD, nl RV fxn, mild MR.   MI (myocardial infarction) (HCC) 06/19/2012   being considered for  defibilator but patient now refuses this.   Nephrolithiasis    NSTEMI (non-ST elevated myocardial infarction) (HCC) 03/2015   Patent BMS - LAD stent.   Sleep apnea    Tobacco abuse    Varicose veins      Assessment: 66 year old male with known right kidney stone originally planned for lithotripsy as outpatient. Patient presented with AMS; concern for septic shock in the setting of suspected pyelonephritis. Patient underwent cystoscopy with stent placement 1/12. AKI in the setting of above, now improved. New onset atrial fibrillation, started on an amiodarone infusion. Pharmacy consult for heparin management.  Goal of Therapy:  Heparin level 0.3-0.7 units/ml Monitor platelets by anticoagulation protocol: Yes  Date/time  Level  Comment 1/13 @ 1620   < 0.10     Subtherapeutic @1300  un/hr(resulted at 2111) 1/14 0450  0.24,           subthera,inc to  1700u/hr 1/14 1502  0.28           subthera, inc to 1900 u/hr 1/14 2212  0.48  Therap x 1 1/15 0451  0.53  Thera x2 1/16 0401  0.69  Thera x 3    Plan:  --Continue heparin infusion rate at 1900 units/hr --f/u HL w/ AM labs  --CBC daily while on heparin  Otelia Sergeant, PharmD, [facility] 07/16/2021 6:07 AM

## 2021-07-16 NOTE — Evaluation (Signed)
Occupational Therapy Evaluation Patient Details Name: [name] Walker MRN: [id number] DOB: [birth year] Today's Date: 07/16/2021   History of Present Illness 66 y.o. male with history of CAD/PCI 2013, HFrEF EF 30 to 35%, prior MI, LAD stenting 2013, HTN, HLD, and kidney stones presenting with AMS, fever, hypotension and diagnosed with pyelonephritis and UTI induced sepsis. Now s/p cystoscopy + R ureteral stent on 1/12. Also developed A. fib RVR.   Clinical Impression   Pt was seen for OT evaluation this date. Prior to hospital admission, pt was independent, working full time, and lives in a 1 story w/c accessible home with his girlfriend. Pt alert and oriented x4, independent with all aspects of ADL/mobility upon assessment, and denies complaints. VSS throughout mobility on unit. No skilled OT needs identified, will sign off.       Recommendations for follow up therapy are one component of a multi-disciplinary discharge planning process, led by the attending physician.  Recommendations may be updated based on patient status, additional functional criteria and insurance authorization.   Follow Up Recommendations  No OT follow up    Assistance Recommended at Discharge None  Patient can return home with the following      Functional Status Assessment  Patient has had a recent decline in their functional status and demonstrates the ability to make significant improvements in function in a reasonable and predictable amount of time.  Equipment Recommendations  None recommended by OT    Recommendations for Other Services       Precautions / Restrictions Precautions Precautions: None Restrictions Weight Bearing Restrictions: No      Mobility Bed Mobility Overal bed mobility: Independent                  Transfers Overall transfer level: Independent                        Balance Overall balance assessment: Independent                                          ADL either performed or assessed with clinical judgement   ADL Overall ADL's : Independent                                             Vision         Perception     Praxis      Pertinent Vitals/Pain Pain Assessment: No/denies pain     Hand Dominance Right   Extremity/Trunk Assessment Upper Extremity Assessment Upper Extremity Assessment: Overall WFL for tasks assessed   Lower Extremity Assessment Lower Extremity Assessment: Overall WFL for tasks assessed   Cervical / Trunk Assessment Cervical / Trunk Assessment: Normal   Communication Communication Communication: No difficulties   Cognition Arousal/Alertness: Awake/alert Behavior During Therapy: WFL for tasks assessed/performed Overall Cognitive Status: Within Functional Limits for tasks assessed                                       General Comments       Exercises     Shoulder Instructions      Home Living Family/patient expects to be discharged to::  Private residence Living Arrangements: Spouse/significant other Available Help at Discharge: Family;Available 24 hours/day Type of Home: House Home Access: Stairs to enter [employer] of Steps: front 2 steps, garage w/c accessible   Home Layout: One level     Bathroom Shower/Tub: Walk-in shower;Tub/shower unit   Bathroom Toilet: Standard     Home Equipment: None          Prior Functioning/Environment Prior Level of Function : Independent/Modified Independent;Working/employed;Driving                        OT Problem List:        OT Treatment/Interventions:      OT Goals(Current goals can be found in the care plan section) Acute Rehab OT Goals Patient Stated Goal: go home OT Goal Formulation: All assessment and education complete, DC therapy  OT Frequency:      Co-evaluation              AM-PAC OT "6 Clicks" Daily Activity     Outcome Measure Help from another  person eating meals?: None Help from another person taking care of personal grooming?: None Help from another person toileting, which includes using toliet, bedpan, or urinal?: None Help from another person bathing (including washing, rinsing, drying)?: None Help from another person to put on and taking off regular upper body clothing?: None Help from another person to put on and taking off regular lower body clothing?: None 6 Click Score: 24   End of Session Equipment Utilized During Treatment: Gait belt  Activity Tolerance: Patient tolerated treatment well Patient left: in bed;with call bell/phone within reach  OT Visit Diagnosis: Other symptoms and signs involving cognitive function                Time: 4315-4008 OT Time Calculation (min): 14 min Charges:  OT General Charges $OT Visit: 1 Visit OT Evaluation $OT Eval Low Complexity: 1 Low  Arman Filter., MPH, MS, OTR/L ascom [phone] 07/16/21, 10:12 AM

## 2021-07-17 ENCOUNTER — Inpatient Hospital Stay: Payer: BC Managed Care – PPO

## 2021-07-17 LAB — CBC
HCT: 37.1 % — ABNORMAL LOW (ref 39.0–52.0)
Hemoglobin: 13 g/dL (ref 13.0–17.0)
MCH: 33.9 pg (ref 26.0–34.0)
MCHC: 35 g/dL (ref 30.0–36.0)
MCV: 96.6 fL (ref 80.0–100.0)
Platelets: 169 10*3/uL (ref 150–400)
RBC: 3.84 MIL/uL — ABNORMAL LOW (ref 4.22–5.81)
RDW: 13.6 % (ref 11.5–15.5)
WBC: 10.3 10*3/uL (ref 4.0–10.5)
nRBC: 0 % (ref 0.0–0.2)

## 2021-07-17 MED ORDER — IPRATROPIUM-ALBUTEROL 0.5-2.5 (3) MG/3ML IN SOLN: 3.0000 mL | Freq: Two times a day (BID) | RESPIRATORY_TRACT | 0 refills | Status: DC

## 2021-07-17 MED ORDER — ATORVASTATIN CALCIUM 20 MG PO TABS: 20.0000 mg | ORAL_TABLET | Freq: Every day | ORAL | 0 refills | Status: DC

## 2021-07-17 MED ORDER — ASPIRIN 81 MG PO TBEC: 81.0000 mg | DELAYED_RELEASE_TABLET | Freq: Every day | ORAL | 11 refills | Status: DC

## 2021-07-17 MED ORDER — AMIODARONE HCL 200 MG PO TABS: 200.0000 mg | ORAL_TABLET | Freq: Two times a day (BID) | ORAL | 0 refills | Status: DC

## 2021-07-17 MED ORDER — ACETAMINOPHEN 325 MG PO TABS
650.0000 mg | ORAL_TABLET | Freq: Once | ORAL | Status: AC
  Administered 2021-07-17: 650 mg via ORAL
  Filled 2021-07-17: qty 2

## 2021-07-17 MED ORDER — CEPHALEXIN 500 MG PO CAPS: 500.0000 mg | ORAL_CAPSULE | Freq: Three times a day (TID) | ORAL | 0 refills | Status: AC

## 2021-07-17 MED ORDER — ALBUTEROL SULFATE HFA 108 (90 BASE) MCG/ACT IN AERS: 2.0000 | INHALATION_SPRAY | Freq: Four times a day (QID) | RESPIRATORY_TRACT | 2 refills | Status: DC | PRN

## 2021-07-17 NOTE — Progress Notes (Signed)
Patient alert and oriented x4 in NSR/SB on room air. Ambulated in room to [facility] independently, tolerated well. PIVs removed, discharged/ medication instructions reviewed with patient.

## 2021-07-17 NOTE — Progress Notes (Signed)
Progress Note  Patient Name: [name] Walker Date of Encounter: 07/17/2021  [name] Cardiologist: Cavalier, MD   Subjective   Feels well; no chest pain, shortness of breath, palpitations, lightheadedness, or edema.  Inpatient Medications    Scheduled Meds:  amiodarone  200 mg Oral BID   aspirin EC  81 mg Oral Daily   atorvastatin  20 mg Oral QHS   budesonide (PULMICORT) nebulizer solution  0.25 mg Nebulization BID   Chlorhexidine Gluconate Cloth  6 each Topical Q0600   dapagliflozin propanediol  5 mg Oral Daily   enoxaparin (LOVENOX) injection  40 mg Subcutaneous Q24H   hydrocortisone sod succinate (SOLU-CORTEF) inj  50 mg Intravenous Q12H   ipratropium-albuterol  3 mL Nebulization BID   sacubitril-valsartan  1 tablet Oral BID   Continuous Infusions:  cefTRIAXone (ROCEPHIN)  IV Stopped (07/16/21 0911)   PRN Meds: albuterol, docusate sodium, loperamide, polyethylene glycol   Vital Signs    Vitals:   07/17/21 0600 07/17/21 0700 07/17/21 0730 07/17/21 0744  BP: 118/76 132/83 132/83   Pulse: (!) 55 (!) 50 (!) 56 (!) 53  Resp: 17 15 15    Temp:      TempSrc:      SpO2: 96% 95% 95%   Weight:      Height:        Intake/Output Summary (Last 24 hours) at 07/17/2021 0807 Last data filed at 07/17/2021 0700 Gross per 24 hour  Intake 852.59 ml  Output 1450 ml  Net -597.41 ml   Last 3 Weights 07/17/2021 07/16/2021 07/15/2021  Weight (lbs) 230 lb 9.6 oz 235 lb 3.7 oz 237 lb 14 oz  Weight (kg) 104.6 kg 106.7 kg 107.9 kg      Telemetry    Sinus rhythm/sinus bradycardia with PAC's and PVC's.  HR 35-70 bpm (mostly in the 50's) - Personally Reviewed  ECG    No new tracing  Physical Exam   GEN: No acute distress.   Neck: No JVD Cardiac: RRR, no murmurs, rubs, or gallops.  Respiratory: Clear to auscultation bilaterally. GI: Soft, nontender, non-distended  MS: No edema; No deformity. Neuro:  Nonfocal  Psych: Normal affect   Labs    High Sensitivity  Troponin:   Recent Labs  Lab 07/11/21 2147 07/12/21 0022  TROPONINIHS 57* 50*     Chemistry Recent Labs  Lab 07/11/21 2147 07/12/21 0422 07/14/21 0450 07/14/21 0539 07/15/21 0451 07/16/21 0401  NA 133*   < > 138  --  140 137  K 3.2*   < > 3.3*  --  3.7 3.7  CL 101   < > 108  --  109 109  CO2 19*   < > 23  --  23 21*  GLUCOSE 118*   < > 155*  --  132* 121*  BUN 54*   < > 31*  --  26* 24*  CREATININE 3.55*   < > 1.06  --  0.92 0.97  CALCIUM 8.5*   < > 8.3*  --  8.2* 8.0*  MG  --    < >  --  2.0 1.6* 1.9  PROT 6.4*  --   --   --   --   --   ALBUMIN 3.2*  --   --   --   --   --   AST 19  --   --   --   --   --   ALT 15  --   --   --   --   --  ALKPHOS 53  --   --   --   --   --   BILITOT 1.1  --   --   --   --   --   GFRNONAA 18*   < > >60  --  >60 >60  ANIONGAP 13   < > 7  --  8 7   < > = values in this interval not displayed.    Lipids No results for input(s): CHOL, TRIG, HDL, LABVLDL, LDLCALC, CHOLHDL in the last 168 hours.  Hematology Recent Labs  Lab 07/15/21 0451 07/16/21 0401 07/17/21 0607  WBC 10.2 9.9 10.3  RBC 3.66* 3.70* 3.84*  HGB 12.1* 12.2* 13.0  HCT 34.9* 35.0* 37.1*  MCV 95.4 94.6 96.6  MCH 33.1 33.0 33.9  MCHC 34.7 34.9 35.0  RDW 13.8 13.6 13.6  PLT 123* 143* 169   Thyroid  Recent Labs  Lab 07/15/21 0451  TSH 0.361*    BNP Recent Labs  Lab 07/12/21 0022  BNP 223.1*    DDimer No results for input(s): DDIMER in the last 168 hours.   Radiology    No results found.  Cardiac Studies   TTE (04/23/2021):  1. Left ventricular ejection fraction, by estimation, is 30 to 35%. The  left ventricle has moderately decreased function. The left ventricle  demonstrates regional wall motion abnormalities (see scoring  diagram/findings for description). The left  ventricular internal cavity size was mildly to moderately dilated. Left  ventricular diastolic parameters are consistent with Grade I diastolic  dysfunction (impaired relaxation).    2. Right ventricular systolic function is normal. The right ventricular  size is mildly enlarged. Tricuspid regurgitation signal is inadequate for  assessing PA pressure.   3. The mitral valve is grossly normal. Mild mitral valve regurgitation.  No evidence of mitral stenosis.   4. The aortic valve is tricuspid. Aortic valve regurgitation is not  visualized. No aortic stenosis is present.   5. The inferior vena cava is normal in size with greater than 50%  respiratory variability, suggesting right atrial pressure of 3 mmHg.   Patient Profile     66 y.o. male man with h/o CAD s/p PCI (2013), chronic HFrEF (LVEF 30-35%), admitted with sepsis secondary to pyelonephritis with obstructing renal stone s/p right ureteral stent placement.  Assessment & Plan    Paroxysmal atrial fibrillation: Likely precipitated by sepsis.  He has been maintaining sinus rhythm on amiodarone.  Persistent bradycardia noted, likely due to a combination of increased vagal tone in the setting of pyelonephritis as well as medications (carvedilol and amiodarone). - Discontinue carvedilol. - Continue amiodarone 200 mg twice daily to complete 10 gm load.  Hopefully, amiodarone can be discontinued when [name] Walker has recovered from his acute illness. - Agree with holding anticoagulation in the setting of isolated brief episode of a-fib with plans for lithotripsy later this week. - Consider ambulatory monitoring after d/c to assess for recurrent PAF.  Chronic HFrEF: [name] Walker appears euvolemic.  He is negative 1.9 L this admission. - Maintain net even fluid balance.  No need for standing diuretic at this time. - D/c carvedilol given persistent bradycardia. - Continue Entresto. - Consider holding dapagliflozin in the setting of pyelonephritis; will defer decision to primary team.  Sepsis due to pyelonephritis and ureteral obstruction: Clinically improved, awaiting lithotripsy with urology. - Continue antibiotics per  IM/ID. - Patient without ongoing cardiac symptoms.  He is low-risk for perioperative cardiac complications and can proceed without additional cardiac testing/intervention.  For questions or updates, please contact [place] Please consult [email or web address] for contact info under [facility].     Signed, Nelva Bush, MD  07/17/2021, 8:07 AM

## 2021-07-17 NOTE — Discharge Summary (Signed)
Physician Discharge Summary  [name] Walker [id number] DOB: Jun 08, 1956 DOA: 07/11/2021  PCP: Tracie Harrier, MD  Admit date: 07/11/2021 Discharge date: 07/17/2021  Admitted From: Home  Disposition:  Home   Recommendations for Outpatient Follow-up:  Follow up with PCP in 1-2 weeks Please obtain BMP/CBC in one week Needs follow up with Dr Yves Dill to determine date for procedure, lithotripsy.  Needs follow up with cardiology for further evaluation of A fib, and recommendation regards to when stop amiodarone.  Needs repeat TSH in 4 weeks.    Discharge Condition: Stable.  CODE STATUS: Full code Diet recommendation: Heart Healthy  Brief/Interim Summary: 66 year old with past medical history significant for CAD, history of anterior MI in 2013 secondary to occluded proximal LAD that was treated with PCI stents, ischemic cardiomyopathy with ejection fraction [id number], chronic systolic heart failure, ongoing tobacco abuse, hypertension, OSA, chronic kidney stones presented with altered mental status.  Per family patient developed fevers and associated right flank pain the day prior to admission.  He started to have nausea vomiting 2 days prior to admission, also hematuria.   Patient was admitted by critical care team for sepsis secondary to obstructive uropathy, UTI, systolic blood pressure in the 60.  AKI with a creatinine of 3.5.  CT chest abdomen and pelvis showed obstructing 10 mm proximal right ureteral stone with severe right hydronephrosis and perinephric stranding.  Patient was a started on IV fluids, broad-spectrum IV antibiotic, Levophed.   He underwent cystoscopy with right stent placement on 1/12 by Dr. Yves Dill.   His care was transferred to [facility] 1/13.   He develops A fib with RVR with low blood pressure.  Cardiology was consulted, patient was started on IV amiodarone, and heparin drip. Subsequently he was transition to oral amiodarone and heparin discontinue.      1-Sepsis with Septic  shock secondary to acute obstructive pyelonephritis and possible underlying pneumonia: -Patient admitted to the ICU, he received IV fluids, broad-spectrum antibiotics and IV pressors -He underwent cystoscopy with right stent placement on 1/12 by Dr. Eliberto Ivory. -Treated with IV ceftriaxone. Discharge on Keflex for 7-10 days.  -Urine culture no growth to date.  -Received IV stress dose steroids. Discontinue today.  -received midodrine, subsequently discontinue. BP improved.  -Blood culture: 2 anaerobic Blood culture positive, Stap Epi, per ID likely contaminant. Patient received IV ceftriaxone while in the hospital. Repeated Blood culture no growth to date.  -ID to follow on patient for final recommendation in regards antibiotics. Plan to discharge on Keflex for 7 days.    2-AKI secondary to acute obstructing uropathy secondary to right ureteral lithiasis -Patient underwent cystoscopy and right ureteral stent placement 1/12 -Renal function significantly improved, creatinine peak to 2.5, it has decreased to 1.2. -He received IV fluids. -He will need to follow-up with Dr. Yves Dill  for lithotripsy and ureteral stent removal. Discussed with Dr Yves Dill he will arrange procedure out patient, when patient has recover from recent hospitalization.   -Stable.   3-A fib RVR, with Hypotension;  -He was on Coreg at home, unable to do beta-blockers due to hypotension -He was a started on IV amiodarone drip. Now on oral amiodarone.  -He received IV heparin.  -Cardiology consulted and following.  -Patient will need cardiac monitor at discharge.  Cardiology recommend to discontinue heparin, no need for oral anticoagulation. A fib in setting of Sepsis.  Discussed with cardiology: discharge patient on Amiodarone 200 mg BID for 14 days then 200 mg PO daily.   4--Hypokalemia: Replaced.  5--Hypomagnesemia:  Replaced.    6-Acute on chronic hypoxic respiratory failure secondary to possible pneumonia, AECOPD  -Past medical  history COPD, current tobacco abuse, OSA: Continue with  oxygen supplementation. Continue with incentive spirometry. Continue with nebulizer, Pulmicort.  Completed treatment.    7-Chronic systolic heart failure, last known ejection fraction 35% History of hypertension, CAD anterior MI 2013 s/p PCI to LAD.  Ischemic cardiomyopathy, HLD -BP improved.  Entresto resume by cardiology.  -Carvedilol discontinue due to bradycardia.   8-Acute metabolic encephalopathy due to sepsis: Resolved. Alert and conversant.    Low TSH. Free T 3 and T 4 Normal.  TSH0.072 Needs repeat TSH in 4 weeks.    Estimated body mass index is 32.81 kg/m as calculated from the following:   Height as of this encounter: 5\' 11"  (1.803 m).    Discharge Diagnoses:  Principal Problem:   Sepsis with hypotension ([place]) Active Problems:   Tobacco abuse   Chronic systolic heart failure ([facility])   Dyslipidemia   Kidney stone on right side   AKI (acute kidney injury) ([place])   Acute unilateral obstructive uropathy   Atrial fibrillation ([facility])   HFrEF (heart failure with reduced ejection fraction) [facility])    Discharge Instructions  Discharge Instructions     Diet - low sodium heart healthy   Complete by: As directed    Increase activity slowly   Complete by: As directed    No wound care   Complete by: As directed    Nursing communication   Complete by: As directed    Instruct patient to not pull on string coming out of urethra      Allergies as of 07/17/2021       Reactions   Nucynta [tapentadol] Nausea Only, Other (See Comments)   Dizziness, woozy.  Sick to his stomach   Codeine Other (See Comments)   Too drowsy        Medication List     STOP taking these medications    acetaminophen 500 MG tablet Commonly known as: TYLENOL   carvedilol 6.25 MG tablet Commonly known as: COREG   dapagliflozin propanediol 5 MG Tabs tablet Commonly known as: Farxiga   levofloxacin 500 MG tablet Commonly known  as: LEVAQUIN   nitroGLYCERIN 0.4 MG SL tablet Commonly known as: NITROSTAT   ondansetron 4 MG disintegrating tablet Commonly known as: ZOFRAN-ODT   oxyCODONE-acetaminophen 5-325 MG tablet Commonly known as: PERCOCET/ROXICET   tamsulosin 0.4 MG Caps capsule Commonly known as: FLOMAX       TAKE these medications    amiodarone 200 MG tablet Commonly known as: PACERONE Take 1 tablet (200 mg total) by mouth 2 (two) times daily for 14 days. Take amiodarone 200 mg Twice a day for 14 days then take 200 mg daily.   aspirin 81 MG EC tablet Take 1 tablet (81 mg total) by mouth daily. Swallow whole. Start taking on: July 18, 2021   atorvastatin 20 MG tablet Commonly known as: LIPITOR Take 1 tablet (20 mg total) by mouth at bedtime. What changed:  medication strength how much to take when to take this   cephALEXin 500 MG capsule Commonly known as: KEFLEX Take 1 capsule (500 mg total) by mouth 3 (three) times daily for 10 days.   Entresto 24-26 MG Generic drug: sacubitril-valsartan Take 1 tablet by mouth 2 (two) times daily.   ipratropium-albuterol 0.5-2.5 (3) MG/3ML Soln Commonly known as: DUONEB Take 3 mLs by nebulization 2 (two) times daily.  Allergies  Allergen Reactions   Nucynta [Tapentadol] Nausea Only and Other (See Comments)    Dizziness, woozy.  Sick to his stomach   Codeine Other (See Comments)    Too drowsy    Consultations: ID Cardiology Urology    Procedures/Studies: CT ABDOMEN PELVIS W WO CONTRAST  Result Date: 06/26/2021 CLINICAL DATA:  Microscopic hematuria EXAM: CT ABDOMEN AND PELVIS WITHOUT AND WITH CONTRAST TECHNIQUE: Multidetector CT imaging of the abdomen and pelvis was performed following the standard protocol before and following the bolus administration of intravenous contrast. CONTRAST:  162mL OMNIPAQUE IOHEXOL 350 MG/ML SOLN COMPARISON:  MRI February 02, 2015 and CT January 06, 2015 FINDINGS: Lower chest: No acute abnormality.  Hepatobiliary: No suspicious hepatic lesion. Gallbladder is unremarkable. No biliary ductal dilation. Pancreas: No pancreatic ductal dilation or evidence of acute inflammation. Spleen: Normal in size without focal abnormality. Adrenals/Urinary Tract: Bilateral adrenal glands are unremarkable. No hydronephrosis. Nonobstructive 11 mm right lower pole renal stone. No obstructive ureteral or bladder calculi. Right renal cysts measuring up to 2 cm. No solid enhancing renal mass. The kidneys demonstrate symmetric enhancement and excretion of contrast material. No collecting system blue duplication. No suspicious filling defect visualized within the opacified portions of the collecting systems or ureters on delayed imaging. Mild wall thickening of an incompletely distended urinary bladder. Stomach/Bowel: No enteric contrast was administered. Stomach is distended with ingested material without wall thickening. No pathologic dilation of small or large bowel. The terminal ileum appears normal. The appendix is not confidently identified however there is no pericecal inflammation. Descending and sigmoid colonic diverticulosis without findings of acute diverticulitis. Vascular/Lymphatic: Aortic and branch vessel atherosclerosis without abdominal aortic aneurysm. No pathologically enlarged abdominal or pelvic lymph nodes. Reproductive: Prostate is unremarkable. Other: Bilateral fat containing inguinal hernias. No significant abdominopelvic free fluid. Musculoskeletal: Thoracolumbar spondylosis. No acute osseous abnormality. IMPRESSION: 1. Nonobstructive 11 mm right lower pole renal stone. No obstructive ureteral or bladder calculi. 2. No solid enhancing renal mass. 3. Descending and sigmoid colonic diverticulosis without findings of acute diverticulitis. 4. Bilateral fat containing inguinal hernias. 5.  Aortic Atherosclerosis (ICD10-I70.0). Electronically Signed   By: Dahlia Bailiff M.D.   On: 06/26/2021 22:42   US  RENAL  Result Date: 07/17/2021 CLINICAL DATA:  Hydronephrosis, post stenting EXAM: RENAL / URINARY TRACT ULTRASOUND COMPLETE COMPARISON:  CT abdomen and pelvis 07/12/2021 FINDINGS: Right Kidney: Renal measurements: 12.4 x 6.0 x 5.8 cm = volume: 225 mL. Cortical thinning. Upper normal cortical echogenicity. Significantly decreased RIGHT hydronephrosis since previous exam. Small mid renal cyst 2.2 x 2.0 x 1.8 cm. Additional small upper pole cyst 1.2 x 1.2 x 0.8 cm. No shadowing calcifications. Left Kidney: Renal measurements: 12.1 x 5.9 x 5.2 cm = volume: 194 mL. Normal cortical thickness. Upper normal cortical echogenicity. Tiny peripelvic cyst 11 mm diameter. No additional mass or hydronephrosis. No shadowing calcification. Bladder: Appears normal for degree of bladder distention. Small amount of debris within urine. RIGHT ureteral stent noted. Other: N/A IMPRESSION: Decreased RIGHT hydronephrosis post stenting. Small BILATERAL renal cysts. Electronically Signed   By: Lavonia Dana M.D.   On: 07/17/2021 10:55   DG Chest Port 1 View  Result Date: 07/12/2021 CLINICAL DATA:  Central line placement EXAM: PORTABLE CHEST 1 VIEW COMPARISON:  Yesterday FINDINGS: New right IJ line with tip at the SVC. No pneumothorax or new mediastinal widening. Interstitial coarsening which is similar to prior. COPD by CT. Normal heart size. No effusion or consolidation. IMPRESSION: New central line without complicating feature.  Stable aeration since yesterday. Electronically Signed   By: Tiburcio Pea M.D.   On: 07/12/2021 05:37   DG Chest Portable 1 View  Result Date: 07/11/2021 CLINICAL DATA:  Shortness of breath EXAM: PORTABLE CHEST 1 VIEW COMPARISON:  03/11/2015 FINDINGS: Central airways thickening with possible developing patchy infiltrate at left base. Normal cardiac size. No pneumothorax. Aortic atherosclerosis IMPRESSION: Central airways thickening suggesting bronchitis, with possible developing infiltrate at left base.  Electronically Signed   By: Jasmine Pang M.D.   On: 07/11/2021 22:11   DG OR UROLOGY CYSTO IMAGE ([facility] ONLY)  Result Date: 07/12/2021 There is no interpretation for this exam.  This order is for images obtained during a surgical procedure.  Please See "Surgeries" Tab for more information regarding the procedure.   CT CHEST ABDOMEN PELVIS WO CONTRAST  Addendum Date: 07/12/2021   ADDENDUM REPORT: 07/12/2021 04:21 ADDENDUM: This addendum is requested by urology. The 10 mm proximal right ureteral stone described above is obstructing, causing severe right hydronephrosis with perinephric stranding. Electronically Signed   By: Almira Bar M.D.   On: 07/12/2021 04:21   Result Date: 07/12/2021 CLINICAL DATA:  Pneumonia, complication suspected, xray done. Fever. Altered mental status. Shortness of breath. EXAM: CT CHEST, ABDOMEN AND PELVIS WITHOUT CONTRAST TECHNIQUE: Multidetector CT imaging of the chest, abdomen and pelvis was performed following the standard protocol without IV contrast. RADIATION DOSE REDUCTION: This exam was performed according to the departmental dose-optimization program which includes automated exposure control, adjustment of the mA and/or kV according to patient size and/or use of iterative reconstruction technique. COMPARISON:  Chest x-ray today.  CT 06/26/2021 FINDINGS: CT CHEST FINDINGS Cardiovascular: Heart is normal size. Aorta is normal caliber. Moderate coronary artery and scattered aortic calcifications. Mediastinum/Nodes: No mediastinal, hilar, or axillary adenopathy. Trachea and esophagus are unremarkable. Thyroid unremarkable. Lungs/Pleura: Paraseptal and centrilobular emphysema. Predominantly linear opacities at the lung bases, favor atelectasis. No effusions. Musculoskeletal: Chest wall soft tissues are unremarkable. No acute bony abnormality. CT ABDOMEN PELVIS FINDINGS Hepatobiliary: No focal hepatic abnormality. Gallbladder unremarkable. Pancreas: No focal abnormality or  ductal dilatation. Spleen: No focal abnormality.  Normal size. Adrenals/Urinary Tract: Severe right hydronephrosis and perinephric stranding due to 10 mm proximal right ureteral stone. No stones or hydronephrosis on the left. Adrenal glands and urinary bladder unremarkable. Stomach/Bowel: Sigmoid diverticulosis. No active diverticulitis. Stomach and small bowel decompressed, unremarkable. Vascular/Lymphatic: Aortic atherosclerosis. No evidence of aneurysm or adenopathy. Reproductive: No visible focal abnormality. Other: No free fluid or free air. Small bilateral inguinal hernias containing fat. Musculoskeletal: No acute bony abnormality. IMPRESSION: Coronary artery disease, aortic atherosclerosis. Bibasilar opacities, favor atelectasis. 10 mm proximal right ureteral stone with severe right hydronephrosis and perinephric stranding. Aortic atherosclerosis. Sigmoid diverticulosis. Electronically Signed: By: Charlett Nose M.D. On: 07/12/2021 00:56     Subjective: He is feeling well, looking forward going home   Discharge Exam: Vitals:   07/17/21 1215 07/17/21 1300  BP:  112/73  Pulse: 61 65  Resp: 16 16  Temp:  98.1 F (36.7 C)  SpO2: 94% 96%     General: Pt is alert, awake, not in acute distress Cardiovascular: RRR, S1/S2 +, no rubs, no gallops Respiratory: CTA bilaterally, no wheezing, no rhonchi Abdominal: Soft, NT, ND, bowel sounds + Extremities: no edema, no cyanosis    The results of significant diagnostics from this hospitalization (including imaging, microbiology, ancillary and laboratory) are listed below for reference.     Microbiology: Recent Results (from the past 240 hour(s))  Resp Panel by  RT-PCR (Flu A&B, Covid) Nasopharyngeal Swab     Status: None   Collection Time: 07/11/21  8:03 PM   Specimen: Nasopharyngeal Swab; Nasopharyngeal(NP) swabs in vial transport medium  Result Value Ref Range Status   SARS Coronavirus 2 by RT PCR NEGATIVE NEGATIVE Final    Comment:  (NOTE) SARS-CoV-2 target nucleic acids are NOT DETECTED.  The SARS-CoV-2 RNA is generally detectable in upper respiratory specimens during the acute phase of infection. The lowest concentration of SARS-CoV-2 viral copies this assay can detect is 138 copies/mL. A negative result does not preclude SARS-Cov-2 infection and should not be used as the sole basis for treatment or other patient management decisions. A negative result may occur with  improper specimen collection/handling, submission of specimen other than nasopharyngeal swab, presence of viral mutation(s) within the areas targeted by this assay, and inadequate number of viral copies(<138 copies/mL). A negative result must be combined with clinical observations, patient history, and epidemiological information. The expected result is Negative.  Fact Sheet for Patients:  EntrepreneurPulse.com.au  Fact Sheet for Healthcare Providers:  IncredibleEmployment.be  This test is no t yet approved or cleared by the [place] FDA and  has been authorized for detection and/or diagnosis of SARS-CoV-2 by FDA under an Emergency Use Authorization (EUA). This EUA will remain  in effect (meaning this test can be used) for the duration of the COVID-19 declaration under Section 564(b)(1) of the Act, 21 U.S.C.section 360bbb-3(b)(1), unless the authorization is terminated  or revoked sooner.       Influenza A by PCR NEGATIVE NEGATIVE Final   Influenza B by PCR NEGATIVE NEGATIVE Final    Comment: (NOTE) The Xpert Xpress SARS-CoV-2/FLU/RSV plus assay is intended as an aid in the diagnosis of influenza from Nasopharyngeal swab specimens and should not be used as a sole basis for treatment. Nasal washings and aspirates are unacceptable for Xpert Xpress SARS-CoV-2/FLU/RSV testing.  Fact Sheet for Patients: EntrepreneurPulse.com.au  Fact Sheet for Healthcare  Providers: IncredibleEmployment.be  This test is not yet approved or cleared by the [place] FDA and has been authorized for detection and/or diagnosis of SARS-CoV-2 by FDA under an Emergency Use Authorization (EUA). This EUA will remain in effect (meaning this test can be used) for the duration of the COVID-19 declaration under Section 564(b)(1) of the Act, 21 U.S.C. section 360bbb-3(b)(1), unless the authorization is terminated or revoked.  Performed at [facility], [address] [place], [place] [address]   Blood culture (routine x 2)     Status: Abnormal   Collection Time: 07/11/21  9:47 PM   Specimen: BLOOD  Result Value Ref Range Status   Specimen Description   Final    BLOOD LEFT HAND Performed at [facility], [address] [place], [place] [address]    Special Requests   Final    BOTTLES DRAWN AEROBIC AND ANAEROBIC Blood Culture adequate volume Performed at [facility], [address] [place], [place] [address]    Culture  Setup Time   Final    GRAM POSITIVE COCCI ANAEROBIC BOTTLE ONLY CRITICAL RESULT CALLED TO, READ BACK BY AND VERIFIED WITH: BRANDON BEERS AT 1332 07/13/21.PMF Performed at [facility], [place] [address], [place], [place] [address]    Culture STAPHYLOCOCCUS EPIDERMIDIS (A)  Final   Report Status 07/16/2021 FINAL  Final   Organism ID, Bacteria STAPHYLOCOCCUS EPIDERMIDIS  Final      Susceptibility   Staphylococcus epidermidis - MIC*    CIPROFLOXACIN 4 RESISTANT  Resistant     ERYTHROMYCIN <=0.25 SENSITIVE Sensitive     GENTAMICIN <=0.5 SENSITIVE Sensitive     OXACILLIN <=0.25 SENSITIVE Sensitive     TETRACYCLINE 2 SENSITIVE Sensitive     VANCOMYCIN <=0.5 SENSITIVE Sensitive     TRIMETH/SULFA 80 RESISTANT Resistant     CLINDAMYCIN <=0.25 SENSITIVE Sensitive     RIFAMPIN <=0.5 SENSITIVE Sensitive     Inducible Clindamycin NEGATIVE Sensitive     * STAPHYLOCOCCUS EPIDERMIDIS  Blood  culture (routine x 2)     Status: Abnormal   Collection Time: 07/11/21  9:47 PM   Specimen: BLOOD  Result Value Ref Range Status   Specimen Description   Final    BLOOD RIGHT ASSIST CONTROL Performed at [facility], [address] [place], [place] [address]    Special Requests   Final    BOTTLES DRAWN AEROBIC AND ANAEROBIC Blood Culture results may not be optimal due to an excessive volume of blood received in culture bottles Performed at [facility], [address] [place], [place] [address]    Culture  Setup Time   Final    GRAM POSITIVE COCCI IN BOTH AEROBIC AND ANAEROBIC BOTTLES CRITICAL RESULT CALLED TO, READ BACK BY AND VERIFIED WITH: PHARMD NATHAN BELUE AT 2300 07/13/2021 [place] Performed at [facility], [address] [place], [place] [address]    Culture (A)  Final    STAPHYLOCOCCUS EPIDERMIDIS SUSCEPTIBILITIES PERFORMED ON PREVIOUS CULTURE WITHIN THE LAST 5 DAYS. Performed at [facility], [address] [place], [place] [address]    Report Status 07/16/2021 FINAL  Final  Blood Culture ID Panel (Reflexed)     Status: Abnormal   Collection Time: 07/11/21  9:47 PM  Result Value Ref Range Status   Enterococcus faecalis NOT DETECTED NOT DETECTED Final   Enterococcus Faecium NOT DETECTED NOT DETECTED Final   Listeria monocytogenes NOT DETECTED NOT DETECTED Final   Staphylococcus species DETECTED (A) NOT DETECTED Final    Comment: CRITICAL RESULT CALLED TO, READ BACK BY AND VERIFIED WITH: BRANDON BEERS AT 1332 07/13/21.PMF    Staphylococcus aureus (BCID) NOT DETECTED NOT DETECTED Final   Staphylococcus epidermidis DETECTED (A) NOT DETECTED Final    Comment: CRITICAL RESULT CALLED TO, READ BACK BY AND VERIFIED WITH: BRANDON BEERS AT 1332 07/13/21.PMF    Staphylococcus lugdunensis NOT DETECTED NOT DETECTED Final   Streptococcus species NOT DETECTED NOT DETECTED Final   Streptococcus agalactiae NOT DETECTED NOT DETECTED Final    Streptococcus pneumoniae NOT DETECTED NOT DETECTED Final   Streptococcus pyogenes NOT DETECTED NOT DETECTED Final   A.calcoaceticus-baumannii NOT DETECTED NOT DETECTED Final   Bacteroides fragilis NOT DETECTED NOT DETECTED Final   Enterobacterales NOT DETECTED NOT DETECTED Final   Enterobacter cloacae complex NOT DETECTED NOT DETECTED Final   Escherichia coli NOT DETECTED NOT DETECTED Final   Klebsiella aerogenes NOT DETECTED NOT DETECTED Final   Klebsiella oxytoca NOT DETECTED NOT DETECTED Final   Klebsiella pneumoniae NOT DETECTED NOT DETECTED Final   Proteus species NOT DETECTED NOT DETECTED Final   Salmonella species NOT DETECTED NOT DETECTED Final   Serratia marcescens NOT DETECTED NOT DETECTED Final   Haemophilus influenzae NOT DETECTED NOT DETECTED Final   Neisseria meningitidis NOT DETECTED NOT DETECTED Final   Pseudomonas aeruginosa NOT DETECTED NOT DETECTED Final   Stenotrophomonas maltophilia NOT DETECTED NOT DETECTED Final   Candida albicans NOT DETECTED NOT DETECTED Final   Candida auris NOT DETECTED NOT DETECTED Final   Candida  glabrata NOT DETECTED NOT DETECTED Final   Candida krusei NOT DETECTED NOT DETECTED Final   Candida parapsilosis NOT DETECTED NOT DETECTED Final   Candida tropicalis NOT DETECTED NOT DETECTED Final   Cryptococcus neoformans/gattii NOT DETECTED NOT DETECTED Final   Methicillin resistance mecA/C NOT DETECTED NOT DETECTED Final    Comment: Performed at [facility], [address], [place], [place] [address]  Urine Culture     Status: None   Collection Time: 07/11/21 11:08 PM   Specimen: Urine, Random  Result Value Ref Range Status   Specimen Description   Final    URINE, RANDOM Performed at [facility], [address], [place], [place] [address]    Special Requests   Final    NONE Performed at [facility], [address], [place], [place] [address]    Culture   Final    NO GROWTH Performed at [facility], [place] [address], [place], [place] [address]    Report Status 07/12/2021 FINAL  Final  MRSA Next Gen by PCR, Nasal     Status: None   Collection Time: 07/12/21  2:56 AM   Specimen: Nasal Mucosa; Nasal Swab  Result Value Ref Range Status   MRSA by PCR Next Gen NOT DETECTED NOT DETECTED Final    Comment: (NOTE) The GeneXpert MRSA Assay (FDA approved for NASAL specimens only), is one component of a comprehensive MRSA colonization surveillance program. It is not intended to diagnose MRSA infection nor to guide or monitor treatment for MRSA infections. Test performance is not FDA approved in patients less than 46 years old. Performed at [facility], [address] [place], [place] [address]   CULTURE, BLOOD (ROUTINE X 2) w Reflex to ID Panel     Status: None (Preliminary result)   Collection Time: 07/14/21  5:25 AM   Specimen: BLOOD  Result Value Ref Range Status   Specimen Description BLOOD BLOOD LEFT HAND  Final   Special Requests   Final    BOTTLES DRAWN AEROBIC AND ANAEROBIC Blood Culture adequate volume   Culture   Final    NO GROWTH 3 DAYS Performed at [facility], [address] [place],  [address]    Report Status PENDING  Incomplete  CULTURE, BLOOD (ROUTINE X 2) w Reflex to ID Panel     Status: None (Preliminary result)   Collection Time: 07/14/21  5:30 AM   Specimen: BLOOD  Result Value Ref Range Status   Specimen Description BLOOD BLOOD RIGHT HAND  Final   Special Requests   Final    BOTTLES DRAWN AEROBIC AND ANAEROBIC Blood Culture adequate volume   Culture   Final    NO GROWTH 3 DAYS Performed at [facility], [address] [place],  [address]    Report Status PENDING  Incomplete     Labs: BNP (last 3 results) Recent Labs    07/12/21 0022  BNP [id number]*   Basic Metabolic Panel: Recent Labs  Lab 07/12/21 0422 07/13/21 0338 07/14/21 0450 07/14/21 0539 07/15/21 0451 07/16/21 0401  NA 135 138 138  --   140 137  K 3.4* 3.1* 3.3*  --  3.7 3.7  CL 104 107 108  --  109 109  CO2 19* 24 23  --  23 21*  GLUCOSE 127* 142* 155*  --  132* 121*  BUN 49* 34* 31*  --  26* 24*  CREATININE 2.76* 1.23 1.06  --  0.92  0.97  CALCIUM 8.2* 8.5* 8.3*  --  8.2* 8.0*  MG 1.5* 2.1  --  2.0 1.6* 1.9  PHOS 3.7  --   --   --   --  4.5   Liver Function Tests: Recent Labs  Lab 07/11/21 2147  AST 19  ALT 15  ALKPHOS 53  BILITOT 1.1  PROT 6.4*  ALBUMIN 3.2*   No results for input(s): LIPASE, AMYLASE in the last 168 hours. No results for input(s): AMMONIA in the last 168 hours. CBC: Recent Labs  Lab 07/11/21 2147 07/12/21 0422 07/14/21 0450 07/15/21 0451 07/16/21 0401 07/17/21 0607  WBC 3.9* 6.7 9.7 10.2 9.9 10.3  NEUTROABS 3.1  --   --   --   --   --   HGB 13.5 12.6* 12.5* 12.1* 12.2* 13.0  HCT 38.0* 35.4* 35.2* 34.9* 35.0* 37.1*  MCV 96.7 95.9 96.7 95.4 94.6 96.6  PLT 105* 96* 107* 123* 143* 169   Cardiac Enzymes: No results for input(s): CKTOTAL, CKMB, CKMBINDEX, TROPONINI in the last 168 hours. BNP: Invalid input(s): POCBNP CBG: Recent Labs  Lab 07/12/21 0254  GLUCAP 127*   D-Dimer No results for input(s): DDIMER in the last 72 hours. Hgb A1c No results for input(s): HGBA1C in the last 72 hours. Lipid Profile No results for input(s): CHOL, HDL, LDLCALC, TRIG, CHOLHDL, LDLDIRECT in the last 72 hours. Thyroid function studies Recent Labs    07/15/21 0451  TSH 0.361*  T4TOTAL 4.5   Anemia work up No results for input(s): VITAMINB12, FOLATE, FERRITIN, TIBC, IRON, RETICCTPCT in the last 72 hours. Urinalysis    Component Value Date/Time   COLORURINE YELLOW (A) 07/11/2021 2308   APPEARANCEUR CLOUDY (A) 07/11/2021 2308   LABSPEC 1.012 07/11/2021 2308   PHURINE 5.0 07/11/2021 2308   GLUCOSEU 150 (A) 07/11/2021 2308   HGBUR LARGE (A) 07/11/2021 2308   BILIRUBINUR NEGATIVE 07/11/2021 2308   KETONESUR NEGATIVE 07/11/2021 2308   PROTEINUR 30 (A) 07/11/2021 2308   UROBILINOGEN 0.2  07/20/2012 1137   NITRITE NEGATIVE 07/11/2021 2308   LEUKOCYTESUR NEGATIVE 07/11/2021 2308   Sepsis Labs Invalid input(s): PROCALCITONIN,  WBC,  LACTICIDVEN Microbiology Recent Results (from the past 240 hour(s))  Resp Panel by RT-PCR (Flu A&B, Covid) Nasopharyngeal Swab     Status: None   Collection Time: 07/11/21  8:03 PM   Specimen: Nasopharyngeal Swab; Nasopharyngeal(NP) swabs in vial transport medium  Result Value Ref Range Status   SARS Coronavirus 2 by RT PCR NEGATIVE NEGATIVE Final    Comment: (NOTE) SARS-CoV-2 target nucleic acids are NOT DETECTED.  The SARS-CoV-2 RNA is generally detectable in upper respiratory specimens during the acute phase of infection. The lowest concentration of SARS-CoV-2 viral copies this assay can detect is 138 copies/mL. A negative result does not preclude SARS-Cov-2 infection and should not be used as the sole basis for treatment or other patient management decisions. A negative result may occur with  improper specimen collection/handling, submission of specimen other than nasopharyngeal swab, presence of viral mutation(s) within the areas targeted by this assay, and inadequate number of viral copies(<138 copies/mL). A negative result must be combined with clinical observations, patient history, and epidemiological information. The expected result is Negative.  Fact Sheet for Patients:  BloggerCourse.comhttps://www.fda.gov/media/152166/download  Fact Sheet for Healthcare Providers:  SeriousBroker.ithttps://www.fda.gov/media/152162/download  This test is no t yet approved or cleared by the [place] FDA and  has been authorized for detection and/or diagnosis of SARS-CoV-2 by FDA under an Emergency Use Authorization (EUA). This  EUA will remain  in effect (meaning this test can be used) for the duration of the COVID-19 declaration under Section 564(b)(1) of the Act, 21 U.S.C.section 360bbb-3(b)(1), unless the authorization is terminated  or revoked sooner.        Influenza A by PCR NEGATIVE NEGATIVE Final   Influenza B by PCR NEGATIVE NEGATIVE Final    Comment: (NOTE) The Xpert Xpress SARS-CoV-2/FLU/RSV plus assay is intended as an aid in the diagnosis of influenza from Nasopharyngeal swab specimens and should not be used as a sole basis for treatment. Nasal washings and aspirates are unacceptable for Xpert Xpress SARS-CoV-2/FLU/RSV testing.  Fact Sheet for Patients: EntrepreneurPulse.com.au  Fact Sheet for Healthcare Providers: IncredibleEmployment.be  This test is not yet approved or cleared by the [place] FDA and has been authorized for detection and/or diagnosis of SARS-CoV-2 by FDA under an Emergency Use Authorization (EUA). This EUA will remain in effect (meaning this test can be used) for the duration of the COVID-19 declaration under Section 564(b)(1) of the Act, 21 U.S.C. section 360bbb-3(b)(1), unless the authorization is terminated or revoked.  Performed at [facility], [address] [place], [place] [address]   Blood culture (routine x 2)     Status: Abnormal   Collection Time: 07/11/21  9:47 PM   Specimen: BLOOD  Result Value Ref Range Status   Specimen Description   Final    BLOOD LEFT HAND Performed at [facility], [address], [place], [place] [address]    Special Requests   Final    BOTTLES DRAWN AEROBIC AND ANAEROBIC Blood Culture adequate volume Performed at [facility], [address] [place], [place] [address]    Culture  Setup Time   Final    GRAM POSITIVE COCCI ANAEROBIC BOTTLE ONLY CRITICAL RESULT CALLED TO, READ BACK BY AND VERIFIED WITH: BRANDON BEERS AT 1332 07/13/21.PMF Performed at [facility], [place] [address], [place], [place] [address]    Culture STAPHYLOCOCCUS EPIDERMIDIS (A)  Final   Report Status 07/16/2021 FINAL  Final   Organism ID, Bacteria STAPHYLOCOCCUS EPIDERMIDIS  Final      Susceptibility    Staphylococcus epidermidis - MIC*    CIPROFLOXACIN 4 RESISTANT Resistant     ERYTHROMYCIN <=0.25 SENSITIVE Sensitive     GENTAMICIN <=0.5 SENSITIVE Sensitive     OXACILLIN <=0.25 SENSITIVE Sensitive     TETRACYCLINE 2 SENSITIVE Sensitive     VANCOMYCIN <=0.5 SENSITIVE Sensitive     TRIMETH/SULFA 80 RESISTANT Resistant     CLINDAMYCIN <=0.25 SENSITIVE Sensitive     RIFAMPIN <=0.5 SENSITIVE Sensitive     Inducible Clindamycin NEGATIVE Sensitive     * STAPHYLOCOCCUS EPIDERMIDIS  Blood culture (routine x 2)     Status: Abnormal   Collection Time: 07/11/21  9:47 PM   Specimen: BLOOD  Result Value Ref Range Status   Specimen Description   Final    BLOOD RIGHT ASSIST CONTROL Performed at [facility], [address], [place], [place] [address]    Special Requests   Final    BOTTLES DRAWN AEROBIC AND ANAEROBIC Blood Culture results may not be optimal due to an excessive volume of blood received in culture bottles Performed at [facility], [address] [place], [place] [address]    Culture  Setup Time   Final    GRAM POSITIVE COCCI IN BOTH AEROBIC AND ANAEROBIC BOTTLES CRITICAL RESULT CALLED TO, READ BACK BY AND VERIFIED WITH: [place] [id number]  07/13/2021 GA Performed at [facility], [address] [place], [place] [address]    Culture (A)  Final    STAPHYLOCOCCUS EPIDERMIDIS SUSCEPTIBILITIES PERFORMED ON PREVIOUS CULTURE WITHIN THE LAST 5 DAYS. Performed at [facility], [address], [place], [place] [address]    Report Status 07/16/2021 FINAL  Final  Blood Culture ID Panel (Reflexed)     Status: Abnormal   Collection Time: 07/11/21  9:47 PM  Result Value Ref Range Status   Enterococcus faecalis NOT DETECTED NOT DETECTED Final   Enterococcus Faecium NOT DETECTED NOT DETECTED Final   Listeria monocytogenes NOT DETECTED NOT DETECTED Final   Staphylococcus species DETECTED (A) NOT DETECTED Final    Comment: CRITICAL RESULT CALLED  TO, READ BACK BY AND VERIFIED WITH: BRANDON BEERS AT 1332 07/13/21.PMF    Staphylococcus aureus (BCID) NOT DETECTED NOT DETECTED Final   Staphylococcus epidermidis DETECTED (A) NOT DETECTED Final    Comment: CRITICAL RESULT CALLED TO, READ BACK BY AND VERIFIED WITH: BRANDON BEERS AT 1332 07/13/21.PMF    Staphylococcus lugdunensis NOT DETECTED NOT DETECTED Final   Streptococcus species NOT DETECTED NOT DETECTED Final   Streptococcus agalactiae NOT DETECTED NOT DETECTED Final   Streptococcus pneumoniae NOT DETECTED NOT DETECTED Final   Streptococcus pyogenes NOT DETECTED NOT DETECTED Final   A.calcoaceticus-baumannii NOT DETECTED NOT DETECTED Final   Bacteroides fragilis NOT DETECTED NOT DETECTED Final   Enterobacterales NOT DETECTED NOT DETECTED Final   Enterobacter cloacae complex NOT DETECTED NOT DETECTED Final   Escherichia coli NOT DETECTED NOT DETECTED Final   Klebsiella aerogenes NOT DETECTED NOT DETECTED Final   Klebsiella oxytoca NOT DETECTED NOT DETECTED Final   Klebsiella pneumoniae NOT DETECTED NOT DETECTED Final   Proteus species NOT DETECTED NOT DETECTED Final   Salmonella species NOT DETECTED NOT DETECTED Final   Serratia marcescens NOT DETECTED NOT DETECTED Final   Haemophilus influenzae NOT DETECTED NOT DETECTED Final   Neisseria meningitidis NOT DETECTED NOT DETECTED Final   Pseudomonas aeruginosa NOT DETECTED NOT DETECTED Final   Stenotrophomonas maltophilia NOT DETECTED NOT DETECTED Final   Candida albicans NOT DETECTED NOT DETECTED Final   Candida auris NOT DETECTED NOT DETECTED Final   Candida glabrata NOT DETECTED NOT DETECTED Final   Candida krusei NOT DETECTED NOT DETECTED Final   Candida parapsilosis NOT DETECTED NOT DETECTED Final   Candida tropicalis NOT DETECTED NOT DETECTED Final   Cryptococcus neoformans/gattii NOT DETECTED NOT DETECTED Final   Methicillin resistance mecA/C NOT DETECTED NOT DETECTED Final    Comment: Performed at [facility],  [address], [place], [place] [address]  Urine Culture     Status: None   Collection Time: 07/11/21 11:08 PM   Specimen: Urine, Random  Result Value Ref Range Status   Specimen Description   Final    URINE, RANDOM Performed at [facility], [address] [place], [place] [address]    Special Requests   Final    NONE Performed at [facility], [address] [place], [place] [address]    Culture   Final    NO GROWTH Performed at [facility], [address], [place], [place] [address]    Report Status 07/12/2021 FINAL  Final  MRSA Next Gen by PCR, Nasal     Status: None   Collection Time: 07/12/21  2:56 AM   Specimen: Nasal Mucosa; Nasal Swab  Result Value Ref Range Status   MRSA by PCR Next Gen NOT  DETECTED NOT DETECTED Final    Comment: (NOTE) The GeneXpert MRSA Assay (FDA approved for NASAL specimens only), is one component of a comprehensive MRSA colonization surveillance program. It is not intended to diagnose MRSA infection nor to guide or monitor treatment for MRSA infections. Test performance is not FDA approved in patients less than 37 years old. Performed at [facility], [address] [place], [place] [address]   CULTURE, BLOOD (ROUTINE X 2) w Reflex to ID Panel     Status: None (Preliminary result)   Collection Time: 07/14/21  5:25 AM   Specimen: BLOOD  Result Value Ref Range Status   Specimen Description BLOOD BLOOD LEFT HAND  Final   Special Requests   Final    BOTTLES DRAWN AEROBIC AND ANAEROBIC Blood Culture adequate volume   Culture   Final    NO GROWTH 3 DAYS Performed at [facility], [address], [place], [place] [address]    Report Status PENDING  Incomplete  CULTURE, BLOOD (ROUTINE X 2) w Reflex to ID Panel     Status: None (Preliminary result)   Collection Time: 07/14/21  5:30 AM   Specimen: BLOOD  Result Value Ref Range Status   Specimen Description BLOOD BLOOD RIGHT HAND  Final   Special  Requests   Final    BOTTLES DRAWN AEROBIC AND ANAEROBIC Blood Culture adequate volume   Culture   Final    NO GROWTH 3 DAYS Performed at [facility], [address] [place], [place] [address]    Report Status PENDING  Incomplete     Time coordinating discharge: 40 minutes  SIGNED:   Elmarie Shiley, MD  [facility]

## 2021-07-19 ENCOUNTER — Telehealth: Payer: Self-pay | Admitting: Interventional Cardiology

## 2021-07-19 ENCOUNTER — Ambulatory Visit: Admit: 2021-07-19 | Payer: BC Managed Care – PPO | Admitting: Urology

## 2021-07-19 LAB — CULTURE, BLOOD (ROUTINE X 2)
Culture: NO GROWTH
Culture: NO GROWTH
Special Requests: ADEQUATE
Special Requests: ADEQUATE

## 2021-07-19 SURGERY — LITHOTRIPSY, ESWL
Anesthesia: Moderate Sedation | Laterality: Right

## 2021-07-19 NOTE — Telephone Encounter (Addendum)
Patient contacted regarding discharge from [facility] on January 17th 2023. Patient answered the phone and then hung up on RN, now not answering the phone. Left message with instructions to call us back!   Addendum 1/19 at 1640  Patient understands to follow up with provider Gillian Shields, NP  on 07/27/21  at 2:20pm at [facility]   Patient understands discharge instructions? YES   Patient understands medications and regiment?  Patient is concerned because they changed a lot of his medications around in the hospital so he just wants to ensure that he is taking them correctly    Patient understands to bring all medications to this visit? Yes patient is agreeable to bringing his medications with him   Ask patient:  Are you enrolled in My Chart- YES

## 2021-07-19 NOTE — Telephone Encounter (Signed)
I did not need this encounter please

## 2021-07-19 NOTE — Telephone Encounter (Signed)
TOC Patient     Please call Patient   Patient   have an appointment with Gillian Shields on 07-27-21

## 2021-07-26 ENCOUNTER — Ambulatory Visit
Admission: RE | Admit: 2021-07-26 | Discharge: 2021-07-26 | Disposition: A | Discharge status: Home / Self Care | Payer: BC Managed Care – PPO | Attending: Urology | Admitting: Urology

## 2021-07-26 ENCOUNTER — Other Ambulatory Visit: Payer: Self-pay

## 2021-07-26 ENCOUNTER — Encounter: Payer: Self-pay | Admitting: Registered Nurse

## 2021-07-26 ENCOUNTER — Encounter
Admission: RE | Disposition: A | Discharge status: Home / Self Care | Payer: Self-pay | Source: Home / Self Care | Attending: Urology

## 2021-07-26 ENCOUNTER — Encounter: Payer: Self-pay | Admitting: Urology

## 2021-07-26 DIAGNOSIS — N201 Calculus of ureter: Secondary | ICD-10-CM | POA: Diagnosis present

## 2021-07-26 HISTORY — PX: EXTRACORPOREAL SHOCK WAVE LITHOTRIPSY: SHX1557

## 2021-07-26 SURGERY — LITHOTRIPSY, ESWL
Anesthesia: Moderate Sedation | Laterality: Right

## 2021-07-26 MED ORDER — MIDAZOLAM HCL 2 MG/2ML IJ SOLN: 1.0000 mg | Freq: Once | INTRAMUSCULAR | Status: AC

## 2021-07-26 MED ORDER — DIPHENHYDRAMINE HCL 25 MG PO CAPS
ORAL_CAPSULE | ORAL | Status: AC
  Administered 2021-07-26: 25 mg via ORAL
  Filled 2021-07-26: qty 1

## 2021-07-26 MED ORDER — DIPHENHYDRAMINE HCL 25 MG PO CAPS: 25.0000 mg | ORAL_CAPSULE | Freq: Once | ORAL | Status: AC

## 2021-07-26 MED ORDER — MORPHINE SULFATE (PF) 10 MG/ML IV SOLN
INTRAVENOUS | Status: AC
  Administered 2021-07-26: 10 mg via INTRAMUSCULAR
  Filled 2021-07-26: qty 1

## 2021-07-26 MED ORDER — LEVOFLOXACIN 500 MG PO TABS: 500.0000 mg | ORAL_TABLET | Freq: Every day | ORAL | Status: DC

## 2021-07-26 MED ORDER — PROMETHAZINE HCL 25 MG/ML IJ SOLN: 25.0000 mg | Freq: Once | INTRAMUSCULAR | Status: AC

## 2021-07-26 MED ORDER — DEXTROSE-NACL 5-0.45 % IV SOLN: INTRAVENOUS | Status: DC

## 2021-07-26 MED ORDER — FUROSEMIDE 10 MG/ML IJ SOLN
INTRAMUSCULAR | Status: AC
  Filled 2021-07-26: qty 2

## 2021-07-26 MED ORDER — LEVOFLOXACIN 500 MG PO TABS
ORAL_TABLET | ORAL | Status: AC
  Administered 2021-07-26: 500 mg via ORAL
  Filled 2021-07-26: qty 1

## 2021-07-26 MED ORDER — MORPHINE SULFATE (PF) 10 MG/ML IV SOLN
10.0000 mg | Freq: Once | INTRAVENOUS | Status: AC
Start: 2021-07-26 — End: 2021-07-26

## 2021-07-26 MED ORDER — TAMSULOSIN HCL 0.4 MG PO CAPS: 0.4000 mg | ORAL_CAPSULE | Freq: Every day | ORAL | 11 refills | Status: DC

## 2021-07-26 MED ORDER — DOCUSATE SODIUM 100 MG PO CAPS: 200.0000 mg | ORAL_CAPSULE | Freq: Two times a day (BID) | ORAL | 3 refills | Status: DC

## 2021-07-26 MED ORDER — FUROSEMIDE 10 MG/ML IJ SOLN
10.0000 mg | Freq: Once | INTRAMUSCULAR | Status: AC
  Administered 2021-07-26: 10 mg via INTRAVENOUS

## 2021-07-26 MED ORDER — PROMETHAZINE HCL 25 MG/ML IJ SOLN
INTRAMUSCULAR | Status: AC
  Administered 2021-07-26: 25 mg via INTRAMUSCULAR
  Filled 2021-07-26: qty 1

## 2021-07-26 MED ORDER — MIDAZOLAM HCL 2 MG/2ML IJ SOLN
INTRAMUSCULAR | Status: AC
  Administered 2021-07-26: 1 mg via INTRAMUSCULAR
  Filled 2021-07-26: qty 2

## 2021-07-26 NOTE — Discharge Instructions (Addendum)
Lithotripsy, Care After This sheet gives you information about how to care for yourself after your procedure. Your health care provider may also give you more specific instructions. If you have problems or questions, contact your health care provider. What can I expect after the procedure? After the procedure, it is common to have: Some blood in your urine. This should only last for a few days. Soreness in your back, sides, or upper abdomen for a few days. Blotches or bruises on the area where the shock wave entered the skin. Pain, discomfort, or nausea when pieces (fragments) of the kidney stone move through the tube that carries urine from the kidney to the bladder (ureter). Stone fragments may pass soon after the procedure, but they may continue to pass for up to 4-8 weeks. If you have severe pain or nausea, contact your health care provider. This may be caused by a large stone that was not broken up, and this may mean that you need more treatment. Some pain or discomfort during urination. Some pain or discomfort in the lower abdomen or (in men) at the base of the penis. Follow these instructions at home: Medicines Take over-the-counter and prescription medicines only as told by your health care provider. If you were prescribed an antibiotic medicine, take it as told by your health care provider. Do not stop taking the antibiotic even if you start to feel better. Ask your health care provider if the medicine prescribed to you requires you to avoid driving or using machinery. Eating and drinking A comparison of three sample cups showing dark yellow, yellow, and pale yellow urine.    A plate with examples of foods in a healthy diet.  Drink enough fluid to keep your urine pale yellow. This helps any remaining pieces of the stone to pass. It can also help prevent new stones from forming. Eat plenty of fresh fruits and vegetables. Follow instructions from your health care provider about eating  or drinking restrictions. You may be instructed to: Reduce how much salt (sodium) you eat or drink. Check ingredients and nutrition facts on packaged foods and beverages to see how much sodium they contain. Reduce how much meat you eat. Eat the recommended amount of calcium for your age and gender. Ask your health care provider how much calcium you should have. General instructions Get plenty of rest. Return to your normal activities as told by your health care provider. Ask your health care provider what activities are safe for you. Most people can resume normal activities 1-2 days after the procedure. If you were given a sedative during the procedure, it can affect you for several hours. Do not drive or operate machinery until your health care provider says that it is safe. Your health care provider may direct you to lie in a certain position (postural drainage) and tap firmly (percuss) over your kidney area to help stone fragments pass. Follow instructions as told by your health care provider. If directed, strain all urine through the strainer that was provided by your health care provider. Keep all fragments for your health care provider to see. Any stones that are found may be sent to a medical lab for examination. The stone may be as small as a grain of salt. Keep all follow-up visits as told by your health care provider. This is important. Contact a health care provider if: You have a fever or chills. You have nausea that is severe or does not go away. You have any of these  urinary symptoms: Blood in your urine for longer than your health care provider told you to expect. Urine that smells bad or unusual. Feeling a strong urge to urinate after emptying your bladder. Pain or burning with urination that does not go away. Urinating more often than usual and this does not go away. You have a stent and it comes out. Get help right away if: You have severe pain in your back, sides, or upper  abdomen. You have any of these urinary symptoms: Severe pain while urinating. More blood in your urine or having blood in your urine when you did not before. Passing blood clots in your urine. Passing only a small amount of urine or being unable to pass any urine at all. You have severe nausea that leads to persistent vomiting. You faint. Summary After this procedure, it is common to have some pain, discomfort, or nausea when pieces (fragments) of the kidney stone move through the tube that carries urine from the kidney to the bladder (ureter). If this pain or nausea is severe, however, you should contact your health care provider. Return to your normal activities as told by your health care provider. Ask your health care provider what activities are safe for you. Drink enough fluid to keep your urine pale yellow. This helps any remaining pieces of the stone to pass, and it can help prevent new stones from forming. If directed, strain your urine and keep all fragments for your health care provider to see. Fragments or stones may be as small as a grain of salt. Get help right away if you have severe pain in your back, sides, or upper abdomen, or if you have severe pain while urinating. This information is not intended to replace advice given to you by your health care provider. Make sure you discuss any questions you have with your health care provider. Document Revised: 02/19/2021 Document Reviewed: 02/19/2021 Elsevier Patient Education  2022 [place]   The drugs that you were given will stay in your system until tomorrow so for the next 24 hours you should not:  Drive an automobile Make any legal decisions Drink any alcoholic beverage   You may resume regular meals tomorrow.  Today it is better to start with liquids and gradually work up to solid foods.  You may eat anything you prefer, but it is better to  start with liquids, then soup and crackers, and gradually work up to solid foods.   Please notify your doctor immediately if you have any unusual bleeding, trouble breathing, redness and pain at the surgery site, drainage, fever, or pain not relieved by medication.    Additional Instructions:     Please contact your physician with any problems or Same Day Surgery at [phone], Monday through Friday 6 am to 4 pm, or [place] at [facility] number at [phone].

## 2021-07-26 NOTE — Progress Notes (Signed)
Cardiology Office Note:    Date:  07/27/2021   ID:  [name] Walker, DOB December 26, 1955, MRN [name]  PCP:  Tracie Harrier, MD   [facility] Providers Cardiologist:  Sinclair Grooms, MD     Referring MD: Tracie Harrier, MD   Chief Complaint  Patient presents with   Follow-up  Post hospital f/u chronic HFrEF, PAF  History of Present Illness:    [name] Walker is a 65 y.o. male with a hx of CAD s/p anterior MI and LAD stenting in 2013, ICM, chronic HFrEF, HTN, hyperlipidemia, tobacco abuse, nephrolithiasis, and OSA.   He had cardiogenic shock following prolonged CPR in the setting of anterior STEMI in December 2013.  He underwent cardiac catheterization and was found to have an totally occluded LAD after the origin of the first diagonal in the proximal segment treated with bare metal stent. He had widely patent dominant circumflex and nondominant RCA. Subsequent echocardiogram revealed severely reduced LVEF at 20%, akinesis of the mid-distal anterior septal myocardium, akinesis of the entire apical myocardium.  He was referred to EP for consideration of ICD implant however this never occurred and he later verbalized that he was not interested in proceeding. Has had additional catheterizations in 2016 that revealed patent stent in LAD and nonobstructive coronary disease with continued medical therapy recommended.   He was admitted to [facility] 1/11-1/17/23 for sepsis 2/2 pyelonephritis with obstructing renal stone s/p right ureteral stent placement. On 07/13/21 he developed asymptomatic paroxysmal atrial fibrillation and flutter.  While in flutter his rates trended into the 150s. Cardiology was consulted and he was treated with amiodarone.  He had mild troponin elevation 57 ? 50 felt to be demand ischemia in the setting of pyelonephritis/septic shock/bacteremia and not ACS.  At discharge, he was maintaining SR on amiodarone and carvedilol was stopped due to persistent bradycardia.   Anticoagulation was held in the setting of isolated brief episode of atrial fibrillation with plans for lithotripsy. Consideration of ambulatory monitoring was discussed.  He was advised to continue Entresto.   Today, he is here alone for follow-up. Reports he is feeling well since hospital d/c on 1/17. He denies chest pain, shortness of breath, lower extremity edema, fatigue, melena, hematuria, hemoptysis, diaphoresis, weakness, presyncope, syncope, orthopnea, and PND. He denies palpitations, fluttering, or feelings of irregular HR. He is concerned about the medications that were stopped while he was hospitalized. He had lithotripsy yesterday and denies bleeding. He denies specific concerns today.   Past Medical History:  Diagnosis Date   Bronchitis    CHRONIC   Chronic HFrEF (heart failure with reduced ejection fraction) ([place])    a. 03/2021 Echo: EF 30-35%, GrI DD.   Coronary artery disease    a. 05/2012 Ant STEMI/PCI: LAD 100p (thrombectomy and 3.5x20 Veriflex BMS); b. 12/2014 Cath: patent stent; c. 03/2015 Cath: LM nl, LAD 10 ISR, 78m, D1 50p, LCX 20 diff throughout, RCA nl-->Med rx.   Erectile dysfunction    History of kidney stones    HOH (hard of hearing)    Hypercholesterolemia    Hypertension    Ischemic cardiomyopathy    a. 11/2015 Echo: EF 30-35%, diff HK; b. Refused ICD; c. 03/2021 Echo: EF 30-35%, GrI DD, nl RV fxn, mild MR.   MI (myocardial infarction) ([place]) 06/19/2012   being considered for defibilator but patient now refuses this.   Nephrolithiasis    NSTEMI (non-ST elevated myocardial infarction) ([place]) 03/2015   Patent BMS - LAD stent.   Sleep  apnea    Tobacco abuse    Varicose veins     Past Surgical History:  Procedure Laterality Date   CARDIAC CATHETERIZATION N/A 01/09/2015   Procedure: Left Heart Cath and Coronary Angiography;  Surgeon: Belva Crome, MD;  Location: [place] [facility];  Service: Cardiovascular;  Laterality: N/A;   CARDIAC CATHETERIZATION N/A  03/13/2015   Procedure: Left Heart Cath and Coronary Angiography;  Surgeon: Belva Crome, MD;  Location: [place] [facility];  Service: Cardiovascular;  Laterality: N/A;   CORONARY ANGIOPLASTY     STENT   CORONARY STENT PLACEMENT     CYSTOSCOPY W/ URETERAL STENT REMOVAL Right 05/13/2017   Procedure: CYSTOSCOPY WITH STENT REMOVAL;  Surgeon: Royston Cowper, MD;  Location: [facility];  Service: Urology;  Laterality: Right;   CYSTOSCOPY WITH STENT PLACEMENT Right 03/07/2015   Procedure: CYSTOSCOPY WITH STENT PLACEMENT;  Surgeon: Royston Cowper, MD;  Location: [facility];  Service: Urology;  Laterality: Right;   CYSTOSCOPY WITH STENT PLACEMENT Right 03/25/2017   Procedure: CYSTOSCOPY WITH STENT PLACEMENT;  Surgeon: Royston Cowper, MD;  Location: [facility];  Service: Urology;  Laterality: Right;   CYSTOSCOPY WITH STENT PLACEMENT Right 07/12/2021   Procedure: CYSTOSCOPY WITH STENT PLACEMENT;  Surgeon: Royston Cowper, MD;  Location: [facility];  Service: Urology;  Laterality: Right;   CYSTOSCOPY/RETROGRADE/URETEROSCOPY Right 02/24/2018   Procedure: CYSTOSCOPY/RETROGRADE/URETEROSCOPY;  Surgeon: Royston Cowper, MD;  Location: [facility];  Service: Urology;  Laterality: Right;   CYSTOSCOPY/RETROGRADE/URETEROSCOPY/STONE EXTRACTION WITH BASKET Right 03/25/2017   Procedure: CYSTOSCOPY/RETROGRADE/URETEROSCOPY/STONE EXTRACTION WITH BASKET;  Surgeon: Royston Cowper, MD;  Location: [facility];  Service: Urology;  Laterality: Right;   EXTRACORPOREAL SHOCK WAVE LITHOTRIPSY Right 03/09/2015   Procedure: EXTRACORPOREAL SHOCK WAVE LITHOTRIPSY (ESWL);  Surgeon: Royston Cowper, MD;  Location: [facility];  Service: Urology;  Laterality: Right;   EXTRACORPOREAL SHOCK WAVE LITHOTRIPSY Right 05/11/2015   Procedure: EXTRACORPOREAL SHOCK WAVE LITHOTRIPSY (ESWL);  Surgeon: Royston Cowper, MD;  Location: [facility];  Service: Urology;  Laterality: Right;   EXTRACORPOREAL SHOCK WAVE LITHOTRIPSY Right 04/10/2017   Procedure: EXTRACORPOREAL  SHOCK WAVE LITHOTRIPSY (ESWL);  Surgeon: Royston Cowper, MD;  Location: [facility];  Service: Urology;  Laterality: Right;   EXTRACORPOREAL SHOCK WAVE LITHOTRIPSY Right 07/26/2021   Procedure: EXTRACORPOREAL SHOCK WAVE LITHOTRIPSY (ESWL);  Surgeon: Royston Cowper, MD;  Location: [facility];  Service: Urology;  Laterality: Right;   LEFT HEART CATHETERIZATION WITH CORONARY ANGIOGRAM Right 06/19/2012   Procedure: LEFT HEART CATHETERIZATION WITH CORONARY ANGIOGRAM;  Surgeon: Sinclair Grooms, MD;  Location: [facility] CATH LAB;  Service: Cardiovascular;  Laterality: Right;   PERCUTANEOUS CORONARY STENT INTERVENTION (PCI-S) Right 06/19/2012   Procedure: PERCUTANEOUS CORONARY STENT INTERVENTION (PCI-S);  Surgeon: Sinclair Grooms, MD;  Location: [facility] C[facility] LAB;  Service: Cardiovascular;  Laterality: Right;   URETEROSCOPY WITH HOLMIUM LASER LITHOTRIPSY Right 02/24/2018   Procedure: URETEROSCOPY;  Surgeon: Royston Cowper, MD;  Location: [facility];  Service: Urology;  Laterality: Right;    Current Medications: Current Meds  Medication Sig   albuterol (VENTOLIN HFA) 108 (90 Base) MCG/ACT inhaler Inhale 2 puffs into the lungs every 6 (six) hours as needed for wheezing or shortness of breath.   amiodarone (PACERONE) 200 MG tablet Take 1 tablet (200 mg total) by mouth 2 (two) times daily for 14 days. Take amiodarone 200 mg Twice a day for 14 days then take 200 mg daily.   atorvastatin (LIPITOR) 20 MG tablet Take 1  tablet (20 mg total) by mouth at bedtime.   cephALEXin (KEFLEX) 500 MG capsule Take 1 capsule (500 mg total) by mouth 3 (three) times daily for 10 days.   docusate sodium (COLACE) 100 MG capsule Take 2 capsules (200 mg total) by mouth 2 (two) times daily.   ondansetron (ZOFRAN-ODT) 4 MG disintegrating tablet Take 1 tablet by mouth every 8 (eight) hours as needed for nausea.   oxyCODONE-acetaminophen (PERCOCET/ROXICET) 5-325 MG tablet Take 1 tablet by mouth every 4 (four) hours.   sacubitril-valsartan  (ENTRESTO) 24-26 MG Take 1 tablet by mouth 2 (two) times daily.     Allergies:   Nucynta [tapentadol] and Codeine   Social History   Socioeconomic History   Marital status: Single    Spouse name: Not on file   Number of children: Not on file   Years of education: Not on file   Highest education level: Not on file  Occupational History   Not on file  Tobacco Use   Smoking status: Every Day    Packs/day: 0.50    Years: 40.00    Pack years: 20.00    Types: Cigarettes   Smokeless tobacco: Never  Vaping Use   Vaping Use: Never used  Substance and Sexual Activity   Alcohol use: Yes    Alcohol/week: 0.0 standard drinks    Comment: occ   Drug use: No   Sexual activity: Not on file  Other Topics Concern   Not on file  Social History Narrative   Not on file   Social Determinants of Health   Financial Resource Strain: Not on file  Food Insecurity: Not on file  Transportation Needs: Not on file  Physical Activity: Not on file  Stress: Not on file  Social Connections: Not on file     Family History: The patient's family history includes Cancer in his father; Heart attack in his brother; Heart murmur in his mother.  ROS:   Please see the history of present illness.    All other systems reviewed and are negative.  Labs/Other Studies Reviewed:    The following studies were reviewed today:  Echo 10/22   Left Ventricle: Left ventricular ejection fraction, by estimation, is 30  to 35%. The left ventricle has moderately decreased function. The left  ventricle demonstrates regional wall motion abnormalities. Definity  contrast agent was given IV to delineate  the left ventricular endocardial borders. The left ventricular internal  cavity size was mildly to moderately dilated. There is no left ventricular  hypertrophy. Left ventricular diastolic parameters are consistent with  Grade I diastolic dysfunction (impaired relaxation).  LV Wall Scoring:  The mid and distal  anterior septum, apical anterior segment, apical  inferior segment, and apex are akinetic.  Right Ventricle: The right ventricular size is mildly enlarged. No  increase in right ventricular wall thickness. Right ventricular systolic  function is normal. Tricuspid regurgitation signal is inadequate for  assessing PA pressure.  Left Atrium: Left atrial size was normal in size.  Right Atrium: Right atrial size was normal in size.  Pericardium: Trivial pericardial effusion is present.  Mitral Valve: The mitral valve is grossly normal. Mild mitral valve  regurgitation. No evidence of mitral valve stenosis.  Tricuspid Valve: The tricuspid valve is grossly normal. Tricuspid valve  regurgitation is not demonstrated. No evidence of tricuspid stenosis.  Aortic Valve: The aortic valve is tricuspid. Aortic valve regurgitation is  not visualized. No aortic stenosis is present.  Pulmonic Valve: The pulmonic valve was  grossly normal. Pulmonic valve  regurgitation is trivial. No evidence of pulmonic stenosis.  Aorta: The aortic root and ascending aorta are structurally normal, with  no evidence of dilitation.  Venous: The right lower pulmonary vein is normal. The inferior vena cava  is normal in size with greater than 50% respiratory variability,  suggesting right atrial pressure of 3 mmHg.  IAS/Shunts: The atrial septum is grossly normal.    LHC 7/16  Ost LAD to Prox LAD lesion, 10% stenosed. The lesion was previously treated with a stent (unknown type)  Prox LAD to Mid LAD lesion, 35% stenosed. Ost Cx to Dist Cx lesion, 30% stenosed.   Severe left ventricular dysfunction with anterior wall akinesis/dyskinesis starting in the anterobasal and extending into the apical segment. Ejection fraction is 20-25%. Minimal luminal irregularities in the proximal LAD and proximal to mid circumflex. Coronary arteries otherwise normal. Widely patent proximal LAD stent.    RECOMMENDATIONS:  The patient will be  referred to EP for consideration of AICD.  Aggressive heart fear therapy.    [place] 9/16  The LAD stent is patent. The first diagonal that is partially jailed by the stent contains ostial 50% narrowing. The LAD distal to the stent contains eccentric 40% narrowing. The circumflex is dominant and contains luminal irregularities but no significant obstruction. The right coronary is nondominant. No significant obstruction is noted. Significant left ventricular systolic dysfunction with predominant anterior and apical severe hypokinesis/akinesis. EF 25-30%.  When compared to the images obtained in June 2016, no significant change has occurred.   Recommendations:   With continued hematuria, I would recommend continued aspirin only therapy rather than dual antiplatelet therapy. Discontinue IV heparin. Observation overnight and if no further chest discomfort, he'll be safe to discharge in a.m.   Recent Labs: 07/11/2021: ALT 15 07/12/2021: B Natriuretic Peptide 223.1 07/15/2021: TSH 0.361 07/16/2021: BUN 24; Creatinine, Ser 0.97; Magnesium 1.9; Potassium 3.7; Sodium 137 07/17/2021: Hemoglobin 13.0; Platelets 169  Recent Lipid Panel    Component Value Date/Time   CHOL 155 04/23/2021 1503   TRIG 147 04/23/2021 1503   HDL 40 04/23/2021 1503   CHOLHDL 3.9 04/23/2021 1503   CHOLHDL 3.5 03/12/2015 0420   VLDL 24 03/12/2015 0420   LDLCALC 89 04/23/2021 1503     Risk Assessment/Calculations:    CHA2DS2-VASc Score = 2  This indicates a 2.2% annual risk of stroke. The patient's score is based upon: CHF History: 1 HTN History: 1 Diabetes History: 0 Stroke History: 0 Vascular Disease History: 0 Age Score: 0 Gender Score: 0    Physical Exam:    VS:  BP 122/62    Pulse 60    Ht 5' 11.5" (1.816 m)    Wt 224 lb (101.6 kg)    SpO2 97%    BMI 30.81 kg/m     Wt Readings from Last 3 Encounters:  07/27/21 224 lb (101.6 kg)  07/26/21 230 lb 9.6 oz (104.6 kg)  07/17/21 230 lb 9.6 oz (104.6 kg)      GEN:  Well nourished, well developed in no acute distress HEENT: Normal NECK: No JVD; No carotid bruits CARDIAC: RRR, no murmurs, rubs, gallops RESPIRATORY:  Diminished breath sounds bilaterally with expiratory wheezing ABDOMEN: Soft, non-tender, non-distended MUSCULOSKELETAL:  No edema; No deformity. 2+ pedal pulses, equal bilaterally SKIN: Warm and dry NEUROLOGIC:  Alert and oriented x 3 PSYCHIATRIC:  Normal affect   EKG:  EKG is ordered today.  The ekg ordered today demonstrates sinus bradycardia at rate of 59  bpm, prior septal infarct  Diagnoses:    1. Chronic systolic heart failure ([place])   2. Tobacco abuse   3. Coronary artery disease involving native coronary artery of native heart without angina pectoris   4. Hyperlipidemia LDL goal <70   5. PAF (paroxysmal atrial fibrillation) ([place])   6. Essential hypertension   7. Ischemic cardiomyopathy    Assessment and Plan:     PAF: He is maintaining sinus rhythm today at 59 bpm.  He denies feelings of palpitations, fluttering, or other symptoms concerning for atrial fibrillation.  CHA2DS2-VASc score is 2, however anticoagulation was held during hospitalization in the setting of one brief episode of A. fib and due to pending lithotripsy. He is currently taking amiodarone 400 mg daily. On 08/01/21 he will reduce his dose to 200 mg daily and continue this dose until he sees Dr. Tamala Julian on April 4. Beta blocker on hold due to bradycardia during hospitalization. Could consider placement of cardiac monitor if a fib returns or if he develops symptoms concerning for a fib.   CAD without angina: He denies chest pain, dyspnea, or other symptoms concerning for angina.  States he is active at home with no concerns about activity tolerance.  His aspirin was stopped during hospitalization in the setting of urology procedures.  No concerns for bleeding today. We will have him resume aspirin.  Carvedilol was stopped in the setting of persistent bradycardia.   We will continue to hold while patient is on amiodarone with consideration to restart once he comes off amiodarone. Continue statin.   Chronic HFrEF/Ischemic cardiomyopathy: He denies dyspnea, PND, orthopnea. Mild bilateral lower extremity edema, he feels legs have returned to normal. Otherwise euvolemic on exam. Continue Entresto. Resume Farxiga at 5 mg as he reports he had dizziness with 10 mg.   Essential hypertension: Blood pressure is stable today.  He monitors blood pressure at home and reports similar readings.  He denies dizziness, lightheadedness, presyncope, syncope.  We will continue Entresto 24-26 mg twice daily. We will resume Farxiga at 5 mg which was his dose prior to hospitalization.  Encouraged him to continue to monitor SBP and report to [place] if it is consistently outside parameters of < 100 or > 130 mmHg.   Tobacco abuse: He continues to smoke 6 cigarettes/day.  He has diminished breath sounds bilaterally upon auscultation.  He has never seen a pulmonologist.  I will order a lung cancer screening CT today and if it reveals abnormalities will refer patient to pulmonology.  Hyperlipidemia LDL goal < 70: LDL 89 on 04/23/21. He is on atorvastatin 20 mg. He was previously on 40 mg daily. Would favor increasing the intensity of statin therapy. Will recheck at next office visit.    Disposition: 2 months with Dr. Tamala Julian     Medication Adjustments/Labs and Tests Ordered: Current medicines are reviewed at length with the patient today.  Concerns regarding medicines are outlined above.  Orders Placed This Encounter  Procedures   CT CHEST LUNG CA SCREEN LOW DOSE W/O CM   TSH   Basic metabolic panel   EKG [id number]   No orders of the defined types were placed in this encounter.   Patient Instructions  Medication Instructions:  Your physician has recommended you make the following change in your medication:   Resume:  Farxiga 5mg  (half tablet) daily   Continue: Amiodarone 200mg  twice  daily until 1/31 START:  On February 1st transition to 200mg  daily until your follow up appointment  *If  you need a refill on your cardiac medications before your next appointment, please call your pharmacy*   Lab Work: Your physician recommends that you return for lab work Today- TSH, BMET  If you have labs (blood work) drawn today and your tests are completely normal, you will receive your results only by: MyChart Message (if you have MyChart) OR A paper copy in the mail If you have any lab test that is abnormal or we need to change your treatment, we will call you to review the results.   Testing/Procedures: None ordered today    Follow-Up: At [facility], you and your health needs are our priority.  As part of our continuing mission to provide you with exceptional heart care, we have created designated Provider Care Teams.  These Care Teams include your primary Cardiologist (physician) and Advanced Practice Providers (APPs -  Physician Assistants and Nurse Practitioners) who all work together to provide you with the care you need, when you need it.  We recommend signing up for the patient portal called "MyChart".  Sign up information is provided on this After Visit Summary.  MyChart is used to connect with patients for Virtual Visits (Telemedicine).  Patients are able to view lab/test results, encounter notes, upcoming appointments, etc.  Non-urgent messages can be sent to your provider as well.   To learn more about what you can do with MyChart, go to [email or web address].    Your next appointment:   Follow up as scheduled with Dr. Tamala Julian     Signed, Jamielyn Petrucci, Lanice Schwab, NP  07/27/2021 3:09 PM    [place]

## 2021-07-27 ENCOUNTER — Ambulatory Visit (INDEPENDENT_AMBULATORY_CARE_PROVIDER_SITE_OTHER): Payer: BC Managed Care – PPO | Admitting: Nurse Practitioner

## 2021-07-27 ENCOUNTER — Encounter: Payer: Self-pay | Admitting: Urology

## 2021-07-27 VITALS — BP 122/62 | HR 60 | Ht 71.5 in | Wt 224.0 lb

## 2021-07-27 DIAGNOSIS — E785 Hyperlipidemia, unspecified: Secondary | ICD-10-CM

## 2021-07-27 DIAGNOSIS — I5022 Chronic systolic (congestive) heart failure: Secondary | ICD-10-CM | POA: Diagnosis not present

## 2021-07-27 DIAGNOSIS — I10 Essential (primary) hypertension: Secondary | ICD-10-CM

## 2021-07-27 DIAGNOSIS — I480 Paroxysmal atrial fibrillation: Secondary | ICD-10-CM

## 2021-07-27 DIAGNOSIS — I2510 Atherosclerotic heart disease of native coronary artery without angina pectoris: Secondary | ICD-10-CM | POA: Diagnosis not present

## 2021-07-27 DIAGNOSIS — Z720 Tobacco use: Secondary | ICD-10-CM | POA: Diagnosis not present

## 2021-07-27 DIAGNOSIS — I255 Ischemic cardiomyopathy: Secondary | ICD-10-CM

## 2021-07-27 NOTE — Patient Instructions (Signed)
Medication Instructions:  Your physician has recommended you make the following change in your medication:   Resume:  Farxiga 5mg  (half tablet) daily   Continue: Amiodarone 200mg  twice daily until 1/31 START:  On February 1st transition to 200mg  daily until your follow up appointment  *If you need a refill on your cardiac medications before your next appointment, please call your pharmacy*   Lab Work: Your physician recommends that you return for lab work Today- TSH, BMET  If you have labs (blood work) drawn today and your tests are completely normal, you will receive your results only by: MyChart Message (if you have MyChart) OR A paper copy in the mail If you have any lab test that is abnormal or we need to change your treatment, we will call you to review the results.   Testing/Procedures: None ordered today    Follow-Up: At [facility], you and your health needs are our priority.  As part of our continuing mission to provide you with exceptional heart care, we have created designated Provider Care Teams.  These Care Teams include your primary Cardiologist (physician) and Advanced Practice Providers (APPs -  Physician Assistants and Nurse Practitioners) who all work together to provide you with the care you need, when you need it.  We recommend signing up for the patient portal called "MyChart".  Sign up information is provided on this After Visit Summary.  MyChart is used to connect with patients for Virtual Visits (Telemedicine).  Patients are able to view lab/test results, encounter notes, upcoming appointments, etc.  Non-urgent messages can be sent to your provider as well.   To learn more about what you can do with MyChart, go to [id number].    Your next appointment:   Follow up as scheduled with Dr. 

## 2021-07-28 LAB — BASIC METABOLIC PANEL
BUN/Creatinine Ratio: 9 — ABNORMAL LOW (ref 10–24)
BUN: 9 mg/dL (ref 8–27)
CO2: 22 mmol/L (ref 20–29)
Calcium: 9.1 mg/dL (ref 8.6–10.2)
Chloride: 103 mmol/L (ref 96–106)
Creatinine, Ser: 1.05 mg/dL (ref 0.76–1.27)
Glucose: 78 mg/dL (ref 70–99)
Potassium: 3.8 mmol/L (ref 3.5–5.2)
Sodium: 141 mmol/L (ref 134–144)
eGFR: 79 mL/min/{1.73_m2} (ref >59)

## 2021-07-28 LAB — TSH: TSH: 1.14 u[IU]/mL (ref 0.450–4.500)

## 2021-07-28 LAB — SPECIMEN STATUS REPORT

## 2021-07-30 ENCOUNTER — Telehealth (HOSPITAL_BASED_OUTPATIENT_CLINIC_OR_DEPARTMENT_OTHER): Payer: Self-pay | Admitting: Nurse Practitioner

## 2021-07-30 ENCOUNTER — Encounter (HOSPITAL_BASED_OUTPATIENT_CLINIC_OR_DEPARTMENT_OTHER): Payer: Self-pay

## 2021-07-30 NOTE — Telephone Encounter (Signed)
Left message for patient regarding th Wednesday 08/08/21 10:15 CT chest scheduled at [facility]--[address], [address]---arrival time is 10:00 am for check in---also sent patient a My Chart message with the information---will also mail the information to patient.

## 2021-08-01 NOTE — Telephone Encounter (Signed)
Patient would like his chest CT moved to a later date, please!

## 2021-08-08 ENCOUNTER — Ambulatory Visit: Payer: BC Managed Care – PPO

## 2021-08-23 ENCOUNTER — Telehealth: Payer: Self-pay | Admitting: Interventional Cardiology

## 2021-08-23 NOTE — Telephone Encounter (Signed)
Pt states all of his heart medications were stopped when he was recently hospitalized.  Entresto, Atorvastatin and Marcelline Deist have been restarted and he was wondering if he should restart the Coreg.  Advised, per Jackelyn Knife note, BB was on hold for bradycardia in the hospital.  Inquired about pt's HR and he said he hasn't checked it but feels like it's a little faster.  Pt does have a way of checking his HR at home.  Advised pt to monitor HR daily for a week and send a MyChart message with those readings so we can see if we need to restart the Coreg for any reason.  Pt in agreement with plan.

## 2021-08-23 NOTE — Telephone Encounter (Signed)
° °  Pt c/o medication issue:  1. Name of Medication: carvedilol   2. How are you currently taking this medication (dosage and times per day)?   3. Are you having a reaction (difficulty breathing--STAT)?   4. What is your medication issue? Pt is calling and wanted to ask Dr. Katrinka Blazing if he can start carvedilol again

## 2021-08-29 ENCOUNTER — Inpatient Hospital Stay: Admission: RE | Admit: 2021-08-29 | Payer: BC Managed Care – PPO | Source: Ambulatory Visit

## 2021-09-19 ENCOUNTER — Ambulatory Visit (INDEPENDENT_AMBULATORY_CARE_PROVIDER_SITE_OTHER)
Admission: RE | Admit: 2021-09-19 | Discharge: 2021-09-19 | Disposition: A | Discharge status: Home / Self Care | Payer: BC Managed Care – PPO | Source: Ambulatory Visit | Attending: Nurse Practitioner | Admitting: Nurse Practitioner

## 2021-09-19 ENCOUNTER — Other Ambulatory Visit: Payer: Self-pay

## 2021-09-19 DIAGNOSIS — Z720 Tobacco use: Secondary | ICD-10-CM | POA: Diagnosis not present

## 2021-10-01 NOTE — Progress Notes (Signed)
?Cardiology Office Note:   ? ?Date:  10/02/2021  ? ?ID:  [name] Walker, DOB 03-Jun-1956, MRN [name] ? ?PCP:  Tracie Harrier, MD  ?Cardiologist:  [name] Grooms, MD  ? ?Referring MD: Tracie Harrier, MD  ? ?Chief Complaint  ?Patient presents with  ? Coronary Artery Disease  ? Congestive Heart Failure  ? Hyperlipidemia  ? ? ?History of Present Illness:   ? ?[name] Walker is a 66 y.o. male with a hx of CAD s/p anterior MI and LAD stenting in 2013, ICM, chronic HFrEF, refused ICD, HTN, hyperlipidemia, PAF, obacco abuse, nephrolithiasis, and OSA.  ? ?During an episode of sepsis in January 2023, he developed atrial fibrillation that was managed with amiodarone therapy.  Returning now to determine the course of amiodarone therapy and whether it can be discontinued.  Carvedilol was discontinued because of significant bradycardia on amiodarone.  He is now off amiodarone. ? ?Heart failure therapy includes Lisabeth Register, and was previously on carvedilol which was held because of amiodarone resulting in unacceptable bradycardia. ? ?He denies angina, orthopnea, peripheral edema, and significant palpitations.  Amiodarone has been discontinued without recurrence of atrial fibrillation.  He has baseline slow heart rate. ? ?Past Medical History:  ?Diagnosis Date  ? Bronchitis   ? CHRONIC  ? Chronic HFrEF (heart failure with reduced ejection fraction) ([name] Walker)   ? a. 03/2021 Echo: EF 30-35%, GrI DD.  ? Coronary artery disease   ? a. 05/2012 Ant STEMI/PCI: LAD 100p (thrombectomy and 3.5x20 Veriflex BMS); b. 12/2014 Cath: patent stent; c. 03/2015 Cath: LM nl, LAD 10 ISR, 107m, D1 50p, LCX 20 diff throughout, RCA nl-->Med rx.  ? Erectile dysfunction   ? History of kidney stones   ? HOH (hard of hearing)   ? Hypercholesterolemia   ? Hypertension   ? Ischemic cardiomyopathy   ? a. 11/2015 Echo: EF 30-35%, diff HK; b. Refused ICD; c. 03/2021 Echo: EF 30-35%, GrI DD, nl RV fxn, mild MR.  ? MI (myocardial infarction) ([name] Walker) 06/19/2012  ?  being considered for defibilator but patient now refuses this.  ? Nephrolithiasis   ? NSTEMI (non-ST elevated myocardial infarction) ([place]) 03/2015  ? Patent BMS - LAD stent.  ? Sleep apnea   ? Tobacco abuse   ? Varicose veins   ? ? ?Past Surgical History:  ?Procedure Laterality Date  ? CARDIAC CATHETERIZATION N/A 01/09/2015  ? Procedure: Left Heart Cath and Coronary Angiography;  Surgeon: Belva Crome, MD;  Location: [facility];  Service: Cardiovascular;  Laterality: N/A;  ? CARDIAC CATHETERIZATION N/A 03/13/2015  ? Procedure: Left Heart Cath and Coronary Angiography;  Surgeon: Belva Crome, MD;  Location: [facility];  Service: Cardiovascular;  Laterality: N/A;  ? CORONARY ANGIOPLASTY    ? STENT  ? CORONARY STENT PLACEMENT    ? CYSTOSCOPY W/ URETERAL STENT REMOVAL Right 05/13/2017  ? Procedure: CYSTOSCOPY WITH STENT REMOVAL;  Surgeon: Royston Cowper, MD;  Location: [facility];  Service: Urology;  Laterality: Right;  ? CYSTOSCOPY WITH STENT PLACEMENT Right 03/07/2015  ? Procedure: CYSTOSCOPY WITH STENT PLACEMENT;  Surgeon: Royston Cowper, MD;  Location: [facility];  Service: Urology;  Laterality: Right;  ? CYSTOSCOPY WITH STENT PLACEMENT Right 03/25/2017  ? Procedure: CYSTOSCOPY WITH STENT PLACEMENT;  Surgeon: Royston Cowper, MD;  Location: [facility];  Service: Urology;  Laterality: Right;  ? CYSTOSCOPY WITH STENT PLACEMENT Right 07/12/2021  ? Procedure: CYSTOSCOPY WITH STENT PLACEMENT;  Surgeon: Royston Cowper,  MD;  Location: [facility];  Service: Urology;  Laterality: Right;  ? CYSTOSCOPY/RETROGRADE/URETEROSCOPY Right 02/24/2018  ? Procedure: CYSTOSCOPY/RETROGRADE/URETEROSCOPY;  Surgeon: Royston Cowper, MD;  Location: [facility];  Service: Urology;  Laterality: Right;  ? CYSTOSCOPY/RETROGRADE/URETEROSCOPY/STONE EXTRACTION WITH BASKET Right 03/25/2017  ? Procedure: CYSTOSCOPY/RETROGRADE/URETEROSCOPY/STONE EXTRACTION WITH BASKET;  Surgeon: Royston Cowper, MD;  Location: [facility];  Service: Urology;   Laterality: Right;  ? EXTRACORPOREAL SHOCK WAVE LITHOTRIPSY Right 03/09/2015  ? Procedure: EXTRACORPOREAL SHOCK WAVE LITHOTRIPSY (ESWL);  Surgeon: Royston Cowper, MD;  Location: [facility];  Service: Urology;  Laterality: Right;  ? EXTRACORPOREAL SHOCK WAVE LITHOTRIPSY Right 05/11/2015  ? Procedure: EXTRACORPOREAL SHOCK WAVE LITHOTRIPSY (ESWL);  Surgeon: Royston Cowper, MD;  Location: [facility];  Service: Urology;  Laterality: Right;  ? EXTRACORPOREAL SHOCK WAVE LITHOTRIPSY Right 04/10/2017  ? Procedure: EXTRACORPOREAL SHOCK WAVE LITHOTRIPSY (ESWL);  Surgeon: Royston Cowper, MD;  Location: [facility];  Service: Urology;  Laterality: Right;  ? EXTRACORPOREAL SHOCK WAVE LITHOTRIPSY Right 07/26/2021  ? Procedure: EXTRACORPOREAL SHOCK WAVE LITHOTRIPSY (ESWL);  Surgeon: Royston Cowper, MD;  Location: [facility];  Service: Urology;  Laterality: Right;  ? LEFT HEART CATHETERIZATION WITH CORONARY ANGIOGRAM Right 06/19/2012  ? Procedure: LEFT HEART CATHETERIZATION WITH CORONARY ANGIOGRAM;  Surgeon: [name] Grooms, MD;  Location: [facility];  Service: Cardiovascular;  Laterality: Right;  ? PERCUTANEOUS CORONARY STENT INTERVENTION (PCI-S) Right 06/19/2012  ? Procedure: PERCUTANEOUS CORONARY STENT INTERVENTION (PCI-S);  Surgeon: [name] Grooms, MD;  Location: [facility];  Service: Cardiovascular;  Laterality: Right;  ? URETEROSCOPY WITH HOLMIUM LASER LITHOTRIPSY Right 02/24/2018  ? Procedure: URETEROSCOPY;  Surgeon: Royston Cowper, MD;  Location: [facility];  Service: Urology;  Laterality: Right;  ? ? ?Current Medications: ?Current Meds  ?Medication Sig  ? albuterol (VENTOLIN HFA) 108 (90 Base) MCG/ACT inhaler Inhale 2 puffs into the lungs every 6 (six) hours as needed for wheezing or shortness of breath.  ? aspirin EC 81 MG EC tablet Take 1 tablet (81 mg total) by mouth daily. Swallow whole.  ? atorvastatin (LIPITOR) 40 MG tablet Take 40 mg by mouth daily.  ? dapagliflozin propanediol (FARXIGA) 5 MG TABS tablet Take 5 mg by  mouth daily.  ? docusate sodium (COLACE) 100 MG capsule Take 2 capsules (200 mg total) by mouth 2 (two) times daily.  ? ondansetron (ZOFRAN-ODT) 4 MG disintegrating tablet Take 1 tablet by mouth every 8 (eight) hours as needed for nausea.  ? oxyCODONE-acetaminophen (PERCOCET/ROXICET) 5-325 MG tablet Take 1 tablet by mouth every 4 (four) hours.  ? sacubitril-valsartan (ENTRESTO) 24-26 MG Take 1 tablet by mouth 2 (two) times daily.  ? tamsulosin (FLOMAX) 0.4 MG CAPS capsule Take 0.4 mg by mouth.  ?  ? ?Allergies:   Nucynta [tapentadol] and Codeine  ? ?Social History  ? ?Socioeconomic History  ? Marital status: Single  ?  Spouse name: Not on file  ? Number of children: Not on file  ? Years of education: Not on file  ? Highest education level: Not on file  ?Occupational History  ? Not on file  ?Tobacco Use  ? Smoking status: Every Day  ?  Packs/day: 0.50  ?  Years: 40.00  ?  Pack years: 20.00  ?  Types: Cigarettes  ? Smokeless tobacco: Never  ?Vaping Use  ? Vaping Use: Never used  ?Substance and Sexual Activity  ? Alcohol use: Yes  ?  Alcohol/week: 0.0 standard drinks  ?  Comment: occ  ?  Drug use: No  ? Sexual activity: Not on file  ?Other Topics Concern  ? Not on file  ?Social History Narrative  ? Not on file  ? ?Social Determinants of Health  ? ?Financial Resource Strain: Not on file  ?Food Insecurity: Not on file  ?Transportation Needs: Not on file  ?Physical Activity: Not on file  ?Stress: Not on file  ?Social Connections: Not on file  ?  ? ?Family History: ?The patient's family history includes Cancer in his father; Heart attack in his brother; Heart murmur in his mother. ? ?ROS:   ?Please see the history of present illness.    ?He has some concerns about the interpretation of recent chest CT.  I notified him that the most alarming thing about the CT report was that the diagnosis of emphysema was made.  He has coronary calcification which we already understand.  All other systems reviewed and are  negative. ? ?EKGs/Labs/Other Studies Reviewed:   ? ?The following studies were reviewed today: ?04/23/2021 echocardiogram: ?IMPRESSIONS  ? ? ? 1. Left ventricular ejection fraction, by estimation, is 30 to 35%. The  ?left

## 2021-10-02 ENCOUNTER — Encounter: Payer: Self-pay | Admitting: Interventional Cardiology

## 2021-10-02 ENCOUNTER — Ambulatory Visit (INDEPENDENT_AMBULATORY_CARE_PROVIDER_SITE_OTHER): Payer: BC Managed Care – PPO | Admitting: Interventional Cardiology

## 2021-10-02 VITALS — BP 130/60 | HR 59 | Ht 71.5 in | Wt 217.6 lb

## 2021-10-02 DIAGNOSIS — I5022 Chronic systolic (congestive) heart failure: Secondary | ICD-10-CM | POA: Diagnosis not present

## 2021-10-02 DIAGNOSIS — E785 Hyperlipidemia, unspecified: Secondary | ICD-10-CM

## 2021-10-02 DIAGNOSIS — I480 Paroxysmal atrial fibrillation: Secondary | ICD-10-CM

## 2021-10-02 DIAGNOSIS — I10 Essential (primary) hypertension: Secondary | ICD-10-CM | POA: Diagnosis not present

## 2021-10-02 DIAGNOSIS — Z720 Tobacco use: Secondary | ICD-10-CM

## 2021-10-02 DIAGNOSIS — I2510 Atherosclerotic heart disease of native coronary artery without angina pectoris: Secondary | ICD-10-CM | POA: Diagnosis not present

## 2021-10-02 MED ORDER — CHANTIX STARTING MONTH PAK 0.5 MG X 11 & 1 MG X 42 PO TBPK: 0.5000 mg | ORAL_TABLET | Freq: Two times a day (BID) | ORAL | 0 refills | Status: DC

## 2021-10-02 MED ORDER — CARVEDILOL 6.25 MG PO TABS: 6.2500 mg | ORAL_TABLET | Freq: Two times a day (BID) | ORAL | 3 refills | Status: DC

## 2021-10-02 NOTE — Patient Instructions (Signed)
Medication Instructions:  ?1) START Carvedilol 6.25mg  twice daily ?2) START Chantix as follows: ?0.5mg  once daily day 1-3 ?0.5mg  twice daily day 4-7 ?1mg  twice daily until doses are completed ? ?*If you need a refill on your cardiac medications before your next appointment, please call your pharmacy* ? ? ?Lab Work: ?None ?If you have labs (blood work) drawn today and your tests are completely normal, you will receive your results only by: ?MyChart Message (if you have MyChart) OR ?A paper copy in the mail ?If you have any lab test that is abnormal or we need to change your treatment, we will call you to review the results. ? ? ?Testing/Procedures: ?None ? ? ?Follow-Up: ?At [facility], you and your health needs are our priority.  As part of our continuing mission to provide you with exceptional heart care, we have created designated Provider Care Teams.  These Care Teams include your primary Cardiologist (physician) and Advanced Practice Providers (APPs -  Physician Assistants and Nurse Practitioners) who all work together to provide you with the care you need, when you need it. ? ?We recommend signing up for the patient portal called "MyChart".  Sign up information is provided on this After Visit Summary.  MyChart is used to connect with patients for Virtual Visits (Telemedicine).  Patients are able to view lab/test results, encounter notes, upcoming appointments, etc.  Non-urgent messages can be sent to your provider as well.   ?To learn more about what you can do with MyChart, go to [facility].   ? ?Your next appointment:   ?6 month(s) ? ?The format for your next appointment:   ?In Person ? ?Provider:   ?[email or web address], MD  ? ? ?Other Instructions ?  ?

## 2021-10-03 ENCOUNTER — Other Ambulatory Visit: Payer: Self-pay | Admitting: *Deleted

## 2021-10-03 MED ORDER — CHANTIX STARTING MONTH PAK 0.5 MG X 11 & 1 MG X 42 PO TBPK: 0.5000 mg | ORAL_TABLET | Freq: Two times a day (BID) | ORAL | 0 refills | Status: DC

## 2021-10-09 ENCOUNTER — Other Ambulatory Visit: Payer: Self-pay | Admitting: *Deleted

## 2021-10-15 ENCOUNTER — Other Ambulatory Visit: Payer: Self-pay

## 2021-10-15 MED ORDER — CARVEDILOL 6.25 MG PO TABS: 6.2500 mg | ORAL_TABLET | Freq: Two times a day (BID) | ORAL | 3 refills | Status: DC

## 2021-10-15 MED ORDER — ATORVASTATIN CALCIUM 40 MG PO TABS: 40.0000 mg | ORAL_TABLET | Freq: Every day | ORAL | 1 refills | Status: DC

## 2021-10-23 ENCOUNTER — Other Ambulatory Visit: Payer: Self-pay

## 2021-10-23 NOTE — Telephone Encounter (Signed)
Pt's pharmacy is requesting a refill on varenicline tartrate (Chantix). Would Dr. Katrinka Blazing like to refill this medication? Please address ?

## 2021-10-24 MED ORDER — VARENICLINE TARTRATE 1 MG PO TABS: 1.0000 mg | ORAL_TABLET | Freq: Two times a day (BID) | ORAL | 1 refills | Status: DC

## 2021-10-24 NOTE — Telephone Encounter (Signed)
He would need the Chantix continuing pack sent in (can order that way and the higher dose should automatically show up in the rx). Typically continue for at least 12 weeks in total duration and can consider extending further if needed. ?

## 2021-10-26 ENCOUNTER — Other Ambulatory Visit: Payer: Self-pay

## 2021-10-26 ENCOUNTER — Other Ambulatory Visit: Payer: Self-pay | Admitting: Interventional Cardiology

## 2021-10-26 MED ORDER — CARVEDILOL 6.25 MG PO TABS: 6.2500 mg | ORAL_TABLET | Freq: Two times a day (BID) | ORAL | 3 refills | Status: DC

## 2021-10-26 MED ORDER — VARENICLINE TARTRATE 1 MG PO TABS: 1.0000 mg | ORAL_TABLET | Freq: Two times a day (BID) | ORAL | 2 refills | Status: DC

## 2021-10-26 MED ORDER — DAPAGLIFLOZIN PROPANEDIOL 5 MG PO TABS: 5.0000 mg | ORAL_TABLET | Freq: Every day | ORAL | 3 refills | Status: DC

## 2021-10-26 MED ORDER — ATORVASTATIN CALCIUM 40 MG PO TABS: 40.0000 mg | ORAL_TABLET | Freq: Every day | ORAL | 3 refills | Status: DC

## 2021-10-26 NOTE — Progress Notes (Signed)
Received fax from [place] stating there is a recall on Chantix Continuing Pk, alternative is Varenicline 1mg  tablets. ? ?OK per Dr. Tamala Julian. ?

## 2021-11-06 ENCOUNTER — Other Ambulatory Visit: Payer: Self-pay | Admitting: Pharmacist

## 2021-11-06 MED ORDER — DAPAGLIFLOZIN PROPANEDIOL 10 MG PO TABS: 10.0000 mg | ORAL_TABLET | Freq: Every day | ORAL | 3 refills | Status: DC

## 2022-01-15 ENCOUNTER — Other Ambulatory Visit: Payer: Self-pay

## 2022-01-15 MED ORDER — ENTRESTO 24-26 MG PO TABS: 1.0000 | ORAL_TABLET | Freq: Two times a day (BID) | ORAL | 2 refills | Status: DC

## 2022-04-03 NOTE — Progress Notes (Unsigned)
Cardiology Office Note:    Date:  04/04/2022   ID:  DEJAY SOOKDEO, DOB 1955/09/24, MRN [id number]  PCP:  Tracie Harrier, MD  Cardiologist:  Sinclair Grooms, MD   Referring MD: Tracie Harrier, MD   Chief Complaint  Patient presents with   Coronary Artery Disease   Congestive Heart Failure    History of Present Illness:    [name] Walker is a 66 y.o. male with a hx of CAD s/p anterior MI and LAD stenting in 2013, ICM, chronic HFrEF, refused ICD, HTN, hyperlipidemia, PAF, tobacco abuse, nephrolithiasis, and OSA.   He is working every day without limitations.  He denies angina, orthopnea, lower extremity swelling, palpitations, syncope, claudication, transient neurological symptoms, and medication side effects (he independently decreased Farxiga from 10 to 5 mg because the higher dose caused him to feel dizzy and weak).  He is able to lie flat.   Past Medical History:  Diagnosis Date   Bronchitis    CHRONIC   Chronic HFrEF (heart failure with reduced ejection fraction) ([place])    a. 03/2021 Echo: EF 30-35%, GrI DD.   Coronary artery disease    a. 05/2012 Ant STEMI/PCI: LAD 100p (thrombectomy and 3.5x20 Veriflex BMS); b. 12/2014 Cath: patent stent; c. 03/2015 Cath: LM nl, LAD 10 ISR, 49m, D1 50p, LCX 20 diff throughout, RCA nl-->Med rx.   Erectile dysfunction    History of kidney stones    HOH (hard of hearing)    Hypercholesterolemia    Hypertension    Ischemic cardiomyopathy    a. 11/2015 Echo: EF 30-35%, diff HK; b. Refused ICD; c. 03/2021 Echo: EF 30-35%, GrI DD, nl RV fxn, mild MR.   MI (myocardial infarction) ([place]) 06/19/2012   being considered for defibilator but patient now refuses this.   Nephrolithiasis    NSTEMI (non-ST elevated myocardial infarction) ([place]) 03/2015   Patent BMS - LAD stent.   Sleep apnea    Tobacco abuse    Varicose veins     Past Surgical History:  Procedure Laterality Date   CARDIAC CATHETERIZATION N/A 01/09/2015   Procedure: Left Heart  Cath and Coronary Angiography;  Surgeon: Belva Crome, MD;  Location: [place] [facility];  Service: Cardiovascular;  Laterality: N/A;   CARDIAC CATHETERIZATION N/A 03/13/2015   Procedure: Left Heart Cath and Coronary Angiography;  Surgeon: Belva Crome, MD;  Location: [place] [facility];  Service: Cardiovascular;  Laterality: N/A;   CORONARY ANGIOPLASTY     STENT   CORONARY STENT PLACEMENT     CYSTOSCOPY W/ URETERAL STENT REMOVAL Right 05/13/2017   Procedure: CYSTOSCOPY WITH STENT REMOVAL;  Surgeon: Royston Cowper, MD;  Location: [facility];  Service: Urology;  Laterality: Right;   CYSTOSCOPY WITH STENT PLACEMENT Right 03/07/2015   Procedure: CYSTOSCOPY WITH STENT PLACEMENT;  Surgeon: Royston Cowper, MD;  Location: [facility];  Service: Urology;  Laterality: Right;   CYSTOSCOPY WITH STENT PLACEMENT Right 03/25/2017   Procedure: CYSTOSCOPY WITH STENT PLACEMENT;  Surgeon: Royston Cowper, MD;  Location: [facility];  Service: Urology;  Laterality: Right;   CYSTOSCOPY WITH STENT PLACEMENT Right 07/12/2021   Procedure: CYSTOSCOPY WITH STENT PLACEMENT;  Surgeon: Royston Cowper, MD;  Location: [facility];  Service: Urology;  Laterality: Right;   CYSTOSCOPY/RETROGRADE/URETEROSCOPY Right 02/24/2018   Procedure: CYSTOSCOPY/RETROGRADE/URETEROSCOPY;  Surgeon: Royston Cowper, MD;  Location: [facility];  Service: Urology;  Laterality: Right;   CYSTOSCOPY/RETROGRADE/URETEROSCOPY/STONE EXTRACTION WITH BASKET Right 03/25/2017   Procedure: CYSTOSCOPY/RETROGRADE/URETEROSCOPY/STONE EXTRACTION  WITH BASKET;  Surgeon: Royston Cowper, MD;  Location: [facility];  Service: Urology;  Laterality: Right;   EXTRACORPOREAL SHOCK WAVE LITHOTRIPSY Right 03/09/2015   Procedure: EXTRACORPOREAL SHOCK WAVE LITHOTRIPSY (ESWL);  Surgeon: Royston Cowper, MD;  Location: [facility];  Service: Urology;  Laterality: Right;   EXTRACORPOREAL SHOCK WAVE LITHOTRIPSY Right 05/11/2015   Procedure: EXTRACORPOREAL SHOCK WAVE LITHOTRIPSY (ESWL);  Surgeon:  Royston Cowper, MD;  Location: [facility];  Service: Urology;  Laterality: Right;   EXTRACORPOREAL SHOCK WAVE LITHOTRIPSY Right 04/10/2017   Procedure: EXTRACORPOREAL SHOCK WAVE LITHOTRIPSY (ESWL);  Surgeon: Royston Cowper, MD;  Location: [facility];  Service: Urology;  Laterality: Right;   EXTRACORPOREAL SHOCK WAVE LITHOTRIPSY Right 07/26/2021   Procedure: EXTRACORPOREAL SHOCK WAVE LITHOTRIPSY (ESWL);  Surgeon: Royston Cowper, MD;  Location: [facility];  Service: Urology;  Laterality: Right;   LEFT HEART CATHETERIZATION WITH CORONARY ANGIOGRAM Right 06/19/2012   Procedure: LEFT HEART CATHETERIZATION WITH CORONARY ANGIOGRAM;  Surgeon: Sinclair Grooms, MD;  Location: [facility];  Service: Cardiovascular;  Laterality: Right;   PERCUTANEOUS CORONARY STENT INTERVENTION (PCI-S) Right 06/19/2012   Procedure: PERCUTANEOUS CORONARY STENT INTERVENTION (PCI-S);  Surgeon: Sinclair Grooms, MD;  Location: [facility];  Service: Cardiovascular;  Laterality: Right;   URETEROSCOPY WITH HOLMIUM LASER LITHOTRIPSY Right 02/24/2018   Procedure: URETEROSCOPY;  Surgeon: Royston Cowper, MD;  Location: [facility];  Service: Urology;  Laterality: Right;    Current Medications: Current Meds  Medication Sig   albuterol (VENTOLIN HFA) 108 (90 Base) MCG/ACT inhaler Inhale 2 puffs into the lungs every 6 (six) hours as needed for wheezing or shortness of breath.   aspirin EC 81 MG EC tablet Take 1 tablet (81 mg total) by mouth daily. Swallow whole.   atorvastatin (LIPITOR) 40 MG tablet Take 1 tablet (40 mg total) by mouth daily.   carvedilol (COREG) 6.25 MG tablet Take 1 tablet (6.25 mg total) by mouth 2 (two) times daily.   dapagliflozin propanediol (FARXIGA) 10 MG TABS tablet Take 1 tablet (10 mg total) by mouth daily before breakfast.   methocarbamol (ROBAXIN) 500 MG tablet Take 500 mg by mouth as needed.   methylPREDNISolone (MEDROL DOSEPAK) 4 MG TBPK tablet Take 4 mg by mouth daily at 6 (six) AM.    sacubitril-valsartan (ENTRESTO) 24-26 MG Take 1 tablet by mouth 2 (two) times daily.   tamsulosin (FLOMAX) 0.4 MG CAPS capsule Take 0.4 mg by mouth.   varenicline (CHANTIX) 1 MG tablet Take 1 tablet (1 mg total) by mouth 2 (two) times daily.     Allergies:   Nucynta [tapentadol] and Codeine   Social History   Socioeconomic History   Marital status: Single    Spouse name: Not on file   Number of children: Not on file   Years of education: Not on file   Highest education level: Not on file  Occupational History   Not on file  Tobacco Use   Smoking status: Every Day    Packs/day: 0.50    Years: 40.00    Total pack years: 20.00    Types: Cigarettes   Smokeless tobacco: Never  Vaping Use   Vaping Use: Never used  Substance and Sexual Activity   Alcohol use: Yes    Alcohol/week: 0.0 standard drinks of alcohol    Comment: occ   Drug use: No   Sexual activity: Not on file  Other Topics Concern   Not on file  Social History Narrative  Not on file   Social Determinants of Health   Financial Resource Strain: Not on file  Food Insecurity: Not on file  Transportation Needs: Not on file  Physical Activity: Not on file  Stress: Not on file  Social Connections: Not on file     Family History: The patient's family history includes Cancer in his father; Heart attack in his brother; Heart murmur in his mother.  ROS:   Please see the history of present illness.    Feels well.  No particular complaints.  All other systems reviewed and are negative.  EKGs/Labs/Other Studies Reviewed:    The following studies were reviewed today:  2 D Echocardiogram 03/2021 IMPRESSIONS   1. Left ventricular ejection fraction, by estimation, is 30 to 35%. The  left ventricle has moderately decreased function. The left ventricle  demonstrates regional wall motion abnormalities (see scoring  diagram/findings for description). The left  ventricular internal cavity size was mildly to moderately  dilated. Left  ventricular diastolic parameters are consistent with Grade I diastolic  dysfunction (impaired relaxation).   2. Right ventricular systolic function is normal. The right ventricular  size is mildly enlarged. Tricuspid regurgitation signal is inadequate for  assessing PA pressure.   3. The mitral valve is grossly normal. Mild mitral valve regurgitation.  No evidence of mitral stenosis.   4. The aortic valve is tricuspid. Aortic valve regurgitation is not  visualized. No aortic stenosis is present.   5. The inferior vena cava is normal in size with greater than 50%  respiratory variability, suggesting right atrial pressure of 3 mmHg.   Comparison(s): Prior images unable to be directly viewed, comparison made  by report only. No significant change from prior study.   EKG:  EKG no new data.  In January 2023 the EKG demonstrated septal infarction with flattened T waves.  Recent Labs: 07/11/2021: ALT 15 07/12/2021: B Natriuretic Peptide 223.1 07/16/2021: Magnesium 1.9 07/17/2021: Hemoglobin 13.0; Platelets 169 07/27/2021: BUN 9; Creatinine, Ser 1.05; Potassium 3.8; Sodium 141; TSH 1.140  Recent Lipid Panel    Component Value Date/Time   CHOL 155 04/23/2021 1503   TRIG 147 04/23/2021 1503   HDL 40 04/23/2021 1503   CHOLHDL 3.9 04/23/2021 1503   CHOLHDL 3.5 03/12/2015 0420   VLDL 24 03/12/2015 0420   LDLCALC 89 04/23/2021 1503    Physical Exam:    VS:  BP 102/60   Pulse 62   Ht 5' 11.5" (1.816 m)   Wt 205 lb (93 kg)   SpO2 94%   BMI 28.19 kg/m     Wt Readings from Last 3 Encounters:  04/04/22 205 lb (93 kg)  10/02/21 217 lb 9.6 oz (98.7 kg)  07/27/21 224 lb (101.6 kg)     GEN: Healthy appearing.  He has lost 13 pounds since January.. No acute distress HEENT: Normal NECK: No JVD. LYMPHATICS: No lymphadenopathy CARDIAC: No murmur. RRR S4 but no S3 gallop, or edema. VASCULAR:  Normal Pulses. No bruits. RESPIRATORY:  Clear to auscultation without rales, wheezing  or rhonchi  ABDOMEN: Soft, non-tender, non-distended, No pulsatile mass, MUSCULOSKELETAL: No deformity  SKIN: Warm and dry NEUROLOGIC:  Alert and oriented x 3 PSYCHIATRIC:  Normal affect   ASSESSMENT:    1. Chronic systolic heart failure ([place])   2. Coronary artery disease involving native coronary artery of native heart without angina pectoris   3. Essential hypertension   4. Hyperlipidemia, unspecified hyperlipidemia type   5. Tobacco abuse    PLAN:  In order of problems listed above:  Current therapy includes Farxiga 5 mg/day, carvedilol 6.25 mg twice daily, and Entresto 24/26 mg p.o. twice daily.  He was on 10 mg of Farxiga but felt tired and weak all the time and on his own decrease the dose to 5 mg daily.  We will get a 2D Doppler echocardiogram to see if LV function has further improved or is stable. Secondary prevention discussed. Continue on heart failure regimen.  This also controls his blood pressure.  Actually at relatively low blood pressures have been a reason for limitation of heart failure therapy up titration.  Never been able to tolerate MRA therapy. Continue atorvastatin 40 mg/day. He is stop smoking.   Continue preventive therapy for ischemic heart disease and guideline directed medical therapy for systolic heart failure.  2D Doppler echocardiogram to be done soon.  Recheck lipid panel, kidney panel, and potassium level.   Medication Adjustments/Labs and Tests Ordered: Current medicines are reviewed at length with the patient today.  Concerns regarding medicines are outlined above.  Orders Placed This Encounter  Procedures   Lipid panel   Hemoglobin A1c   Comprehensive metabolic panel   ECHOCARDIOGRAM COMPLETE   No orders of the defined types were placed in this encounter.   Patient Instructions  Medication Instructions:  Your physician recommends that you continue on your current medications as directed. Please refer to the Current Medication list  given to you today.  *If you need a refill on your cardiac medications before your next appointment, please call your pharmacy*  Lab Work: Same day as echocardiogram appointment: fasting Lipid panel, CMET, Hgb A1c. If you have labs (blood work) drawn today and your tests are completely normal, you will receive your results only by: [place] (if you have MyChart) OR A paper copy in the mail If you have any lab test that is abnormal or we need to change your treatment, we will call you to review the results.  Testing/Procedures: Your physician has requested that you have an echocardiogram in October/November 2023. Echocardiography is a painless test that uses sound waves to create images of your heart. It provides your doctor with information about the size and shape of your heart and how well your heart's chambers and valves are working. This procedure takes approximately one hour. There are no restrictions for this procedure.  Follow-Up: At [facility], you and your health needs are our priority.  As part of our continuing mission to provide you with exceptional heart care, we have created designated Provider Care Teams.  These Care Teams include your primary Cardiologist (physician) and Advanced Practice Providers (APPs -  Physician Assistants and Nurse Practitioners) who all work together to provide you with the care you need, when you need it.  Your next appointment:   1 year(s)  The format for your next appointment:   In Person  Provider:   Sinclair Grooms, MD    Important Information About Sugar         Signed, Sinclair Grooms, MD  04/04/2022 2:09 PM    [place]

## 2022-04-04 ENCOUNTER — Ambulatory Visit: Payer: BC Managed Care – PPO | Attending: Interventional Cardiology | Admitting: Interventional Cardiology

## 2022-04-04 ENCOUNTER — Encounter: Payer: Self-pay | Admitting: Interventional Cardiology

## 2022-04-04 VITALS — BP 102/60 | HR 62 | Ht 71.5 in | Wt 205.0 lb

## 2022-04-04 DIAGNOSIS — I10 Essential (primary) hypertension: Secondary | ICD-10-CM

## 2022-04-04 DIAGNOSIS — E785 Hyperlipidemia, unspecified: Secondary | ICD-10-CM | POA: Diagnosis not present

## 2022-04-04 DIAGNOSIS — I2510 Atherosclerotic heart disease of native coronary artery without angina pectoris: Secondary | ICD-10-CM | POA: Diagnosis not present

## 2022-04-04 DIAGNOSIS — Z720 Tobacco use: Secondary | ICD-10-CM

## 2022-04-04 DIAGNOSIS — I5022 Chronic systolic (congestive) heart failure: Secondary | ICD-10-CM | POA: Diagnosis not present

## 2022-04-04 DIAGNOSIS — I480 Paroxysmal atrial fibrillation: Secondary | ICD-10-CM

## 2022-04-04 NOTE — Patient Instructions (Signed)
Medication Instructions:  Your physician recommends that you continue on your current medications as directed. Please refer to the Current Medication list given to you today.  *If you need a refill on your cardiac medications before your next appointment, please call your pharmacy*  Lab Work: Same day as echocardiogram appointment: fasting Lipid panel, CMET, Hgb A1c. If you have labs (blood work) drawn today and your tests are completely normal, you will receive your results only by: [place] (if you have MyChart) OR A paper copy in the mail If you have any lab test that is abnormal or we need to change your treatment, we will call you to review the results.  Testing/Procedures: Your physician has requested that you have an echocardiogram in October/November 2023. Echocardiography is a painless test that uses sound waves to create images of your heart. It provides your doctor with information about the size and shape of your heart and how well your heart's chambers and valves are working. This procedure takes approximately one hour. There are no restrictions for this procedure.  Follow-Up: At [facility], you and your health needs are our priority.  As part of our continuing mission to provide you with exceptional heart care, we have created designated Provider Care Teams.  These Care Teams include your primary Cardiologist (physician) and Advanced Practice Providers (APPs -  Physician Assistants and Nurse Practitioners) who all work together to provide you with the care you need, when you need it.  Your next appointment:   1 year(s)  The format for your next appointment:   In Person  Provider:   Sinclair Grooms, MD    Important Information About Sugar

## 2022-05-09 ENCOUNTER — Ambulatory Visit (HOSPITAL_COMMUNITY): Payer: BC Managed Care – PPO | Attending: Interventional Cardiology

## 2022-05-09 ENCOUNTER — Ambulatory Visit: Payer: BC Managed Care – PPO

## 2022-05-09 DIAGNOSIS — E785 Hyperlipidemia, unspecified: Secondary | ICD-10-CM

## 2022-05-09 DIAGNOSIS — I2510 Atherosclerotic heart disease of native coronary artery without angina pectoris: Secondary | ICD-10-CM | POA: Insufficient documentation

## 2022-05-09 DIAGNOSIS — I5022 Chronic systolic (congestive) heart failure: Secondary | ICD-10-CM | POA: Diagnosis present

## 2022-05-09 LAB — ECHOCARDIOGRAM COMPLETE
Area-P 1/2: 2.79 cm2
S' Lateral: 4.1 cm

## 2022-05-09 MED ORDER — PERFLUTREN LIPID MICROSPHERE
1.0000 mL | INTRAVENOUS | Status: AC | PRN
  Administered 2022-05-09: 2 mL via INTRAVENOUS

## 2022-05-10 ENCOUNTER — Telehealth: Payer: Self-pay

## 2022-05-10 LAB — COMPREHENSIVE METABOLIC PANEL
ALT: 10 IU/L (ref 0–44)
AST: 11 IU/L (ref 0–40)
Albumin/Globulin Ratio: 2 (ref 1.2–2.2)
Albumin: 4.3 g/dL (ref 3.9–4.9)
Alkaline Phosphatase: 64 IU/L (ref 44–121)
BUN/Creatinine Ratio: 18 (ref 10–24)
BUN: 17 mg/dL (ref 8–27)
Bilirubin Total: 1 mg/dL (ref 0.0–1.2)
CO2: 24 mmol/L (ref 20–29)
Calcium: 9.3 mg/dL (ref 8.6–10.2)
Chloride: 105 mmol/L (ref 96–106)
Creatinine, Ser: 0.95 mg/dL (ref 0.76–1.27)
Globulin, Total: 2.1 g/dL (ref 1.5–4.5)
Glucose: 103 mg/dL — ABNORMAL HIGH (ref 70–99)
Potassium: 4.4 mmol/L (ref 3.5–5.2)
Sodium: 141 mmol/L (ref 134–144)
Total Protein: 6.4 g/dL (ref 6.0–8.5)
eGFR: 89 mL/min/{1.73_m2} (ref >59)

## 2022-05-10 LAB — LIPID PANEL
Chol/HDL Ratio: 3 ratio (ref 0.0–5.0)
Cholesterol, Total: 137 mg/dL (ref 100–199)
HDL: 45 mg/dL (ref >39)
LDL Chol Calc (NIH): 71 mg/dL (ref 0–99)
Triglycerides: 117 mg/dL (ref 0–149)
VLDL Cholesterol Cal: 21 mg/dL (ref 5–40)

## 2022-05-10 LAB — HEMOGLOBIN A1C
Est. average glucose Bld gHb Est-mCnc: 117 mg/dL
Hgb A1c MFr Bld: 5.7 % — ABNORMAL HIGH (ref 4.8–5.6)

## 2022-05-10 NOTE — Telephone Encounter (Signed)
**Note De-Identified Kimon Loewen Obfuscation** The pts [employer] application for Sherryll Burger was left at the office with documents.  I have completed the providers page of his application and have e-mailed all to Dr Michaelle Copas nurse so she can print a Entresto 24-26 mg RX for #180 with 3 refills, have Dr Katrinka Blazing sign/date it and the application, and to then fax all to [employer] at the fax number written on the cover letter included.

## 2022-05-13 ENCOUNTER — Other Ambulatory Visit: Payer: Self-pay | Admitting: Interventional Cardiology

## 2022-05-13 MED ORDER — ENTRESTO 24-26 MG PO TABS: 1.0000 | ORAL_TABLET | Freq: Two times a day (BID) | ORAL | 3 refills | Status: DC

## 2022-05-13 NOTE — Progress Notes (Signed)
Print Rx for Entresto 24-26mg  BID for #180/3 refills to send with patient assistance application.

## 2022-05-13 NOTE — Telephone Encounter (Signed)
Patient assistance forms signed and faxed. 

## 2022-05-15 ENCOUNTER — Telehealth: Payer: Self-pay | Admitting: Interventional Cardiology

## 2022-05-15 DIAGNOSIS — I10 Essential (primary) hypertension: Secondary | ICD-10-CM

## 2022-05-15 DIAGNOSIS — Z79899 Other long term (current) drug therapy: Secondary | ICD-10-CM

## 2022-05-15 MED ORDER — SPIRONOLACTONE 25 MG PO TABS: 12.5000 mg | ORAL_TABLET | ORAL | 3 refills | Status: DC

## 2022-05-15 NOTE — Telephone Encounter (Signed)
-----   Message from Lyn Records, MD sent at 05/10/2022  2:59 PM EST ----- Let the patient know the heart function shows decrease function but unchanged from prior. Continue current heart failure therapy. Start spironolactone 12.5 mg M, W, F. BMET 3 weeks A copy will be sent to Patrcia Dolly, DO

## 2022-05-15 NOTE — Telephone Encounter (Signed)
Discussed lab results with patient.  Per Dr. Katrinka Blazing: Let the patient know the heart function shows decrease function but unchanged from prior. Continue current heart failure therapy. Start spironolactone 12.5 mg M, W, F. BMET 3 weeks   Spironolactone sent to pharmacy of choice. BMET ordered and lab appt scheduled for 06/10/22.  Patient verbalized understanding of the above and expressed appreciation for call.

## 2022-05-20 ENCOUNTER — Telehealth: Payer: Self-pay | Admitting: Interventional Cardiology

## 2022-05-20 ENCOUNTER — Telehealth: Payer: Self-pay

## 2022-05-20 NOTE — Telephone Encounter (Signed)
**Note De-Identified Nabil Bubolz Obfuscation** Letter received from [employer] pt [employer] stating that they have approvd the pt for [place] assistance until 07/01/2023. Pt ID: [id number]  The letter states that they have notified the pt of this approval as well.

## 2022-05-20 NOTE — Telephone Encounter (Signed)
Pt c/o medication issue:  1. Name of Medication:  spironolactone (ALDACTONE) 25 MG tablet  2. How are you currently taking this medication (dosage and times per day)?  As prescribed  3. Are you having a reaction (difficulty breathing--STAT)?   4. What is your medication issue?   Patient states this medication is causing diarrhea and a headache

## 2022-05-20 NOTE — Telephone Encounter (Signed)
**Note De-Identified Deanne Bedgood Obfuscation** We received an Entresto refill form from [employer].  I have completed the form, attached a list of the pts allergies, medications, and his health conditions  and have e-mailed all to Dr Michaelle Copas nurse so she can obtain his signature, date it, and so she can  then fax all to [employer] at he fax number circled on the first page.

## 2022-05-20 NOTE — Telephone Encounter (Signed)
Returned call to patient.  Patient reports on days he takes spironolactone he has diarrhea and a headache. He does not experience this on days he does not take this medication.  Patient states he has tried taking the spironolactone with food and this has not made a difference in symptoms for him.  Will forward to Dr. Katrinka Blazing to review and advise.

## 2022-05-21 NOTE — Telephone Encounter (Signed)
Patient assistance application for [facility] signed and faxed to [employer].

## 2022-05-22 NOTE — Telephone Encounter (Signed)
Left message for patient informing him of Dr. Michaelle Copas recommendation:  Stop using spironolactone.  List the medication as an intolerance due to abdominal bloating and diarrhea     Medication list updated.  Provided office number for callback if any questions or concerns.

## 2022-06-10 ENCOUNTER — Other Ambulatory Visit: Payer: BC Managed Care – PPO

## 2022-07-09 ENCOUNTER — Other Ambulatory Visit: Payer: Self-pay

## 2022-07-09 MED ORDER — DAPAGLIFLOZIN PROPANEDIOL 10 MG PO TABS: 10.0000 mg | ORAL_TABLET | Freq: Every day | ORAL | 2 refills | Status: DC

## 2022-07-09 MED ORDER — CARVEDILOL 6.25 MG PO TABS: 6.2500 mg | ORAL_TABLET | Freq: Two times a day (BID) | ORAL | 2 refills | Status: DC

## 2022-07-09 MED ORDER — ATORVASTATIN CALCIUM 40 MG PO TABS: 40.0000 mg | ORAL_TABLET | Freq: Every day | ORAL | 2 refills | Status: DC

## 2022-07-09 NOTE — Telephone Encounter (Signed)
Pt's medication was sent to pt's pharmacy as requested. Confirmation received.  °

## 2022-08-26 ENCOUNTER — Ambulatory Visit: Payer: Medicare Other | Admitting: Nurse Practitioner

## 2022-09-20 ENCOUNTER — Other Ambulatory Visit: Payer: Self-pay | Admitting: *Deleted

## 2022-09-20 NOTE — Telephone Encounter (Signed)
Received refill request for carvedilol 6.25 [facility]

## 2022-09-23 MED ORDER — CARVEDILOL 6.25 MG PO TABS: 6.2500 mg | ORAL_TABLET | Freq: Two times a day (BID) | ORAL | 2 refills | Status: DC

## 2022-09-25 ENCOUNTER — Other Ambulatory Visit: Payer: Self-pay | Admitting: *Deleted

## 2022-11-25 ENCOUNTER — Ambulatory Visit: Payer: Medicare Other | Admitting: Cardiovascular Disease

## 2022-11-25 ENCOUNTER — Encounter: Payer: Self-pay | Admitting: Cardiovascular Disease

## 2022-11-25 VITALS — BP 124/74 | HR 59 | Ht 72.0 in | Wt 206.8 lb

## 2022-11-25 DIAGNOSIS — E785 Hyperlipidemia, unspecified: Secondary | ICD-10-CM

## 2022-11-25 DIAGNOSIS — I480 Paroxysmal atrial fibrillation: Secondary | ICD-10-CM

## 2022-11-25 DIAGNOSIS — I5022 Chronic systolic (congestive) heart failure: Secondary | ICD-10-CM | POA: Diagnosis not present

## 2022-11-25 DIAGNOSIS — I739 Peripheral vascular disease, unspecified: Secondary | ICD-10-CM

## 2022-11-25 DIAGNOSIS — I5020 Unspecified systolic (congestive) heart failure: Secondary | ICD-10-CM

## 2022-11-25 DIAGNOSIS — I10 Essential (primary) hypertension: Secondary | ICD-10-CM

## 2022-11-25 DIAGNOSIS — Z720 Tobacco use: Secondary | ICD-10-CM

## 2022-11-25 DIAGNOSIS — I2510 Atherosclerotic heart disease of native coronary artery without angina pectoris: Secondary | ICD-10-CM

## 2022-11-25 DIAGNOSIS — I214 Non-ST elevation (NSTEMI) myocardial infarction: Secondary | ICD-10-CM

## 2022-11-25 NOTE — Progress Notes (Signed)
Cardiology Office Note   Date:  11/25/2022   ID:  [name] Walker, DOB [birth year], MRN [id number]  PCP:  Barbette Reichmann, MD  Cardiologist:  Adrian Blackwater, MD      History of Present Illness: [name] Walker is a 67 y.o. male who presents for  Chief Complaint  Patient presents with   Follow-up    4 month follow up    Denies chest pain or SOB. Has extensive h/o CAD s/p PCI/Stenting LAD after 2013 NSTEMI /CHF with 2/22 echo EF still 30%.  Dizziness This is a chronic problem. The current episode started more than 1 year ago. The problem has been unchanged.      Past Medical History:  Diagnosis Date   Bronchitis    CHRONIC   Chronic HFrEF (heart failure with reduced ejection fraction) (HCC)    a. 03/2021 Echo: EF 30-35%, GrI DD.   Coronary artery disease    a. 05/2012 Ant STEMI/PCI: LAD 100p (thrombectomy and 3.5x20 Veriflex BMS); b. 12/2014 Cath: patent stent; c. 03/2015 Cath: LM nl, LAD 10 ISR, 36m, D1 50p, LCX 20 diff throughout, RCA nl-->Med rx.   Erectile dysfunction    History of kidney stones    HOH (hard of hearing)    Hypercholesterolemia    Hypertension    Ischemic cardiomyopathy    a. 11/2015 Echo: EF 30-35%, diff HK; b. Refused ICD; c. 03/2021 Echo: EF 30-35%, GrI DD, nl RV fxn, mild MR.   MI (myocardial infarction) (HCC) 06/19/2012   being considered for defibilator but patient now refuses this.   Nephrolithiasis    NSTEMI (non-ST elevated myocardial infarction) (HCC) 03/2015   Patent BMS - LAD stent.   Sleep apnea    Tobacco abuse    Varicose veins      Past Surgical History:  Procedure Laterality Date   CARDIAC CATHETERIZATION N/A 01/09/2015   Procedure: Left Heart Cath and Coronary Angiography;  Surgeon: Lyn Records, MD;  Location: [facility] INVASIVE CV LAB;  Service: Cardiovascular;  Laterality: N/A;   CARDIAC CATHETERIZATION N/A 03/13/2015   Procedure: Left Heart Cath and Coronary Angiography;  Surgeon: Lyn Records, MD;  Location: [facility] INVASIVE CV LAB;  Service: Cardiovascular;  Laterality: N/A;   CORONARY ANGIOPLASTY     STENT   CORONARY STENT PLACEMENT     CYSTOSCOPY W/ URETERAL STENT REMOVAL Right 05/13/2017   Procedure: CYSTOSCOPY WITH STENT REMOVAL;  Surgeon: Orson Ape, MD;  Location: [facility] ORS;  Service: Urology;  Laterality: Right;   CYSTOSCOPY WITH STENT PLACEMENT Right 03/07/2015   Procedure: CYSTOSCOPY WITH STENT PLACEMENT;  Surgeon: Orson Ape, MD;  Location: [facility];  Service: Urology;  Laterality: Right;   CYSTOSCOPY WITH STENT PLACEMENT Right 03/25/2017   Procedure: CYSTOSCOPY WITH STENT PLACEMENT;  Surgeon: Orson Ape, MD;  Location: [facility];  Service: Urology;  Laterality: Right;   CYSTOSCOPY WITH STENT PLACEMENT Right 07/12/2021   Procedure: CYSTOSCOPY WITH STENT PLACEMENT;  Surgeon: Orson Ape, MD;  Location: [facility];  Service: Urology;  Laterality: Right;   CYSTOSCOPY/RETROGRADE/URETEROSCOPY Right 02/24/2018   Procedure: CYSTOSCOPY/RETROGRADE/URETEROSCOPY;  Surgeon: Orson Ape, MD;  Location: [facility];  Service: Urology;  Laterality: Right;   CYSTOSCOPY/RETROGRADE/URETEROSCOPY/STONE EXTRACTION WITH BASKET Right 03/25/2017   Procedure: CYSTOSCOPY/RETROGRADE/URETEROSCOPY/STONE EXTRACTION WITH BASKET;  Surgeon: Orson Ape, MD;  Location: [facility];  Service: Urology;  Laterality: Right;   EXTRACORPOREAL SHOCK WAVE LITHOTRIPSY Right 03/09/2015   Procedure: EXTRACORPOREAL SHOCK WAVE LITHOTRIPSY (ESWL);  Surgeon: Casimiro Needle  Gilles Chiquito, MD;  Location: [facility];  Service: Urology;  Laterality: Right;   EXTRACORPOREAL SHOCK WAVE LITHOTRIPSY Right 05/11/2015   Procedure: EXTRACORPOREAL SHOCK WAVE LITHOTRIPSY (ESWL);  Surgeon: Orson Ape, MD;  Location: [facility];  Service: Urology;  Laterality: Right;   EXTRACORPOREAL SHOCK WAVE LITHOTRIPSY Right 04/10/2017   Procedure: EXTRACORPOREAL SHOCK WAVE LITHOTRIPSY (ESWL);  Surgeon: Orson Ape, MD;  Location: [facility];   Service: Urology;  Laterality: Right;   EXTRACORPOREAL SHOCK WAVE LITHOTRIPSY Right 07/26/2021   Procedure: EXTRACORPOREAL SHOCK WAVE LITHOTRIPSY (ESWL);  Surgeon: Orson Ape, MD;  Location: [facility];  Service: Urology;  Laterality: Right;   LEFT HEART CATHETERIZATION WITH CORONARY ANGIOGRAM Right 06/19/2012   Procedure: LEFT HEART CATHETERIZATION WITH CORONARY ANGIOGRAM;  Surgeon: Lesleigh Noe, MD;  Location[facility] CATH LAB;  Service: Cardiovascular;  Laterality: Right;   PERCUTANEOUS CORONARY STENT INTERVENTION (PCI-S) Right 06/19/2012   Procedure: PERCUTANEOUS CORONARY STENT INTERVENTION (PCI-S);  Surgeon: Lesleigh Noe, MD;  Location: [facility] CATH LAB;  Service: Cardiovascular;  Laterality: Right;   URETEROSCOPY WITH HOLMIUM LASER LITHOTRIPSY Right 02/24/2018   Procedure: URETEROSCOPY;  Surgeon: Orson Ape, MD;  Location: [facility];  Service: Urology;  Laterality: Right;     Current Outpatient Medications  Medication Sig Dispense Refill   albuterol (VENTOLIN HFA) 108 (90 Base) MCG/ACT inhaler Inhale 2 puffs into the lungs every 6 (six) hours as needed for wheezing or shortness of breath.     aspirin EC 81 MG EC tablet Take 1 tablet (81 mg total) by mouth daily. Swallow whole. 30 tablet 11   atorvastatin (LIPITOR) 40 MG tablet Take 1 tablet (40 mg total) by mouth daily. 90 tablet 2   carvedilol (COREG) 6.25 MG tablet Take 1 tablet (6.25 mg total) by mouth 2 (two) times daily. 180 tablet 2   cefdinir (OMNICEF) 300 MG capsule Take 300 mg by mouth 2 (two) times daily.     dapagliflozin propanediol (FARXIGA) 10 MG TABS tablet Take 1 tablet (10 mg total) by mouth daily before breakfast. 90 tablet 2   oxyCODONE-acetaminophen (PERCOCET/ROXICET) 5-325 MG tablet Take 1 tablet by mouth every 4 (four) hours as needed.     sacubitril-valsartan (ENTRESTO) 24-26 MG Take 1 tablet by mouth 2 (two) times daily. 180 tablet 3   tamsulosin (FLOMAX) 0.4 MG CAPS capsule Take 0.4 mg by mouth.     No  current facility-administered medications for this visit.    Allergies:   Nucynta [tapentadol], Spironolactone, and Codeine    Social History:   reports that he has been smoking cigarettes. He has a 20.00 pack-year smoking history. He has never used smokeless tobacco. He reports current alcohol use. He reports that he does not use drugs.   Family History:  family history includes Cancer in his father; Heart attack in his brother; Heart murmur in his mother.    ROS:     Review of Systems  Constitutional: Negative.   HENT: Negative.    Eyes: Negative.   Respiratory: Negative.    Gastrointestinal: Negative.   Genitourinary: Negative.   Musculoskeletal: Negative.   Skin: Negative.   Neurological:  Positive for dizziness.  Endo/Heme/Allergies: Negative.   Psychiatric/Behavioral: Negative.    All other systems reviewed and are negative.     All other systems are reviewed and negative.    PHYSICAL EXAM: VS:  BP 124/74   Pulse (!) 59   Ht 6' (1.829 m)   Wt 206 lb 12.8 oz (93.8  kg)   SpO2 99%   BMI 28.05 kg/m  , BMI Body mass index is 28.05 kg/m. Last weight:  Wt Readings from Last 3 Encounters:  11/25/22 206 lb 12.8 oz (93.8 kg)  04/04/22 205 lb (93 kg)  10/02/21 217 lb 9.6 oz (98.7 kg)     Physical Exam Vitals reviewed.  Constitutional:      Appearance: Normal appearance. He is normal weight.  HENT:     Head: Normocephalic.     Nose: Nose normal.     Mouth/Throat:     Mouth: Mucous membranes are moist.  Eyes:     Pupils: Pupils are equal, round, and reactive to light.  Cardiovascular:     Rate and Rhythm: Normal rate and regular rhythm.     Pulses: Normal pulses.     Heart sounds: Normal heart sounds.  Pulmonary:     Effort: Pulmonary effort is normal.  Abdominal:     General: Abdomen is flat. Bowel sounds are normal.  Musculoskeletal:        General: Normal range of motion.     Cervical back: Normal range of motion.  Skin:    General: Skin is warm.   Neurological:     General: No focal deficit present.     Mental Status: He is alert.  Psychiatric:        Mood and Affect: Mood normal.       EKG:   Recent Labs: 05/09/2022: ALT 10; BUN 17; Creatinine, Ser 0.95; Potassium 4.4; Sodium 141    Lipid Panel    Component Value Date/Time   CHOL 137 05/09/2022 0958   TRIG 117 05/09/2022 0958   HDL 45 05/09/2022 0958   CHOLHDL 3.0 05/09/2022 0958   CHOLHDL 3.5 03/12/2015 0420   VLDL 24 03/12/2015 0420   LDLCALC 71 05/09/2022 0958      Other studies Reviewed: Additional studies/ records that were reviewed today include:  Review of the above records demonstrates:       No data to display            ASSESSMENT AND PLAN:    ICD-10-CM   1. Paroxysmal atrial fibrillation (HCC)  I48.0 MYOCARDIAL PERFUSION IMAGING    PCV ECHOCARDIOGRAM COMPLETE    2. Chronic systolic heart failure (HCC)  Z61.09 MYOCARDIAL PERFUSION IMAGING    PCV ECHOCARDIOGRAM COMPLETE    3. Atherosclerosis of native coronary artery of native heart without angina pectoris  I25.10 MYOCARDIAL PERFUSION IMAGING    PCV ECHOCARDIOGRAM COMPLETE    4. Essential hypertension, benign  I10 MYOCARDIAL PERFUSION IMAGING    PCV ECHOCARDIOGRAM COMPLETE    5. HFrEF (heart failure with reduced ejection fraction) (HCC)  I50.20 MYOCARDIAL PERFUSION IMAGING    PCV ECHOCARDIOGRAM COMPLETE   EF, needs to go to 50%, will recheck by ECHO    6. NSTEMI (non-ST elevated myocardial infarction) (HCC)  I21.4 MYOCARDIAL PERFUSION IMAGING    PCV ECHOCARDIOGRAM COMPLETE    7. Tobacco abuse  Z72.0 MYOCARDIAL PERFUSION IMAGING    PCV ECHOCARDIOGRAM COMPLETE    8. Dyslipidemia  E78.5 MYOCARDIAL PERFUSION IMAGING    PCV ECHOCARDIOGRAM COMPLETE    9. Claudication (HCC)  I73.9 MYOCARDIAL PERFUSION IMAGING    PCV ECHOCARDIOGRAM COMPLETE       Problem List Items Addressed This Visit       Cardiovascular and Mediastinum   Chronic systolic heart failure (HCC)   Relevant Orders    MYOCARDIAL PERFUSION IMAGING   PCV ECHOCARDIOGRAM COMPLETE   Essential  hypertension, benign   Relevant Orders   MYOCARDIAL PERFUSION IMAGING   PCV ECHOCARDIOGRAM COMPLETE   Coronary atherosclerosis of native coronary artery   Relevant Orders   MYOCARDIAL PERFUSION IMAGING   PCV ECHOCARDIOGRAM COMPLETE   NSTEMI (non-ST elevated myocardial infarction) (HCC)   Relevant Orders   MYOCARDIAL PERFUSION IMAGING   PCV ECHOCARDIOGRAM COMPLETE   Atrial fibrillation (HCC) - Primary   Relevant Orders   MYOCARDIAL PERFUSION IMAGING   PCV ECHOCARDIOGRAM COMPLETE   HFrEF (heart failure with reduced ejection fraction) (HCC)   Relevant Orders   MYOCARDIAL PERFUSION IMAGING   PCV ECHOCARDIOGRAM COMPLETE     Other   Tobacco abuse   Relevant Orders   MYOCARDIAL PERFUSION IMAGING   PCV ECHOCARDIOGRAM COMPLETE   Dyslipidemia   Relevant Orders   MYOCARDIAL PERFUSION IMAGING   PCV ECHOCARDIOGRAM COMPLETE   Claudication (HCC)   Relevant Medications   oxyCODONE-acetaminophen (PERCOCET/ROXICET) 5-325 MG tablet   Other Relevant Orders   MYOCARDIAL PERFUSION IMAGING   PCV ECHOCARDIOGRAM COMPLETE       Disposition:   Return in about 2 weeks (around 12/09/2022) for echo, stress test and f/u.    Total time spent: 30 minutes  Signed,  Adrian Blackwater, MD  11/25/2022 3:06 PM    [facility]

## 2022-12-09 ENCOUNTER — Other Ambulatory Visit: Payer: Medicare Other

## 2022-12-12 ENCOUNTER — Ambulatory Visit: Payer: Medicare Other | Admitting: Cardiovascular Disease

## 2022-12-20 ENCOUNTER — Ambulatory Visit: Payer: Self-pay | Admitting: Urology

## 2023-01-09 ENCOUNTER — Encounter: Payer: Self-pay | Admitting: Urology

## 2023-01-09 ENCOUNTER — Ambulatory Visit: Payer: Medicare Other | Admitting: Urology

## 2023-01-09 VITALS — BP 96/62 | HR 64 | Ht 72.0 in | Wt 206.0 lb

## 2023-01-09 DIAGNOSIS — N401 Enlarged prostate with lower urinary tract symptoms: Secondary | ICD-10-CM | POA: Diagnosis not present

## 2023-01-09 DIAGNOSIS — Z87442 Personal history of urinary calculi: Secondary | ICD-10-CM

## 2023-01-09 DIAGNOSIS — R3129 Other microscopic hematuria: Secondary | ICD-10-CM

## 2023-01-09 DIAGNOSIS — N200 Calculus of kidney: Secondary | ICD-10-CM

## 2023-01-09 LAB — URINALYSIS, COMPLETE
Bilirubin, UA: NEGATIVE
Ketones, UA: NEGATIVE
Leukocytes,UA: NEGATIVE
Nitrite, UA: NEGATIVE
Protein,UA: NEGATIVE
Specific Gravity, UA: 1.02 (ref 1.005–1.030)
Urobilinogen, Ur: 0.2 mg/dL (ref 0.2–1.0)
pH, UA: 5.5 (ref 5.0–7.5)

## 2023-01-09 LAB — MICROSCOPIC EXAMINATION: RBC, Urine: >30 /hpf — AB (ref 0–2)

## 2023-01-09 MED ORDER — TAMSULOSIN HCL 0.4 MG PO CAPS: 0.4000 mg | ORAL_CAPSULE | Freq: Two times a day (BID) | ORAL | 3 refills | Status: DC

## 2023-01-09 NOTE — Progress Notes (Signed)
I, [name] Walker,acting as a scribe for [name] Altes, MD.,have documented all relevant documentation on the behalf of [name] Altes, MD,as directed by  [name] Altes, MD while in the presence of [name] Altes, MD.  07/11/204 3:19 PM   [name] Walker 1956-02-18 [id number]  Referring provider: Barbette Reichmann, MD [address] [facility] [place],  [place] [address]  Chief Complaint  Patient presents with   New Patient (Initial Visit)    HPI: [name] Walker is a 67 y.o. male presents for evaluation of nephrolithiasis.   Previously followed by Dr. Evelene Walker and has had multiple stent placement, ureteroscopic stone removal and shockwave lithotripsy over the years. His last procedure was a stent placement in January 2023 for an obstructing 10 mm right ureteral calculus. He was subsequently treated with shockwave lithotripsy for his 10mm ureteral calculus.  He last saw Dr. Evelene Walker December 2023 for a routine check for BPH and has been on tamsulosin. He was seen at [facility] acute care 11/22/22 complaining of right flank and right lower quadrant pain along with dysuria, nausea, and vomiting Urinalysis showed 3+ blood and 24 RBCs. He was started on empiric antibiotics and given pain medication and tamsulosin was increased to 0.8 mg; he subsequently passed a small stone and his symptoms resolved. He has no complaints today.  He does feel he voids much better with titration of tamsulosin and has requested continuing this dose.   PMH: Past Medical History:  Diagnosis Date   Bronchitis    CHRONIC   Chronic HFrEF (heart failure with reduced ejection fraction) (HCC)    a. 03/2021 Echo: EF 30-35%, GrI DD.   Coronary artery disease    a. 05/2012 Ant STEMI/PCI: LAD 100p (thrombectomy and 3.5x20 Veriflex BMS); b. 12/2014 Cath: patent stent; c. 03/2015 Cath: LM nl, LAD 10 ISR, 70m, D1 50p, LCX 20 diff throughout, RCA nl-->Med rx.   Erectile dysfunction    History of  kidney stones    HOH (hard of hearing)    Hypercholesterolemia    Hypertension    Ischemic cardiomyopathy    a. 11/2015 Echo: EF 30-35%, diff HK; b. Refused ICD; c. 03/2021 Echo: EF 30-35%, GrI DD, nl RV fxn, mild MR.   MI (myocardial infarction) (HCC) 06/19/2012   being considered for defibilator but patient now refuses this.   Nephrolithiasis    NSTEMI (non-ST elevated myocardial infarction) (HCC) 03/2015   Patent BMS - LAD stent.   Sleep apnea    Tobacco abuse    Varicose veins     Surgical History: Past Surgical History:  Procedure Laterality Date   CARDIAC CATHETERIZATION N/A 01/09/2015   Procedure: Left Heart Cath and Coronary Angiography;  Surgeon: [name] Records, MD;  Location: [facility] INVASIVE CV LAB;  Service: Cardiovascular;  Laterality: N/A;   CARDIAC CATHETERIZATION N/A 03/13/2015   Procedure: Left Heart Cath and Coronary Angiography;  Surgeon: [name] Records, MD;  Location: [facility] INVASIVE CV LAB;  Service: Cardiovascular;  Laterality: N/A;   CORONARY ANGIOPLASTY     STENT   CORONARY STENT PLACEMENT     CYSTOSCOPY W/ URETERAL STENT REMOVAL Right 05/13/2017   Procedure: CYSTOSCOPY WITH STENT REMOVAL;  Surgeon: [name] Ape, MD;  Location: [facility] ORS;  Service: Urology;  Laterality: Right;   CYSTOSCOPY WITH STENT PLACEMENT Right 03/07/2015   Procedure: CYSTOSCOPY WITH STENT PLACEMENT;  Surgeon: [name] Ape, MD;  Location: [facility];  Service: Urology;  Laterality: Right;  CYSTOSCOPY WITH STENT PLACEMENT Right 03/25/2017   Procedure: CYSTOSCOPY WITH STENT PLACEMENT;  Surgeon: [name] Ape, MD;  Location: [facility];  Service: Urology;  Laterality: Right;   CYSTOSCOPY WITH STENT PLACEMENT Right 07/12/2021   Procedure: CYSTOSCOPY WITH STENT PLACEMENT;  Surgeon: [name] Ape, MD;  Location: [facility];  Service: Urology;  Laterality: Right;   CYSTOSCOPY/RETROGRADE/URETEROSCOPY Right 02/24/2018   Procedure: CYSTOSCOPY/RETROGRADE/URETEROSCOPY;  Surgeon: [name] Ape, MD;   Location: [facility];  Service: Urology;  Laterality: Right;   CYSTOSCOPY/RETROGRADE/URETEROSCOPY/STONE EXTRACTION WITH BASKET Right 03/25/2017   Procedure: CYSTOSCOPY/RETROGRADE/URETEROSCOPY/STONE EXTRACTION WITH BASKET;  Surgeon: [name] Ape, MD;  Location: [facility];  Service: Urology;  Laterality: Right;   EXTRACORPOREAL SHOCK WAVE LITHOTRIPSY Right 03/09/2015   Procedure: EXTRACORPOREAL SHOCK WAVE LITHOTRIPSY (ESWL);  Surgeon: [name] Ape, MD;  Location: [facility];  Service: Urology;  Laterality: Right;   EXTRACORPOREAL SHOCK WAVE LITHOTRIPSY Right 05/11/2015   Procedure: EXTRACORPOREAL SHOCK WAVE LITHOTRIPSY (ESWL);  Surgeon: [name] Ape, MD;  Location: [facility];  Service: Urology;  Laterality: Right;   EXTRACORPOREAL SHOCK WAVE LITHOTRIPSY Right 04/10/2017   Procedure: EXTRACORPOREAL SHOCK WAVE LITHOTRIPSY (ESWL);  Surgeon: [name] Ape, MD;  Location: [facility];  Service: Urology;  Laterality: Right;   EXTRACORPOREAL SHOCK WAVE LITHOTRIPSY Right 07/26/2021   Procedure: EXTRACORPOREAL SHOCK WAVE LITHOTRIPSY (ESWL);  Surgeon: [name] Ape, MD;  Location: [facility];  Service: Urology;  Laterality: Right;   LEFT HEART CATHETERIZATION WITH CORONARY ANGIOGRAM Right 06/19/2012   Procedure: LEFT HEART CATHETERIZATION WITH CORONARY ANGIOGRAM;  Surgeon: [name] Noe, MD;  Location: [facility] CATH LAB;  Service: Cardiovascular;  Laterality: Right;   PERCUTANEOUS CORONARY STENT INTERVENTION (PCI-S) Right 06/19/2012   Procedure: PERCUTANEOUS CORONARY STENT INTERVENTION (PCI-S);  Surgeon: [name] Noe, MD;  Location: [facility] CATH LAB;  Service: Cardiovascular;  Laterality: Right;   URETEROSCOPY WITH HOLMIUM LASER LITHOTRIPSY Right 02/24/2018   Procedure: URETEROSCOPY;  Surgeon: [name] Ape, MD;  Location: [facility];  Service: Urology;  Laterality: Right;    Home Medications:  Allergies as of 01/09/2023       Reactions   Nucynta [tapentadol] Nausea Only, Other (See Comments)   Dizziness,  woozy.  Sick to his stomach   Spironolactone    Diarrhea/headache   Codeine Other (See Comments)   Too drowsy        Medication List        Accurate as of January 09, 2023  3:19 PM. If you have any questions, ask your nurse or doctor.          albuterol 108 (90 Base) MCG/ACT inhaler Commonly known as: VENTOLIN HFA Inhale 2 puffs into the lungs every 6 (six) hours as needed for wheezing or shortness of breath.   aspirin EC 81 MG tablet Take 1 tablet (81 mg total) by mouth daily. Swallow whole.   atorvastatin 40 MG tablet Commonly known as: LIPITOR Take 1 tablet (40 mg total) by mouth daily.   carvedilol 6.25 MG tablet Commonly known as: COREG Take 1 tablet (6.25 mg total) by mouth 2 (two) times daily.   dapagliflozin propanediol 10 MG Tabs tablet Commonly known as: Farxiga Take 1 tablet (10 mg total) by mouth daily before breakfast. What changed: how much to take   Entresto 24-26 MG Generic drug: sacubitril-valsartan Take 1 tablet by mouth 2 (two) times daily.   tamsulosin 0.4 MG Caps capsule Commonly known as: Flomax Take 1 capsule (0.4 mg total) by mouth 2 (two) times daily.  Allergies:  Allergies  Allergen Reactions   Nucynta [Tapentadol] Nausea Only and Other (See Comments)    Dizziness, woozy.  Sick to his stomach   Spironolactone     Diarrhea/headache   Codeine Other (See Comments)    Too drowsy    Family History: Family History  Problem Relation Age of Onset   Cancer Father    Heart murmur Mother    Heart attack Brother     Social History:  reports that he has been smoking cigarettes. He has a 20 pack-year smoking history. He has never used smokeless tobacco. He reports current alcohol use. He reports that he does not use drugs.   Physical Exam: BP 96/62   Pulse 64   Ht 6' (1.829 m)   Wt 206 lb (93.4 kg)   BMI 27.94 kg/m   Constitutional:  Alert and oriented, No acute distress. HEENT: [place] AT, moist mucus membranes.  Trachea  midline, no masses. Cardiovascular: No clubbing, cyanosis, or edema. Respiratory: Normal respiratory effort, no increased work of breathing. GI: Abdomen is soft, nontender, nondistended, no abdominal masses Skin: No rashes, bruises or suspicious lesions. Neurologic: Grossly intact, no focal deficits, moving all 4 extremities. Psychiatric: Normal mood and affect.   Urinalysis 2+ glucose/3+ blood, microscopy >30 RBCs   Assessment & Plan:    1. Recurrent nephrolithiasis Recently passed stone, presently asymptomatic Significant microhematuria and KUB ordered; he will be notified with the results and further recommendations  2. BPH with LUTS  Improvement in his voiding symptoms on tamsulosin 0.8 mg- Rx sent to pharmacy.   I have reviewed the above documentation for accuracy and completeness, and I agree with the above.   [name] Altes, MD  [facility] [address] [place], [place] [address] [phone]

## 2023-01-10 ENCOUNTER — Other Ambulatory Visit: Payer: Medicare Other

## 2023-01-14 ENCOUNTER — Ambulatory Visit: Payer: Medicare Other | Admitting: Cardiovascular Disease

## 2023-01-27 ENCOUNTER — Ambulatory Visit (INDEPENDENT_AMBULATORY_CARE_PROVIDER_SITE_OTHER): Payer: Medicare Other

## 2023-01-27 DIAGNOSIS — Z720 Tobacco use: Secondary | ICD-10-CM

## 2023-01-27 DIAGNOSIS — I5022 Chronic systolic (congestive) heart failure: Secondary | ICD-10-CM

## 2023-01-27 DIAGNOSIS — E785 Hyperlipidemia, unspecified: Secondary | ICD-10-CM

## 2023-01-27 DIAGNOSIS — I739 Peripheral vascular disease, unspecified: Secondary | ICD-10-CM

## 2023-01-27 DIAGNOSIS — I214 Non-ST elevation (NSTEMI) myocardial infarction: Secondary | ICD-10-CM

## 2023-01-27 DIAGNOSIS — I5020 Unspecified systolic (congestive) heart failure: Secondary | ICD-10-CM

## 2023-01-27 DIAGNOSIS — I2510 Atherosclerotic heart disease of native coronary artery without angina pectoris: Secondary | ICD-10-CM | POA: Diagnosis not present

## 2023-01-27 DIAGNOSIS — I10 Essential (primary) hypertension: Secondary | ICD-10-CM

## 2023-01-27 DIAGNOSIS — I480 Paroxysmal atrial fibrillation: Secondary | ICD-10-CM

## 2023-01-27 MED ORDER — TECHNETIUM TC 99M SESTAMIBI GENERIC - CARDIOLITE
32.0000 | Freq: Once | INTRAVENOUS | Status: AC | PRN
  Administered 2023-01-27: 32 via INTRAVENOUS

## 2023-01-27 MED ORDER — TECHNETIUM TC 99M SESTAMIBI GENERIC - CARDIOLITE
9.5000 | Freq: Once | INTRAVENOUS | Status: AC | PRN
  Administered 2023-01-27: 9.5 via INTRAVENOUS

## 2023-01-29 ENCOUNTER — Other Ambulatory Visit: Payer: Self-pay | Admitting: Nurse Practitioner

## 2023-01-29 ENCOUNTER — Ambulatory Visit (INDEPENDENT_AMBULATORY_CARE_PROVIDER_SITE_OTHER): Payer: Medicare Other

## 2023-01-29 DIAGNOSIS — I5020 Unspecified systolic (congestive) heart failure: Secondary | ICD-10-CM

## 2023-01-29 DIAGNOSIS — I214 Non-ST elevation (NSTEMI) myocardial infarction: Secondary | ICD-10-CM

## 2023-01-29 DIAGNOSIS — E785 Hyperlipidemia, unspecified: Secondary | ICD-10-CM

## 2023-01-29 DIAGNOSIS — I480 Paroxysmal atrial fibrillation: Secondary | ICD-10-CM

## 2023-01-29 DIAGNOSIS — I371 Nonrheumatic pulmonary valve insufficiency: Secondary | ICD-10-CM

## 2023-01-29 DIAGNOSIS — I2510 Atherosclerotic heart disease of native coronary artery without angina pectoris: Secondary | ICD-10-CM

## 2023-01-29 DIAGNOSIS — Z720 Tobacco use: Secondary | ICD-10-CM

## 2023-01-29 DIAGNOSIS — I340 Nonrheumatic mitral (valve) insufficiency: Secondary | ICD-10-CM | POA: Diagnosis not present

## 2023-01-29 DIAGNOSIS — I739 Peripheral vascular disease, unspecified: Secondary | ICD-10-CM

## 2023-01-29 DIAGNOSIS — I5022 Chronic systolic (congestive) heart failure: Secondary | ICD-10-CM

## 2023-01-29 DIAGNOSIS — I10 Essential (primary) hypertension: Secondary | ICD-10-CM

## 2023-01-30 ENCOUNTER — Other Ambulatory Visit: Payer: Medicare Other

## 2023-02-03 ENCOUNTER — Ambulatory Visit: Payer: Medicare Other | Admitting: Cardiovascular Disease

## 2023-02-03 ENCOUNTER — Encounter: Payer: Self-pay | Admitting: Cardiovascular Disease

## 2023-02-03 VITALS — BP 110/73 | HR 61 | Ht 72.0 in | Wt 206.2 lb

## 2023-02-03 DIAGNOSIS — I259 Chronic ischemic heart disease, unspecified: Secondary | ICD-10-CM

## 2023-02-03 DIAGNOSIS — R0789 Other chest pain: Secondary | ICD-10-CM

## 2023-02-03 DIAGNOSIS — I2510 Atherosclerotic heart disease of native coronary artery without angina pectoris: Secondary | ICD-10-CM

## 2023-02-03 DIAGNOSIS — I252 Old myocardial infarction: Secondary | ICD-10-CM

## 2023-02-03 DIAGNOSIS — I5020 Unspecified systolic (congestive) heart failure: Secondary | ICD-10-CM

## 2023-02-03 DIAGNOSIS — I480 Paroxysmal atrial fibrillation: Secondary | ICD-10-CM

## 2023-02-03 DIAGNOSIS — F17209 Nicotine dependence, unspecified, with unspecified nicotine-induced disorders: Secondary | ICD-10-CM

## 2023-02-03 NOTE — Patient Instructions (Signed)
TAKE COREG(CARVEDOLOL) 1 TAB NIGHT BEFORE AND 1 TAB 90 MINUTES PRIOR TO CTA CORONARIES

## 2023-02-03 NOTE — Progress Notes (Signed)
Cardiology Office Note   Date:  02/03/2023   ID:  [name] Walker, [name] Walker September 11, 1955, MRN [id number]  PCP:  Orson [name], NP  Cardiologist:  Adrian Blackwater, MD      History of Present Illness: [name] Walker is a 67 y.o. male who presents for  Chief Complaint  Patient presents with   Follow-up    2 week ECHO/NST results    Doing well      Past Medical History:  Diagnosis Date   Bronchitis    CHRONIC   Chronic HFrEF (heart failure with reduced ejection fraction) (HCC)    a. 03/2021 Echo: EF 30-35%, GrI DD.   Coronary artery disease    a. 05/2012 Ant STEMI/PCI: LAD 100p (thrombectomy and 3.5x20 Veriflex BMS); b. 12/2014 Cath: patent stent; c. 03/2015 Cath: LM nl, LAD 10 ISR, 73m, D1 50p, LCX 20 diff throughout, RCA nl-->Med rx.   Erectile dysfunction    History of kidney stones    HOH (hard of hearing)    Hypercholesterolemia    Hypertension    Ischemic cardiomyopathy    a. 11/2015 Echo: EF 30-35%, diff HK; b. Refused ICD; c. 03/2021 Echo: EF 30-35%, GrI DD, nl RV fxn, mild MR.   MI (myocardial infarction) (HCC) 06/19/2012   being considered for defibilator but patient now refuses this.   Nephrolithiasis    NSTEMI (non-ST elevated myocardial infarction) (HCC) 03/2015   Patent BMS - LAD stent.   Sleep apnea    Tobacco abuse    Varicose veins      Past Surgical History:  Procedure Laterality Date   CARDIAC CATHETERIZATION N/A 01/09/2015   Procedure: Left Heart Cath and Coronary Angiography;  Surgeon: Lyn Records, MD;  Location: [facility] INVASIVE CV LAB;  Service: Cardiovascular;  Laterality: N/A;   CARDIAC CATHETERIZATION N/A 03/13/2015   Procedure: Left Heart Cath and Coronary Angiography;  Surgeon: Lyn Records, MD;  Location: [facility] INVASIVE CV LAB;  Service: Cardiovascular;  Laterality: N/A;   CORONARY ANGIOPLASTY     STENT   CORONARY STENT PLACEMENT     CYSTOSCOPY W/ URETERAL STENT REMOVAL Right 05/13/2017   Procedure: CYSTOSCOPY WITH STENT REMOVAL;  Surgeon: Orson Ape, MD;  Location: [facility];  Service: Urology;  Laterality: Right;   CYSTOSCOPY WITH STENT PLACEMENT Right 03/07/2015   Procedure: CYSTOSCOPY WITH STENT PLACEMENT;  Surgeon: Orson Ape, MD;  Location: [facility];  Service: Urology;  Laterality: Right;   CYSTOSCOPY WITH STENT PLACEMENT Right 03/25/2017   Procedure: CYSTOSCOPY WITH STENT PLACEMENT;  Surgeon: Orson Ape, MD;  Location: [facility];  Service: Urology;  Laterality: Right;   CYSTOSCOPY WITH STENT PLACEMENT Right 07/12/2021   Procedure: CYSTOSCOPY WITH STENT PLACEMENT;  Surgeon: Orson Ape, MD;  Location: [facility];  Service: Urology;  Laterality: Right;   CYSTOSCOPY/RETROGRADE/URETEROSCOPY Right 02/24/2018   Procedure: CYSTOSCOPY/RETROGRADE/URETEROSCOPY;  Surgeon: Orson Ape, MD;  Location: [facility];  Service: Urology;  Laterality: Right;   CYSTOSCOPY/RETROGRADE/URETEROSCOPY/STONE EXTRACTION WITH BASKET Right 03/25/2017   Procedure: CYSTOSCOPY/RETROGRADE/URETEROSCOPY/STONE EXTRACTION WITH BASKET;  Surgeon: Orson Ape, MD;  Location: [facility];  Service: Urology;  Laterality: Right;   EXTRACORPOREAL SHOCK WAVE LITHOTRIPSY Right 03/09/2015   Procedure: EXTRACORPOREAL SHOCK WAVE LITHOTRIPSY (ESWL);  Surgeon: Orson Ape, MD;  Location: [facility];  Service: Urology;  Laterality: Right;   EXTRACORPOREAL SHOCK WAVE LITHOTRIPSY Right 05/11/2015   Procedure: EXTRACORPOREAL SHOCK WAVE LITHOTRIPSY (ESWL);  Surgeon: Orson Ape, MD;  Location: [facility];  Service:  Urology;  Laterality: Right;   EXTRACORPOREAL SHOCK WAVE LITHOTRIPSY Right 04/10/2017   Procedure: EXTRACORPOREAL SHOCK WAVE LITHOTRIPSY (ESWL);  Surgeon: Orson Ape, MD;  Location: [facility];  Service: Urology;  Laterality: Right;   EXTRACORPOREAL SHOCK WAVE LITHOTRIPSY Right 07/26/2021   Procedure: EXTRACORPOREAL SHOCK WAVE LITHOTRIPSY (ESWL);  Surgeon: Orson Ape, MD;  Location: [facility];  Service: Urology;  Laterality: Right;   LEFT HEART  CATHETERIZATION WITH CORONARY ANGIOGRAM Right 06/19/2012   Procedure: LEFT HEART CATHETERIZATION WITH CORONARY ANGIOGRAM;  Surgeon: Lesleigh Noe, MD;  Location: [facility] CATH LAB;  Service: Cardiovascular;  Laterality: Right;   PERCUTANEOUS CORONARY STENT INTERVENTION (PCI-S) Right 06/19/2012   Procedure: PERCUTANEOUS CORONARY STENT INTERVENTION (PCI-S);  Surgeon: Lesleigh Noe, MD;  Location: [facility] CATH LAB;  Service: Cardiovascular;  Laterality: Right;   URETEROSCOPY WITH HOLMIUM LASER LITHOTRIPSY Right 02/24/2018   Procedure: URETEROSCOPY;  Surgeon: Orson Ape, MD;  Location: [facility];  Service: Urology;  Laterality: Right;     Current Outpatient Medications  Medication Sig Dispense Refill   albuterol (VENTOLIN HFA) 108 (90 Base) MCG/ACT inhaler Inhale 2 puffs into the lungs every 6 (six) hours as needed for wheezing or shortness of breath.     aspirin EC 81 MG EC tablet Take 1 tablet (81 mg total) by mouth daily. Swallow whole. 30 tablet 11   atorvastatin (LIPITOR) 40 MG tablet Take 1 tablet (40 mg total) by mouth daily. 90 tablet 2   carvedilol (COREG) 6.25 MG tablet Take 1 tablet (6.25 mg total) by mouth 2 (two) times daily. 180 tablet 2   dapagliflozin propanediol (FARXIGA) 10 MG TABS tablet Take 1 tablet (10 mg total) by mouth daily before breakfast. (Patient taking differently: Take 5 mg by mouth daily before breakfast.) 90 tablet 2   sacubitril-valsartan (ENTRESTO) 24-26 MG Take 1 tablet by mouth 2 (two) times daily. 180 tablet 3   tamsulosin (FLOMAX) 0.4 MG CAPS capsule Take 1 capsule (0.4 mg total) by mouth 2 (two) times daily. 180 capsule 3   No current facility-administered medications for this visit.    Allergies:   Nucynta [tapentadol], Spironolactone, and Codeine    Social History:   reports that he has been smoking cigarettes. He has a 20 pack-year smoking history. He has never used smokeless tobacco. He reports current alcohol use. He reports that he does not use  drugs.   Family History:  family history includes Cancer in his father; Heart attack in his brother; Heart murmur in his mother.    ROS:     Review of Systems  Constitutional: Negative.   HENT: Negative.    Eyes: Negative.   Respiratory: Negative.    Gastrointestinal: Negative.   Genitourinary: Negative.   Musculoskeletal: Negative.   Skin: Negative.   Neurological: Negative.   Endo/Heme/Allergies: Negative.   Psychiatric/Behavioral: Negative.    All other systems reviewed and are negative.     All other systems are reviewed and negative.    PHYSICAL EXAM: VS:  BP 110/73   Pulse 61   Ht 6' (1.829 m)   Wt 206 lb 3.2 oz (93.5 kg)   SpO2 97%   BMI 27.97 kg/m  , BMI Body mass index is 27.97 kg/m. Last weight:  Wt Readings from Last 3 Encounters:  02/03/23 206 lb 3.2 oz (93.5 kg)  01/09/23 206 lb (93.4 kg)  11/25/22 206 lb 12.8 oz (93.8 kg)     Physical Exam Vitals reviewed.  Constitutional:  Appearance: Normal appearance. He is normal weight.  HENT:     Head: Normocephalic.     Nose: Nose normal.     Mouth/Throat:     Mouth: Mucous membranes are moist.  Eyes:     Pupils: Pupils are equal, round, and reactive to light.  Cardiovascular:     Rate and Rhythm: Normal rate and regular rhythm.     Pulses: Normal pulses.     Heart sounds: Normal heart sounds.  Pulmonary:     Effort: Pulmonary effort is normal.  Abdominal:     General: Abdomen is flat. Bowel sounds are normal.  Musculoskeletal:        General: Normal range of motion.     Cervical back: Normal range of motion.  Skin:    General: Skin is warm.  Neurological:     General: No focal deficit present.     Mental Status: He is alert.  Psychiatric:        Mood and Affect: Mood normal.       EKG:   Recent Labs: 01/29/2023: ALT 16; BUN 17; Creatinine, Ser 1.04; Hemoglobin 15.0; Platelets 154; Potassium 4.1; Sodium 148; TSH 0.964    Lipid Panel    Component Value Date/Time   CHOL 134  01/29/2023 1543   TRIG 127 01/29/2023 1543   HDL 48 01/29/2023 1543   CHOLHDL 3.0 05/09/2022 0958   CHOLHDL 3.5 03/12/2015 0420   VLDL 24 03/12/2015 0420   LDLCALC 64 01/29/2023 1543      Other studies Reviewed: Additional studies/ records that were reviewed today include:  Review of the above records demonstrates:       No data to display            ASSESSMENT AND PLAN:    ICD-10-CM   1. Paroxysmal atrial fibrillation (HCC)  I48.0 CT CORONARY MORPH W/CTA COR W/SCORE W/CA W/CM &/OR WO/CM    2. Atherosclerosis of native coronary artery of native heart without angina pectoris  I25.10 CT CORONARY MORPH W/CTA COR W/SCORE W/CA W/CM &/OR WO/CM    3. HFrEF (heart failure with reduced ejection fraction) (HCC)  I50.20 CT CORONARY MORPH W/CTA COR W/SCORE W/CA W/CM &/OR WO/CM    4. Old MI (myocardial infarction)  I25.2 CT CORONARY MORPH W/CTA COR W/SCORE W/CA W/CM &/OR WO/CM    5. Chest pain due to myocardial ischemia, unspecified ischemic chest pain type  I25.9 CT CORONARY MORPH W/CTA COR W/SCORE W/CA W/CM &/OR WO/CM    6. Other chest pain  R07.89 CT CORONARY MORPH W/CTA COR W/SCORE W/CA W/CM &/OR WO/CM   Had infarction in LAD/RCA territory with EF 34%, advise ccta as has stents       Problem List Items Addressed This Visit       Cardiovascular and Mediastinum   Old MI (myocardial infarction)   Relevant Orders   CT CORONARY MORPH W/CTA COR W/SCORE W/CA W/CM &/OR WO/CM   Coronary atherosclerosis of native coronary artery   Relevant Orders   CT CORONARY MORPH W/CTA COR W/SCORE W/CA W/CM &/OR WO/CM   Atrial fibrillation (HCC) - Primary   Relevant Orders   CT CORONARY MORPH W/CTA COR W/SCORE W/CA W/CM &/OR WO/CM   HFrEF (heart failure with reduced ejection fraction) (HCC)   Relevant Orders   CT CORONARY MORPH W/CTA COR W/SCORE W/CA W/CM &/OR WO/CM   Other Visit Diagnoses     Chest pain due to myocardial ischemia, unspecified ischemic chest pain type  Relevant  Orders   CT CORONARY MORPH W/CTA COR W/SCORE W/CA W/CM &/OR WO/CM   Other chest pain       Had infarction in LAD/RCA territory with EF 34%, advise ccta as has stents   Relevant Orders   CT CORONARY MORPH W/CTA COR W/SCORE W/CA W/CM &/OR WO/CM          Disposition:   Return in about 2 weeks (around 02/17/2023) for ccta and f/u.    Total time spent: 35 minutes  Signed,  Adrian Blackwater, MD  02/03/2023 11:58 AM    [facility]

## 2023-02-07 ENCOUNTER — Ambulatory Visit
Admission: RE | Admit: 2023-02-07 | Discharge: 2023-02-07 | Disposition: A | Discharge status: Home / Self Care | Payer: Medicare Other | Source: Ambulatory Visit | Attending: Cardiology | Admitting: Cardiology

## 2023-02-07 ENCOUNTER — Ambulatory Visit
Admission: RE | Admit: 2023-02-07 | Discharge: 2023-02-07 | Disposition: A | Discharge status: Home / Self Care | Payer: Medicare Other | Attending: Cardiology | Admitting: Cardiology

## 2023-02-07 ENCOUNTER — Ambulatory Visit (INDEPENDENT_AMBULATORY_CARE_PROVIDER_SITE_OTHER): Payer: Medicare Other | Admitting: Cardiology

## 2023-02-07 ENCOUNTER — Encounter: Payer: Self-pay | Admitting: Cardiology

## 2023-02-07 VITALS — BP 118/82 | HR 71 | Ht 72.0 in | Wt 205.6 lb

## 2023-02-07 DIAGNOSIS — R0989 Other specified symptoms and signs involving the circulatory and respiratory systems: Secondary | ICD-10-CM | POA: Insufficient documentation

## 2023-02-07 DIAGNOSIS — R062 Wheezing: Secondary | ICD-10-CM | POA: Diagnosis not present

## 2023-02-07 NOTE — Progress Notes (Signed)
Established Patient Office Visit  Subjective:  Patient ID: [name] Walker, male    DOB: 1955/08/19  Age: 67 y.o. MRN: [id number]  Chief Complaint  Patient presents with   Acute Visit    Chest congestion, started about a week ago. Center of chest, worse when lying down.     Patient in office complaining of chest congestion, started about a week ago. Worse when laying down. Denies chest pain, shortness of breath, cough, sore throat. Patient states he works outside in the heat, spends some time in the air conditioning, feels that is contributing to his chest congestion. Patient states he has not taken anything OTC for his symptoms. Will order a chest xray to rule out infectious process. Recommend picking up OTC Coricidin HBP. Patient continues to smoke but states he is trying to quit.     No other concerns at this time.   Past Medical History:  Diagnosis Date   Bronchitis    CHRONIC   Chronic HFrEF (heart failure with reduced ejection fraction) (HCC)    a. 03/2021 Echo: EF 30-35%, GrI DD.   Coronary artery disease    a. 05/2012 Ant STEMI/PCI: LAD 100p (thrombectomy and 3.5x20 Veriflex BMS); b. 12/2014 Cath: patent stent; c. 03/2015 Cath: LM nl, LAD 10 ISR, 57m, D1 50p, LCX 20 diff throughout, RCA nl-->Med rx.   Erectile dysfunction    History of kidney stones    HOH (hard of hearing)    Hypercholesterolemia    Hypertension    Ischemic cardiomyopathy    a. 11/2015 Echo: EF 30-35%, diff HK; b. Refused ICD; c. 03/2021 Echo: EF 30-35%, GrI DD, nl RV fxn, mild MR.   MI (myocardial infarction) (HCC) 06/19/2012   being considered for defibilator but patient now refuses this.   Nephrolithiasis    NSTEMI (non-ST elevated myocardial infarction) (HCC) 03/2015   Patent BMS - LAD stent.   Sleep apnea    Tobacco abuse    Varicose veins     Past Surgical History:  Procedure Laterality Date   CARDIAC CATHETERIZATION N/A 01/09/2015   Procedure: Left Heart Cath and Coronary Angiography;   Surgeon: Lyn Records, MD;  Location: [facility] INVASIVE CV LAB;  Service: Cardiovascular;  Laterality: N/A;   CARDIAC CATHETERIZATION N/A 03/13/2015   Procedure: Left Heart Cath and Coronary Angiography;  Surgeon: Lyn Records, MD;  Location: [facility] INVASIVE CV LAB;  Service: Cardiovascular;  Laterality: N/A;   CORONARY ANGIOPLASTY     STENT   CORONARY STENT PLACEMENT     CYSTOSCOPY W/ URETERAL STENT REMOVAL Right 05/13/2017   Procedure: CYSTOSCOPY WITH STENT REMOVAL;  Surgeon: Orson Ape, MD;  Location: [facility];  Service: Urology;  Laterality: Right;   CYSTOSCOPY WITH STENT PLACEMENT Right 03/07/2015   Procedure: CYSTOSCOPY WITH STENT PLACEMENT;  Surgeon: Orson Ape, MD;  Location: [facility];  Service: Urology;  Laterality: Right;   CYSTOSCOPY WITH STENT PLACEMENT Right 03/25/2017   Procedure: CYSTOSCOPY WITH STENT PLACEMENT;  Surgeon: Orson Ape, MD;  Location: [facility];  Service: Urology;  Laterality: Right;   CYSTOSCOPY WITH STENT PLACEMENT Right 07/12/2021   Procedure: CYSTOSCOPY WITH STENT PLACEMENT;  Surgeon: Orson Ape, MD;  Location: [facility];  Service: Urology;  Laterality: Right;   CYSTOSCOPY/RETROGRADE/URETEROSCOPY Right 02/24/2018   Procedure: CYSTOSCOPY/RETROGRADE/URETEROSCOPY;  Surgeon: Orson Ape, MD;  Location: [facility];  Service: Urology;  Laterality: Right;   CYSTOSCOPY/RETROGRADE/URETEROSCOPY/STONE EXTRACTION WITH BASKET Right 03/25/2017   Procedure: CYSTOSCOPY/RETROGRADE/URETEROSCOPY/STONE EXTRACTION WITH BASKET;  Surgeon:  Orson Ape, MD;  Location: [facility];  Service: Urology;  Laterality: Right;   EXTRACORPOREAL SHOCK WAVE LITHOTRIPSY Right 03/09/2015   Procedure: EXTRACORPOREAL SHOCK WAVE LITHOTRIPSY (ESWL);  Surgeon: Orson Ape, MD;  Location: [facility];  Service: Urology;  Laterality: Right;   EXTRACORPOREAL SHOCK WAVE LITHOTRIPSY Right 05/11/2015   Procedure: EXTRACORPOREAL SHOCK WAVE LITHOTRIPSY (ESWL);  Surgeon: Orson Ape, MD;  Location:  [facility];  Service: Urology;  Laterality: Right;   EXTRACORPOREAL SHOCK WAVE LITHOTRIPSY Right 04/10/2017   Procedure: EXTRACORPOREAL SHOCK WAVE LITHOTRIPSY (ESWL);  Surgeon: Orson Ape, MD;  Location: [facility];  Service: Urology;  Laterality: Right;   EXTRACORPOREAL SHOCK WAVE LITHOTRIPSY Right 07/26/2021   Procedure: EXTRACORPOREAL SHOCK WAVE LITHOTRIPSY (ESWL);  Surgeon: Orson Ape, MD;  Location: [facility];  Service: Urology;  Laterality: Right;   LEFT HEART CATHETERIZATION WITH CORONARY ANGIOGRAM Right 06/19/2012   Procedure: LEFT HEART CATHETERIZATION WITH CORONARY ANGIOGRAM;  Surgeon: Lesleigh Noe, MD;  Location: [facility] CATH LAB;  Service: Cardiovascular;  Laterality: Right;   PERCUTANEOUS CORONARY STENT INTERVENTION (PCI-S) Right 06/19/2012   Procedure: PERCUTANEOUS CORONARY STENT INTERVENTION (PCI-S);  Surgeon: Lesleigh Noe, MD;  Location: [facility] CATH LAB;  Service: Cardiovascular;  Laterality: Right;   URETEROSCOPY WITH HOLMIUM LASER LITHOTRIPSY Right 02/24/2018   Procedure: URETEROSCOPY;  Surgeon: Orson Ape, MD;  Location: [facility];  Service: Urology;  Laterality: Right;    Social History   Socioeconomic History   Marital status: Single    Spouse name: Not on file   Number of children: Not on file   Years of education: Not on file   Highest education level: Not on file  Occupational History   Not on file  Tobacco Use   Smoking status: Every Day    Current packs/day: 0.50    Average packs/day: 0.5 packs/day for 40.0 years (20.0 ttl pk-yrs)    Types: Cigarettes   Smokeless tobacco: Never  Vaping Use   Vaping status: Never Used  Substance and Sexual Activity   Alcohol use: Yes    Alcohol/week: 0.0 standard drinks of alcohol    Comment: occ   Drug use: No   Sexual activity: Not on file  Other Topics Concern   Not on file  Social History Narrative   Not on file   Social Determinants of Health   Financial Resource Strain: Not on file  Food  Insecurity: Not on file  Transportation Needs: Not on file  Physical Activity: Not on file  Stress: Not on file  Social Connections: Not on file  Intimate Partner Violence: Not on file    Family History  Problem Relation Age of Onset   Cancer Father    Heart murmur Mother    Heart attack Brother     Allergies  Allergen Reactions   Nucynta [Tapentadol] Nausea Only and Other (See Comments)    Dizziness, woozy.  Sick to his stomach   Spironolactone     Diarrhea/headache   Codeine Other (See Comments)    Too drowsy    Review of Systems  Constitutional: Negative.   HENT: Negative.  Negative for congestion, ear discharge, ear pain, sinus pain and sore throat.   Eyes: Negative.   Respiratory:  Negative for cough, sputum production and shortness of breath.   Cardiovascular: Negative.  Negative for chest pain.  Gastrointestinal: Negative.  Negative for abdominal pain, constipation and diarrhea.  Genitourinary: Negative.   Musculoskeletal:  Negative for joint pain and myalgias.  Skin: Negative.   Neurological: Negative.  Negative for dizziness and headaches.  Endo/Heme/Allergies: Negative.   All other systems reviewed and are negative.      Objective:   BP 118/82   Pulse 71   Ht 6' (1.829 m)   Wt 205 lb 9.6 oz (93.3 kg)   SpO2 96%   BMI 27.88 kg/m   Vitals:   02/07/23 1419  BP: 118/82  Pulse: 71  Height: 6' (1.829 m)  Weight: 205 lb 9.6 oz (93.3 kg)  SpO2: 96%  BMI (Calculated): 27.88    Physical Exam Nursing note reviewed.  Constitutional:      Appearance: Normal appearance. He is normal weight.  HENT:     Head: Normocephalic and atraumatic.     Nose: Nose normal.     Mouth/Throat:     Mouth: Mucous membranes are moist.     Pharynx: Oropharynx is clear.  Eyes:     Extraocular Movements: Extraocular movements intact.     Conjunctiva/sclera: Conjunctivae normal.     Pupils: Pupils are equal, round, and reactive to light.  Cardiovascular:     Rate and  Rhythm: Normal rate and regular rhythm.     Pulses: Normal pulses.     Heart sounds: Normal heart sounds.  Pulmonary:     Effort: Pulmonary effort is normal. No respiratory distress.     Breath sounds: Stridor present. Wheezing present.  Abdominal:     General: Abdomen is flat. Bowel sounds are normal.     Palpations: Abdomen is soft.  Musculoskeletal:        General: Normal range of motion.     Cervical back: Normal range of motion.  Skin:    General: Skin is warm and dry.  Neurological:     General: No focal deficit present.     Mental Status: He is alert and oriented to person, place, and time.  Psychiatric:        Mood and Affect: Mood normal.        Behavior: Behavior normal.        Thought Content: Thought content normal.        Judgment: Judgment normal.      No results found for any visits on 02/07/23.  Recent Results (from the past 2160 hour(s))  Urinalysis, Complete     Status: Abnormal   Collection Time: 01/09/23  2:42 PM  Result Value Ref Range   Specific Gravity, UA 1.020 1.005 - 1.030   pH, UA 5.5 5.0 - 7.5   Color, UA Yellow Yellow   Appearance Ur Clear Clear   Leukocytes,UA Negative Negative   Protein,UA Negative Negative/Trace   Glucose, UA 2+ (A) Negative   Ketones, UA Negative Negative   RBC, UA 3+ (A) Negative   Bilirubin, UA Negative Negative   Urobilinogen, Ur 0.2 0.2 - 1.0 mg/dL   Nitrite, UA Negative Negative   Microscopic Examination See below:   Microscopic Examination     Status: Abnormal   Collection Time: 01/09/23  2:42 PM   Urine  Result Value Ref Range   WBC, UA 0-5 0 - 5 /hpf   RBC, Urine >30 (A) 0 - 2 /hpf   Epithelial Cells (non renal) 0-10 0 - 10 /hpf   Bacteria, UA Few None seen/Few  CBC with Differential/Platelet     Status: Abnormal   Collection Time: 01/29/23  3:43 PM  Result Value Ref Range   WBC 7.8 3.4 - 10.8 x10E3/uL   RBC 4.47 4.14 -  5.80 x10E6/uL   Hemoglobin 15.0 13.0 - 17.7 g/dL   Hematocrit 11.9 14.7 - 51.0 %    MCV 98 (H) 79 - 97 fL   MCH 33.6 (H) 26.6 - 33.0 pg   MCHC 34.2 31.5 - 35.7 g/dL   RDW 82.9 56.2 - 13.0 %   Platelets 154 150 - 450 x10E3/uL   Neutrophils 56 Not Estab. %   Lymphs 35 Not Estab. %   Monocytes 6 Not Estab. %   Eos 2 Not Estab. %   Basos 1 Not Estab. %   Neutrophils Absolute 4.5 1.4 - 7.0 x10E3/uL   Lymphocytes Absolute 2.7 0.7 - 3.1 x10E3/uL   Monocytes Absolute 0.5 0.1 - 0.9 x10E3/uL   EOS (ABSOLUTE) 0.1 0.0 - 0.4 x10E3/uL   Basophils Absolute 0.1 0.0 - 0.2 x10E3/uL   Immature Granulocytes 0 Not Estab. %   Immature Grans (Abs) 0.0 0.0 - 0.1 x10E3/uL  Comprehensive metabolic panel     Status: Abnormal   Collection Time: 01/29/23  3:43 PM  Result Value Ref Range   Glucose 112 (H) 70 - 99 mg/dL   BUN 17 8 - 27 mg/dL   Creatinine, Ser 8.65 0.76 - 1.27 mg/dL   eGFR 80 >78 IO/NGE/9.52   BUN/Creatinine Ratio 16 10 - 24   Sodium 148 (H) 134 - 144 mmol/L   Potassium 4.1 3.5 - 5.2 mmol/L   Chloride 108 (H) 96 - 106 mmol/L   CO2 24 20 - 29 mmol/L   Calcium 9.5 8.6 - 10.2 mg/dL   Total Protein 6.3 6.0 - 8.5 g/dL   Albumin 4.4 3.9 - 4.9 g/dL   Globulin, Total 1.9 1.5 - 4.5 g/dL   Bilirubin Total 1.0 0.0 - 1.2 mg/dL   Alkaline Phosphatase 67 44 - 121 IU/L   AST 16 0 - 40 IU/L   ALT 16 0 - 44 IU/L  Lipid Panel w/o Chol/HDL Ratio     Status: None   Collection Time: 01/29/23  3:43 PM  Result Value Ref Range   Cholesterol, Total 134 100 - 199 mg/dL   Triglycerides 841 0 - 149 mg/dL   HDL 48 >32 mg/dL   VLDL Cholesterol Cal 22 5 - 40 mg/dL   LDL Chol Calc (NIH) 64 0 - 99 mg/dL  PSA, SERUM (SERIAL MONITOR)     Status: None   Collection Time: 01/29/23  3:43 PM  Result Value Ref Range   Prostate Specific Ag, Serum 0.5 0.0 - 4.0 ng/mL    Comment: Roche ECLIA methodology. According to the American Urological Association, Serum PSA should decrease and remain at undetectable levels after radical prostatectomy. The AUA defines biochemical recurrence as an initial PSA  value 0.2 ng/mL or greater followed by a subsequent confirmatory PSA value 0.2 ng/mL or greater. Values obtained with different assay methods or kits cannot be used interchangeably. Results cannot be interpreted as absolute evidence of the presence or absence of malignant disease.   Hgb A1c w/o eAG     Status: None   Collection Time: 01/29/23  3:43 PM  Result Value Ref Range   Hgb A1c MFr Bld 5.6 4.8 - 5.6 %    Comment:          Prediabetes: 5.7 - 6.4          Diabetes: >6.4          Glycemic control for adults with diabetes: <7.0   TSH     Status: None   Collection Time: 01/29/23  3:43 PM  Result Value Ref Range   TSH 0.964 0.450 - 4.500 uIU/mL      Assessment & Plan:  Chest xray today. OTC Coricidin HBP.  Quit smoking.   Problem List Items Addressed This Visit       Respiratory   Chest congestion   Relevant Orders   DG Chest 2 View     Other   Wheezing on exhalation - Primary   Relevant Orders   DG Chest 2 View    No follow-ups on file.   Total time spent: 30 minutes  [employer], NP  02/07/2023   This document may have been prepared by Dragon Voice Recognition software and as such may include unintentional dictation errors.

## 2023-02-17 ENCOUNTER — Other Ambulatory Visit: Payer: Self-pay | Admitting: Cardiology

## 2023-02-17 DIAGNOSIS — Z720 Tobacco use: Secondary | ICD-10-CM

## 2023-02-17 DIAGNOSIS — J439 Emphysema, unspecified: Secondary | ICD-10-CM

## 2023-02-25 ENCOUNTER — Other Ambulatory Visit: Payer: Medicare Other

## 2023-03-04 NOTE — Progress Notes (Signed)
Patient notified

## 2023-03-07 ENCOUNTER — Ambulatory Visit: Payer: Medicare Other | Admitting: Cardiovascular Disease

## 2023-03-11 ENCOUNTER — Other Ambulatory Visit: Payer: Medicare Other

## 2023-03-14 ENCOUNTER — Ambulatory Visit: Payer: Medicare Other | Admitting: Cardiology

## 2023-03-25 ENCOUNTER — Ambulatory Visit: Payer: Medicare Other

## 2023-03-25 DIAGNOSIS — I480 Paroxysmal atrial fibrillation: Secondary | ICD-10-CM | POA: Diagnosis not present

## 2023-03-25 DIAGNOSIS — I2510 Atherosclerotic heart disease of native coronary artery without angina pectoris: Secondary | ICD-10-CM

## 2023-03-25 DIAGNOSIS — I5020 Unspecified systolic (congestive) heart failure: Secondary | ICD-10-CM

## 2023-03-25 DIAGNOSIS — I252 Old myocardial infarction: Secondary | ICD-10-CM | POA: Diagnosis not present

## 2023-03-25 DIAGNOSIS — I259 Chronic ischemic heart disease, unspecified: Secondary | ICD-10-CM

## 2023-03-25 DIAGNOSIS — R0789 Other chest pain: Secondary | ICD-10-CM

## 2023-03-25 MED ORDER — IOHEXOL 300 MG/ML  SOLN
100.0000 mL | Freq: Once | INTRAMUSCULAR | Status: AC | PRN
  Administered 2023-03-25: 100 mL via INTRAVENOUS

## 2023-03-27 ENCOUNTER — Ambulatory Visit: Payer: Medicare Other | Admitting: Cardiology

## 2023-03-28 ENCOUNTER — Ambulatory Visit: Payer: Medicare Other | Admitting: Cardiovascular Disease

## 2023-04-03 ENCOUNTER — Ambulatory Visit: Payer: Medicare Other | Admitting: Cardiovascular Disease

## 2023-04-03 ENCOUNTER — Encounter: Payer: Self-pay | Admitting: Cardiovascular Disease

## 2023-04-03 VITALS — BP 110/65 | HR 64 | Ht 72.0 in | Wt 205.2 lb

## 2023-04-03 DIAGNOSIS — I2510 Atherosclerotic heart disease of native coronary artery without angina pectoris: Secondary | ICD-10-CM

## 2023-04-03 DIAGNOSIS — I5020 Unspecified systolic (congestive) heart failure: Secondary | ICD-10-CM | POA: Diagnosis not present

## 2023-04-03 DIAGNOSIS — F17219 Nicotine dependence, cigarettes, with unspecified nicotine-induced disorders: Secondary | ICD-10-CM | POA: Diagnosis not present

## 2023-04-03 DIAGNOSIS — Z720 Tobacco use: Secondary | ICD-10-CM

## 2023-04-03 DIAGNOSIS — E785 Hyperlipidemia, unspecified: Secondary | ICD-10-CM | POA: Diagnosis not present

## 2023-04-03 DIAGNOSIS — I480 Paroxysmal atrial fibrillation: Secondary | ICD-10-CM | POA: Diagnosis not present

## 2023-04-03 NOTE — Progress Notes (Signed)
Cardiology Office Note   Date:  04/03/2023   ID:  [name] Walker, DOB [birth year], MRN [id number]  PCP:  System, Provider Not In  Cardiologist:  Adrian Blackwater, MD      History of Present Illness: [name] Walker is a 67 y.o. male who presents for  Chief Complaint  Patient presents with   Follow-up    CCTA results    Doing well      Past Medical History:  Diagnosis Date   Bronchitis    CHRONIC   Chronic HFrEF (heart failure with reduced ejection fraction) (HCC)    a. 03/2021 Echo: EF 30-35%, GrI DD.   Coronary artery disease    a. 05/2012 Ant STEMI/PCI: LAD 100p (thrombectomy and 3.5x20 Veriflex BMS); b. 12/2014 Cath: patent stent; c. 03/2015 Cath: LM nl, LAD 10 ISR, 54m, D1 50p, LCX 20 diff throughout, RCA nl-->Med rx.   Erectile dysfunction    History of kidney stones    HOH (hard of hearing)    Hypercholesterolemia    Hypertension    Ischemic cardiomyopathy    a. 11/2015 Echo: EF 30-35%, diff HK; b. Refused ICD; c. 03/2021 Echo: EF 30-35%, GrI DD, nl RV fxn, mild MR.   MI (myocardial infarction) (HCC) 06/19/2012   being considered for defibilator but patient now refuses this.   Nephrolithiasis    NSTEMI (non-ST elevated myocardial infarction) (HCC) 03/2015   Patent BMS - LAD stent.   Sleep apnea    Tobacco abuse    Varicose veins      Past Surgical History:  Procedure Laterality Date   CARDIAC CATHETERIZATION N/A 01/09/2015   Procedure: Left Heart Cath and Coronary Angiography;  Surgeon: Lyn Records, MD;  Location: [facility]VASIVE CV LAB;  Service: Cardiovascular;  Laterality: N/A;   CARDIAC CATHETERIZATION N/A 03/13/2015   Procedure: Left Heart Cath and Coronary Angiography;  Surgeon: Lyn Records, MD;  Location: [facility] INVASIVE CV LAB;  Service: Cardiovascular;  Laterality: N/A;   CORONARY ANGIOPLASTY     STENT   CORONARY STENT PLACEMENT     CYSTOSCOPY W/ URETERAL STENT REMOVAL Right 05/13/2017   Procedure: CYSTOSCOPY WITH STENT REMOVAL;  Surgeon: Orson Ape, MD;  Location: [facility];  Service: Urology;  Laterality: Right;   CYSTOSCOPY WITH STENT PLACEMENT Right 03/07/2015   Procedure: CYSTOSCOPY WITH STENT PLACEMENT;  Surgeon: Orson Ape, MD;  Location: [facility];  Service: Urology;  Laterality: Right;   CYSTOSCOPY WITH STENT PLACEMENT Right 03/25/2017   Procedure: CYSTOSCOPY WITH STENT PLACEMENT;  Surgeon: Orson Ape, MD;  Location: [facility];  Service: Urology;  Laterality: Right;   CYSTOSCOPY WITH STENT PLACEMENT Right 07/12/2021   Procedure: CYSTOSCOPY WITH STENT PLACEMENT;  Surgeon: Orson Ape, MD;  Location: [facility];  Service: Urology;  Laterality: Right;   CYSTOSCOPY/RETROGRADE/URETEROSCOPY Right 02/24/2018   Procedure: CYSTOSCOPY/RETROGRADE/URETEROSCOPY;  Surgeon: Orson Ape, MD;  Location: [facility];  Service: Urology;  Laterality: Right;   CYSTOSCOPY/RETROGRADE/URETEROSCOPY/STONE EXTRACTION WITH BASKET Right 03/25/2017   Procedure: CYSTOSCOPY/RETROGRADE/URETEROSCOPY/STONE EXTRACTION WITH BASKET;  Surgeon: Orson Ape, MD;  Location: [facility];  Service: Urology;  Laterality: Right;   EXTRACORPOREAL SHOCK WAVE LITHOTRIPSY Right 03/09/2015   Procedure: EXTRACORPOREAL SHOCK WAVE LITHOTRIPSY (ESWL);  Surgeon: Orson Ape, MD;  Location: [facility];  Service: Urology;  Laterality: Right;   EXTRACORPOREAL SHOCK WAVE LITHOTRIPSY Right 05/11/2015   Procedure: EXTRACORPOREAL SHOCK WAVE LITHOTRIPSY (ESWL);  Surgeon: Orson Ape, MD;  Location: [facility];  Service: Urology;  Laterality: Right;   EXTRACORPOREAL SHOCK WAVE LITHOTRIPSY Right 04/10/2017   Procedure: EXTRACORPOREAL SHOCK WAVE LITHOTRIPSY (ESWL);  Surgeon: Orson Ape, MD;  Location: [facility];  Service: Urology;  Laterality: Right;   EXTRACORPOREAL SHOCK WAVE LITHOTRIPSY Right 07/26/2021   Procedure: EXTRACORPOREAL SHOCK WAVE LITHOTRIPSY (ESWL);  Surgeon: Orson Ape, MD;  Location: [facility];  Service: Urology;  Laterality: Right;   LEFT HEART CATHETERIZATION  WITH CORONARY ANGIOGRAM Right 06/19/2012   Procedure: LEFT HEART CATHETERIZATION WITH CORONARY ANGIOGRAM;  Surgeon: Lesleigh Noe, MD;  Location: [facility] CATH LAB;  Service: Cardiovascular;  Laterality: Right;   PERCUTANEOUS CORONARY STENT INTERVENTION (PCI-S) Right 06/19/2012   Procedure: PERCUTANEOUS CORONARY STENT INTERVENTION (PCI-S);  Surgeon: Lesleigh Noe, MD;  Location: [facility] CATH LAB;  Service: Cardiovascular;  Laterality: Right;   URETEROSCOPY WITH HOLMIUM LASER LITHOTRIPSY Right 02/24/2018   Procedure: URETEROSCOPY;  Surgeon: Orson Ape, MD;  Location: [facility];  Service: Urology;  Laterality: Right;     Current Outpatient Medications  Medication Sig Dispense Refill   albuterol (VENTOLIN HFA) 108 (90 Base) MCG/ACT inhaler Inhale 2 puffs into the lungs every 6 (six) hours as needed for wheezing or shortness of breath.     aspirin EC 81 MG EC tablet Take 1 tablet (81 mg total) by mouth daily. Swallow whole. 30 tablet 11   atorvastatin (LIPITOR) 40 MG tablet Take 1 tablet (40 mg total) by mouth daily. 90 tablet 2   carvedilol (COREG) 6.25 MG tablet Take 1 tablet (6.25 mg total) by mouth 2 (two) times daily. 180 tablet 2   dapagliflozin propanediol (FARXIGA) 10 MG TABS tablet Take 1 tablet (10 mg total) by mouth daily before breakfast. (Patient taking differently: Take 5 mg by mouth daily before breakfast.) 90 tablet 2   sacubitril-valsartan (ENTRESTO) 24-26 MG Take 1 tablet by mouth 2 (two) times daily. 180 tablet 3   tamsulosin (FLOMAX) 0.4 MG CAPS capsule Take 1 capsule (0.4 mg total) by mouth 2 (two) times daily. 180 capsule 3   No current facility-administered medications for this visit.    Allergies:   Nucynta [tapentadol], Spironolactone, and Codeine    Social History:   reports that he has been smoking cigarettes. He has a 20 pack-year smoking history. He has never used smokeless tobacco. He reports current alcohol use. He reports that he does not use drugs.   Family  History:  family history includes Cancer in his father; Heart attack in his brother; Heart murmur in his mother.    ROS:     Review of Systems  Constitutional: Negative.   HENT: Negative.    Eyes: Negative.   Respiratory: Negative.    Gastrointestinal: Negative.   Genitourinary: Negative.   Musculoskeletal: Negative.   Skin: Negative.   Neurological: Negative.   Endo/Heme/Allergies: Negative.   Psychiatric/Behavioral: Negative.    All other systems reviewed and are negative.     All other systems are reviewed and negative.    PHYSICAL EXAM: VS:  BP 110/65   Pulse 64   Ht 6' (1.829 m)   Wt 205 lb 3.2 oz (93.1 kg)   SpO2 94%   BMI 27.83 kg/m  , BMI Body mass index is 27.83 kg/m. Last weight:  Wt Readings from Last 3 Encounters:  04/03/23 205 lb 3.2 oz (93.1 kg)  02/07/23 205 lb 9.6 oz (93.3 kg)  02/03/23 206 lb 3.2 oz (93.5 kg)     Physical Exam Vitals reviewed.  Constitutional:  Appearance: Normal appearance. He is normal weight.  HENT:     Head: Normocephalic.     Nose: Nose normal.     Mouth/Throat:     Mouth: Mucous membranes are moist.  Eyes:     Pupils: Pupils are equal, round, and reactive to light.  Cardiovascular:     Rate and Rhythm: Normal rate and regular rhythm.     Pulses: Normal pulses.     Heart sounds: Normal heart sounds.  Pulmonary:     Effort: Pulmonary effort is normal.  Abdominal:     General: Abdomen is flat. Bowel sounds are normal.  Musculoskeletal:        General: Normal range of motion.     Cervical back: Normal range of motion.  Skin:    General: Skin is warm.  Neurological:     General: No focal deficit present.     Mental Status: He is alert.  Psychiatric:        Mood and Affect: Mood normal.       EKG:   Recent Labs: 01/29/2023: ALT 16; BUN 17; Creatinine, Ser 1.04; Hemoglobin 15.0; Platelets 154; Potassium 4.1; Sodium 148; TSH 0.964    Lipid Panel    Component Value Date/Time   CHOL 134 01/29/2023 1543    TRIG 127 01/29/2023 1543   HDL 48 01/29/2023 1543   CHOLHDL 3.0 05/09/2022 0958   CHOLHDL 3.5 03/12/2015 0420   VLDL 24 03/12/2015 0420   LDLCALC 64 01/29/2023 1543      Other studies Reviewed: Additional studies/ records that were reviewed today include:  Review of the above records demonstrates:       No data to display            ASSESSMENT AND PLAN:    ICD-10-CM   1. Tobacco abuse  Z72.0     2. Paroxysmal atrial fibrillation (HCC)  I48.0     3. HFrEF (heart failure with reduced ejection fraction) (HCC)  I50.20    Unable to take aldactone and BP was low    4. Dyslipidemia  E78.5        Problem List Items Addressed This Visit       Cardiovascular and Mediastinum   Atrial fibrillation (HCC)   HFrEF (heart failure with reduced ejection fraction) (HCC)     Other   Tobacco abuse - Primary   Dyslipidemia       Disposition:   No follow-ups on file.    Total time spent: 30 minutes  Signed,  Adrian Blackwater, MD  04/03/2023 3:44 PM    [facility]

## 2023-05-02 ENCOUNTER — Ambulatory Visit
Admission: RE | Admit: 2023-05-02 | Discharge: 2023-05-02 | Disposition: A | Discharge status: Home / Self Care | Payer: Medicare Other | Attending: Urology | Admitting: Urology

## 2023-05-02 ENCOUNTER — Ambulatory Visit
Admission: RE | Admit: 2023-05-02 | Discharge: 2023-05-02 | Disposition: A | Discharge status: Home / Self Care | Payer: Medicare Other | Source: Ambulatory Visit | Attending: Urology | Admitting: Urology

## 2023-05-02 ENCOUNTER — Ambulatory Visit (INDEPENDENT_AMBULATORY_CARE_PROVIDER_SITE_OTHER): Payer: Medicare Other | Admitting: Urology

## 2023-05-02 ENCOUNTER — Other Ambulatory Visit: Payer: Self-pay | Admitting: Urology

## 2023-05-02 ENCOUNTER — Encounter: Payer: Self-pay | Admitting: Urology

## 2023-05-02 VITALS — BP 94/64 | HR 58

## 2023-05-02 DIAGNOSIS — R109 Unspecified abdominal pain: Secondary | ICD-10-CM

## 2023-05-02 DIAGNOSIS — N200 Calculus of kidney: Secondary | ICD-10-CM

## 2023-05-02 DIAGNOSIS — Z8619 Personal history of other infectious and parasitic diseases: Secondary | ICD-10-CM

## 2023-05-02 LAB — URINALYSIS, COMPLETE
Bilirubin, UA: NEGATIVE
Ketones, UA: NEGATIVE
Leukocytes,UA: NEGATIVE
Nitrite, UA: NEGATIVE
Protein,UA: NEGATIVE
Specific Gravity, UA: <1.005 — ABNORMAL LOW (ref 1.005–1.030)
Urobilinogen, Ur: 0.2 mg/dL (ref 0.2–1.0)
pH, UA: 5 (ref 5.0–7.5)

## 2023-05-02 LAB — MICROSCOPIC EXAMINATION
Bacteria, UA: NONE SEEN
Epithelial Cells (non renal): NONE SEEN /[HPF] (ref 0–10)
RBC, Urine: NONE SEEN /[HPF] (ref 0–2)

## 2023-05-02 MED ORDER — OXYCODONE-ACETAMINOPHEN 5-325 MG PO TABS: 1.0000 | ORAL_TABLET | Freq: Three times a day (TID) | ORAL | 0 refills | Status: AC | PRN

## 2023-05-02 MED ORDER — ONDANSETRON HCL 4 MG PO TABS: 4.0000 mg | ORAL_TABLET | Freq: Three times a day (TID) | ORAL | 0 refills | Status: AC | PRN

## 2023-05-02 MED ORDER — SULFAMETHOXAZOLE-TRIMETHOPRIM 800-160 MG PO TABS: 1.0000 | ORAL_TABLET | Freq: Two times a day (BID) | ORAL | 0 refills | Status: DC

## 2023-05-02 NOTE — Progress Notes (Signed)
05/02/2023 10:40 AM   [name] Walker 12-20-1955 [id number]  Referring provider: No referring provider defined for this encounter.  Urological history: 1.  Nephrolithiasis -right URS (2016) and (2019)  -ESWL's x several   2. BPH with LU TS -PSA (12/2022) 0.5  -tamsulosin 0.8 mg daily   Chief Complaint  Patient presents with   Follow-up   HPI: [name] Walker is a 67 y.o. male who presents today for possible stone.    Previous records reviewed.   He has been having right sided flank pain that radiates into the right waist for the past 2 days.  Last night the pain was so intense he had a taking Percocet.  Today, the pain is just an uncomfortableness.   Patient denies any modifying or aggravating factors.  Patient denies any recent UTI's, gross hematuria, dysuria or suprapubic/flank pain.  Patient denies any fevers, chills, nausea or vomiting.    UA Clear   KUB ? 2 mm right lower ureteral stone  PMH: Past Medical History:  Diagnosis Date   Bronchitis    CHRONIC   Chronic HFrEF (heart failure with reduced ejection fraction) (HCC)    a. 03/2021 Echo: EF 30-35%, GrI DD.   Coronary artery disease    a. 05/2012 Ant STEMI/PCI: LAD 100p (thrombectomy and 3.5x20 Veriflex BMS); b. 12/2014 Cath: patent stent; c. 03/2015 Cath: LM nl, LAD 10 ISR, 18m, D1 50p, LCX 20 diff throughout, RCA nl-->Med rx.   Erectile dysfunction    History of kidney stones    HOH (hard of hearing)    Hypercholesterolemia    Hypertension    Ischemic cardiomyopathy    a. 11/2015 Echo: EF 30-35%, diff HK; b. Refused ICD; c. 03/2021 Echo: EF 30-35%, GrI DD, nl RV fxn, mild MR.   MI (myocardial infarction) (HCC) 06/19/2012   being considered for defibilator but patient now refuses this.   Nephrolithiasis    NSTEMI (non-ST elevated myocardial infarction) (HCC) 03/2015   Patent BMS - LAD stent.   Sleep apnea    Tobacco abuse    Varicose veins     Surgical History: Past Surgical History:  Procedure  Laterality Date   CARDIAC CATHETERIZATION N/A 01/09/2015   Procedure: Left Heart Cath and Coronary Angiography;  Surgeon: Lyn Records, MD;  Location: [facility] INVASIVE CV LAB;  Service: Cardiovascular;  Laterality: N/A;   CARDIAC CATHETERIZATION N/A 03/13/2015   Procedure: Left Heart Cath and Coronary Angiography;  Surgeon: Lyn Records, MD;  Location: [facility] INVASIVE CV LAB;  Service: Cardiovascular;  Laterality: N/A;   CORONARY ANGIOPLASTY     STENT   CORONARY STENT PLACEMENT     CYSTOSCOPY W/ URETERAL STENT REMOVAL Right 05/13/2017   Procedure: CYSTOSCOPY WITH STENT REMOVAL;  Surgeon: Orson Ape, MD;  Location: [facility];  Service: Urology;  Laterality: Right;   CYSTOSCOPY WITH STENT PLACEMENT Right 03/07/2015   Procedure: CYSTOSCOPY WITH STENT PLACEMENT;  Surgeon: Orson Ape, MD;  Location: [facility];  Service: Urology;  Laterality: Right;   CYSTOSCOPY WITH STENT PLACEMENT Right 03/25/2017   Procedure: CYSTOSCOPY WITH STENT PLACEMENT;  Surgeon: Orson Ape, MD;  Location: [facility];  Service: Urology;  Laterality: Right;   CYSTOSCOPY WITH STENT PLACEMENT Right 07/12/2021   Procedure: CYSTOSCOPY WITH STENT PLACEMENT;  Surgeon: Orson Ape, MD;  Location: [facility];  Service: Urology;  Laterality: Right;   CYSTOSCOPY/RETROGRADE/URETEROSCOPY Right 02/24/2018   Procedure: CYSTOSCOPY/RETROGRADE/URETEROSCOPY;  Surgeon: Orson Ape, MD;  Location: [facility];  Service: Urology;  Laterality: Right;   CYSTOSCOPY/RETROGRADE/URETEROSCOPY/STONE EXTRACTION WITH BASKET Right 03/25/2017   Procedure: CYSTOSCOPY/RETROGRADE/URETEROSCOPY/STONE EXTRACTION WITH BASKET;  Surgeon: Orson Ape, MD;  Location: [facility];  Service: Urology;  Laterality: Right;   EXTRACORPOREAL SHOCK WAVE LITHOTRIPSY Right 03/09/2015   Procedure: EXTRACORPOREAL SHOCK WAVE LITHOTRIPSY (ESWL);  Surgeon: Orson Ape, MD;  Location: [facility];  Service: Urology;  Laterality: Right;   EXTRACORPOREAL SHOCK WAVE LITHOTRIPSY Right  05/11/2015   Procedure: EXTRACORPOREAL SHOCK WAVE LITHOTRIPSY (ESWL);  Surgeon: Orson Ape, MD;  Location: [facility];  Service: Urology;  Laterality: Right;   EXTRACORPOREAL SHOCK WAVE LITHOTRIPSY Right 04/10/2017   Procedure: EXTRACORPOREAL SHOCK WAVE LITHOTRIPSY (ESWL);  Surgeon: Orson Ape, MD;  Location: [facility];  Service: Urology;  Laterality: Right;   EXTRACORPOREAL SHOCK WAVE LITHOTRIPSY Right 07/26/2021   Procedure: EXTRACORPOREAL SHOCK WAVE LITHOTRIPSY (ESWL);  Surgeon: Orson Ape, MD;  Location: [facility];  Service: Urology;  Laterality: Right;   LEFT HEART CATHETERIZATION WITH CORONARY ANGIOGRAM Right 06/19/2012   Procedure: LEFT HEART CATHETERIZATION WITH CORONARY ANGIOGRAM;  Surgeon: Lesleigh Noe, MD;  Location: [facility] CATH LAB;  Service: Cardiovascular;  Laterality: Right;   PERCUTANEOUS CORONARY STENT INTERVENTION (PCI-S) Right 06/19/2012   Procedure: PERCUTANEOUS CORONARY STENT INTERVENTION (PCI-S);  Surgeon: Lesleigh Noe, MD;  Location: [facility] CATH LAB;  Service: Cardiovascular;  Laterality: Right;   URETEROSCOPY WITH HOLMIUM LASER LITHOTRIPSY Right 02/24/2018   Procedure: URETEROSCOPY;  Surgeon: Orson Ape, MD;  Location: [facility];  Service: Urology;  Laterality: Right;    Home Medications:  Allergies as of 05/02/2023       Reactions   Nucynta [tapentadol] Nausea Only, Other (See Comments)   Dizziness, woozy.  Sick to his stomach   Spironolactone    Diarrhea/headache   Codeine Other (See Comments)   Too drowsy        Medication List        Accurate as of May 02, 2023 10:40 AM. If you have any questions, ask your nurse or doctor.          albuterol 108 (90 Base) MCG/ACT inhaler Commonly known as: VENTOLIN HFA Inhale 2 puffs into the lungs every 6 (six) hours as needed for wheezing or shortness of breath.   aspirin EC 81 MG tablet Take 1 tablet (81 mg total) by mouth daily. Swallow whole.   atorvastatin 40 MG tablet Commonly known  as: LIPITOR Take 1 tablet (40 mg total) by mouth daily.   carvedilol 6.25 MG tablet Commonly known as: COREG Take 1 tablet (6.25 mg total) by mouth 2 (two) times daily.   dapagliflozin propanediol 10 MG Tabs tablet Commonly known as: Farxiga Take 1 tablet (10 mg total) by mouth daily before breakfast. What changed: how much to take   Entresto 24-26 MG Generic drug: sacubitril-valsartan Take 1 tablet by mouth 2 (two) times daily.   ondansetron 4 MG tablet Commonly known as: Zofran Take 1 tablet (4 mg total) by mouth every 8 (eight) hours as needed for nausea or vomiting. Started by: Michiel Cowboy   oxyCODONE-acetaminophen 5-325 MG tablet Commonly known as: Percocet Take 1 tablet by mouth every 8 (eight) hours as needed for severe pain (pain score 7-10). Started by: Michiel Cowboy   sulfamethoxazole-trimethoprim 800-160 MG tablet Commonly known as: BACTRIM DS Take 1 tablet by mouth every 12 (twelve) hours. Started by: Michiel Cowboy   tamsulosin 0.4 MG Caps capsule Commonly known as: Flomax Take 1 capsule (0.4 mg total)  by mouth 2 (two) times daily.        Allergies:  Allergies  Allergen Reactions   Nucynta [Tapentadol] Nausea Only and Other (See Comments)    Dizziness, woozy.  Sick to his stomach   Spironolactone     Diarrhea/headache   Codeine Other (See Comments)    Too drowsy    Family History: Family History  Problem Relation Age of Onset   Cancer Father    Heart murmur Mother    Heart attack Brother     Social History:  reports that he has been smoking cigarettes. He has a 20 pack-year smoking history. He has never used smokeless tobacco. He reports current alcohol use. He reports that he does not use drugs.  ROS: Pertinent ROS in HPI  Physical Exam: BP 94/64   Pulse (!) 58   Constitutional:  Well nourished. Alert and oriented, No acute distress. HEENT: [place] AT, moist mucus membranes.  Trachea midline Cardiovascular: No clubbing, cyanosis, or  edema. Respiratory: Normal respiratory effort, no increased work of breathing. Neurologic: Grossly intact, no focal deficits, moving all 4 extremities. Psychiatric: Normal mood and affect.  Laboratory Data: Lab Results  Component Value Date   WBC 7.8 01/29/2023   HGB 15.0 01/29/2023   HCT 43.9 01/29/2023   MCV 98 (H) 01/29/2023   PLT 154 01/29/2023    Lab Results  Component Value Date   CREATININE 1.04 01/29/2023    Lab Results  Component Value Date   HGBA1C 5.6 01/29/2023    Lab Results  Component Value Date   TSH 0.964 01/29/2023       Component Value Date/Time   CHOL 134 01/29/2023 1543   HDL 48 01/29/2023 1543   CHOLHDL 3.0 05/09/2022 0958   CHOLHDL 3.5 03/12/2015 0420   VLDL 24 03/12/2015 0420   LDLCALC 64 01/29/2023 1543    Lab Results  Component Value Date   AST 16 01/29/2023   Lab Results  Component Value Date   ALT 16 01/29/2023    Urinalysis See HPI and EPIC  I have reviewed the labs.   Pertinent Imaging: KUB possible 2 mm ureteral stone,  radiologist report pending I have independently reviewed the films.    Assessment & Plan:    1. Flank pain -UA clear -urine sent for culture -KUB possible 2 mm right ureteral stone -Discussed that it is potentially not a visible stone and we can get further clarification with CT or he can return in 2 weeks and we can pursue this with medical expulsive therapy in the interim -He would like to return in 2 weeks for follow-up KUB and symptom recheck  2.  Nephrolithiasis -If he captures a fragment we will send it in for analysis -After this acute episode, we will go ahead and pursue a 24-hour metabolic workup  3. History of sepsis -He would like to be on antibiotic as he has been hospitalized in the past for sepsis and he is concerned this will repeat -His urine is basically clear with no concern of infection at this time, but since he is diabetic and anxious I will start him on Bactrim empirically  while urine is sent for culture  Return in about 2 weeks (around 05/16/2023) for KUB .  These notes generated with voice recognition software. I apologize for typographical errors.  Cloretta Ned  [facility] [address]  [address] [place], [place] [address] [phone]

## 2023-05-06 LAB — CULTURE, URINE COMPREHENSIVE

## 2023-05-21 NOTE — Progress Notes (Deleted)
05/23/2023 9:11 AM   [name] Walker 04-16-1956 [id number]  Referring provider: No referring provider defined for this encounter.  Urological history: 1.  Nephrolithiasis -right URS (2016) and (2019)  -ESWL's x several   2. BPH with LU TS -PSA (12/2022) 0.5  -tamsulosin 0.8 mg daily   No chief complaint on file.  HPI: [name] Walker is a 67 y.o. male who presents today for 2-week follow-up.  Previous records reviewed.   At his visit on 05/02/2023, he has been having right sided flank pain that radiates into the right waist for the past 2 days.  Last night the pain was so intense he had a taking Percocet.  Today, the pain is just an uncomfortableness.   Patient denies any modifying or aggravating factors.  Patient denies any recent UTI's, gross hematuria, dysuria or suprapubic/flank pain.  Patient denies any fevers, chills, nausea or vomiting.  UA Clear.  KUB ? 2 mm right lower ureteral stone.  We discussed how he wanted to go forward regarding his flank pain and he decided to return in 2 weeks for a KUB.  He also was concerned because he has a history of sepsis and because he is diabetic and I placed him on antibiotic empirically, but his urine culture was negative.  KUB ***    PMH: Past Medical History:  Diagnosis Date   Bronchitis    CHRONIC   Chronic HFrEF (heart failure with reduced ejection fraction) (HCC)    a. 03/2021 Echo: EF 30-35%, GrI DD.   Coronary artery disease    a. 05/2012 Ant STEMI/PCI: LAD 100p (thrombectomy and 3.5x20 Veriflex BMS); b. 12/2014 Cath: patent stent; c. 03/2015 Cath: LM nl, LAD 10 ISR, 24m, D1 50p, LCX 20 diff throughout, RCA nl-->Med rx.   Erectile dysfunction    History of kidney stones    HOH (hard of hearing)    Hypercholesterolemia    Hypertension    Ischemic cardiomyopathy    a. 11/2015 Echo: EF 30-35%, diff HK; b. Refused ICD; c. 03/2021 Echo: EF 30-35%, GrI DD, nl RV fxn, mild MR.   MI (myocardial infarction) (HCC) 06/19/2012    being considered for defibilator but patient now refuses this.   Nephrolithiasis    NSTEMI (non-ST elevated myocardial infarction) (HCC) 03/2015   Patent BMS - LAD stent.   Sleep apnea    Tobacco abuse    Varicose veins     Surgical History: Past Surgical History:  Procedure Laterality Date   CARDIAC CATHETERIZATION N/A 01/09/2015   Procedure: Left Heart Cath and Coronary Angiography;  Surgeon: Lyn Records, MD;  Location: [facility] INVASIVE CV LAB;  Service: Cardiovascular;  Laterality: N/A;   CARDIAC CATHETERIZATION N/A 03/13/2015   Procedure: Left Heart Cath and Coronary Angiography;  Surgeon: Lyn Records, MD;  Location: [facility] INVASIVE CV LAB;  Service: Cardiovascular;  Laterality: N/A;   CORONARY ANGIOPLASTY     STENT   CORONARY STENT PLACEMENT     CYSTOSCOPY W/ URETERAL STENT REMOVAL Right 05/13/2017   Procedure: CYSTOSCOPY WITH STENT REMOVAL;  Surgeon: Orson Ape, MD;  Location: [facility];  Service: Urology;  Laterality: Right;   CYSTOSCOPY WITH STENT PLACEMENT Right 03/07/2015   Procedure: CYSTOSCOPY WITH STENT PLACEMENT;  Surgeon: Orson Ape, MD;  Location: [facility];  Service: Urology;  Laterality: Right;   CYSTOSCOPY WITH STENT PLACEMENT Right 03/25/2017   Procedure: CYSTOSCOPY WITH STENT PLACEMENT;  Surgeon: Orson Ape, MD;  Location: [facility];  Service: Urology;  Laterality: Right;   CYSTOSCOPY WITH STENT PLACEMENT Right 07/12/2021   Procedure: CYSTOSCOPY WITH STENT PLACEMENT;  Surgeon: Orson Ape, MD;  Location: [facility];  Service: Urology;  Laterality: Right;   CYSTOSCOPY/RETROGRADE/URETEROSCOPY Right 02/24/2018   Procedure: CYSTOSCOPY/RETROGRADE/URETEROSCOPY;  Surgeon: Orson Ape, MD;  Location: [facility];  Service: Urology;  Laterality: Right;   CYSTOSCOPY/RETROGRADE/URETEROSCOPY/STONE EXTRACTION WITH BASKET Right 03/25/2017   Procedure: CYSTOSCOPY/RETROGRADE/URETEROSCOPY/STONE EXTRACTION WITH BASKET;  Surgeon: Orson Ape, MD;  Location: [facility];   Service: Urology;  Laterality: Right;   EXTRACORPOREAL SHOCK WAVE LITHOTRIPSY Right 03/09/2015   Procedure: EXTRACORPOREAL SHOCK WAVE LITHOTRIPSY (ESWL);  Surgeon: Orson Ape, MD;  Location: [facility];  Service: Urology;  Laterality: Right;   EXTRACORPOREAL SHOCK WAVE LITHOTRIPSY Right 05/11/2015   Procedure: EXTRACORPOREAL SHOCK WAVE LITHOTRIPSY (ESWL);  Surgeon: Orson Ape, MD;  Location: [facility];  Service: Urology;  Laterality: Right;   EXTRACORPOREAL SHOCK WAVE LITHOTRIPSY Right 04/10/2017   Procedure: EXTRACORPOREAL SHOCK WAVE LITHOTRIPSY (ESWL);  Surgeon: Orson Ape, MD;  Location: [facility];  Service: Urology;  Laterality: Right;   EXTRACORPOREAL SHOCK WAVE LITHOTRIPSY Right 07/26/2021   Procedure: EXTRACORPOREAL SHOCK WAVE LITHOTRIPSY (ESWL);  Surgeon: Orson Ape, MD;  Location: [facility];  Service: Urology;  Laterality: Right;   LEFT HEART CATHETERIZATION WITH CORONARY ANGIOGRAM Right 06/19/2012   Procedure: LEFT HEART CATHETERIZATION WITH CORONARY ANGIOGRAM;  Surgeon: Lesleigh Noe, MD;  Location: [facility] CATH LAB;  Service: Cardiovascular;  Laterality: Right;   PERCUTANEOUS CORONARY STENT INTERVENTION (PCI-S) Right 06/19/2012   Procedure: PERCUTANEOUS CORONARY STENT INTERVENTION (PCI-S);  Surgeon: Lesleigh Noe, MD;  Location: [facility] CATH LAB;  Service: Cardiovascular;  Laterality: Right;   URETEROSCOPY WITH HOLMIUM LASER LITHOTRIPSY Right 02/24/2018   Procedure: URETEROSCOPY;  Surgeon: Orson Ape, MD;  Location: [facility];  Service: Urology;  Laterality: Right;    Home Medications:  Allergies as of 05/23/2023       Reactions   Nucynta [tapentadol] Nausea Only, Other (See Comments)   Dizziness, woozy.  Sick to his stomach   Spironolactone    Diarrhea/headache   Codeine Other (See Comments)   Too drowsy        Medication List        Accurate as of May 21, 2023  9:11 AM. If you have any questions, ask your nurse or doctor.          albuterol  108 (90 Base) MCG/ACT inhaler Commonly known as: VENTOLIN HFA Inhale 2 puffs into the lungs every 6 (six) hours as needed for wheezing or shortness of breath.   aspirin EC 81 MG tablet Take 1 tablet (81 mg total) by mouth daily. Swallow whole.   atorvastatin 40 MG tablet Commonly known as: LIPITOR Take 1 tablet (40 mg total) by mouth daily.   carvedilol 6.25 MG tablet Commonly known as: COREG Take 1 tablet (6.25 mg total) by mouth 2 (two) times daily.   dapagliflozin propanediol 10 MG Tabs tablet Commonly known as: Farxiga Take 1 tablet (10 mg total) by mouth daily before breakfast. What changed: how much to take   Entresto 24-26 MG Generic drug: sacubitril-valsartan Take 1 tablet by mouth 2 (two) times daily.   ondansetron 4 MG tablet Commonly known as: Zofran Take 1 tablet (4 mg total) by mouth every 8 (eight) hours as needed for nausea or vomiting.   oxyCODONE-acetaminophen 5-325 MG tablet Commonly known as: Percocet Take 1 tablet by mouth every 8 (eight) hours as needed for  severe pain (pain score 7-10).   sulfamethoxazole-trimethoprim 800-160 MG tablet Commonly known as: BACTRIM DS Take 1 tablet by mouth every 12 (twelve) hours.   tamsulosin 0.4 MG Caps capsule Commonly known as: Flomax Take 1 capsule (0.4 mg total) by mouth 2 (two) times daily.        Allergies:  Allergies  Allergen Reactions   Nucynta [Tapentadol] Nausea Only and Other (See Comments)    Dizziness, woozy.  Sick to his stomach   Spironolactone     Diarrhea/headache   Codeine Other (See Comments)    Too drowsy    Family History: Family History  Problem Relation Age of Onset   Cancer Father    Heart murmur Mother    Heart attack Brother     Social History:  reports that he has been smoking cigarettes. He has a 20 pack-year smoking history. He has never used smokeless tobacco. He reports current alcohol use. He reports that he does not use drugs.  ROS: Pertinent ROS in  HPI  Physical Exam: There were no vitals taken for this visit.  Constitutional:  Well nourished. Alert and oriented, No acute distress. HEENT: [place] AT, moist mucus membranes.  Trachea midline, no masses. Cardiovascular: No clubbing, cyanosis, or edema. Respiratory: Normal respiratory effort, no increased work of breathing. GI: Abdomen is soft, non tender, non distended, no abdominal masses. Liver and spleen not palpable.  No hernias appreciated.  Stool sample for occult testing is not indicated.   GU: No CVA tenderness.  No bladder fullness or masses.  Patient with circumcised/uncircumcised phallus. ***Foreskin easily retracted***  Urethral meatus is patent.  No penile discharge. No penile lesions or rashes. Scrotum without lesions, cysts, rashes and/or edema.  Testicles are located scrotally bilaterally. No masses are appreciated in the testicles. Left and right epididymis are normal. Rectal: Patient with  normal sphincter tone. Anus and perineum without scarring or rashes. No rectal masses are appreciated. Prostate is approximately *** grams, *** nodules are appreciated. Seminal vesicles are normal. Skin: No rashes, bruises or suspicious lesions. Lymph: No cervical or inguinal adenopathy. Neurologic: Grossly intact, no focal deficits, moving all 4 extremities. Psychiatric: Normal mood and affect.   Laboratory Data: N/A  Pertinent Imaging: KUB possible 2 mm ureteral stone,  radiologist report pending *** I have independently reviewed the films.    Assessment & Plan:    1. Flank pain -UA clear -urine sent for culture -KUB possible 2 mm right ureteral stone -Discussed that it is potentially not a visible stone and we can get further clarification with CT or he can return in 2 weeks and we can pursue this with medical expulsive therapy in the interim -He would like to return in 2 weeks for follow-up KUB and symptom recheck  2.  Nephrolithiasis -If he captures a fragment we will send it  in for analysis -After this acute episode, we will go ahead and pursue a 24-hour metabolic workup  3. History of sepsis -He would like to be on antibiotic as he has been hospitalized in the past for sepsis and he is concerned this will repeat -His urine is basically clear with no concern of infection at this time, but since he is diabetic and anxious I will start him on Bactrim empirically while urine is sent for culture  No follow-ups on file.  These notes generated with voice recognition software. I apologize for typographical errors.  Cloretta Ned  [facility] [address]  [address]  [place], [place] [address] [phone]

## 2023-05-23 ENCOUNTER — Ambulatory Visit: Payer: Medicare Other | Admitting: Urology

## 2023-05-23 DIAGNOSIS — N200 Calculus of kidney: Secondary | ICD-10-CM

## 2023-05-23 DIAGNOSIS — R109 Unspecified abdominal pain: Secondary | ICD-10-CM

## 2023-06-18 ENCOUNTER — Other Ambulatory Visit: Payer: Self-pay

## 2023-06-18 MED ORDER — ATORVASTATIN CALCIUM 40 MG PO TABS: 40.0000 mg | ORAL_TABLET | Freq: Every day | ORAL | 0 refills | Status: DC

## 2023-06-24 ENCOUNTER — Other Ambulatory Visit: Payer: Self-pay

## 2023-06-24 MED ORDER — CARVEDILOL 6.25 MG PO TABS: 6.2500 mg | ORAL_TABLET | Freq: Two times a day (BID) | ORAL | 0 refills | Status: DC

## 2023-07-11 ENCOUNTER — Ambulatory Visit: Payer: Medicare Other | Admitting: Cardiovascular Disease

## 2023-07-18 ENCOUNTER — Other Ambulatory Visit: Payer: Self-pay | Admitting: Nurse Practitioner

## 2023-07-24 ENCOUNTER — Other Ambulatory Visit: Payer: Self-pay | Admitting: Nurse Practitioner

## 2023-08-08 ENCOUNTER — Ambulatory Visit (INDEPENDENT_AMBULATORY_CARE_PROVIDER_SITE_OTHER): Payer: Medicare Other | Admitting: Cardiology

## 2023-08-08 ENCOUNTER — Encounter: Payer: Self-pay | Admitting: Cardiology

## 2023-08-08 VITALS — BP 116/56 | HR 79 | Ht 72.0 in | Wt 208.0 lb

## 2023-08-08 DIAGNOSIS — Z0130 Encounter for examination of blood pressure without abnormal findings: Secondary | ICD-10-CM | POA: Diagnosis not present

## 2023-08-08 DIAGNOSIS — Z720 Tobacco use: Secondary | ICD-10-CM | POA: Diagnosis not present

## 2023-08-08 DIAGNOSIS — F17210 Nicotine dependence, cigarettes, uncomplicated: Secondary | ICD-10-CM | POA: Diagnosis not present

## 2023-08-08 MED ORDER — VARENICLINE TARTRATE 1 MG PO TABS: 1.0000 mg | ORAL_TABLET | Freq: Two times a day (BID) | ORAL | 2 refills | Status: DC

## 2023-08-08 NOTE — Progress Notes (Signed)
 Established Patient Office Visit  Subjective:  Patient ID: [name] Walker, male    DOB: 19-Jun-1956  Age: 68 y.o. MRN: [id number]  Chief Complaint  Patient presents with   Follow-up    Quit Smoking    Patient in office wanting to discuss smoking cessation. Patient has been taking Chantix , using nicotine patches. Has not smoked in 6 weeks. Patient needing a refill on Chantix . States he has been having trouble sleeping. Will try to take Chantix  only once daily in the morning and if tolerated, quit Chantix  in 1-2 months.  Patient doing well otherwise. No complaints.     No other concerns at this time.   Past Medical History:  Diagnosis Date   Bronchitis    CHRONIC   Chronic HFrEF (heart failure with reduced ejection fraction) (HCC)    a. 03/2021 Echo: EF 30-35%, GrI DD.   Coronary artery disease    a. 05/2012 Ant STEMI/PCI: LAD 100p (thrombectomy and 3.5x20 Veriflex BMS); b. 12/2014 Cath: patent stent; c. 03/2015 Cath: LM nl, LAD 10 ISR, 35m, D1 50p, LCX 20 diff throughout, RCA nl-->Med rx.   Erectile dysfunction    History of kidney stones    HOH (hard of hearing)    Hypercholesterolemia    Hypertension    Ischemic cardiomyopathy    a. 11/2015 Echo: EF 30-35%, diff HK; b. Refused ICD; c. 03/2021 Echo: EF 30-35%, GrI DD, nl RV fxn, mild MR.   MI (myocardial infarction) (HCC) 06/19/2012   being considered for defibilator but patient now refuses this.   Nephrolithiasis    NSTEMI (non-ST elevated myocardial infarction) (HCC) 03/2015   Patent BMS - LAD stent.   Sleep apnea    Tobacco abuse    Varicose veins     Past Surgical History:  Procedure Laterality Date   CARDIAC CATHETERIZATION N/A 01/09/2015   Procedure: Left Heart Cath and Coronary Angiography;  Surgeon: Victory LELON Sharps, MD;  Location: [facility] INVASIVE CV LAB;  Service: Cardiovascular;  Laterality: N/A;   CARDIAC CATHETERIZATION N/A 03/13/2015   Procedure: Left Heart Cath and Coronary Angiography;  Surgeon: Victory LELON Sharps, MD;   Location: [facility] INVASIVE CV LAB;  Service: Cardiovascular;  Laterality: N/A;   CORONARY ANGIOPLASTY     STENT   CORONARY STENT PLACEMENT     CYSTOSCOPY W/ URETERAL STENT REMOVAL Right 05/13/2017   Procedure: CYSTOSCOPY WITH STENT REMOVAL;  Surgeon: Kassie Ozell SAUNDERS, MD;  Location: [facility] ORS;  Service: Urology;  Laterality: Right;   CYSTOSCOPY WITH STENT PLACEMENT Right 03/07/2015   Procedure: CYSTOSCOPY WITH STENT PLACEMENT;  Surgeon: Ozell SAUNDERS Kassie, MD;  Location: [facility] ORS;  Service: Urology;  Laterality: Right;   CYSTOSCOPY WITH STENT PLACEMENT Right 03/25/2017   Procedure: CYSTOSCOPY WITH STENT PLACEMENT;  Surgeon: Kassie Ozell SAUNDERS, MD;  Location: [facility] ORS;  Service: Urology;  Laterality: Right;   CYSTOSCOPY WITH STENT PLACEMENT Right 07/12/2021   Procedure: CYSTOSCOPY WITH STENT PLACEMENT;  Surgeon: Kassie Ozell SAUNDERS, MD;  Location: [facility] ORS;  Service: Urology;  Laterality: Right;   CYSTOSCOPY/RETROGRADE/URETEROSCOPY Right 02/24/2018   Procedure: CYSTOSCOPY/RETROGRADE/URETEROSCOPY;  Surgeon: Kassie Ozell SAUNDERS, MD;  Location: [facility] ORS;  Service: Urology;  Laterality: Right;   CYSTOSCOPY/RETROGRADE/URETEROSCOPY/STONE EXTRACTION WITH BASKET Right 03/25/2017   Procedure: CYSTOSCOPY/RETROGRADE/URETEROSCOPY/STONE EXTRACTION WITH BASKET;  Surgeon: Kassie Ozell SAUNDERS, MD;  Location: [facility]S;  Service: Urology;  Laterality: Right;   EXTRACORPOREAL SHOCK WAVE LITHOTRIPSY Right 03/09/2015   Procedure: EXTRACORPOREAL SHOCK WAVE LITHOTRIPSY (ESWL);  Surgeon: Ozell SAUNDERS Kassie, MD;  Location: [facility]  ORS;  Service: Urology;  Laterality: Right;   EXTRACORPOREAL SHOCK WAVE LITHOTRIPSY Right 05/11/2015   Procedure: EXTRACORPOREAL SHOCK WAVE LITHOTRIPSY (ESWL);  Surgeon: Ozell JONELLE Burkes, MD;  Location: [facility] ORS;  Service: Urology;  Laterality: Right;   EXTRACORPOREAL SHOCK WAVE LITHOTRIPSY Right 04/10/2017   Procedure: EXTRACORPOREAL SHOCK WAVE LITHOTRIPSY (ESWL);  Surgeon: Burkes Ozell JONELLE, MD;  Location: [facility] ORS;  Service:  Urology;  Laterality: Right;   EXTRACORPOREAL SHOCK WAVE LITHOTRIPSY Right 07/26/2021   Procedure: EXTRACORPOREAL SHOCK WAVE LITHOTRIPSY (ESWL);  Surgeon: Burkes Ozell JONELLE, MD;  Location: [facility] ORS;  Service: Urology;  Laterality: Right;   LEFT HEART CATHETERIZATION WITH CORONARY ANGIOGRAM Right 06/19/2012   Procedure: LEFT HEART CATHETERIZATION WITH CORONARY ANGIOGRAM;  Surgeon: Victory LELON Claudene DOUGLAS, MD;  Location: [facility] CATH LAB;  Service: Cardiovascular;  Laterality: Right;   PERCUTANEOUS CORONARY STENT INTERVENTION (PCI-S) Right 06/19/2012   Procedure: PERCUTANEOUS CORONARY STENT INTERVENTION (PCI-S);  Surgeon: Victory LELON Claudene DOUGLAS, MD;  Location: [facility] CATH LAB;  Service: Cardiovascular;  Laterality: Right;   URETEROSCOPY WITH HOLMIUM LASER LITHOTRIPSY Right 02/24/2018   Procedure: URETEROSCOPY;  Surgeon: Burkes Ozell JONELLE, MD;  Location: [facility] ORS;  Service: Urology;  Laterality: Right;    Social History   Socioeconomic History   Marital status: Single    Spouse name: Not on file   Number of children: Not on file   Years of education: Not on file   Highest education level: Not on file  Occupational History   Not on file  Tobacco Use   Smoking status: Every Day    Current packs/day: 0.50    Average packs/day: 0.5 packs/day for 40.0 years (20.0 ttl pk-yrs)    Types: Cigarettes   Smokeless tobacco: Never  Vaping Use   Vaping status: Never Used  Substance and Sexual Activity   Alcohol use: Yes    Alcohol/week: 0.0 standard drinks of alcohol    Comment: occ   Drug use: No   Sexual activity: Not on file  Other Topics Concern   Not on file  Social History Narrative   Not on file   Social Drivers of Health   Financial Resource Strain: Not on file  Food Insecurity: Not on file  Transportation Needs: Not on file  Physical Activity: Not on file  Stress: Not on file  Social Connections: Not on file  Intimate Partner Violence: Not on file    Family History  Problem Relation Age of Onset    Cancer Father    Heart murmur Mother    Heart attack Brother     Allergies  Allergen Reactions   Nucynta  [Tapentadol ] Nausea Only and Other (See Comments)    Dizziness, woozy.  Sick to his stomach   Spironolactone      Diarrhea/headache   Codeine Other (See Comments)    Too drowsy    Outpatient Medications Prior to Visit  Medication Sig   albuterol  (VENTOLIN  HFA) 108 (90 Base) MCG/ACT inhaler Inhale 2 puffs into the lungs every 6 (six) hours as needed for wheezing or shortness of breath.   aspirin  EC 81 MG EC tablet Take 1 tablet (81 mg total) by mouth daily. Swallow whole.   atorvastatin  (LIPITOR) 40 MG tablet TAKE 1 TABLET BY MOUTH EVERY DAY   carvedilol  (COREG ) 6.25 MG tablet Take 1 tablet (6.25 mg total) by mouth 2 (two) times daily. Please call office to schedule an appt for further refills. Thank you   dapagliflozin  propanediol (FARXIGA ) 10 MG TABS tablet Take 1 tablet (  10 mg total) by mouth daily before breakfast. (Patient taking differently: Take 5 mg by mouth daily before breakfast.)   ondansetron  (ZOFRAN ) 4 MG tablet Take 1 tablet (4 mg total) by mouth every 8 (eight) hours as needed for nausea or vomiting.   oxyCODONE -acetaminophen  (PERCOCET) 5-325 MG tablet Take 1 tablet by mouth every 8 (eight) hours as needed for severe pain (pain score 7-10).   sacubitril -valsartan  (ENTRESTO ) 24-26 MG Take 1 tablet by mouth 2 (two) times daily.   sulfamethoxazole -trimethoprim  (BACTRIM  DS) 800-160 MG tablet Take 1 tablet by mouth every 12 (twelve) hours.   tamsulosin  (FLOMAX ) 0.4 MG CAPS capsule Take 1 capsule (0.4 mg total) by mouth 2 (two) times daily.   No facility-administered medications prior to visit.    Review of Systems  Constitutional: Negative.   HENT: Negative.    Eyes: Negative.   Respiratory: Negative.  Negative for shortness of breath.   Cardiovascular: Negative.  Negative for chest pain.  Gastrointestinal: Negative.  Negative for abdominal pain, constipation and  diarrhea.  Genitourinary: Negative.   Musculoskeletal:  Negative for joint pain and myalgias.  Skin: Negative.   Neurological: Negative.  Negative for dizziness and headaches.  Endo/Heme/Allergies: Negative.   All other systems reviewed and are negative.      Objective:   BP (!) 116/56   Pulse 79   Ht 6' (1.829 m)   Wt 208 lb (94.3 kg)   SpO2 96%   BMI 28.21 kg/m   Vitals:   08/08/23 1311  BP: (!) 116/56  Pulse: 79  Height: 6' (1.829 m)  Weight: 208 lb (94.3 kg)  SpO2: 96%  BMI (Calculated): 28.2    Physical Exam Nursing note reviewed.  Constitutional:      Appearance: Normal appearance. He is normal weight.  HENT:     Head: Normocephalic and atraumatic.     Nose: Nose normal.     Mouth/Throat:     Mouth: Mucous membranes are moist.     Pharynx: Oropharynx is clear.  Eyes:     Extraocular Movements: Extraocular movements intact.     Conjunctiva/sclera: Conjunctivae normal.     Pupils: Pupils are equal, round, and reactive to light.  Cardiovascular:     Rate and Rhythm: Normal rate and regular rhythm.     Pulses: Normal pulses.     Heart sounds: Normal heart sounds.  Pulmonary:     Effort: Pulmonary effort is normal.     Breath sounds: Normal breath sounds.  Abdominal:     General: Abdomen is flat. Bowel sounds are normal.     Palpations: Abdomen is soft.  Musculoskeletal:        General: Normal range of motion.     Cervical back: Normal range of motion.  Skin:    General: Skin is warm and dry.  Neurological:     General: No focal deficit present.     Mental Status: He is alert and oriented to person, place, and time.  Psychiatric:        Mood and Affect: Mood normal.        Behavior: Behavior normal.        Thought Content: Thought content normal.        Judgment: Judgment normal.      No results found for any visits on 08/08/23.  No results found for this or any previous visit (from the past 2160 hours).    Assessment & Plan:  Chantix   refilled.   Problem List Items Addressed  This Visit       Other   Tobacco abuse - Primary    Return in about 5 months (around 01/05/2024) for with fasting labs prior.   Total time spent: 25 minutes  [employer], NP  08/08/2023   This document may have been prepared by Dragon Voice Recognition software and as such may include unintentional dictation errors.

## 2023-09-11 ENCOUNTER — Other Ambulatory Visit: Payer: Self-pay | Admitting: Nurse Practitioner

## 2023-09-27 ENCOUNTER — Other Ambulatory Visit: Payer: Self-pay | Admitting: Nurse Practitioner

## 2023-10-12 ENCOUNTER — Other Ambulatory Visit: Payer: Self-pay | Admitting: Cardiology

## 2023-10-14 ENCOUNTER — Other Ambulatory Visit: Payer: Self-pay | Admitting: Nurse Practitioner

## 2023-10-16 ENCOUNTER — Ambulatory Visit: Admitting: Cardiovascular Disease

## 2023-10-16 ENCOUNTER — Encounter: Payer: Self-pay | Admitting: Cardiovascular Disease

## 2023-10-16 VITALS — BP 106/62 | HR 86 | Ht 72.0 in | Wt 211.0 lb

## 2023-10-16 DIAGNOSIS — I5020 Unspecified systolic (congestive) heart failure: Secondary | ICD-10-CM | POA: Diagnosis not present

## 2023-10-16 DIAGNOSIS — I2510 Atherosclerotic heart disease of native coronary artery without angina pectoris: Secondary | ICD-10-CM | POA: Diagnosis not present

## 2023-10-16 DIAGNOSIS — I252 Old myocardial infarction: Secondary | ICD-10-CM

## 2023-10-16 DIAGNOSIS — I480 Paroxysmal atrial fibrillation: Secondary | ICD-10-CM

## 2023-10-16 DIAGNOSIS — E785 Hyperlipidemia, unspecified: Secondary | ICD-10-CM | POA: Diagnosis not present

## 2023-10-16 MED ORDER — ATORVASTATIN CALCIUM 40 MG PO TABS: 40.0000 mg | ORAL_TABLET | Freq: Every day | ORAL | 0 refills | Status: DC

## 2023-10-16 MED ORDER — DAPAGLIFLOZIN PROPANEDIOL 10 MG PO TABS: 10.0000 mg | ORAL_TABLET | Freq: Every day | ORAL | 2 refills | Status: DC

## 2023-10-16 NOTE — Progress Notes (Signed)
 Cardiology Office Note   Date:  10/16/2023   ID:  [name] Walker, DOB December 19, 1955, MRN [id number]  PCP:  System, Provider Not In  Cardiologist:  Adrian Blackwater, MD      History of Present Illness: [name] Walker is a 68 y.o. male who presents for  Chief Complaint  Patient presents with   Medication Refill    Medication Refills    Doing well, quit smoking as of  06/20/23  Medication Refill      Past Medical History:  Diagnosis Date   Bronchitis    CHRONIC   Chronic HFrEF (heart failure with reduced ejection fraction) (HCC)    a. 03/2021 Echo: EF 30-35%, GrI DD.   Coronary artery disease    a. 05/2012 Ant STEMI/PCI: LAD 100p (thrombectomy and 3.5x20 Veriflex BMS); b. 12/2014 Cath: patent stent; c. 03/2015 Cath: LM nl, LAD 10 ISR, 32m, D1 50p, LCX 20 diff throughout, RCA nl-->Med rx.   Erectile dysfunction    History of kidney stones    HOH (hard of hearing)    Hypercholesterolemia    Hypertension    Ischemic cardiomyopathy    a. 11/2015 Echo: EF 30-35%, diff HK; b. Refused ICD; c. 03/2021 Echo: EF 30-35%, GrI DD, nl RV fxn, mild MR.   MI (myocardial infarction) (HCC) 06/19/2012   being considered for defibilator but patient now refuses this.   Nephrolithiasis    NSTEMI (non-ST elevated myocardial infarction) (HCC) 03/2015   Patent BMS - LAD stent.   Sleep apnea    Tobacco abuse    Varicose veins      Past Surgical History:  Procedure Laterality Date   CARDIAC CATHETERIZATION N/A 01/09/2015   Procedure: Left Heart Cath and Coronary Angiography;  Surgeon: Lyn Records, MD;  Location: [facility] INVASIVE CV LAB;  Service: Cardiovascular;  Laterality: N/A;   CARDIAC CATHETERIZATION N/A 03/13/2015   Procedure: Left Heart Cath and Coronary Angiography;  Surgeon: Lyn Records, MD;  Location: [facility] INVASIVE CV LAB;  Service: Cardiovascular;  Laterality: N/A;   CORONARY ANGIOPLASTY     STENT   CORONARY STENT PLACEMENT     CYSTOSCOPY W/ URETERAL STENT REMOVAL Right 05/13/2017    Procedure: CYSTOSCOPY WITH STENT REMOVAL;  Surgeon: Orson Ape, MD;  Location: [facility] ORS;  Service: Urology;  Laterality: Right;   CYSTOSCOPY WITH STENT PLACEMENT Right 03/07/2015   Procedure: CYSTOSCOPY WITH STENT PLACEMENT;  Surgeon: Orson Ape, MD;  Location: [facility] ORS;  Service: Urology;  Laterality: Right;   CYSTOSCOPY WITH STENT PLACEMENT Right 03/25/2017   Procedure: CYSTOSCOPY WITH STENT PLACEMENT;  Surgeon: Orson Ape, MD;  Location: [facility];  Service: Urology;  Laterality: Right;   CYSTOSCOPY WITH STENT PLACEMENT Right 07/12/2021   Procedure: CYSTOSCOPY WITH STENT PLACEMENT;  Surgeon: Orson Ape, MD;  Location: [facility];  Service: Urology;  Laterality: Right;   CYSTOSCOPY/RETROGRADE/URETEROSCOPY Right 02/24/2018   Procedure: CYSTOSCOPY/RETROGRADE/URETEROSCOPY;  Surgeon: Orson Ape, MD;  Location: [facility] ORS;  Service: Urology;  Laterality: Right;   CYSTOSCOPY/RETROGRADE/URETEROSCOPY/STONE EXTRACTION WITH BASKET Right 03/25/2017   Procedure: CYSTOSCOPY/RETROGRADE/URETEROSCOPY/STONE EXTRACTION WITH BASKET;  Surgeon: Orson Ape, MD;  Location: [facility] ORS;  Service: Urology;  Laterality: Right;   EXTRACORPOREAL SHOCK WAVE LITHOTRIPSY Right 03/09/2015   Procedure: EXTRACORPOREAL SHOCK WAVE LITHOTRIPSY (ESWL);  Surgeon: Orson Ape, MD;  Location: [facility] ORS;  Service: Urology;  Laterality: Right;   EXTRACORPOREAL SHOCK WAVE LITHOTRIPSY Right 05/11/2015   Procedure: EXTRACORPOREAL SHOCK WAVE LITHOTRIPSY (ESWL);  Surgeon: Casimiro Needle  Gilles Chiquito, MD;  Location: [facility] ORS;  Service: Urology;  Laterality: Right;   EXTRACORPOREAL SHOCK WAVE LITHOTRIPSY Right 04/10/2017   Procedure: EXTRACORPOREAL SHOCK WAVE LITHOTRIPSY (ESWL);  Surgeon: Orson Ape, MD;  Location: [facility]S;  Service: Urology;  Laterality: Right;   EXTRACORPOREAL SHOCK WAVE LITHOTRIPSY Right 07/26/2021   Procedure: EXTRACORPOREAL SHOCK WAVE LITHOTRIPSY (ESWL);  Surgeon: Orson Ape, MD;  Location: [facility]S;   Service: Urology;  Laterality: Right;   LEFT HEART CATHETERIZATION WITH CORONARY ANGIOGRAM Right 06/19/2012   Procedure: LEFT HEART CATHETERIZATION WITH CORONARY ANGIOGRAM;  Surgeon: Lesleigh Noe, MD;  Location: [facility] CATH LAB;  Service: Cardiovascular;  Laterality: Right;   PERCUTANEOUS CORONARY STENT INTERVENTION (PCI-S) Right 06/19/2012   Procedure: PERCUTANEOUS CORONARY STENT INTERVENTION (PCI-S);  Surgeon: Lesleigh Noe, MD;  Location: [facility] CATH LAB;  Service: Cardiovascular;  Laterality: Right;   URETEROSCOPY WITH HOLMIUM LASER LITHOTRIPSY Right 02/24/2018   Procedure: URETEROSCOPY;  Surgeon: Orson Ape, MD;  Location: [facility] ORS;  Service: Urology;  Laterality: Right;     Current Outpatient Medications  Medication Sig Dispense Refill   carvedilol (COREG) 6.25 MG tablet Take 1 tablet (6.25 mg total) by mouth 2 (two) times daily. Please call office to schedule an appt for further refills. Thank you 30 tablet 0   sacubitril-valsartan (ENTRESTO) 24-26 MG Take 1 tablet by mouth 2 (two) times daily. 180 tablet 3   albuterol (VENTOLIN HFA) 108 (90 Base) MCG/ACT inhaler Inhale 2 puffs into the lungs every 6 (six) hours as needed for wheezing or shortness of breath.     aspirin EC 81 MG EC tablet Take 1 tablet (81 mg total) by mouth daily. Swallow whole. 30 tablet 11   atorvastatin (LIPITOR) 40 MG tablet Take 1 tablet (40 mg total) by mouth daily. 15 tablet 0   dapagliflozin propanediol (FARXIGA) 10 MG TABS tablet Take 1 tablet (10 mg total) by mouth daily before breakfast. 90 tablet 2   ondansetron (ZOFRAN) 4 MG tablet Take 1 tablet (4 mg total) by mouth every 8 (eight) hours as needed for nausea or vomiting. 20 tablet 0   oxyCODONE-acetaminophen (PERCOCET) 5-325 MG tablet Take 1 tablet by mouth every 8 (eight) hours as needed for severe pain (pain score 7-10). 10 tablet 0   tamsulosin (FLOMAX) 0.4 MG CAPS capsule Take 1 capsule (0.4 mg total) by mouth 2 (two) times daily. 180 capsule 3    varenicline (CHANTIX) 1 MG tablet TAKE 1 TABLET BY MOUTH TWICE A DAY 180 tablet 0   No current facility-administered medications for this visit.    Allergies:   Nucynta [tapentadol], Spironolactone, and Codeine    Social History:   reports that he has been smoking cigarettes. He has a 20 pack-year smoking history. He has never used smokeless tobacco. He reports current alcohol use. He reports that he does not use drugs.   Family History:  family history includes Cancer in his father; Heart attack in his brother; Heart murmur in his mother.    ROS:     Review of Systems  Constitutional: Negative.   HENT: Negative.    Eyes: Negative.   Respiratory: Negative.    Gastrointestinal: Negative.   Genitourinary: Negative.   Musculoskeletal: Negative.   Skin: Negative.   Neurological: Negative.   Endo/Heme/Allergies: Negative.   Psychiatric/Behavioral: Negative.    All other systems reviewed and are negative.     All other systems are reviewed and negative.    PHYSICAL EXAM: VS:  BP 106/62   Pulse 86   Ht 6' (1.829 m)   Wt 211 lb (95.7 kg)   SpO2 94%   BMI 28.62 kg/m  , BMI Body mass index is 28.62 kg/m. Last weight:  Wt Readings from Last 3 Encounters:  10/16/23 211 lb (95.7 kg)  08/08/23 208 lb (94.3 kg)  04/03/23 205 lb 3.2 oz (93.1 kg)     Physical Exam Vitals reviewed.  Constitutional:      Appearance: Normal appearance. He is normal weight.  HENT:     Head: Normocephalic.     Nose: Nose normal.     Mouth/Throat:     Mouth: Mucous membranes are moist.  Eyes:     Pupils: Pupils are equal, round, and reactive to light.  Cardiovascular:     Rate and Rhythm: Normal rate and regular rhythm.     Pulses: Normal pulses.     Heart sounds: Normal heart sounds.  Pulmonary:     Effort: Pulmonary effort is normal.  Abdominal:     General: Abdomen is flat. Bowel sounds are normal.  Musculoskeletal:        General: Normal range of motion.     Cervical back: Normal  range of motion.  Skin:    General: Skin is warm.  Neurological:     General: No focal deficit present.     Mental Status: He is alert.  Psychiatric:        Mood and Affect: Mood normal.       EKG:   Recent Labs: 01/29/2023: ALT 16; BUN 17; Creatinine, Ser 1.04; Hemoglobin 15.0; Platelets 154; Potassium 4.1; Sodium 148; TSH 0.964    Lipid Panel    Component Value Date/Time   CHOL 134 01/29/2023 1543   TRIG 127 01/29/2023 1543   HDL 48 01/29/2023 1543   CHOLHDL 3.0 05/09/2022 0958   CHOLHDL 3.5 03/12/2015 0420   VLDL 24 03/12/2015 0420   LDLCALC 64 01/29/2023 1543      Other studies Reviewed: Additional studies/ records that were reviewed today include:  Review of the above records demonstrates:       No data to display            ASSESSMENT AND PLAN:    ICD-10-CM   1. Paroxysmal atrial fibrillation (HCC)  I48.0 dapagliflozin propanediol (FARXIGA) 10 MG TABS tablet    PCV ECHOCARDIOGRAM COMPLETE    atorvastatin (LIPITOR) 40 MG tablet    2. HFrEF (heart failure with reduced ejection fraction) (HCC)  I50.20 dapagliflozin propanediol (FARXIGA) 10 MG TABS tablet    PCV ECHOCARDIOGRAM COMPLETE    atorvastatin (LIPITOR) 40 MG tablet   ON GDMT, but on farxiga 5 mg as felt dizzy when took 10 mg. NO SOB/Chest pains Get echo    3. Coronary artery disease involving native coronary artery of native heart without angina pectoris  I25.10 dapagliflozin propanediol (FARXIGA) 10 MG TABS tablet    PCV ECHOCARDIOGRAM COMPLETE    atorvastatin (LIPITOR) 40 MG tablet    4. Dyslipidemia  E78.5 dapagliflozin propanediol (FARXIGA) 10 MG TABS tablet    PCV ECHOCARDIOGRAM COMPLETE    atorvastatin (LIPITOR) 40 MG tablet    5. Atherosclerosis of native coronary artery of native heart without angina pectoris  I25.10 dapagliflozin propanediol (FARXIGA) 10 MG TABS tablet    PCV ECHOCARDIOGRAM COMPLETE    atorvastatin (LIPITOR) 40 MG tablet    6. Old MI (myocardial infarction)  I25.2  dapagliflozin propanediol (FARXIGA) 10 MG TABS tablet  PCV ECHOCARDIOGRAM COMPLETE    atorvastatin (LIPITOR) 40 MG tablet       Problem List Items Addressed This Visit       Cardiovascular and Mediastinum   Old MI (myocardial infarction)   Relevant Medications   dapagliflozin propanediol (FARXIGA) 10 MG TABS tablet   atorvastatin (LIPITOR) 40 MG tablet   Other Relevant Orders   PCV ECHOCARDIOGRAM COMPLETE   Coronary atherosclerosis of native coronary artery   Relevant Medications   dapagliflozin propanediol (FARXIGA) 10 MG TABS tablet   atorvastatin (LIPITOR) 40 MG tablet   Other Relevant Orders   PCV ECHOCARDIOGRAM COMPLETE   Atrial fibrillation (HCC) - Primary   Relevant Medications   dapagliflozin propanediol (FARXIGA) 10 MG TABS tablet   atorvastatin (LIPITOR) 40 MG tablet   Other Relevant Orders   PCV ECHOCARDIOGRAM COMPLETE   HFrEF (heart failure with reduced ejection fraction) (HCC)   Relevant Medications   dapagliflozin propanediol (FARXIGA) 10 MG TABS tablet   atorvastatin (LIPITOR) 40 MG tablet   Other Relevant Orders   PCV ECHOCARDIOGRAM COMPLETE     Other   Dyslipidemia   Relevant Medications   dapagliflozin propanediol (FARXIGA) 10 MG TABS tablet   atorvastatin (LIPITOR) 40 MG tablet   Other Relevant Orders   PCV ECHOCARDIOGRAM COMPLETE   Other Visit Diagnoses       Coronary artery disease involving native coronary artery of native heart without angina pectoris       Relevant Medications   dapagliflozin propanediol (FARXIGA) 10 MG TABS tablet   atorvastatin (LIPITOR) 40 MG tablet   Other Relevant Orders   PCV ECHOCARDIOGRAM COMPLETE          Disposition:   Return in about 2 months (around 12/16/2023) for echo, and f/u.    Total time spent: 30 minutes  Signed,  Debborah Fairly, MD  10/16/2023 3:33 PM    [facility]

## 2023-10-25 ENCOUNTER — Other Ambulatory Visit: Payer: Self-pay | Admitting: Cardiovascular Disease

## 2023-10-25 DIAGNOSIS — E785 Hyperlipidemia, unspecified: Secondary | ICD-10-CM

## 2023-10-25 DIAGNOSIS — I480 Paroxysmal atrial fibrillation: Secondary | ICD-10-CM

## 2023-10-25 DIAGNOSIS — I2510 Atherosclerotic heart disease of native coronary artery without angina pectoris: Secondary | ICD-10-CM

## 2023-10-25 DIAGNOSIS — I252 Old myocardial infarction: Secondary | ICD-10-CM

## 2023-10-25 DIAGNOSIS — I5020 Unspecified systolic (congestive) heart failure: Secondary | ICD-10-CM

## 2023-10-31 ENCOUNTER — Ambulatory Visit (INDEPENDENT_AMBULATORY_CARE_PROVIDER_SITE_OTHER)

## 2023-10-31 DIAGNOSIS — I5020 Unspecified systolic (congestive) heart failure: Secondary | ICD-10-CM

## 2023-10-31 DIAGNOSIS — I252 Old myocardial infarction: Secondary | ICD-10-CM

## 2023-10-31 DIAGNOSIS — I371 Nonrheumatic pulmonary valve insufficiency: Secondary | ICD-10-CM

## 2023-10-31 DIAGNOSIS — I361 Nonrheumatic tricuspid (valve) insufficiency: Secondary | ICD-10-CM | POA: Diagnosis not present

## 2023-10-31 DIAGNOSIS — I2510 Atherosclerotic heart disease of native coronary artery without angina pectoris: Secondary | ICD-10-CM

## 2023-10-31 DIAGNOSIS — I340 Nonrheumatic mitral (valve) insufficiency: Secondary | ICD-10-CM | POA: Diagnosis not present

## 2023-10-31 DIAGNOSIS — E785 Hyperlipidemia, unspecified: Secondary | ICD-10-CM

## 2023-10-31 DIAGNOSIS — I480 Paroxysmal atrial fibrillation: Secondary | ICD-10-CM

## 2023-11-07 ENCOUNTER — Emergency Department
Admission: EM | Admit: 2023-11-07 | Discharge: 2023-11-07 | Disposition: A | Discharge status: Home / Self Care | Attending: Emergency Medicine | Admitting: Emergency Medicine

## 2023-11-07 ENCOUNTER — Other Ambulatory Visit: Payer: Self-pay

## 2023-11-07 ENCOUNTER — Emergency Department

## 2023-11-07 DIAGNOSIS — I4891 Unspecified atrial fibrillation: Secondary | ICD-10-CM | POA: Diagnosis not present

## 2023-11-07 DIAGNOSIS — I5020 Unspecified systolic (congestive) heart failure: Secondary | ICD-10-CM | POA: Diagnosis not present

## 2023-11-07 DIAGNOSIS — I2510 Atherosclerotic heart disease of native coronary artery without angina pectoris: Secondary | ICD-10-CM | POA: Insufficient documentation

## 2023-11-07 DIAGNOSIS — R002 Palpitations: Secondary | ICD-10-CM | POA: Diagnosis present

## 2023-11-07 DIAGNOSIS — I110 Hypertensive heart disease with heart failure: Secondary | ICD-10-CM | POA: Insufficient documentation

## 2023-11-07 LAB — BASIC METABOLIC PANEL WITH GFR
Anion gap: 8 (ref 5–15)
BUN: 22 mg/dL (ref 8–23)
CO2: 22 mmol/L (ref 22–32)
Calcium: 9.1 mg/dL (ref 8.9–10.3)
Chloride: 104 mmol/L (ref 98–111)
Creatinine, Ser: 1.15 mg/dL (ref 0.61–1.24)
GFR, Estimated: >60 mL/min (ref >60)
Glucose, Bld: 129 mg/dL — ABNORMAL HIGH (ref 70–99)
Potassium: 3.5 mmol/L (ref 3.5–5.1)
Sodium: 134 mmol/L — ABNORMAL LOW (ref 135–145)

## 2023-11-07 LAB — CBC
HCT: 45.6 % (ref 39.0–52.0)
Hemoglobin: 16.1 g/dL (ref 13.0–17.0)
MCH: 34 pg (ref 26.0–34.0)
MCHC: 35.3 g/dL (ref 30.0–36.0)
MCV: 96.2 fL (ref 80.0–100.0)
Platelets: 148 10*3/uL — ABNORMAL LOW (ref 150–400)
RBC: 4.74 MIL/uL (ref 4.22–5.81)
RDW: 12.7 % (ref 11.5–15.5)
WBC: 7.5 10*3/uL (ref 4.0–10.5)
nRBC: 0 % (ref 0.0–0.2)

## 2023-11-07 LAB — TROPONIN I (HIGH SENSITIVITY)
Troponin I (High Sensitivity): 10 ng/L (ref <18)
Troponin I (High Sensitivity): 9 ng/L (ref <18)

## 2023-11-07 NOTE — Discharge Instructions (Signed)
 Your symptoms this morning likely were due to an episode of rapid A-fib.  You have now been in a normal rhythm since shortly after you arrived in the ER and your blood work is reassuring.  Follow-up with your primary care doctor and cardiologist.  Return to the ER immediately for new, worsening, or persistent severe palpitations, lightheadedness, chest or arm pain, difficulty breathing, or any other new or worsening symptoms that concern you.

## 2023-11-07 NOTE — ED Triage Notes (Signed)
 Pt to ED via POV from home. Pt ambulatory to triage. Pt reports palpitations, right arm pain, light headed that started this am. Hx of MI. Pt HR 140s in triage reading afib RVR

## 2023-11-07 NOTE — ED Provider Notes (Signed)
 [facility] Provider Note    Event Date/Time   First MD Initiated Contact with Patient 11/07/23 1050     (approximate)   History   Palpitations (/)   HPI  [name] Walker is a 68 y.o. male with a history of paroxysmal atrial fibrillation, hypertension, hypercholesterolemia, CAD, HFrEF, and sleep apnea who presents with palpitations, acute onset a few hours ago this morning, now improved, and associated with right arm pain as well as diaphoresis and lightheadedness.  The patient states he was worried he was could be having a heart attack.  He states he is compliant with his medications.  He took his Coreg  this morning and also took a baby aspirin .  He states that after the symptoms did not resolve for some time, he decided to come to the ED.  However he is now feeling better.  I reviewed past medical records.  The patient's most recent outpatient encounter was with Dr. Meredeth Stallion from cardiology on 4/17 for follow-up of his chronic conditions.   Physical Exam   Triage Vital Signs: ED Triage Vitals [11/07/23 1039]  Encounter Vitals Group     BP 105/73     Systolic BP Percentile      Diastolic BP Percentile      Pulse Rate (!) 144     Resp 20     Temp 97.8 F (36.6 C)     Temp Source Oral     SpO2 97 %     Weight      Height      Head Circumference      Peak Flow      Pain Score 5     Pain Loc      Pain Education      Exclude from Growth Chart     Most recent vital signs: Vitals:   11/07/23 1230 11/07/23 1300  BP: 93/71 102/71  Pulse: 68 62  Resp: 15 15  Temp:    SpO2: 95% 94%     General: Alert, well-appearing, no distress.  CV:  Good peripheral perfusion.  Resp:  Normal effort.  Lungs CTAB. Abd:  No distention.  Other:  No peripheral edema.   ED Results / Procedures / Treatments   Labs (all labs ordered are listed, but only abnormal results are displayed) Labs Reviewed  BASIC METABOLIC PANEL WITH GFR - Abnormal; Notable for the  following components:      Result Value   Sodium 134 (*)    Glucose, Bld 129 (*)    All other components within normal limits  CBC - Abnormal; Notable for the following components:   Platelets 148 (*)    All other components within normal limits  TROPONIN I (HIGH SENSITIVITY)  TROPONIN I (HIGH SENSITIVITY)     EKG  ED ECG REPORT I, Lind Repine, the attending physician, personally viewed and interpreted this ECG.  Date: 11/07/2023 EKG Time: 1040 Rate: 146 Rhythm: Atrial fibrillation with RVR QRS Axis: normal Intervals: normal ST/T Wave abnormalities: ST abnormalities Narrative Interpretation: Rapid atrial fibrillation with no evidence of acute ischemia   ED ECG REPORT I, Lind Repine, the attending physician, personally viewed and interpreted this ECG.  Date: 11/07/2023 EKG Time: 1049 Rate: 80 Rhythm: normal sinus rhythm QRS Axis: normal Intervals: normal ST/T Wave abnormalities: normal Narrative Interpretation: no evidence of acute ischemia   RADIOLOGY  Chest: I independently viewed and interpreted the images; there is bronchial thickening with no focal consolidation or edema  PROCEDURES:  Critical Care performed: No  Procedures   MEDICATIONS ORDERED IN ED: Medications - No data to display   IMPRESSION / MDM / ASSESSMENT AND PLAN / ED COURSE  I reviewed the triage vital signs and the nursing notes.  68 year old male with PMH as noted above presents with acute onset of palpitations, lightheadedness, diaphoresis, and right arm pain this morning which lasted for couple of hours and has now resolved.  On exam the patient is overall well-appearing.  His vital signs are currently normal although his heart rate was in the 140s in triage.  Physical exam is otherwise unremarkable for acute findings.  EKG in triage showed atrial fibrillation with RVR.  Repeat now shows sinus rhythm, and he continues to be in sinus on the monitor.  Differential  diagnosis includes, but is not limited to, atrial fibrillation with RVR, other tachydysrhythmia, ACS, dehydration/hypovolemia, other metabolic cause.  We will continue to have the patient on the cardiac monitor, obtain lab workup including cardiac enzymes, and reassess.  Patient's presentation is most consistent with acute presentation with potential threat to life or bodily function.  The patient is on the cardiac monitor to evaluate for evidence of arrhythmia and/or significant heart rate changes.   ----------------------------------------- 1:45 PM on 11/07/2023 -----------------------------------------  Chest x-ray shows no acute findings.  Troponins are negative x 2.  BMP and CBC are unremarkable.  The patient has remained asymptomatic and in sinus rhythm at a normal rate throughout his ED stay.  Overall I suspect that his initial symptoms were due to an episode of rapid atrial fibrillation although the precipitant for this is unclear.  However, the patient is now stable for discharge home.  I counseled him on the results of the workup.  I gave strict return precautions and he expressed understanding.   FINAL CLINICAL IMPRESSION(S) / ED DIAGNOSES   Final diagnoses:  Atrial fibrillation with rapid ventricular response (HCC)     Rx / DC Orders   ED Discharge Orders     None        Note:  This document was prepared using Dragon voice recognition software and may include unintentional dictation errors.    Lind Repine, MD 11/07/23 1348

## 2023-11-27 ENCOUNTER — Encounter: Payer: Self-pay | Admitting: Cardiovascular Disease

## 2023-11-27 ENCOUNTER — Ambulatory Visit: Admitting: Cardiovascular Disease

## 2023-11-27 VITALS — BP 102/58 | HR 75 | Ht 71.0 in | Wt 220.0 lb

## 2023-11-27 DIAGNOSIS — I480 Paroxysmal atrial fibrillation: Secondary | ICD-10-CM

## 2023-11-27 DIAGNOSIS — R0602 Shortness of breath: Secondary | ICD-10-CM | POA: Diagnosis not present

## 2023-11-27 DIAGNOSIS — I5020 Unspecified systolic (congestive) heart failure: Secondary | ICD-10-CM | POA: Diagnosis not present

## 2023-11-27 DIAGNOSIS — I2510 Atherosclerotic heart disease of native coronary artery without angina pectoris: Secondary | ICD-10-CM

## 2023-11-27 DIAGNOSIS — E785 Hyperlipidemia, unspecified: Secondary | ICD-10-CM | POA: Diagnosis not present

## 2023-11-27 DIAGNOSIS — Z0130 Encounter for examination of blood pressure without abnormal findings: Secondary | ICD-10-CM

## 2023-11-27 DIAGNOSIS — I252 Old myocardial infarction: Secondary | ICD-10-CM

## 2023-11-27 MED ORDER — AMIODARONE HCL 200 MG PO TABS: ORAL_TABLET | ORAL | 0 refills | Status: DC

## 2023-11-27 MED ORDER — APIXABAN 5 MG PO TABS
5.0000 mg | ORAL_TABLET | Freq: Two times a day (BID) | ORAL | 0 refills | Status: DC
Start: 2023-11-27 — End: 2023-12-25

## 2023-11-27 NOTE — Progress Notes (Signed)
 Cardiology Office Note   Date:  11/27/2023   ID:  [name] Walker, [name] Walker 21-Sep-1955, MRN [id number]  PCP:  [facility]  Cardiologist:  Debborah Fairly, MD      History of Present Illness: [name] Walker is a 68 y.o. male who presents for  Chief Complaint  Patient presents with   Follow-up    36mo echo results    Went to [facility] Tuesday and told had atrial fibrillation. Had palpitation and diaphoreisi.      Past Medical History:  Diagnosis Date   Bronchitis    CHRONIC   Chronic HFrEF (heart failure with reduced ejection fraction) (HCC)    a. 03/2021 Echo: EF 30-35%, GrI DD.   Coronary artery disease    a. 05/2012 Ant STEMI/PCI: LAD 100p (thrombectomy and 3.5x20 Veriflex BMS); b. 12/2014 Cath: patent stent; c. 03/2015 Cath: LM nl, LAD 10 ISR, 8m, D1 50p, LCX 20 diff throughout, RCA nl-->Med rx.   Erectile dysfunction    History of kidney stones    HOH (hard of hearing)    Hypercholesterolemia    Hypertension    Ischemic cardiomyopathy    a. 11/2015 Echo: EF 30-35%, diff HK; b. Refused ICD; c. 03/2021 Echo: EF 30-35%, GrI DD, nl RV fxn, mild MR.   MI (myocardial infarction) (HCC) 06/19/2012   being considered for defibilator but patient now refuses this.   Nephrolithiasis    NSTEMI (non-ST elevated myocardial infarction) (HCC) 03/2015   Patent BMS - LAD stent.   Sleep apnea    Tobacco abuse    Varicose veins      Past Surgical History:  Procedure Laterality Date   CARDIAC CATHETERIZATION N/A 01/09/2015   Procedure: Left Heart Cath and Coronary Angiography;  Surgeon: Arty Binning, MD;  Location: [facility] INVASIVE CV LAB;  Service: Cardiovascular;  Laterality: N/A;   CARDIAC CATHETERIZATION N/A 03/13/2015   Procedure: Left Heart Cath and Coronary Angiography;  Surgeon: Arty Binning, MD;  Location: [facility] INVASIVE CV LAB;  Service: Cardiovascular;  Laterality: N/A;   CORONARY ANGIOPLASTY     STENT   CORONARY STENT PLACEMENT     CYSTOSCOPY W/ URETERAL STENT REMOVAL  Right 05/13/2017   Procedure: CYSTOSCOPY WITH STENT REMOVAL;  Surgeon: Rea Cambridge, MD;  Location: [facility] ORS;  Service: Urology;  Laterality: Right;   CYSTOSCOPY WITH STENT PLACEMENT Right 03/07/2015   Procedure: CYSTOSCOPY WITH STENT PLACEMENT;  Surgeon: Rea Cambridge, MD;  Location: [facility] ORS;  Service: Urology;  Laterality: Right;   CYSTOSCOPY WITH STENT PLACEMENT Right 03/25/2017   Procedure: CYSTOSCOPY WITH STENT PLACEMENT;  Surgeon: Rea Cambridge, MD;  Location: [facility];  Service: Urology;  Laterality: Right;   CYSTOSCOPY WITH STENT PLACEMENT Right 07/12/2021   Procedure: CYSTOSCOPY WITH STENT PLACEMENT;  Surgeon: Rea Cambridge, MD;  Location: [facility];  Service: Urology;  Laterality: Right;   CYSTOSCOPY/RETROGRADE/URETEROSCOPY Right 02/24/2018   Procedure: CYSTOSCOPY/RETROGRADE/URETEROSCOPY;  Surgeon: Rea Cambridge, MD;  Location: [facility];  Service: Urology;  Laterality: Right;   CYSTOSCOPY/RETROGRADE/URETEROSCOPY/STONE EXTRACTION WITH BASKET Right 03/25/2017   Procedure: CYSTOSCOPY/RETROGRADE/URETEROSCOPY/STONE EXTRACTION WITH BASKET;  Surgeon: Rea Cambridge, MD;  Location: [facility];  Service: Urology;  Laterality: Right;   EXTRACORPOREAL SHOCK WAVE LITHOTRIPSY Right 03/09/2015   Procedure: EXTRACORPOREAL SHOCK WAVE LITHOTRIPSY (ESWL);  Surgeon: Rea Cambridge, MD;  Location: [facility];  Service: Urology;  Laterality: Right;   EXTRACORPOREAL SHOCK WAVE LITHOTRIPSY Right 05/11/2015   Procedure: EXTRACORPOREAL SHOCK WAVE LITHOTRIPSY (ESWL);  Surgeon:  Rea Cambridge, MD;  Location: [facility];  Service: Urology;  Laterality: Right;   EXTRACORPOREAL SHOCK WAVE LITHOTRIPSY Right 04/10/2017   Procedure: EXTRACORPOREAL SHOCK WAVE LITHOTRIPSY (ESWL);  Surgeon: Rea Cambridge, MD;  Location: [facility];  Service: Urology;  Laterality: Right;   EXTRACORPOREAL SHOCK WAVE LITHOTRIPSY Right 07/26/2021   Procedure: EXTRACORPOREAL SHOCK WAVE LITHOTRIPSY (ESWL);  Surgeon: Rea Cambridge, MD;   Location: [facility];  Service: Urology;  Laterality: Right;   LEFT HEART CATHETERIZATION WITH CORONARY ANGIOGRAM Right 06/19/2012   Procedure: LEFT HEART CATHETERIZATION WITH CORONARY ANGIOGRAM;  Surgeon: Mickiel Albany, MD;  Location: [facility] CATH LAB;  Service: Cardiovascular;  Laterality: Right;   PERCUTANEOUS CORONARY STENT INTERVENTION (PCI-S) Right 06/19/2012   Procedure: PERCUTANEOUS CORONARY STENT INTERVENTION (PCI-S);  Surgeon: Mickiel Albany, MD;  Location: [facility] CATH LAB;  Service: Cardiovascular;  Laterality: Right;   URETEROSCOPY WITH HOLMIUM LASER LITHOTRIPSY Right 02/24/2018   Procedure: URETEROSCOPY;  Surgeon: Rea Cambridge, MD;  Location: [facility] OR[facility];  Service: Urology;  Laterality: Right;     Current Outpatient Medications  Medication Sig Dispense Refill   amiodarone  (PACERONE ) 200 MG tablet Take 4 tablets/day for 1 week, then 3 tablets/day for 1 week, then 2 tablets/day for 1 week. Then follow up. 70 tablet 0   apixaban (ELIQUIS) 5 MG TABS tablet Take 1 tablet (5 mg total) by mouth 2 (two) times daily. 60 tablet 0   albuterol  (VENTOLIN  HFA) 108 (90 Base) MCG/ACT inhaler Inhale 2 puffs into the lungs every 6 (six) hours as needed for wheezing or shortness of breath.     atorvastatin  (LIPITOR) 40 MG tablet TAKE 1 TABLET BY MOUTH EVERY DAY 90 tablet 1   carvedilol  (COREG ) 6.25 MG tablet Take 1 tablet (6.25 mg total) by mouth 2 (two) times daily. Please call office to schedule an appt for further refills. Thank you 30 tablet 0   dapagliflozin  propanediol (FARXIGA ) 10 MG TABS tablet Take 1 tablet (10 mg total) by mouth daily before breakfast. 90 tablet 2   ondansetron  (ZOFRAN ) 4 MG tablet Take 1 tablet (4 mg total) by mouth every 8 (eight) hours as needed for nausea or vomiting. 20 tablet 0   oxyCODONE -acetaminophen  (PERCOCET) 5-325 MG tablet Take 1 tablet by mouth every 8 (eight) hours as needed for severe pain (pain score 7-10). 10 tablet 0   sacubitril -valsartan  (ENTRESTO ) 24-26 MG  Take 1 tablet by mouth 2 (two) times daily. 180 tablet 3   tamsulosin  (FLOMAX ) 0.4 MG CAPS capsule Take 1 capsule (0.4 mg total) by mouth 2 (two) times daily. 180 capsule 3   varenicline  (CHANTIX ) 1 MG tablet TAKE 1 TABLET BY MOUTH TWICE A DAY 180 tablet 0   No current facility-administered medications for this visit.    Allergies:   Nucynta  [tapentadol ], Spironolactone , and Codeine    Social History:   reports that he has been smoking cigarettes. He has a 20 pack-year smoking history. He has never used smokeless tobacco. He reports current alcohol use. He reports that he does not use drugs.   Family History:  family history includes Cancer in his father; Heart attack in his brother; Heart murmur in his mother.    ROS:     Review of Systems  Constitutional: Negative.   HENT: Negative.    Eyes: Negative.   Respiratory: Negative.    Gastrointestinal: Negative.   Genitourinary: Negative.   Musculoskeletal: Negative.   Skin: Negative.   Neurological: Negative.   Endo/Heme/Allergies: Negative.  Psychiatric/Behavioral: Negative.    All other systems reviewed and are negative.     All other systems are reviewed and negative.    PHYSICAL EXAM: VS:  BP (!) 102/58   Pulse 75   Ht 5\' 11"  (1.803 m)   Wt 220 lb (99.8 kg)   SpO2 96%   BMI 30.68 kg/m  , BMI Body mass index is 30.68 kg/m. Last weight:  Wt Readings from Last 3 Encounters:  11/27/23 220 lb (99.8 kg)  10/16/23 211 lb (95.7 kg)  08/08/23 208 lb (94.3 kg)     Physical Exam Vitals reviewed.  Constitutional:      Appearance: Normal appearance. He is normal weight.  HENT:     Head: Normocephalic.     Nose: Nose normal.     Mouth/Throat:     Mouth: Mucous membranes are moist.  Eyes:     Pupils: Pupils are equal, round, and reactive to light.  Cardiovascular:     Rate and Rhythm: Normal rate and regular rhythm.     Pulses: Normal pulses.     Heart sounds: Normal heart sounds.  Pulmonary:     Effort:  Pulmonary effort is normal.  Abdominal:     General: Abdomen is flat. Bowel sounds are normal.  Musculoskeletal:        General: Normal range of motion.     Cervical back: Normal range of motion.  Skin:    General: Skin is warm.  Neurological:     General: No focal deficit present.     Mental Status: He is alert.  Psychiatric:        Mood and Affect: Mood normal.       EKG:   Recent Labs: 01/29/2023: ALT 16; TSH 0.964 11/07/2023: BUN 22; Creatinine, Ser 1.15; Hemoglobin 16.1; Platelets 148; Potassium 3.5; Sodium 134    Lipid Panel    Component Value Date/Time   CHOL 134 01/29/2023 1543   TRIG 127 01/29/2023 1543   HDL 48 01/29/2023 1543   CHOLHDL 3.0 05/09/2022 0958   CHOLHDL 3.5 03/12/2015 0420   VLDL 24 03/12/2015 0420   LDLCALC 64 01/29/2023 1543      Other studies Reviewed: Additional studies/ records that were reviewed today include:  Review of the above records demonstrates:       No data to display            ASSESSMENT AND PLAN:    ICD-10-CM   1. SOB (shortness of breath)  R06.02 MYOCARDIAL PERFUSION IMAGING    amiodarone  (PACERONE ) 200 MG tablet    apixaban (ELIQUIS) 5 MG TABS tablet   had new onset afib with RVR in ER and will place on elliquis, stop asp, and amiodrone. Do stress test    2. HFrEF (heart failure with reduced ejection fraction) (HCC)  I50.20 MYOCARDIAL PERFUSION IMAGING    amiodarone  (PACERONE ) 200 MG tablet    apixaban (ELIQUIS) 5 MG TABS tablet   ECHO Showed moderate MR EF 28% on 5/25, will do stress test    3. Paroxysmal atrial fibrillation (HCC)  I48.0 MYOCARDIAL PERFUSION IMAGING    amiodarone  (PACERONE ) 200 MG tablet    apixaban (ELIQUIS) 5 MG TABS tablet    4. Dyslipidemia  E78.5 MYOCARDIAL PERFUSION IMAGING    amiodarone  (PACERONE ) 200 MG tablet    apixaban (ELIQUIS) 5 MG TABS tablet    5. Coronary artery disease involving native coronary artery of native heart without angina pectoris  I25.10 MYOCARDIAL PERFUSION  IMAGING  amiodarone  (PACERONE ) 200 MG tablet    apixaban (ELIQUIS) 5 MG TABS tablet    6. Old MI (myocardial infarction)  I25.2 MYOCARDIAL PERFUSION IMAGING    amiodarone  (PACERONE ) 200 MG tablet    apixaban (ELIQUIS) 5 MG TABS tablet       Problem List Items Addressed This Visit       Cardiovascular and Mediastinum   Old MI (myocardial infarction)   Relevant Medications   amiodarone  (PACERONE ) 200 MG tablet   apixaban (ELIQUIS) 5 MG TABS tablet   Other Relevant Orders   MYOCARDIAL PERFUSION IMAGING   Atrial fibrillation (HCC)   Relevant Medications   amiodarone  (PACERONE ) 200 MG tablet   apixaban (ELIQUIS) 5 MG TABS tablet   Other Relevant Orders   MYOCARDIAL PERFUSION IMAGING   HFrEF (heart failure with reduced ejection fraction) (HCC)   Relevant Medications   amiodarone  (PACERONE ) 200 MG tablet   apixaban (ELIQUIS) 5 MG TABS tablet   Other Relevant Orders   MYOCARDIAL PERFUSION IMAGING     Other   Dyslipidemia   Relevant Medications   amiodarone  (PACERONE ) 200 MG tablet   apixaban (ELIQUIS) 5 MG TABS tablet   Other Relevant Orders   MYOCARDIAL PERFUSION IMAGING   Other Visit Diagnoses       SOB (shortness of breath)    -  Primary   had new onset afib with RVR in ER and will place on elliquis, stop asp, and amiodrone. Do stress test   Relevant Medications   amiodarone  (PACERONE ) 200 MG tablet   apixaban (ELIQUIS) 5 MG TABS tablet   Other Relevant Orders   MYOCARDIAL PERFUSION IMAGING     Coronary artery disease involving native coronary artery of native heart without angina pectoris       Relevant Medications   amiodarone  (PACERONE ) 200 MG tablet   apixaban (ELIQUIS) 5 MG TABS tablet   Other Relevant Orders   MYOCARDIAL PERFUSION IMAGING          Disposition:   Return in about 2 weeks (around 12/11/2023) for stress test and f/u.    Total time spent: 30 minutes  Signed,  Debborah Fairly, MD  11/27/2023 2:16 PM    [facility]

## 2023-11-28 ENCOUNTER — Ambulatory Visit: Admitting: Cardiovascular Disease

## 2023-12-15 ENCOUNTER — Other Ambulatory Visit: Payer: Self-pay | Admitting: Cardiovascular Disease

## 2023-12-15 DIAGNOSIS — E785 Hyperlipidemia, unspecified: Secondary | ICD-10-CM

## 2023-12-15 DIAGNOSIS — I252 Old myocardial infarction: Secondary | ICD-10-CM

## 2023-12-15 DIAGNOSIS — R0602 Shortness of breath: Secondary | ICD-10-CM

## 2023-12-15 DIAGNOSIS — I480 Paroxysmal atrial fibrillation: Secondary | ICD-10-CM

## 2023-12-15 DIAGNOSIS — I5020 Unspecified systolic (congestive) heart failure: Secondary | ICD-10-CM

## 2023-12-15 DIAGNOSIS — I2510 Atherosclerotic heart disease of native coronary artery without angina pectoris: Secondary | ICD-10-CM

## 2023-12-22 ENCOUNTER — Ambulatory Visit

## 2023-12-24 ENCOUNTER — Other Ambulatory Visit: Payer: Self-pay | Admitting: Cardiovascular Disease

## 2023-12-24 DIAGNOSIS — I480 Paroxysmal atrial fibrillation: Secondary | ICD-10-CM

## 2023-12-24 DIAGNOSIS — I2510 Atherosclerotic heart disease of native coronary artery without angina pectoris: Secondary | ICD-10-CM

## 2023-12-24 DIAGNOSIS — I5020 Unspecified systolic (congestive) heart failure: Secondary | ICD-10-CM

## 2023-12-24 DIAGNOSIS — R0602 Shortness of breath: Secondary | ICD-10-CM

## 2023-12-24 DIAGNOSIS — I252 Old myocardial infarction: Secondary | ICD-10-CM

## 2023-12-24 DIAGNOSIS — E785 Hyperlipidemia, unspecified: Secondary | ICD-10-CM

## 2023-12-25 ENCOUNTER — Ambulatory Visit: Admitting: Cardiovascular Disease

## 2024-01-02 ENCOUNTER — Other Ambulatory Visit: Payer: Self-pay | Admitting: Cardiovascular Disease

## 2024-01-02 DIAGNOSIS — I5020 Unspecified systolic (congestive) heart failure: Secondary | ICD-10-CM

## 2024-01-02 DIAGNOSIS — E785 Hyperlipidemia, unspecified: Secondary | ICD-10-CM

## 2024-01-02 DIAGNOSIS — I480 Paroxysmal atrial fibrillation: Secondary | ICD-10-CM

## 2024-01-02 DIAGNOSIS — R0602 Shortness of breath: Secondary | ICD-10-CM

## 2024-01-02 DIAGNOSIS — I252 Old myocardial infarction: Secondary | ICD-10-CM

## 2024-01-02 DIAGNOSIS — I2510 Atherosclerotic heart disease of native coronary artery without angina pectoris: Secondary | ICD-10-CM

## 2024-01-04 ENCOUNTER — Other Ambulatory Visit: Payer: Self-pay | Admitting: Urology

## 2024-01-08 ENCOUNTER — Encounter

## 2024-01-08 ENCOUNTER — Telehealth: Payer: Self-pay | Admitting: Urology

## 2024-01-08 ENCOUNTER — Other Ambulatory Visit: Payer: Self-pay | Admitting: *Deleted

## 2024-01-08 MED ORDER — TAMSULOSIN HCL 0.4 MG PO CAPS: 0.4000 mg | ORAL_CAPSULE | Freq: Two times a day (BID) | ORAL | 3 refills | Status: AC

## 2024-01-08 NOTE — Telephone Encounter (Signed)
 Patient called stating he called pharmacy to get his RX for Flomax  filled and it was denied. Patient was last seen by [facility] on 01/09/23. Patient would like to know if a new script can be faxed or does he need to be seen first being since it's been a year? Please advise patient.

## 2024-01-08 NOTE — Telephone Encounter (Addendum)
 Refilled medication and scheduled yearly appt.

## 2024-01-15 ENCOUNTER — Ambulatory Visit: Admitting: Cardiovascular Disease

## 2024-01-16 ENCOUNTER — Ambulatory Visit: Payer: Medicare Other | Admitting: Cardiology

## 2024-01-19 ENCOUNTER — Encounter: Payer: Self-pay | Admitting: Cardiovascular Disease

## 2024-01-19 ENCOUNTER — Other Ambulatory Visit: Payer: Self-pay

## 2024-01-19 ENCOUNTER — Ambulatory Visit (INDEPENDENT_AMBULATORY_CARE_PROVIDER_SITE_OTHER): Admitting: Cardiovascular Disease

## 2024-01-19 VITALS — BP 108/67 | HR 52 | Ht 71.0 in | Wt 217.8 lb

## 2024-01-19 DIAGNOSIS — I5020 Unspecified systolic (congestive) heart failure: Secondary | ICD-10-CM | POA: Diagnosis not present

## 2024-01-19 DIAGNOSIS — E785 Hyperlipidemia, unspecified: Secondary | ICD-10-CM | POA: Diagnosis not present

## 2024-01-19 DIAGNOSIS — R0602 Shortness of breath: Secondary | ICD-10-CM

## 2024-01-19 DIAGNOSIS — I480 Paroxysmal atrial fibrillation: Secondary | ICD-10-CM | POA: Diagnosis not present

## 2024-01-19 DIAGNOSIS — I252 Old myocardial infarction: Secondary | ICD-10-CM

## 2024-01-19 DIAGNOSIS — Z0130 Encounter for examination of blood pressure without abnormal findings: Secondary | ICD-10-CM

## 2024-01-19 DIAGNOSIS — I340 Nonrheumatic mitral (valve) insufficiency: Secondary | ICD-10-CM

## 2024-01-19 DIAGNOSIS — I2510 Atherosclerotic heart disease of native coronary artery without angina pectoris: Secondary | ICD-10-CM

## 2024-01-19 MED ORDER — ATORVASTATIN CALCIUM 40 MG PO TABS: 40.0000 mg | ORAL_TABLET | Freq: Every day | ORAL | 1 refills | Status: AC

## 2024-01-19 MED ORDER — CARVEDILOL 6.25 MG PO TABS: 6.2500 mg | ORAL_TABLET | Freq: Two times a day (BID) | ORAL | 2 refills | Status: AC

## 2024-01-19 MED ORDER — DAPAGLIFLOZIN PROPANEDIOL 10 MG PO TABS: 10.0000 mg | ORAL_TABLET | Freq: Every day | ORAL | 2 refills | Status: AC

## 2024-01-19 MED ORDER — APIXABAN 5 MG PO TABS: 5.0000 mg | ORAL_TABLET | Freq: Two times a day (BID) | ORAL | 2 refills | Status: AC

## 2024-01-19 NOTE — Progress Notes (Signed)
 Cardiology Office Note   Date:  01/19/2024   ID:  Rilley, Stash 1955/08/05, MRN [id number]  PCP:  [facility]  Cardiologist:  Denyse Bathe, MD      History of Present Illness: [name] Walker is a 68 y.o. male who presents for  Chief Complaint  Patient presents with   Follow-up    Has dizziness getting up.      Past Medical History:  Diagnosis Date   Bronchitis    CHRONIC   Chronic HFrEF (heart failure with reduced ejection fraction) (HCC)    a. 03/2021 Echo: EF 30-35%, GrI DD.   Coronary artery disease    a. 07/16/12 Ant STEMI/PCI: LAD 100p (thrombectomy and 3.5x20 Veriflex BMS); b. 12/2014 Cath: patent stent; c. 03/2015 Cath: LM nl, LAD 10 ISR, 65m, D1 50p, LCX 20 diff throughout, RCA nl-->Med rx.   Erectile dysfunction    History of kidney stones    HOH (hard of hearing)    Hypercholesterolemia    Hypertension    Ischemic cardiomyopathy    a. 11/2015 Echo: EF 30-35%, diff HK; b. Refused ICD; c. 03/2021 Echo: EF 30-35%, GrI DD, nl RV fxn, mild MR.   MI (myocardial infarction) (HCC) 06/19/2012   being considered for defibilator but patient now refuses this.   Nephrolithiasis    NSTEMI (non-ST elevated myocardial infarction) (HCC) 03/2015   Patent BMS - LAD stent.   Sleep apnea    Tobacco abuse    Varicose veins      Past Surgical History:  Procedure Laterality Date   CARDIAC CATHETERIZATION N/A 01/09/2015   Procedure: Left Heart Cath and Coronary Angiography;  Surgeon: Victory LELON Sharps, MD;  Location: [facility] INVASIVE CV LAB;  Service: Cardiovascular;  Laterality: N/A;   CARDIAC CATHETERIZATION N/A 03/13/2015   Procedure: Left Heart Cath and Coronary Angiography;  Surgeon: Victory LELON Sharps, MD;  Location: [facility] INVASIVE CV LAB;  Service: Cardiovascular;  Laterality: N/A;   CORONARY ANGIOPLASTY     STENT   CORONARY STENT PLACEMENT     CYSTOSCOPY W/ URETERAL STENT REMOVAL Right 05/13/2017   Procedure: CYSTOSCOPY WITH STENT REMOVAL;  Surgeon: Kassie Ozell SAUNDERS, MD;  Location: [facility];  Service: Urology;  Laterality: Right;   CYSTOSCOPY WITH STENT PLACEMENT Right 03/07/2015   Procedure: CYSTOSCOPY WITH STENT PLACEMENT;  Surgeon: Ozell SAUNDERS Kassie, MD;  Location: [facility];  Service: Urology;  Laterality: Right;   CYSTOSCOPY WITH STENT PLACEMENT Right 03/25/2017   Procedure: CYSTOSCOPY WITH STENT PLACEMENT;  Surgeon: Kassie Ozell SAUNDERS, MD;  Location: [facility];  Service: Urology;  Laterality: Right;   CYSTOSCOPY WITH STENT PLACEMENT Right 07/12/2021   Procedure: CYSTOSCOPY WITH STENT PLACEMENT;  Surgeon: Kassie Ozell SAUNDERS, MD;  Location: [facility];  Service: Urology;  Laterality: Right;   CYSTOSCOPY/RETROGRADE/URETEROSCOPY Right 02/24/2018   Procedure: CYSTOSCOPY/RETROGRADE/URETEROSCOPY;  Surgeon: Kassie Ozell SAUNDERS, MD;  Location: [facility];  Service: Urology;  Laterality: Right;   CYSTOSCOPY/RETROGRADE/URETEROSCOPY/STONE EXTRACTION WITH BASKET Right 03/25/2017   Procedure: CYSTOSCOPY/RETROGRADE/URETEROSCOPY/STONE EXTRACTION WITH BASKET;  Surgeon: Kassie Ozell SAUNDERS, MD;  Location: [facility];  Service: Urology;  Laterality: Right;   EXTRACORPOREAL SHOCK WAVE LITHOTRIPSY Right 03/09/2015   Procedure: EXTRACORPOREAL SHOCK WAVE LITHOTRIPSY (ESWL);  Surgeon: Ozell SAUNDERS Kassie, MD;  Location: [facility];  Service: Urology;  Laterality: Right;   EXTRACORPOREAL SHOCK WAVE LITHOTRIPSY Right 05/11/2015   Procedure: EXTRACORPOREAL SHOCK WAVE LITHOTRIPSY (ESWL);  Surgeon: Ozell SAUNDERS Kassie, MD;  Location: [facility];  Service: Urology;  Laterality: Right;  EXTRACORPOREAL SHOCK WAVE LITHOTRIPSY Right 04/10/2017   Procedure: EXTRACORPOREAL SHOCK WAVE LITHOTRIPSY (ESWL);  Surgeon: Kassie Ozell SAUNDERS, MD;  Location: [facility];  Service: Urology;  Laterality: Right;   EXTRACORPOREAL SHOCK WAVE LITHOTRIPSY Right 07/26/2021   Procedure: EXTRACORPOREAL SHOCK WAVE LITHOTRIPSY (ESWL);  Surgeon: Kassie Ozell SAUNDERS, MD;  Location: [facility];  Service: Urology;  Laterality: Right;   LEFT HEART  CATHETERIZATION WITH CORONARY ANGIOGRAM Right 06/19/2012   Procedure: LEFT HEART CATHETERIZATION WITH CORONARY ANGIOGRAM;  Surgeon: Victory LELON Claudene DOUGLAS, MD;  Location: [facility] CATH LAB;  Service: Cardiovascular;  Laterality: Right;   PERCUTANEOUS CORONARY STENT INTERVENTION (PCI-S) Right 06/19/2012   Procedure: PERCUTANEOUS CORONARY STENT INTERVENTION (PCI-S);  Surgeon: Victory LELON Claudene DOUGLAS, MD;  Location: [facility] CATH LAB;  Service: Cardiovascular;  Laterality: Right;   URETEROSCOPY WITH HOLMIUM LASER LITHOTRIPSY Right 02/24/2018   Procedure: URETEROSCOPY;  Surgeon: Kassie Ozell SAUNDERS, MD;  Location: [facility];  Service: Urology;  Laterality: Right;     Current Outpatient Medications  Medication Sig Dispense Refill   albuterol  (VENTOLIN  HFA) 108 (90 Base) MCG/ACT inhaler Inhale 2 puffs into the lungs every 6 (six) hours as needed for wheezing or shortness of breath.     amiodarone  (PACERONE ) 200 MG tablet TAKE 4 TABLETS BY MOUTH DAILY FOR 1 WEEK, THEN 3 DAILY FOR 1 WEEK, THEN 2 DAILY FOR 1 WEEK, THEN FOLLOW UP 70 tablet 0   atorvastatin  (LIPITOR) 40 MG tablet TAKE 1 TABLET BY MOUTH EVERY DAY 90 tablet 1   carvedilol  (COREG ) 6.25 MG tablet Take 1 tablet (6.25 mg total) by mouth 2 (two) times daily. Please call office to schedule an appt for further refills. Thank you 30 tablet 0   dapagliflozin  propanediol (FARXIGA ) 10 MG TABS tablet Take 1 tablet (10 mg total) by mouth daily before breakfast. 90 tablet 2   ELIQUIS  5 MG TABS tablet TAKE 1 TABLET BY MOUTH TWICE A DAY 60 tablet 0   ondansetron  (ZOFRAN ) 4 MG tablet Take 1 tablet (4 mg total) by mouth every 8 (eight) hours as needed for nausea or vomiting. 20 tablet 0   oxyCODONE -acetaminophen  (PERCOCET) 5-325 MG tablet Take 1 tablet by mouth every 8 (eight) hours as needed for severe pain (pain score 7-10). 10 tablet 0   sacubitril -valsartan  (ENTRESTO ) 24-26 MG Take 1 tablet by mouth 2 (two) times daily. 180 tablet 3   tamsulosin  (FLOMAX ) 0.4 MG CAPS capsule Take 1  capsule (0.4 mg total) by mouth 2 (two) times daily. 180 capsule 3   varenicline  (CHANTIX ) 1 MG tablet TAKE 1 TABLET BY MOUTH TWICE A DAY (Patient not taking: Reported on 01/19/2024) 180 tablet 0   No current facility-administered medications for this visit.    Allergies:   Nucynta  [tapentadol ], Spironolactone , and Codeine    Social History:   reports that he has been smoking cigarettes. He has a 20 pack-year smoking history. He has never used smokeless tobacco. He reports current alcohol use. He reports that he does not use drugs.   Family History:  family history includes Cancer in his father; Heart attack in his brother; Heart murmur in his mother.    ROS:     Review of Systems  Constitutional: Negative.   HENT: Negative.    Eyes: Negative.   Respiratory: Negative.    Gastrointestinal: Negative.   Genitourinary: Negative.   Musculoskeletal: Negative.   Skin: Negative.   Neurological: Negative.   Endo/Heme/Allergies: Negative.   Psychiatric/Behavioral: Negative.    All other systems reviewed and are  negative.     All other systems are reviewed and negative.    PHYSICAL EXAM: VS:  BP 108/67   Pulse (!) 52   Ht 5' 11 (1.803 m)   Wt 217 lb 12.8 oz (98.8 kg)   SpO2 97%   BMI 30.38 kg/m  , BMI Body mass index is 30.38 kg/m. Last weight:  Wt Readings from Last 3 Encounters:  01/19/24 217 lb 12.8 oz (98.8 kg)  11/27/23 220 lb (99.8 kg)  10/16/23 211 lb (95.7 kg)     Physical Exam Vitals reviewed.  Constitutional:      Appearance: Normal appearance. He is normal weight.  HENT:     Head: Normocephalic.     Nose: Nose normal.     Mouth/Throat:     Mouth: Mucous membranes are moist.  Eyes:     Pupils: Pupils are equal, round, and reactive to light.  Cardiovascular:     Rate and Rhythm: Normal rate and regular rhythm.     Pulses: Normal pulses.     Heart sounds: Normal heart sounds.  Pulmonary:     Effort: Pulmonary effort is normal.  Abdominal:     General:  Abdomen is flat. Bowel sounds are normal.  Musculoskeletal:        General: Normal range of motion.     Cervical back: Normal range of motion.  Skin:    General: Skin is warm.  Neurological:     General: No focal deficit present.     Mental Status: He is alert.  Psychiatric:        Mood and Affect: Mood normal.       EKG:   Recent Labs: 01/29/2023: ALT 16; TSH 0.964 11/07/2023: BUN 22; Creatinine, Ser 1.15; Hemoglobin 16.1; Platelets 148; Potassium 3.5; Sodium 134    Lipid Panel    Component Value Date/Time   CHOL 134 01/29/2023 1543   TRIG 127 01/29/2023 1543   HDL 48 01/29/2023 1543   CHOLHDL 3.0 05/09/2022 0958   CHOLHDL 3.5 03/12/2015 0420   VLDL 24 03/12/2015 0420   LDLCALC 64 01/29/2023 1543      Other studies Reviewed: Additional studies/ records that were reviewed today include:  Review of the above records demonstrates:       No data to display            ASSESSMENT AND PLAN:    ICD-10-CM   1. HFrEF (heart failure with reduced ejection fraction) (HCC)  I50.20     2. Paroxysmal atrial fibrillation (HCC)  I48.0     3. Dyslipidemia  E78.5     4. Coronary artery disease involving native coronary artery of native heart without angina pectoris  I25.10     5. SOB (shortness of breath)  R06.02    Stress test schedualled but not done yet.    6. Old MI (myocardial infarction)  I25.2     7. Nonrheumatic mitral valve regurgitation  I34.0    moderate MR.       Problem List Items Addressed This Visit       Cardiovascular and Mediastinum   Old MI (myocardial infarction)   Atrial fibrillation (HCC)   HFrEF (heart failure with reduced ejection fraction) (HCC) - Primary     Other   Dyslipidemia   Other Visit Diagnoses       Coronary artery disease involving native coronary artery of native heart without angina pectoris         SOB (shortness of breath)  Stress test schedualled but not done yet.     Nonrheumatic mitral valve regurgitation        moderate MR.          Disposition:   Return today (on 01/19/2024) for Says has f/u after stress test, make sure he dose.    Total time spent: 35 minutes  Signed,  Denyse Bathe, MD  01/19/2024 3:03 PM    [facility]

## 2024-01-22 ENCOUNTER — Ambulatory Visit: Admitting: Cardiovascular Disease

## 2024-01-24 ENCOUNTER — Other Ambulatory Visit: Payer: Self-pay | Admitting: Cardiovascular Disease

## 2024-01-24 DIAGNOSIS — I2510 Atherosclerotic heart disease of native coronary artery without angina pectoris: Secondary | ICD-10-CM

## 2024-01-24 DIAGNOSIS — I252 Old myocardial infarction: Secondary | ICD-10-CM

## 2024-01-24 DIAGNOSIS — E785 Hyperlipidemia, unspecified: Secondary | ICD-10-CM

## 2024-01-24 DIAGNOSIS — R0602 Shortness of breath: Secondary | ICD-10-CM

## 2024-01-24 DIAGNOSIS — I480 Paroxysmal atrial fibrillation: Secondary | ICD-10-CM

## 2024-01-24 DIAGNOSIS — I5020 Unspecified systolic (congestive) heart failure: Secondary | ICD-10-CM

## 2024-02-05 ENCOUNTER — Encounter

## 2024-02-09 ENCOUNTER — Ambulatory Visit
Admission: RE | Admit: 2024-02-09 | Discharge: 2024-02-09 | Disposition: A | Discharge status: Home / Self Care | Source: Ambulatory Visit | Attending: Physician Assistant | Admitting: Physician Assistant

## 2024-02-09 ENCOUNTER — Encounter: Payer: Self-pay | Admitting: Physician Assistant

## 2024-02-09 ENCOUNTER — Ambulatory Visit: Admitting: Physician Assistant

## 2024-02-09 VITALS — BP 125/73 | HR 46 | Ht 71.0 in | Wt 220.0 lb

## 2024-02-09 DIAGNOSIS — Z87442 Personal history of urinary calculi: Secondary | ICD-10-CM

## 2024-02-09 DIAGNOSIS — R109 Unspecified abdominal pain: Secondary | ICD-10-CM | POA: Diagnosis not present

## 2024-02-09 DIAGNOSIS — N200 Calculus of kidney: Secondary | ICD-10-CM | POA: Diagnosis not present

## 2024-02-09 DIAGNOSIS — K5730 Diverticulosis of large intestine without perforation or abscess without bleeding: Secondary | ICD-10-CM | POA: Diagnosis not present

## 2024-02-09 DIAGNOSIS — R3129 Other microscopic hematuria: Secondary | ICD-10-CM | POA: Diagnosis not present

## 2024-02-09 DIAGNOSIS — K8689 Other specified diseases of pancreas: Secondary | ICD-10-CM | POA: Diagnosis not present

## 2024-02-09 LAB — URINALYSIS, COMPLETE
Bilirubin, UA: NEGATIVE
Ketones, UA: NEGATIVE
Leukocytes,UA: NEGATIVE
Nitrite, UA: NEGATIVE
Specific Gravity, UA: 1.025 (ref 1.005–1.030)
Urobilinogen, Ur: 0.2 mg/dL (ref 0.2–1.0)
pH, UA: 6 (ref 5.0–7.5)

## 2024-02-09 LAB — MICROSCOPIC EXAMINATION

## 2024-02-09 NOTE — Progress Notes (Signed)
 02/09/2024 10:05 AM   [name] Walker 02-Mar-1956 [id number]  CC: Chief Complaint  Patient presents with   Nephrolithiasis   HPI: [name] Walker is a 68 y.o. male with PMH nephrolithiasis and BPH with LUTS on tamsulosin  0.8 mg who presents today for evaluation of a possible acute stone episode.   Today he reports about 2 weeks of constant right flank pain that eases somewhat with ibuprofen.  He reports nausea, but denies fever, chills, vomiting, or bowel changes.  He had a KUB on 05/02/2023 with no radiopaque urolithiasis.  Stat CT stone study today shows punctate nonobstructing right renal stones, but no obstructing ureteral stones or hydronephrosis.  Today he also mentions an approximate 40-pack-year smoking history, having quit within the past year.  He has also noticed some recent decrease in ejaculate volume, though he has been on 0.8 mg of Flomax  daily chronically.  He reports a history of bladder stones, having had cystolitholapaxy with Dr. Kassie in the past.  In-office UA today positive for 3+ glucose, trace protein, and 2+ blood; urine microscopy with 11-30 RBCs/HPF.  PMH: Past Medical History:  Diagnosis Date   Bronchitis    CHRONIC   Chronic HFrEF (heart failure with reduced ejection fraction) (HCC)    a. 03/2021 Echo: EF 30-35%, GrI DD.   Coronary artery disease    a. 05/2012 Ant STEMI/PCI: LAD 100p (thrombectomy and 3.5x20 Veriflex BMS); b. 12/2014 Cath: patent stent; c. 03/2015 Cath: LM nl, LAD 10 ISR, 60m, D1 50p, LCX 20 diff throughout, RCA nl-->Med rx.   Erectile dysfunction    History of kidney stones    HOH (hard of hearing)    Hypercholesterolemia    Hypertension    Ischemic cardiomyopathy    a. 11/2015 Echo: EF 30-35%, diff HK; b. Refused ICD; c. 03/2021 Echo: EF 30-35%, GrI DD, nl RV fxn, mild MR.   MI (myocardial infarction) (HCC) 06/19/2012   being considered for defibilator but patient now refuses this.   Nephrolithiasis    NSTEMI (non-ST elevated  myocardial infarction) (HCC) 03/2015   Patent BMS - LAD stent.   Sleep apnea    Tobacco abuse    Varicose veins     Surgical History: Past Surgical History:  Procedure Laterality Date   CARDIAC CATHETERIZATION N/A 01/09/2015   Procedure: Left Heart Cath and Coronary Angiography;  Surgeon: Victory LELON Sharps, MD;  Location: [facility] INVASIVE CV LAB;  Service: Cardiovascular;  Laterality: N/A;   CARDIAC CATHETERIZATION N/A 03/13/2015   Procedure: Left Heart Cath and Coronary Angiography;  Surgeon: Victory LELON Sharps, MD;  Location: [facility] INVASIVE CV LAB;  Service: Cardiovascular;  Laterality: N/A;   CORONARY ANGIOPLASTY     STENT   CORONARY STENT PLACEMENT     CYSTOSCOPY W/ URETERAL STENT REMOVAL Right 05/13/2017   Procedure: CYSTOSCOPY WITH STENT REMOVAL;  Surgeon: Kassie Ozell SAUNDERS, MD;  Location: [facility];  Service: Urology;  Laterality: Right;   CYSTOSCOPY WITH STENT PLACEMENT Right 03/07/2015   Procedure: CYSTOSCOPY WITH STENT PLACEMENT;  Surgeon: Ozell SAUNDERS Kassie, MD;  Location: [facility];  Service: Urology;  Laterality: Right;   CYSTOSCOPY WITH STENT PLACEMENT Right 03/25/2017   Procedure: CYSTOSCOPY WITH STENT PLACEMENT;  Surgeon: Kassie Ozell SAUNDERS, MD;  Location: [facility];  Service: Urology;  Laterality: Right;   CYSTOSCOPY WITH STENT PLACEMENT Right 07/12/2021   Procedure: CYSTOSCOPY WITH STENT PLACEMENT;  Surgeon: Kassie Ozell SAUNDERS, MD;  Location: [facility];  Service: Urology;  Laterality: Right;   CYSTOSCOPY/RETROGRADE/URETEROSCOPY Right 02/24/2018  Procedure: CYSTOSCOPY/RETROGRADE/URETEROSCOPY;  Surgeon: Kassie Ozell SAUNDERS, MD;  Location: [facility];  Service: Urology;  Laterality: Right;   CYSTOSCOPY/RETROGRADE/URETEROSCOPY/STONE EXTRACTION WITH BASKET Right 03/25/2017   Procedure: CYSTOSCOPY/RETROGRADE/URETEROSCOPY/STONE EXTRACTION WITH BASKET;  Surgeon: Kassie Ozell SAUNDERS, MD;  Location: [facility];  Service: Urology;  Laterality: Right;   EXTRACORPOREAL SHOCK WAVE LITHOTRIPSY Right 03/09/2015   Procedure:  EXTRACORPOREAL SHOCK WAVE LITHOTRIPSY (ESWL);  Surgeon: Ozell SAUNDERS Kassie, MD;  Location: [facility];  Service: Urology;  Laterality: Right;   EXTRACORPOREAL SHOCK WAVE LITHOTRIPSY Right 05/11/2015   Procedure: EXTRACORPOREAL SHOCK WAVE LITHOTRIPSY (ESWL);  Surgeon: Ozell SAUNDERS Kassie, MD;  Location: [facility];  Service: Urology;  Laterality: Right;   EXTRACORPOREAL SHOCK WAVE LITHOTRIPSY Right 04/10/2017   Procedure: EXTRACORPOREAL SHOCK WAVE LITHOTRIPSY (ESWL);  Surgeon: Kassie Ozell SAUNDERS, MD;  Location: [facility];  Service: Urology;  Laterality: Right;   EXTRACORPOREAL SHOCK WAVE LITHOTRIPSY Right 07/26/2021   Procedure: EXTRACORPOREAL SHOCK WAVE LITHOTRIPSY (ESWL);  Surgeon: Kassie Ozell SAUNDERS, MD;  Location: [facility];  Service: Urology;  Laterality: Right;   LEFT HEART CATHETERIZATION WITH CORONARY ANGIOGRAM Right 06/19/2012   Procedure: LEFT HEART CATHETERIZATION WITH CORONARY ANGIOGRAM;  Surgeon: Victory LELON Claudene DOUGLAS, MD;  Location: [facility] CATH LAB;  Service: Cardiovascular;  Laterality: Right;   PERCUTANEOUS CORONARY STENT INTERVENTION (PCI-S) Right 06/19/2012   Procedure: PERCUTANEOUS CORONARY STENT INTERVENTION (PCI-S);  Surgeon: Victory LELON Claudene DOUGLAS, MD;  Location: [facility] CATH LAB;  Service: Cardiovascular;  Laterality: Right;   URETEROSCOPY WITH HOLMIUM LASER LITHOTRIPSY Right 02/24/2018   Procedure: URETEROSCOPY;  Surgeon: Kassie Ozell SAUNDERS, MD;  Location: [facility];  Service: Urology;  Laterality: Right;    Home Medications:  Allergies as of 02/09/2024       Reactions   Nucynta  [tapentadol ] Nausea Only, Other (See Comments)   Dizziness, woozy.  Sick to his stomach   Spironolactone     Diarrhea/headache   Codeine Other (See Comments)   Too drowsy        Medication List        Accurate as of February 09, 2024 10:05 AM. If you have any questions, ask your nurse or doctor.          albuterol  108 (90 Base) MCG/ACT inhaler Commonly known as: VENTOLIN  HFA Inhale 2 puffs into the lungs every 6 (six)  hours as needed for wheezing or shortness of breath.   amiodarone  200 MG tablet Commonly known as: PACERONE  TAKE 4 TABLETS BY MOUTH DAILY FOR 1 WEEK, THEN 3 DAILY FOR 1 WEEK, THEN 2 DAILY FOR 1 WEEK, THEN FOLLOW UP   apixaban  5 MG Tabs tablet Commonly known as: Eliquis  Take 1 tablet (5 mg total) by mouth 2 (two) times daily.   atorvastatin  40 MG tablet Commonly known as: LIPITOR Take 1 tablet (40 mg total) by mouth daily.   carvedilol  6.25 MG tablet Commonly known as: COREG  Take 1 tablet (6.25 mg total) by mouth 2 (two) times daily. Please call office to schedule an appt for further refills. Thank you   dapagliflozin  propanediol 10 MG Tabs tablet Commonly known as: Farxiga  Take 1 tablet (10 mg total) by mouth daily before breakfast.   Entresto  24-26 MG Generic drug: sacubitril -valsartan  Take 1 tablet by mouth 2 (two) times daily.   ondansetron  4 MG tablet Commonly known as: Zofran  Take 1 tablet (4 mg total) by mouth every 8 (eight) hours as needed for nausea or vomiting.   oxyCODONE -acetaminophen  5-325 MG tablet Commonly known as: Percocet Take 1 tablet by mouth every 8 (eight)  hours as needed for severe pain (pain score 7-10).   tamsulosin  0.4 MG Caps capsule Commonly known as: Flomax  Take 1 capsule (0.4 mg total) by mouth 2 (two) times daily.   varenicline  1 MG tablet Commonly known as: CHANTIX  TAKE 1 TABLET BY MOUTH TWICE A DAY        Allergies:  Allergies  Allergen Reactions   Nucynta  [Tapentadol ] Nausea Only and Other (See Comments)    Dizziness, woozy.  Sick to his stomach   Spironolactone      Diarrhea/headache   Codeine Other (See Comments)    Too drowsy    Family History: Family History  Problem Relation Age of Onset   Cancer Father    Heart murmur Mother    Heart attack Brother     Social History:   reports that he has been smoking cigarettes. He has a 20 pack-year smoking history. He has never used smokeless tobacco. He reports current  alcohol use. He reports that he does not use drugs.  Physical Exam: BP 125/73   Pulse (!) 46   Ht 5' 11 (1.803 m)   Wt 220 lb (99.8 kg)   SpO2 95%   BMI 30.68 kg/m   Constitutional:  Alert and oriented, no acute distress, nontoxic appearing HEENT: [place], AT Cardiovascular: No clubbing, cyanosis, or edema Respiratory: Normal respiratory effort, no increased work of breathing Skin: No rashes, bruises or suspicious lesions Neurologic: Grossly intact, no focal deficits, moving all 4 extremities Psychiatric: Normal mood and affect  Laboratory Data: Results for orders placed or performed during the hospital encounter of 11/07/23  Basic metabolic panel   Collection Time: 11/07/23 10:46 AM  Result Value Ref Range   Sodium 134 (L) 135 - 145 mmol/L   Potassium 3.5 3.5 - 5.1 mmol/L   Chloride 104 98 - 111 mmol/L   CO2 22 22 - 32 mmol/L   Glucose, Bld 129 (H) 70 - 99 mg/dL   BUN 22 8 - 23 mg/dL   Creatinine, Ser 8.84 0.61 - 1.24 mg/dL   Calcium  9.1 8.9 - 10.3 mg/dL   GFR, Estimated >39 >39 mL/min   Anion gap 8 5 - 15  CBC   Collection Time: 11/07/23 10:46 AM  Result Value Ref Range   WBC 7.5 4.0 - 10.5 K/uL   RBC 4.74 4.22 - 5.81 MIL/uL   Hemoglobin 16.1 13.0 - 17.0 g/dL   HCT 54.3 60.9 - 47.9 %   MCV 96.2 80.0 - 100.0 fL   MCH 34.0 26.0 - 34.0 pg   MCHC 35.3 30.0 - 36.0 g/dL   RDW 87.2 88.4 - 84.4 %   Platelets 148 (L) 150 - 400 K/uL   nRBC 0.0 0.0 - 0.2 %  Troponin I (High Sensitivity)   Collection Time: 11/07/23 10:46 AM  Result Value Ref Range   Troponin I (High Sensitivity) 10 <18 ng/L  Troponin I (High Sensitivity)   Collection Time: 11/07/23 12:41 PM  Result Value Ref Range   Troponin I (High Sensitivity) 9 <18 ng/L   Pertinent Imaging: CT stone study, 02/09/2024: CLINICAL DATA:  Abdominal/flank pain, stone suspected   Worsening right flank pain for 2-3 weeks. History of kidney stones with lithotripsy.   EXAM: CT ABDOMEN AND PELVIS WITHOUT CONTRAST    TECHNIQUE: Multidetector CT imaging of the abdomen and pelvis was performed following the standard protocol without IV contrast.   RADIATION DOSE REDUCTION: This exam was performed according to the departmental dose-optimization program which includes automated exposure control, adjustment  of the mA and/or kV according to patient size and/or use of iterative reconstruction technique.   COMPARISON:  Abdominopelvic CT 07/12/2021.   FINDINGS: Lower chest: Interval improved subsegmental atelectasis at the left lung base. The lung bases are otherwise clear. No significant pleural or pericardial effusion.   Hepatobiliary: The liver appears unremarkable as imaged in the noncontrast state. No evidence of gallstones, gallbladder wall thickening or biliary dilatation.   Pancreas: Mild generalized atrophy. No ductal dilatation or surrounding inflammation.   Spleen: Normal in size without focal abnormality.   Adrenals/Urinary Tract: Both adrenal glands appear normal. Punctate nonobstructing right renal calculi, best seen on the reformatted images. No residual hydronephrosis, ureteral calculus or perinephric soft tissue stranding. Grossly stable probable small cysts in both kidneys, for which no specific follow-up imaging is recommended. The bladder appears unremarkable for its degree of distention.   Stomach/Bowel: There may be a small amount of enteric contrast in the mid small bowel. The stomach appears unremarkable for its degree of distension. No evidence of bowel wall thickening, distention or surrounding inflammatory change. The appendix is not clearly visualized and may be surgically absent. Descending and sigmoid diverticulosis without evidence of acute inflammation.   Vascular/Lymphatic: There are no enlarged abdominal or pelvic lymph nodes. Aortic and branch vessel atherosclerosis. No evidence of aneurysm.   Reproductive: The prostate gland and seminal vesicles appear  normal.   Other: Stable prominent fat in both inguinal canals. No evidence of ascites or pneumoperitoneum.   Musculoskeletal: No acute or significant osseous findings.   IMPRESSION: 1. No acute findings or explanation for the patient's symptoms. 2. Punctate nonobstructing right renal calculi. No evidence of ureteral calculus or hydronephrosis. 3. Distal colonic diverticulosis without evidence of acute inflammation. 4.  Aortic Atherosclerosis (ICD10-I70.0).     Electronically Signed   By: Elsie Perone M.D.   On: 02/09/2024 12:24  I personally reviewed the images referenced above and note no obstructing ureteral stones.  Assessment & Plan:   1. Flank pain with history of urolithiasis (Primary) No obstructing stones on stat CT.  We discussed differential including recently passed stone versus musculoskeletal back pain and unrelated microscopic hematuria. - Urinalysis, Complete - CT RENAL STONE STUDY; Future  2. Microhematuria With his extensive smoking history and BPH, I recommended a cystoscopy for further evaluation.  He is in agreement.  Return in about 2 weeks (around 02/23/2024) for Cysto with Dr. Twylla.  Lucie Hones, PA-C  [facility] [place] [address] [place], [place] [address] [phone]

## 2024-02-09 NOTE — Patient Instructions (Signed)

## 2024-02-13 ENCOUNTER — Ambulatory Visit: Admitting: Cardiovascular Disease

## 2024-03-08 ENCOUNTER — Other Ambulatory Visit: Payer: Self-pay

## 2024-03-08 ENCOUNTER — Encounter: Payer: Self-pay | Admitting: Cardiovascular Disease

## 2024-03-08 DIAGNOSIS — E785 Hyperlipidemia, unspecified: Secondary | ICD-10-CM

## 2024-03-08 DIAGNOSIS — I252 Old myocardial infarction: Secondary | ICD-10-CM

## 2024-03-08 DIAGNOSIS — I2510 Atherosclerotic heart disease of native coronary artery without angina pectoris: Secondary | ICD-10-CM

## 2024-03-08 DIAGNOSIS — I5020 Unspecified systolic (congestive) heart failure: Secondary | ICD-10-CM

## 2024-03-08 DIAGNOSIS — I480 Paroxysmal atrial fibrillation: Secondary | ICD-10-CM

## 2024-03-08 DIAGNOSIS — R0602 Shortness of breath: Secondary | ICD-10-CM

## 2024-03-08 MED ORDER — AMIODARONE HCL 200 MG PO TABS: ORAL_TABLET | ORAL | 0 refills | Status: DC

## 2024-03-15 ENCOUNTER — Other Ambulatory Visit: Admitting: Urology

## 2024-03-17 ENCOUNTER — Ambulatory Visit: Admitting: Urology

## 2024-03-17 ENCOUNTER — Encounter: Payer: Self-pay | Admitting: Urology

## 2024-03-17 VITALS — BP 120/78 | HR 56 | Ht 72.0 in | Wt 222.0 lb

## 2024-03-17 DIAGNOSIS — R3129 Other microscopic hematuria: Secondary | ICD-10-CM | POA: Diagnosis not present

## 2024-03-17 LAB — URINALYSIS, COMPLETE
Bilirubin, UA: NEGATIVE
Ketones, UA: NEGATIVE
Leukocytes,UA: NEGATIVE
Nitrite, UA: NEGATIVE
Protein,UA: NEGATIVE
Specific Gravity, UA: 1.03 (ref 1.005–1.030)
Urobilinogen, Ur: 0.2 mg/dL (ref 0.2–1.0)
pH, UA: 6 (ref 5.0–7.5)

## 2024-03-17 LAB — MICROSCOPIC EXAMINATION

## 2024-03-17 NOTE — Progress Notes (Signed)
   03/17/24  CC:  Chief Complaint  Patient presents with   Cysto    HPI: Refer to [facility]'s previous note 02/09/2024.  UA today 3 out of 10 RBCs on microscopy  Blood pressure 120/78, pulse (!) 56, height 6' (1.829 m), weight 222 lb (100.7 kg), SpO2 94%. NED. A&Ox3.   No respiratory distress   Abd soft, NT, ND Normal phallus with bilateral descended testicles  Cystoscopy Procedure Note  Patient identification was confirmed, informed consent was obtained, and patient was prepped using Betadine solution.  Lidocaine  jelly was administered per urethral meatus.     Pre-Procedure: - Inspection reveals a normal caliber urethral meatus.  Procedure: The flexible cystoscope was introduced without difficulty - No urethral strictures/lesions are present. - Moderate lateral lobe enlargement prostate  - Normal bladder neck - Bilateral ureteral orifices identified - Bladder mucosa  reveals no ulcers, tumors, or lesions - No bladder stones - No trabeculation  Retroflexion shows no intravesical median lobe/abnormalities   Post-Procedure: - Patient tolerated the procedure well  Assessment/ Plan: No bladder mucosal lesions/tumor Moderate BPH Follow-up visit 1 year with urinalysis   [name] JAYSON Barba, MD

## 2024-03-20 ENCOUNTER — Encounter: Payer: Self-pay | Admitting: Urology

## 2024-03-31 ENCOUNTER — Other Ambulatory Visit: Payer: Self-pay | Admitting: Cardiovascular Disease

## 2024-03-31 DIAGNOSIS — R0602 Shortness of breath: Secondary | ICD-10-CM

## 2024-03-31 DIAGNOSIS — I2510 Atherosclerotic heart disease of native coronary artery without angina pectoris: Secondary | ICD-10-CM

## 2024-03-31 DIAGNOSIS — I480 Paroxysmal atrial fibrillation: Secondary | ICD-10-CM

## 2024-03-31 DIAGNOSIS — E785 Hyperlipidemia, unspecified: Secondary | ICD-10-CM

## 2024-03-31 DIAGNOSIS — I5020 Unspecified systolic (congestive) heart failure: Secondary | ICD-10-CM

## 2024-03-31 DIAGNOSIS — I252 Old myocardial infarction: Secondary | ICD-10-CM

## 2024-06-09 ENCOUNTER — Encounter: Payer: Self-pay | Admitting: Cardiovascular Disease

## 2024-06-10 ENCOUNTER — Other Ambulatory Visit: Payer: Self-pay

## 2024-06-10 MED ORDER — SACUBITRIL-VALSARTAN 24-26 MG PO TABS: 1.0000 | ORAL_TABLET | Freq: Two times a day (BID) | ORAL | 3 refills | Status: AC

## 2024-06-23 ENCOUNTER — Emergency Department: Admission: EM | Admit: 2024-06-23 | Discharge: 2024-06-23 | Discharge status: Absent without leave

## 2025-03-22 ENCOUNTER — Ambulatory Visit: Admitting: Physician Assistant
